# Patient Record
Sex: Male | Born: 1935 | Race: Black or African American | Hispanic: No | Marital: Married | Smoking: Former smoker
Health system: Southern US, Community
[De-identification: ages and names within clinical notes are randomized; demographics above are authoritative.]

## PROBLEM LIST (undated history)

## (undated) DIAGNOSIS — E119 Type 2 diabetes mellitus without complications: Secondary | ICD-10-CM

## (undated) DIAGNOSIS — F0390 Unspecified dementia without behavioral disturbance: Secondary | ICD-10-CM

## (undated) DIAGNOSIS — Y92009 Unspecified place in unspecified non-institutional (private) residence as the place of occurrence of the external cause: Secondary | ICD-10-CM

## (undated) DIAGNOSIS — I10 Essential (primary) hypertension: Secondary | ICD-10-CM

## (undated) DIAGNOSIS — R413 Other amnesia: Secondary | ICD-10-CM

## (undated) DIAGNOSIS — G959 Disease of spinal cord, unspecified: Secondary | ICD-10-CM

## (undated) DIAGNOSIS — E785 Hyperlipidemia, unspecified: Secondary | ICD-10-CM

## (undated) DIAGNOSIS — I2510 Atherosclerotic heart disease of native coronary artery without angina pectoris: Secondary | ICD-10-CM

## (undated) DIAGNOSIS — W19XXXA Unspecified fall, initial encounter: Secondary | ICD-10-CM

## (undated) HISTORY — DX: Unspecified dementia, unspecified severity, without behavioral disturbance, psychotic disturbance, mood disturbance, and anxiety: F03.90

## (undated) HISTORY — PX: CATARACT EXTRACTION, BILATERAL: SHX1313

## (undated) HISTORY — DX: Other amnesia: R41.3

## (undated) HISTORY — DX: Essential (primary) hypertension: I10

## (undated) HISTORY — DX: Disease of spinal cord, unspecified: G95.9

## (undated) HISTORY — DX: Type 2 diabetes mellitus without complications: E11.9

## (undated) HISTORY — DX: Atherosclerotic heart disease of native coronary artery without angina pectoris: I25.10

## (undated) HISTORY — DX: Hyperlipidemia, unspecified: E78.5

---

## 2001-06-24 ENCOUNTER — Emergency Department (HOSPITAL_COMMUNITY): Admission: EM | Admit: 2001-06-24 | Discharge: 2001-06-24 | Payer: Self-pay | Admitting: Emergency Medicine

## 2001-06-24 ENCOUNTER — Encounter: Payer: Self-pay | Admitting: Emergency Medicine

## 2003-03-13 ENCOUNTER — Inpatient Hospital Stay (HOSPITAL_COMMUNITY): Admission: EM | Admit: 2003-03-13 | Discharge: 2003-03-15 | Payer: Self-pay | Admitting: Emergency Medicine

## 2003-03-13 ENCOUNTER — Encounter: Payer: Self-pay | Admitting: Cardiovascular Disease

## 2004-04-30 ENCOUNTER — Ambulatory Visit: Payer: Self-pay | Admitting: Cardiology

## 2004-05-06 ENCOUNTER — Ambulatory Visit: Payer: Self-pay | Admitting: Cardiology

## 2004-10-16 ENCOUNTER — Ambulatory Visit: Payer: Self-pay | Admitting: Cardiology

## 2004-10-20 ENCOUNTER — Ambulatory Visit: Payer: Self-pay | Admitting: Cardiology

## 2004-11-27 ENCOUNTER — Ambulatory Visit: Payer: Self-pay | Admitting: Cardiology

## 2005-06-08 ENCOUNTER — Ambulatory Visit: Payer: Self-pay | Admitting: Cardiology

## 2005-12-25 ENCOUNTER — Ambulatory Visit: Payer: Self-pay | Admitting: Cardiology

## 2005-12-29 ENCOUNTER — Ambulatory Visit: Payer: Self-pay | Admitting: Cardiology

## 2006-06-16 ENCOUNTER — Ambulatory Visit: Payer: Self-pay | Admitting: Cardiology

## 2006-08-27 ENCOUNTER — Ambulatory Visit (HOSPITAL_COMMUNITY): Admission: RE | Admit: 2006-08-27 | Discharge: 2006-08-27 | Payer: Self-pay | Admitting: Ophthalmology

## 2006-12-01 ENCOUNTER — Encounter (INDEPENDENT_AMBULATORY_CARE_PROVIDER_SITE_OTHER): Payer: Self-pay | Admitting: Ophthalmology

## 2006-12-01 ENCOUNTER — Ambulatory Visit (HOSPITAL_BASED_OUTPATIENT_CLINIC_OR_DEPARTMENT_OTHER): Admission: RE | Admit: 2006-12-01 | Discharge: 2006-12-01 | Payer: Self-pay | Admitting: Ophthalmology

## 2006-12-15 ENCOUNTER — Ambulatory Visit: Payer: Self-pay | Admitting: Cardiology

## 2006-12-22 ENCOUNTER — Ambulatory Visit: Payer: Self-pay | Admitting: Cardiology

## 2006-12-22 LAB — CONVERTED CEMR LAB
ALT: 24 units/L (ref 0–53)
Bilirubin, Direct: 0.1 mg/dL (ref 0.0–0.3)
Cholesterol: 143 mg/dL (ref 0–200)
HDL: 35.2 mg/dL — ABNORMAL LOW (ref >39.0)
LDL Cholesterol: 93 mg/dL (ref 0–99)
Total CHOL/HDL Ratio: 4.1
Total Protein: 7.2 g/dL (ref 6.0–8.3)
Triglycerides: 75 mg/dL (ref 0–149)

## 2007-06-23 ENCOUNTER — Ambulatory Visit: Payer: Self-pay | Admitting: Cardiology

## 2007-12-15 ENCOUNTER — Ambulatory Visit: Payer: Self-pay | Admitting: Cardiology

## 2008-04-20 ENCOUNTER — Ambulatory Visit: Payer: Self-pay | Admitting: Cardiology

## 2008-05-15 ENCOUNTER — Emergency Department (HOSPITAL_COMMUNITY): Admission: EM | Admit: 2008-05-15 | Discharge: 2008-05-15 | Payer: Self-pay | Admitting: Family Medicine

## 2008-06-05 ENCOUNTER — Encounter: Admission: RE | Admit: 2008-06-05 | Discharge: 2008-06-05 | Payer: Self-pay | Admitting: Family Medicine

## 2008-06-07 ENCOUNTER — Encounter: Admission: RE | Admit: 2008-06-07 | Discharge: 2008-07-31 | Payer: Self-pay | Admitting: Family Medicine

## 2008-07-23 ENCOUNTER — Telehealth: Payer: Self-pay | Admitting: Cardiology

## 2008-08-09 ENCOUNTER — Telehealth (INDEPENDENT_AMBULATORY_CARE_PROVIDER_SITE_OTHER): Payer: Self-pay

## 2008-08-13 ENCOUNTER — Ambulatory Visit: Payer: Self-pay | Admitting: Cardiology

## 2008-08-13 DIAGNOSIS — I2510 Atherosclerotic heart disease of native coronary artery without angina pectoris: Secondary | ICD-10-CM | POA: Insufficient documentation

## 2008-08-17 DIAGNOSIS — E119 Type 2 diabetes mellitus without complications: Secondary | ICD-10-CM | POA: Insufficient documentation

## 2008-08-17 LAB — CONVERTED CEMR LAB
ALT: 20 units/L (ref 0–53)
AST: 18 units/L (ref 0–37)
Calcium: 9 mg/dL (ref 8.4–10.5)
Cholesterol: 134 mg/dL (ref 0–200)
GFR calc non Af Amer: 106.65 mL/min (ref >60)
Glucose, Bld: 168 mg/dL — ABNORMAL HIGH (ref 70–99)
HDL: 46.5 mg/dL (ref >39.00)
Sodium: 142 meq/L (ref 135–145)
Total Bilirubin: 0.7 mg/dL (ref 0.3–1.2)
Total Protein: 6.7 g/dL (ref 6.0–8.3)
Triglycerides: 53 mg/dL (ref 0.0–149.0)

## 2008-08-18 DIAGNOSIS — I10 Essential (primary) hypertension: Secondary | ICD-10-CM | POA: Insufficient documentation

## 2008-08-18 DIAGNOSIS — E785 Hyperlipidemia, unspecified: Secondary | ICD-10-CM | POA: Insufficient documentation

## 2008-08-18 DIAGNOSIS — Z9849 Cataract extraction status, unspecified eye: Secondary | ICD-10-CM

## 2008-08-18 DIAGNOSIS — Z961 Presence of intraocular lens: Secondary | ICD-10-CM

## 2008-08-20 ENCOUNTER — Ambulatory Visit: Payer: Self-pay | Admitting: Cardiology

## 2008-11-07 ENCOUNTER — Telehealth: Payer: Self-pay | Admitting: Cardiology

## 2008-12-05 ENCOUNTER — Telehealth (INDEPENDENT_AMBULATORY_CARE_PROVIDER_SITE_OTHER): Payer: Self-pay | Admitting: *Deleted

## 2009-02-12 ENCOUNTER — Ambulatory Visit: Payer: Self-pay | Admitting: Cardiology

## 2009-07-23 ENCOUNTER — Telehealth: Payer: Self-pay | Admitting: Cardiology

## 2009-08-13 ENCOUNTER — Ambulatory Visit: Payer: Self-pay | Admitting: Cardiology

## 2009-08-20 ENCOUNTER — Ambulatory Visit: Payer: Self-pay | Admitting: Cardiology

## 2009-08-27 LAB — CONVERTED CEMR LAB
AST: 17 units/L (ref 0–37)
Albumin: 3.8 g/dL (ref 3.5–5.2)
Alkaline Phosphatase: 106 units/L (ref 39–117)
Calcium: 9.4 mg/dL (ref 8.4–10.5)
Chloride: 105 meq/L (ref 96–112)
Cholesterol: 132 mg/dL (ref 0–200)
Creatinine, Ser: 0.9 mg/dL (ref 0.4–1.5)
LDL Cholesterol: 76 mg/dL (ref 0–99)
Sodium: 143 meq/L (ref 135–145)
Total Protein: 7 g/dL (ref 6.0–8.3)
Triglycerides: 80 mg/dL (ref 0.0–149.0)

## 2010-02-25 ENCOUNTER — Ambulatory Visit: Payer: Self-pay | Admitting: Cardiology

## 2010-05-13 NOTE — Progress Notes (Signed)
Summary: Cough--pt not aware that daughter called  Phone Note Call from Patient Call back at [phone]   Caller: Daughter/[name] Reason for Call: Talk to Doctor Summary of Call: request to speak to Dr Riley Kill on the pts next step of care Initial call taken by: Migdalia Dk,  July 23, 2009 3:56 PM  Follow-up for Phone Call        The pt's daughter called our office because she is concerned about her dad's cough.  The pt has a cough that has continued to worsen.  Sometimes the pt coughs so bad that he sounds like he is vomiting.  The pt is not aware that his daughter called our office about this information.  I advised her that I cannot discuss this pt's medical condition with her.  I recommended that she speak with her dad and see is she can get him to schedule an appt with his PCP (Dr Blunt).  She agreed with plan. Follow-up by: Julieta Gutting, RN, BSN,  July 23, 2009 5:20 PM

## 2010-05-13 NOTE — Assessment & Plan Note (Signed)
Summary: f63m   Visit Type:  6 months follow up Primary Provider:  Blunt  CC:  No complaints.  History of Present Illness: Accompanied by wife.  She is concerned.  He on the other hand says that overall everything is good.  Denies chest pain.  No arm pain.  No exertional pressure.    Problems Prior to Update: 1)  Diab w/o Comp Type I [juv] Not Stated Uncntrl  (ICD-250.01) 2)  Myocardial Infarction, Acute, Non-q Wave, Hx of  (ICD-410.70) 3)  Hypertension  (ICD-401.9) 4)  Intraocular Lens Implant, Hx of  (ICD-V43.1) 5)  Cataract Extraction, Right Eye, Hx of  (ICD-V45.61) 6)  Cyst Excision Left Eye, Hx of  () 7)  Diabetes Mellitus  (ICD-250.00) 8)  Hyperlipidemia  (ICD-272.4) 9)  Coronary Atherosclerosis Native Coronary Artery  (ICD-414.01) 10)  Encounter For Long-term Use of Other Medications  (ICD-V58.69)  Current Medications (verified): 1)  Diovan 80 Mg Tabs (Valsartan) .... Take 1 Tablet By Mouth Once A Day 2)  Lipitor 80 Mg Tabs (Atorvastatin Calcium) .... Take 1 Tablet By Mouth Once A Day 3)  Zetia 10 Mg Tabs (Ezetimibe) .... Take 1 Tablet By Mouth Once A Day 4)  Aspirin 81 Mg Tabs (Aspirin) .... Take 1 Tablet By Mouth Once A Day 5)  Humalog Mix 75/25 Pen 75-25 % Susp (Insulin Lispro Prot & Lispro) .... Take As Directed Two Times A Day 6)  Metoprolol Tartrate 50 Mg Tabs (Metoprolol Tartrate) .... Take One Tablet By Mouth Twice A Day 7)  Plavix 75 Mg Tabs (Clopidogrel Bisulfate) .... Take One Tablet By Mouth Daily  Allergies (verified): No Known Drug Allergies  Past History:  Past Medical History: Last updated: 09/01/2008 MYOCARDIAL INFARCTION, ACUTE, NON-Q WAVE, HX OF (ICD-410.70) HYPERTENSION (ICD-401.9) DIABETES MELLITUS (ICD-250.00) HYPERLIPIDEMIA (ICD-272.4) CORONARY ATHEROSCLEROSIS NATIVE CORONARY ARTERY (ICD-414.01) ENCOUNTER FOR LONG-TERM USE OF OTHER MEDICATIONS (ICD-V58.69)    Past Surgical History: Last updated: 09-01-2008 INTRAOCULAR LENS IMPLANT, HX OF  (ICD-V43.1) CATARACT EXTRACTION, RIGHT EYE, HX OF (ICD-V45.61) * CYST EXCISION LEFT EYE, HX OF  Family History: Last updated: 09-01-08 Father:Died at age 28 unknown cause Mother:Died at 79 patient unsure of cause Siblings: 1 sister deceased in 72's of lung cancer ! sister living has hypertension  Vital Signs:  Patient profile:   76 year old male Height:      68 inches Weight:      222 pounds BMI:     33.88 Pulse rate:   68 / minute Pulse rhythm:   regular Resp:     18 per minute BP sitting:   140 / 80  (right arm) Cuff size:   large  Vitals Entered By: Vikki Ports (February 25, 2010 4:36 PM)  Physical Exam  General:  Well developed, well nourished, in no acute distress. Head:  normocephalic and atraumatic Eyes:  PERRLA/EOM intact; conjunctiva and lids normal. Lungs:  Clear bilaterally to auscultation and percussion. Heart:  PMI non displaced.  Normal S1 and S2.   Msk:  Back normal, normal gait. Muscle strength and tone normal. Pulses:  pulses normal in all 4 extremities Extremities:  No clubbing or cyanosis. Neurologic:  Alert and oriented x 3.   EKG  Procedure date:  02/25/2010  Findings:      NSR.  Nonspecific T wave abnormality.   Impression & Recommendations:  Problem # 1:  CORONARY ATHEROSCLEROSIS NATIVE CORONARY ARTERY (ICD-414.01) Seems to be stable overall.  Doing well.  Will continue to monitor.  He has been instructed to  exercise more.   His updated medication list for this problem includes:    Aspirin 81 Mg Tabs (Aspirin) .Marland Kitchen... Take 1 tablet by mouth once a day    Metoprolol Tartrate 50 Mg Tabs (Metoprolol tartrate) .Marland Kitchen... Take one tablet by mouth twice a day    Plavix 75 Mg Tabs (Clopidogrel bisulfate) .Marland Kitchen... Take one tablet by mouth daily  Problem # 2:  HYPERLIPIDEMIA (ICD-272.4) Continue current medical regimen.  His updated medication list for this problem includes:    Lipitor 80 Mg Tabs (Atorvastatin calcium) .Marland Kitchen... Take 1 tablet by mouth  once a day    Zetia 10 Mg Tabs (Ezetimibe) .Marland Kitchen... Take 1 tablet by mouth once a day  Problem # 3:  HYPERTENSION (ICD-401.9) under control at present time. His updated medication list for this problem includes:    Diovan 80 Mg Tabs (Valsartan) .Marland Kitchen... Take 1 tablet by mouth once a day    Aspirin 81 Mg Tabs (Aspirin) .Marland Kitchen... Take 1 tablet by mouth once a day    Metoprolol Tartrate 50 Mg Tabs (Metoprolol tartrate) .Marland Kitchen... Take one tablet by mouth twice a day  Patient Instructions: 1)  Your physician recommends that you schedule a follow-up appointment in: 6 MONTHS

## 2010-05-13 NOTE — Assessment & Plan Note (Signed)
Summary: f59m   Visit Type:  6 months follow up Primary Provider:  Blunt  CC:  No cardiac complains.  History of Present Illness: Patient thinks he is doing fine.  Not playing any golf.  No chest pain.  Just finished tax season.    Current Medications (verified): 1)  Diovan 80 Mg Tabs (Valsartan) .... Take 1 Tablet By Mouth Once A Day 2)  Lipitor 80 Mg Tabs (Atorvastatin Calcium) .... Take 1 Tablet By Mouth Once A Day 3)  Zetia 10 Mg Tabs (Ezetimibe) .... Take 1 Tablet By Mouth Once A Day 4)  Aspirin 81 Mg Tabs (Aspirin) .... Take 2 Tablets Daily 5)  Humalog Mix 75/25 Pen 75-25 % Susp (Insulin Lispro Prot & Lispro) .... Take As Directed Two Times A Day 6)  Metoprolol Tartrate 50 Mg Tabs (Metoprolol Tartrate) .... Take One Tablet By Mouth Twice A Day 7)  Plavix 75 Mg Tabs (Clopidogrel Bisulfate) .... Take One Tablet By Mouth Daily  Allergies (verified): No Known Drug Allergies  Past History:  Past Medical History: Last updated: 08/17/2008 MYOCARDIAL INFARCTION, ACUTE, NON-Q WAVE, HX OF (ICD-410.70) HYPERTENSION (ICD-401.9) DIABETES MELLITUS (ICD-250.00) HYPERLIPIDEMIA (ICD-272.4) CORONARY ATHEROSCLEROSIS NATIVE CORONARY ARTERY (ICD-414.01) ENCOUNTER FOR LONG-TERM USE OF OTHER MEDICATIONS (ICD-V58.69)    Past Surgical History: Last updated: 08/17/2008 INTRAOCULAR LENS IMPLANT, HX OF (ICD-V43.1) CATARACT EXTRACTION, RIGHT EYE, HX OF (ICD-V45.61) * CYST EXCISION LEFT EYE, HX OF  Vital Signs:  Patient profile:   76 year old male Height:      68 inches Weight:      217 pounds BMI:     33.11 Pulse rate:   68 / minute Pulse rhythm:   regular Resp:     18 per minute BP sitting:   116 / 64  (left arm) Cuff size:   large  Vitals Entered By: Vikki Ports (Aug 13, 2009 3:40 PM)  Physical Exam  General:  Well developed, well nourished, in no acute distress. Head:  normocephalic and atraumatic Eyes:  PERRLA/EOM intact; conjunctiva and lids normal. Lungs:  Clear  bilaterally to auscultation and percussion. Heart:  PMI non displaced.  Normal S1 and S2.  S4 gallop.   Abdomen:  Bowel sounds positive; abdomen soft and non-tender without masses, organomegaly, or hernias noted. No hepatosplenomegaly. Extremities:  No clubbing or cyanosis.  No edema. Neurologic:  Alert and oriented x 3.   EKG  Procedure date:  08/13/2009  Findings:      NSR.  Nonspecific T abnormality.  Impression & Recommendations:  Problem # 1:  CORONARY ATHEROSCLEROSIS NATIVE CORONARY ARTERY (ICD-414.01) Has underlying diabetes and difffuse disease noted in 2004.  Distal circ occluded, and scattered LAD disease.  Control of risk factors, and treatment of DM discussed in relation to disease progression.  Reinforced.  His updated medication list for this problem includes:    Aspirin 81 Mg Tabs (Aspirin) .Marland Kitchen... Take 2 tablets daily    Metoprolol Tartrate 50 Mg Tabs (Metoprolol tartrate) .Marland Kitchen... Take one tablet by mouth twice a day    Plavix 75 Mg Tabs (Clopidogrel bisulfate) .Marland Kitchen... Take one tablet by mouth daily  Orders: EKG w/ Interpretation (93000)  Problem # 2:  HYPERTENSION (ICD-401.9) currently controlled.  Appropriate medications at present.  controlled at present time.   His updated medication list for this problem includes:    Diovan 80 Mg Tabs (Valsartan) .Marland Kitchen... Take 1 tablet by mouth once a day    Aspirin 81 Mg Tabs (Aspirin) .Marland Kitchen... Take 2 tablets daily  Metoprolol Tartrate 50 Mg Tabs (Metoprolol tartrate) .Marland Kitchen... Take one tablet by mouth twice a day  Orders: EKG w/ Interpretation (93000)  Problem # 3:  DIABETES MELLITUS (ICD-250.00)  has not had HgbA1c, and not sure of control.  Suggested he had an A1c checked to assess long term control.  Patient says his primary wanted him to have labs here.  His updated medication list for this problem includes:    Diovan 80 Mg Tabs (Valsartan) .Marland Kitchen... Take 1 tablet by mouth once a day    Aspirin 81 Mg Tabs (Aspirin) .Marland Kitchen... Take 2  tablets daily    Humalog Mix 75/25 Pen 75-25 % Susp (Insulin lispro prot & lispro) .Marland Kitchen... Take as directed two times a day  Orders: EKG w/ Interpretation (93000)  Problem # 4:  HYPERLIPIDEMIA (ICD-272.4) Needs lipid and liver profile for reevaluation. needs lipid and liver profile.  His updated medication list for this problem includes:    Lipitor 80 Mg Tabs (Atorvastatin calcium) .Marland Kitchen... Take 1 tablet by mouth once a day    Zetia 10 Mg Tabs (Ezetimibe) .Marland Kitchen... Take 1 tablet by mouth once a day  Orders: EKG w/ Interpretation (93000)  Patient Instructions: 1)  Your physician recommends that you return for a FASTING LIPID, LIVER, BMP, HgbA1C (nothing to eat or drink after midnight) 414.01, 401.9, 250.0--lab opens at 8:30  2)  Your physician recommends that you continue on your current medications as directed. Please refer to the Current Medication list given to you today. 3)  Your physician wants you to follow-up in:   6 MONTHS. You will receive a reminder letter in the mail two months in advance. If you don't receive a letter, please call our office to schedule the follow-up appointment. Prescriptions: DIOVAN 80 MG TABS (VALSARTAN) Take 1 tablet by mouth once a day  #30 x 12   Entered by:   Julieta Gutting, RN, BSN   Authorized by:   Ronaldo Miyamoto, MD, [facility]   Signed by:   Julieta Gutting, RN, BSN on 08/13/2009   Method used:   Electronically to        [facility] [phone]* (retail)       [address]       [place], [place]  [address]       Ph: [phone]       Fax: [phone]   RxID:   [id number] LIPITOR 80 MG TABS (ATORVASTATIN CALCIUM) Take 1 tablet by mouth once a day  #30 x 12   Entered by:   Julieta Gutting, RN, BSN   Authorized by:   Ronaldo Miyamoto, MD, [facility]   Signed by:   Julieta Gutting, RN, BSN on 08/13/2009   Method used:   Electronically to        [facility] [phone]* (retail)       [address]       [place], [place]  [address]       Ph:  [phone]       Fax: [phone]   RxID:   [id number] ZETIA 10 MG TABS (EZETIMIBE) Take 1 tablet by mouth once a day  #30 x 12   Entered by:   Julieta Gutting, RN, BSN   Authorized by:   Ronaldo Miyamoto, MD, [facility]   Signed by:   Julieta Gutting, RN, BSN on 08/13/2009   Method used:   Electronically to        [facility] [phone]* (retail)  [address]       [place], [place]  [address]       Ph: [phone]       Fax: [phone]   RxID:   [id number] METOPROLOL TARTRATE 50 MG TABS (METOPROLOL TARTRATE) Take one tablet by mouth twice a day  #60 x 12   Entered by:   Julieta Gutting, RN, BSN   Authorized by:   Ronaldo Miyamoto, MD, [facility]   Signed by:   Julieta Gutting, RN, BSN on 08/13/2009   Method used:   Electronically to        [facility] [phone]* (retail)       [address]       [place], [place]  [address]       Ph: [phone]       Fax: [phone]   RxID:   [id number]

## 2010-07-05 ENCOUNTER — Other Ambulatory Visit: Payer: Self-pay | Admitting: Cardiology

## 2010-07-10 ENCOUNTER — Other Ambulatory Visit: Payer: Self-pay | Admitting: *Deleted

## 2010-07-10 MED ORDER — CLOPIDOGREL BISULFATE 75 MG PO TABS: 75.0000 mg | ORAL_TABLET | Freq: Every day | ORAL | Status: DC

## 2010-07-10 MED ORDER — VALSARTAN 80 MG PO TABS: 80.0000 mg | ORAL_TABLET | Freq: Every day | ORAL | Status: DC

## 2010-07-10 NOTE — Telephone Encounter (Signed)
[address] °

## 2010-08-25 ENCOUNTER — Other Ambulatory Visit: Payer: Self-pay | Admitting: Cardiology

## 2010-08-26 NOTE — Op Note (Signed)
NAME:  William Aguirre, William Aguirre NO.:  [id number]   MEDICAL RECORD NO.:  [id number]          PATIENT TYPE:  AMB   LOCATION:  [facility]                          FACILITY:  [facility]   PHYSICIAN:  Salley Scarlet., M.D.DATE OF BIRTH:  07-02-1934   DATE OF PROCEDURE:  12/01/2006  DATE OF DISCHARGE:  12/01/2006                               OPERATIVE REPORT   PREOPERATIVE DIAGNOSIS:  Multiple cysts upper lid left eye.   POSTOPERATIVE DIAGNOSIS:  Multiple cysts upper lid left eye.   OPERATION:  Cyst excision upper lid left eye.   ANESTHESIA:  Local using Xylocaine 1%.   PROCEDURE:  The patient was brought to the minor surgery room and the  upper and the patient was prepped and draped in the usual fashion.  The  upper lid was inspected and there were two cysts located along the  lateral aspect of the upper lid of the left eye.  The smaller cyst  measuring approximately 5 x 5 mm and the larger cyst measuring  approximately 2 cm x 20 mm x 8 mm.  The upper lid was infiltrated  several milliliters of Xylocaine.  The larger cyst was then incised in  toto by using sharp and blunt dissection.  The specimen was submitted  for pathology and the smaller cyst was then excised in the same fashion.  The skin was reapproximated by using interrupted sutures of 6-0 silk.  Polysporin ophthalmic ointment.  A pressure patch was applied.  The  patient tolerated the procedure well and was discharged to the post  anesthesia recovery satisfactory condition.  He was instructed to take  Darvocet every 4 hours as needed for pain, to remove the patch tomorrow,  to begin Polysporin ophthalmic ointment to the suture site, to see me in  the office in week for suture removal.  Discharged diagnosis:  Multiple  cysts upper lid left eye.      Salley Scarlet., M.D.  Electronically Signed     TB/MEDQ  D:  12/02/2006  T:  12/03/2006  Job:  [id number]

## 2010-08-26 NOTE — Assessment & Plan Note (Signed)
[facility]                            CARDIOLOGY OFFICE NOTE   William Aguirre, William Aguirre                      MRN:          [id number]  DATE:06/23/2007                            DOB:          09-18-1934    HISTORY:  Mr. Speciale is in for followup.  In general, he is doing well.  He denies any chest pain.  He is not exercising at all however.  He has  been working mostly in his [employer] business.   MEDICATIONS:  1. Baby aspirin 81 mg 2 tablets daily.  2. Plavix 75 mg daily.  3. Humalog in the morning and evening.  4. Diovan 80 mg daily.  5. Lipitor 80 mg q.h.s.  6. Zetia 10 mg q.h.s.  7. Metoprolol tartrate 50 mg p.o. b.i.d.   LABORATORY DATA:  His most recent laboratory studies were in September  of this year at which time his liver functions were normal.  His  cholesterol was 143 with an LDL of 93 and HDL of 35.   PHYSICAL EXAMINATION:  GENERAL:  He is alert and oriented.  VITAL SIGNS:  Weight is 224 pounds.  Blood pressure 138/82 and pulse is  64.  LUNGS:  Fields are clear to auscultation and percussion.  HEART:  His PMI is nondisplaced and there is no significant murmur  noted.  EXTREMITIES:  Reveal no edema.   DIAGNOSTICS:  Electrocardiogram demonstrates normal sinus rhythm with  minor nonspecific T-wave flattening.   IMPRESSION:  1. Known mild reduction in left ventricular function with an      inferobasal wall motion abnormality and total occlusion of the      distal circumflex prior to the posterior descending branch with      early collateralization.  2. Typical diabetic appearance of the coronary arteries.  3. Insulin-dependent diabetes mellitus.  4. Hypertension  5. Hyperlipidemia.   PLAN:  1. Return to clinic in 6 months.  2. Continue current medical regimen.  3. We discussed with him today goals with regard to both weight      reduction and exercise.     Arturo Morton. Riley Kill, MD, [facility]  Electronically Signed    TDS/MedQ   DD: 06/23/2007  DT: 06/25/2007  Job #: [id number]   cc:   A.V. Bruna Potter, MD

## 2010-08-26 NOTE — Assessment & Plan Note (Signed)
[facility]                            CARDIOLOGY OFFICE NOTE   William Aguirre, VINEY                      MRN:          [id number]  DATE:12/15/2007                            DOB:          11-11-1934    [name] is in for followup.  In general, he is doing well.  He does  a high level of exercise.  He gets little bit of angina, but he has  generally avoided any type of exercise.  He has 2 treadmills at home  which he virtually does not use.  His son tried to encourage him to use  it.  If he could gets some groceries and carries 2 heavy sacks to his  house.  He will get little bit of tightness.  He was not been in  advancement and discomfort, however.  He has had a hemoglobin A1c he  believes, but he has no idea what it is.   CURRENT MEDICATIONS:  1. Aspirin 81 mg 2 tablets daily.  2. Plavix 75 mg daily.  3. Diovan 80 mg daily.  4. Lipitor 80 mg at bedtime.  5. Zetia 10 mg at bedtime.  6. Metoprolol tartrate 50 mg p.o. b.i.d.  7. Humalog.   PHYSICAL EXAMINATION:  GENERAL:  He is alert and oriented.  VITAL SIGNS:  Blood pressure is 120/80, pulse is 66.  LUNGS:  Lung fields are clear.  CARDIAC:  Rhythm is regular.  He does not have a significant murmur.   EKG reveals normal sinus rhythm and minor nonspecific T-wave flattening.   IMPRESSION:  1. Scattered coronary artery disease typical of diabetes mellitus.  2. Insulin-dependent diabetes.  3. Hypertension.  4. Hyperlipidemia near, but not at target.   RECOMMENDATIONS:  1. Continue current medical regimen.  2. Continued followup of his lipid profile in our office in 6 months.   As an addendum, I talked to him at some length today about weight loss.     Arturo Morton. Riley Kill, MD, [facility]  Electronically Signed    TDS/MedQ  DD: 12/15/2007  DT: 12/16/2007  Job #: [id number]

## 2010-08-26 NOTE — Assessment & Plan Note (Signed)
[facility]                            CARDIOLOGY OFFICE NOTE   William Aguirre, William Aguirre                      MRN:          [id number]  DATE:12/15/2006                            DOB:          Aug 28, 1934    [name] is in for followup.  He is stable.  He has not been having  any ongoing chest pain or shortness of breath.  He really feels quite  well.  Overall, he has no specific complaints.   On physical examination today, the blood pressure is 132/72, the pulse  is 72.  The lung fields are clear, cardiac rhythm is regular, the  extremities reveal no edema.   The EKG reveals normal sinus rhythm, nonspecific T-wave abnormality.   IMPRESSION:  1. Coronary artery disease.  2. Insulin-requiring diabetes mellitus.  3. Hypercholesterolemia.   PLAN:  1. Return to clinic in 6 months.  2. Continue to encourage walking.  3. Lipid and liver profile at this time.     Arturo Morton. Riley Kill, MD, [facility]  Electronically Signed    TDS/MedQ  DD: 12/15/2006  DT: 12/15/2006  Job #: [id number]

## 2010-08-26 NOTE — Assessment & Plan Note (Signed)
[facility]                            CARDIOLOGY OFFICE NOTE   CLAIR, ALFIERI                      MRN:          [id number]  DATE:04/20/2008                            DOB:          05-12-1934    Mr. Woodmansee is in for followup.  He really is doing quite well from a  clinical cardiac standpoint.  He denies any chest pain or shortness of  breath.  He and I had an extensive discussion today regarding niacin,  Zetia, and Lipitor in light of recent trial results.  We also had an  extensive discussion about his weight, and dietary habits.   CURRENT MEDICATIONS:  1. Aspirin 81 mg 2 tablets daily.  2. Plavix 75 mg 1 daily.  3. Diovan 80 mg daily.  4. Lipitor 80 mg nightly.  5. Zetia 10 mg daily.  6. Metoprolol tartrate 50 mg p.o. b.i.d.  7. Humalog as directed.   PHYSICAL EXAMINATION:  GENERAL:  He is alert and oriented.  VITAL SIGNS:  Blood pressure was 160/80 and repeat was 150/80.  The  pulse is 64.  LUNGS:  The lung fields are clear.  CARDIAC:  Rhythm is regular.  Soft S4 gallop.   Electrocardiogram demonstrates normal sinus rhythm, nonspecific T-wave  abnormality.   IMPRESSION:  1. Insulin-dependent diabetes mellitus.  2. Well-defined coronary artery disease.  3. Hypercholesterolemia, on lipid-lowering therapy with LDL of 93 on      combination LDL-lowering treatment.  4. Moderate obesity.   DISPOSITION:  1. Extensive discussion about multiple drugs today and potential      options and treatment modalities.  2. Return to clinic in 6 months.  3. Lipid and liver profile.     Arturo Morton. Riley Kill, MD, [facility]  Electronically Signed    TDS/MedQ  DD: 04/20/2008  DT: 04/21/2008  Job #: [id number]

## 2010-08-29 NOTE — Discharge Summary (Signed)
NAME:  William Aguirre, William Aguirre NO.:  [id number]   MEDICAL RECORD NO.:  [id number]                   PATIENT TYPE:  INP   LOCATION:  4731                                 FACILITY:  [facility]   PHYSICIAN:  Norwood Levo, MD               DATE OF BIRTH:  1934-10-30   DATE OF ADMISSION:  03/12/2003  DATE OF DISCHARGE:  03/15/2003                                 DISCHARGE SUMMARY   PRIMARY CARE PHYSICIAN:  Clyda Greener, M.D.   Corinda Gubler CARDIOLOGIST:  Dr. Shawnie Pons.   CHIEF COMPLAINT:  Substernal chest pain, nausea, vomiting, and diarrhea.   HISTORY OF PRESENT ILLNESS:  A 76 year old African-American male with past  medical history significant for type 2 diabetes and hyperlipidemia.  Presented to the [facility] emergency department on November 29 with chief  complaint of substernal chest pressure. The patient had gone out the evening  previously and had some clam chowder and tossed salad. The afternoon of  November 24, he began to have some chest pain located in his epigastric  area. He belched multiple times and felt that the discomfort relieved. He  went to work the following day and had no complaints. Later that night, he  had several bouts of diarrhea, suffered nausea and vomiting. Later that  evening prior to admission, the chest pain returned. He awakened after going  to the bathroom and had another bout of diarrhea and more nausea and  vomiting, this time associated with substernal chest pain. Felt also  dyspepsia with what he called burning pain which he described as 4/10. He  had this intermittently over the period of 12 hours. Had felt that this was  probably indigestion but without resolution. EMS was called, and the  patient's was taken to the emergency room for further evaluation. Upon  arrival, he was chest pain free. He was placed on nitroglycerin drip and was  given a GI cocktail with resolution of his symptomatology.   PAST MEDICAL HISTORY:  1.  Diabetes type 2, insulin dependent, diagnosed in 1986.  2. Hyperlipidemia.   MEDICATIONS ON ADMISSION:  1. Humalog insulin 75/25 20 to 30 units subcu q.a.m. only.  2. Lipitor 20 mg p.o. q.h.s.  3. Multivitamin milkshake twice a day.   ALLERGIES:  No known drug allergies.   FAMILY HISTORY:  Father deceased at age 61 of unknown reasons. No history of  heart disease, stroke, or heart failure. Mother deceased at age 55 with no  cardiac history either. He has two sisters who have history of hypertension  and osteoarthritis and a third sister who died in her 59s secondary to lung  cancer.   SOCIAL HISTORY:  The patient is married, lives in [place], has four  children. He is a full time Airline pilot. Remote history of tobacco use for  about 30 years. Stopped drinking alcohol about 15 years ago.   REVIEW OF SYSTEMS:  Negative for abdominal pain, fevers, chills,  hematemesis, melena, hematochezia, dysuria, rash, swelling of the legs,  headache, weight loss, fevers, chills, nausea, or vomiting; other than what  is described in HPI.   PHYSICAL EXAMINATION:  On admission, as per Dr. Theodis Aguas HPE and is notable  only for obese abdomen without hepatosplenomegaly or masses. Exam is  otherwise normal.   LABORATORY EXAM AND DIAGNOSTIC TESTING:  EKG:  Normal sinus rhythm.  Nonspecific T-wave abnormality. Chest x-ray reveals bilateral atelectasis.  Quick cardiac enzyme sets done in the emergency room showed negative  troponins, CK-MBs, and myoglobins. WBC 8.9, hemoglobin 14.2, platelets 208.   ASSESSMENT/PLAN:  1. Cardiovascular. The patient is placed on telemetry. Has serial cardiac     enzymes continued which were found to be positive within 12 hours of     first evaluation. These were found to peak at his troponin of 3.43, CK at     559, and MB fraction at 41.0. Dr. Riley Kill from [facility] cardiology was     asked to evaluate patient. The patient is placed on Plavix, aspirin,     nitro  paste, intravenous fluids, and beta blockade. He is evaluated with     echocardiogram and shows an EF of 55 to 65% with no wall motion     abnormalities and no valvular disease. Left ventricular systolic function     which is normal and no effusions. Cardiac catheterization which follows     shows multivessel disease with 100% obstruction of the PDA and a range of     30 to 50% obstruction in the other major vessels. The patient was not     stented and did not undergo PTCA. There is no indication for CABG at this     time. The patient undergoes Integrilin overnight post cardiac     catheterization and then remains on Plavix, on aspirin, and Lopressor.     Homocysteine levels are normal. Total cholesterol is 133, triglycerides     68, and LDL is 87. HDL is not recorded at this time. Hemoglobin A1c is     not available at this time. The patient's urinalysis is also negative.     Hospital course is otherwise uneventful. The patient's dyspeptic-like     symptomatology is probably anginal equivalent. He is a diabetic. This is     not uncommon. He underwent cardiac rehab evaluation, Phase II. He also     had nutritional consultation for weight loss, Cardiac II diet, as well as     ADA 1,500 calorie diet.  2. Endocrinology. Insulin-dependent diabetes mellitus. The patient's glucose     control was adequate during this hospitalization, and he was continued on     the Humalog insulin as previously.   DISCHARGE DIAGNOSES:  1. Acute coronary syndrome with non-Q myocardial infarction.  2. Possible food poisoning.  3. Insulin-dependent diabetes mellitus.  4. Morbid obesity.  5. Hypertension.  6. Hyperlipidemia.   DISCHARGE MEDICATIONS:  1. Aspirin 81 mg p.o. daily.  2. Lipitor 40 mg p.o. q.h.s.  3. Lisinopril 10 mg p.o. daily.  4. Toprol-XL 100 mg p.o. daily.  5. Plavix 75 mg p.o. daily.  6. Humalog insulin 30 units subcu q.a.m. 7. Multivitamin one tablet p.o. daily.   DIETARY RESTRICTIONS:   Cardiac II diet and ADA 1,500 calorie diet.   FOLLOWUP:  1. Follow up with Dr. Riley Kill in two weeks. Call for appointment.  2. Follow up with Dr. Bruna Potter in two weeks. Call for appointment.  3. Continue to follow up with cardiac rehab as arranged with that service.                                                Norwood Levo, MD    APM/MEDQ  D:  03/15/2003  T:  03/15/2003  Job:  [id number]   cc:   Bruna Potter, M.D.   Arturo Morton. Riley Kill, M.D.

## 2010-08-29 NOTE — Assessment & Plan Note (Signed)
[facility]                              CARDIOLOGY OFFICE NOTE   William Aguirre, William Aguirre                      MRN:          [id number]  DATE:12/25/2005                            DOB:          Feb 12, 1935    Mr. Causer is in for followup.  Clinically he is doing really quite well.  He has not been having any ongoing chest pain or shortness of breath.  Unfortunately he has not been exercising very much.   MEDICATIONS:  His medications include:  1. Baby aspirin 81 mg, 2 tablets daily.  2. Toprol XL 100 mg daily.  3. Plavix 75 mg daily.  4. Humalog a.m. and p.m.  5. Diovan 80 mg daily.  6. Lipitor 80 mg, 1 q.h.s.  7. Zetia 10 mg daily.  8. 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   PHYSICAL EXAMINATION TODAY:  VITAL SIGNS:  Blood pressure 144/84.  Pulse 74.  LUNG FIELDS:  Clear.  CARDIAC EXAMINATION:  Reveals a nondisplaced PMI with normal first and  second heart sounds.  Soft S4.  No murmur.   Electrocardiogram demonstrates normal sinus rhythm.  Nonspecific ST-T  abnormality.  Compared to the previous tracing, there is no significant  interval change from February 2007.   IMPRESSION:  1. Coronary artery disease as demonstrated by previous cardiac      catheterization on March 14, 2003, with mild reduction in overall      left ventricular function with an inferobasal wall motion abnormality.      Total occlusion of distal circumflex prior to the posterior descending      branch with early collateralization and moderate disease of the LAD and      diagonal system with typical diabetic appearance.  2. Insulin-dependent diabetes mellitus.  3. Hypercholesterolemia.   PLAN:  1. Encouraged increased exercise.  2. Continue diabetic control.  3. Return to clinic in six months.  4. Lipid and liver profile.                              Arturo Morton. Riley Kill, MD, [facility]    TDS/MedQ  DD:  12/25/2005  DT:  12/26/2005  Job #:   [id number]

## 2010-08-29 NOTE — H&P (Signed)
NAME:  MAVEN, VARELAS NO.:  [id number]   MEDICAL RECORD NO.:  [id number]                   PATIENT TYPE:  INP   LOCATION:  1824                                 FACILITY:  [facility]   PHYSICIAN:  Elliot Cousin, M.D.                 DATE OF BIRTH:  Jul 26, 1934   DATE OF ADMISSION:  03/13/2003  DATE OF DISCHARGE:                                HISTORY & PHYSICAL   PRIMARY CARE PHYSICIAN:  Dr. Clyda Greener.   CHIEF COMPLAINT:  Substernal chest pain and nausea, vomiting and diarrhea.   HISTORY OF PRESENT ILLNESS:  Mr. Prien is a 76 year old man with a past  medical history significant for type 2 insulin-requiring diabetes mellitus  and hyperlipidemia, who presented to the emergency department on the night  of March 12, 2003 with a chief complaint of substernal chest pressure.  On March 11, 2003, the patient ate some clam chowder and a tossed salad.  During the night of the 28th, the patient began having chest pain which he  says was located in the center of his chest.  He belched a couple of times  and the chest pain went away.  During the following day, the patient went to  work and had no complaints.  However, last night he developed several  episodes of diarrhea and several episodes of nausea and vomiting.  The  nausea and vomiting and diarrhea subsided.  Later on in the night around 10  p.m., the chest pain came back.  This happened after he woke up and went to  the bathroom for another bout of diarrhea and one more episode of nausea and  vomiting.  The chest pain is located in the substernal area/center of his  chest.  He described the pain as burning.  The pain at it worst was a 4/10  in intensity.  It has been intermittent over the past 12 hours.  He does  have some feelings of indigestion and says that if he would just have a big  burp, he feels that the pain would go away.  The EMS arrived to the  patient's home.  During the evaluation by the  EMT, the patient's blood  pressure was 128/54, pulse rate of 80 and respiratory rate of 18.  He was  given three sublingual nitroglycerin and aspirin at 324 mg x1.  The chest  pain went from a 4/10 to a 1/10.  When the patient arrived to the emergency  department, he was virtually chest-pain-free.  The emergency department  physician placed him on a nitroglycerin drip.  The patient is currently  chest-pain-free.  In addition, the emergency department physician also gave  the patient a GI cocktail.  The patient has no complaints of chest pain,  nausea, vomiting or diarrhea at this time.   REVIEW OF SYSTEMS:  The patient's review of systems has been negative for  abdominal  pain, fever, chills, hematemesis, melena, hematochezia, dysuria,  rash, swelling in the legs, joint pain or numbness or tingling in the feet  or hands.  His review of systems is negative for headache, weight loss,  upper respiratory infection symptoms, chest pain, shortness of breath,  diaphoresis or radiation of the chest pain.  The patient has had no pleurisy  with the chest pain.   PAST MEDICAL HISTORY:  1. Type 2 diabetes mellitus, insulin requiring, diagnosed in 1986.  2. Hyperlipidemia.   MEDICATIONS:  1. Humalog insulin 75/25 -- 25 to 30 units subcu q.a.m.  2. Lipitor 20 mg nightly.  3. Multivitamin milkshake b.i.d.   ALLERGIES:  No known drug allergies.   FAMILY HISTORY:  The patient's father died at 54 years of age of unknown  causes.  No known history of heart disease, stroke or heart failure.  His  mother died at 20 years of age; he cannot recall the reason why.  She had no  history of heart failure, heart disease or stroke.  He had two sisters; one  is currently still living, she is 74 years old and has a history of  hypertension and osteoarthritis; he had one sister who died in her 17s  secondary to lung cancer.   SOCIAL HISTORY:  The patient is married and lives in [place] with  his wife.   He has four children.  He is employed full-time as an Airline pilot.  He has a remote history of tobacco use about 30 years ago.  He stopped  drinking alcohol about 15 years ago.  He can read and write.   PHYSICAL EXAMINATION:  GENERAL:  The patient is a pleasant, alert, obese 76-  year-old [place] American man who is currently lying in bed in no acute  distress.  VITAL SIGNS:  Temperature 97, blood pressure 108/58, pulse 77, respiratory  rate 16, oxygen saturation on room air 95.  HEENT:  Head is normocephalic and nontraumatic.  Pupils are equal, round and  reactive to light.  Extraocular movements are intact.  Conjunctivae are  clear.  Sclerae are white.  No proptosis or ptosis noted.  Tympanic  membranes are clear bilaterally.  Nasal mucosa is moist, no drainage.  Oropharynx reveals moist mucous membranes, no posterior exudates or  erythema.  His teeth are in good repair.  NECK:  His neck is supple.  No adenopathy, no thyromegaly, no bruit.  LUNGS:  Lungs are clear to auscultation bilaterally.  HEART:  S1 and S2 with a soft systolic murmur.  CHEST WALL:  There was no tenderness of his pectoralis muscles, no  tenderness of the substernal area.  ABDOMEN:  Positive bowel sounds.  Obese, nontender, nondistended.  No  hepatosplenomegaly.  No masses palpated.  GU AND RECTAL:  Deferred.  EXTREMITIES:  The patient has good pedal pulses.  No pretibial edema.  No  pedal edema.  He has good range of motion of all of his joints.  NEUROLOGIC:  The patient is alert and oriented x3.  Cranial nerves II-XII  are intact.  Strength is intact, upper and lower extremities, symmetrically.  Sensation is intact of the lower extremities.   ADMISSION LABORATORIES:  EKG revealed normal sinus rhythm, nonspecific T  wave abnormality.  Portable chest x-ray reveals bibasilar atelectasis.   CK-MB 1.5, myoglobin 64.7, troponin I less than 0.05; repeated CK-MB 1.1, repeated myoglobin 201, repeated troponin I less  than 0.05.  WBC 8.9,  hemoglobin 14.2, hematocrit 42.5, platelets 208,000.   ASSESSMENT:  1. Substernal chest pressure.  The differential diagnoses include     angina/unstable angina, gastroesophageal reflux disease, intrathoracic     gas, and possible chest wall pain.  The patient's chest pain was somewhat     relieved with nitroglycerin, however, his symptoms seem to be more     consistent with gastroesophageal reflux disease and indigestion.  The     patient certainly has risk factors for heart disease including     hyperlipidemia and type 2 diabetes mellitus, as well as male gender and     obesity.  His EKG is more or less unremarkable.  He does not appear to     have any substantial cardiomegaly on the chest x-ray.  2. Nausea, vomiting and diarrhea.  The patient's symptoms started several     hours after eating clam chowder.  The patient has no abdominal pain and     no abdominal cramping.  His symptoms may be related to an infectious     gastroenteritis from the clam chowder.  3. Type 2 insulin-requiring diabetes mellitus.  The patient states that his     blood sugars generally range in the 140s to 150s.  4. Hyperlipidemia.  The patient is being treated with Lipitor 20 mg nightly     in the outpatient setting.   PLAN:  1. The patient will be admitted to a telemetry bed.  Cardiac enzymes will be     ordered every 8 hours x3 to rule out a myocardial infarction.  2. The nitroglycerin, which is going at 20 mcg, will be titrated off and the     patient will be treated with Nitrol paste 1 inch every 6 hours.  The     Nitrol paste will need to be titrated up or down according to the     patient's pain, however, he is currently chest-pain-free.  3. We will treat pain additionally with Tylenol and morphine as needed.  4. We will place the patient on prophylactic Lovenox at 30 mg subcu q.12 h.  5. We will treat the patient empirically with Protonix 40 mg IV daily x48     hours and then 40  mg p.o. daily.  We will also give the patient Mylanta     30 mL t.i.d. x1 day, then p.r.n. thereafter.  6. We will decrease the dose of the patient's Humalog to 75/25 -- 10 units     subcu b.i.d.  We will monitor his blood sugars q.a.c. and nightly x24     hours and then q.a.m. and nightly thereafter.  We will give the patient     clear liquids for breakfast and then advance the diet as needed.  The     insulin will need to be titrated up with better p.o. intake.  7. We will add a CMET to the labs as well as adding an amylase and a lipase.     We will check a fasting lipid panel as well.  8. We will check a PT and PTT as well as a homocysteine level.  We will     check a hemoglobin A1c, TSH and free T4.  9. We will treat the patient's nausea as needed with Phenergan.  10.      We will obtain stool cultures for C&S, O&P and C. difficile.  We     will also guaiac the patient's stools. 11.      We will continue aspirin at 81 mg daily.  We will  check a 2-D     echocardiogram to evaluate the patient's chest pain.  12.      We will consider a cardiology consult for further evaluation.                                                Elliot Cousin, M.D.    DF/MEDQ  D:  03/13/2003  T:  03/13/2003  Job:  [id number]   cc:   Franco Nones.D.

## 2010-08-29 NOTE — Assessment & Plan Note (Signed)
[facility]                            CARDIOLOGY OFFICE NOTE   CYNCERE, SONTAG                      MRN:          [id number]  DATE:06/16/2006                            DOB:          [birth year]    Mr. Mcclaren is in for followup. He is doing well. He check his blood  sugar every morning. He has not been exercising however. He has stayed  busy during tax season.   CURRENT MEDICATIONS:  1. Baby aspirin 81 mg daily.  2. Toprol XL 100 mg daily.  3. Plavix 75 mg daily.  4. Diovan 80 mg daily.  5. Lipitor 80 mg p.o. q nightly.  6. Zetia 10 mg q nightly.  7. Humalog insulin.   PHYSICAL EXAMINATION:  He is alert and oriented. Weight is 219 pounds.  Blood pressure is 142/90, pulse 63.  Jugular veins not distended. Carotid upstrokes brisk.  Lung fields are clear to auscultation and percussion.  PMI nondisplaced. Normal 1st and 2nd heart sounds. No extremity edema.   EKG: Reveals normal sinus rhythm, nonspecific T-wave abnormality.   IMPRESSION:  1. Coronary artery disease.  2. Insulin-dependent diabetes mellitus.  3. Hypercholesterolemia.   PLAN:  1. Continue current medical regimen.  2. Lipid and liver profile in six months.  3. Encouraged the patient to walk regularly.     Arturo Morton. Riley Kill, MD, [facility]  Electronically Signed    TDS/MedQ  DD: 06/16/2006  DT: 06/16/2006  Job #: [id number]

## 2010-08-29 NOTE — Cardiovascular Report (Signed)
NAME:  MANCIL, PFENNING NO.:  [id number]   MEDICAL RECORD NO.:  [id number]                   PATIENT TYPE:  INP   LOCATION:  4731                                 FACILITY:  [facility]   PHYSICIAN:  Arturo Morton. Riley Kill, M.D.             DATE OF BIRTH:  18-May-1934   DATE OF PROCEDURE:  03/14/2003  DATE OF DISCHARGE:                              CARDIAC CATHETERIZATION   INDICATIONS:  [name] is a very delightful 76 year old gentleman who  presented with chest pain.  Enzymes were initially negative but subsequently  turned positive.  He had no electrocardiographic abnormalities.  He was  subsequently brought to the cath lab after treatment with enoxaparin and  glycoprotein inhibitors.  The catheterization was done for non-ST-elevation  myocardial infarction in an insulin-dependent diabetic.   PROCEDURE:  1. Left heart catheterization.  2. Selective coronary arteriography.  3. Selective left ventriculography.   DESCRIPTION OF THE PROCEDURE:  The procedure was performed from the right  femoral artery using 6-French catheters.  He tolerated the procedure well,  although he had a fair amount of back discomfort on the table; fentanyl and  Versed were administered to try to improve on his symptoms.  He was  subsequently taken to the holding area in satisfactory clinical condition.  There were no complications noted during the course of the procedure.   HEMODYNAMIC DATA:  1. Central aortic pressure 112/74, mean 90.  2. Left ventricle 117/25.  3. No gradient on pullback across the aortic valve.   ANGIOGRAPHIC DATA:  1. Ventriculography was performed in the RAO projection.  Overall systolic     function was mildly reduced.  Ejection fraction was calculated at 47% but     appeared to be visually better.  There was hypokinesis of the inferobasal     segment.  No significant mitral regurgitation was noted.  2. The left main coronary artery was free of critical  disease.  3. The left anterior descending artery coursed to the apex.  The LAD and its     branch had a fairly typical diabetic appearance with multiple areas of     luminal irregularity.  Beyond the origin of the major diagonal branch was     a 40-50% area of focal stenosis that was slightly hypodense that appeared     to be less severe in some views than others.  There was also a 30-40%     ostial narrowing of the major diagonal branch.  There was moderate     plaquing noted at the apical portion of the LAD as well; critical     stenoses, however, were not noted.  4. The circumflex is a dominant vessel.  There is a first marginal branch     that has a typical diabetic appearance and a focal area of about 60%     narrowing.  There is also a second marginal branch that  bifurcates     distally and again has diffuse luminal irregularities compatible with     diabetes.  The A-V circumflex courses to the posterior wall, where it     supplies two posterolateral branches and then is totally occluded,     functioning as a posterior descending branch.  In the initial RAO films,     there was evidence of some collateralization of the distal PDA, and this     had an appearance of being somewhat diffusely irregular.  5. The right coronary artery was a nondominant vessel that was free of     critical disease.   CONCLUSIONS:  1. Mild reduction in left ventricular function with an inferobasal wall     motion abnormality but with hypokinesis and not akinesis.  2. Currently normal electrocardiogram.  3. Total occlusion of the distal circumflex prior to the posterior     descending branch with early collateralization of this vessel.  4. Moderate disease of the left anterior descending and diagonal system.  5. Typical diabetic appearance of the coronary arteries.   RECOMMENDATIONS:  Overall, the patient has no current symptoms.  His  electrocardiogram is normal.  The circumflex occlusion appears to be  distal,  and based upon the diabetic appearance of the remaining vessels, it seems it  would be optimal to recommend medical therapy at this point.  Percutaneous  intervention would be recommended for recurrent continued symptoms.  The  patient stopped his antiplatelet therapy earlier in the summer and is an  insulin-dependent diabetic.  Resumption of antiplatelet therapy including  aspirin and clopidogrel would be recommended.  In addition, initiation of  cardiac rehabilitation, weight loss, improved insulin sensitivity, and  aggressive statin therapy would be optimal for this patient.  He will need  continued followup in the cardiology clinic.                                               Arturo Morton. Riley Kill, M.D.    TDS/MEDQ  D:  03/14/2003  T:  03/14/2003  Job:  [id number]   cc:   [facility]   Dr. Almyra Deforest, M.D.

## 2010-08-29 NOTE — Consult Note (Signed)
NAME:  William Aguirre, William Aguirre NO.:  [id number]   MEDICAL RECORD NO.:  [id number]                   PATIENT TYPE:  INP   LOCATION:  4731                                 FACILITY:  [facility]   PHYSICIAN:   Bing, M.D.               DATE OF BIRTH:  September 18, 1934   DATE OF CONSULTATION:  03/13/2003  DATE OF DISCHARGE:                                   CONSULTATION   CARDIOLOGY CONSULTATION   REFERRING PHYSICIAN:  Elliot Cousin, M.D.   PRIMARY CARE PHYSICIAN:  Renaye Rakers, M.D.   HISTORY OF PRESENT ILLNESS:  The patient is a 76 year old gentleman admitted  to [facility] 12 hours ago with chest discomfort and now  found to have positive cardiac markers.   The patient has no prior history of coronary disease.  He does have multiple  cardiovascular risk factors including diabetes and hyperlipidemia, both of  them well managed medically.  Over the past few days the patient has noted a  number of episodes of relatively minor chest burning discomfort without  radiation, without dyspnea and without diaphoresis.  There was associated  nausea.  The first two episodes lasted for a number of minutes to an hour or  two and resolved spontaneously.  The third episode prompted EMS to be  summoned.  Sublingual nitroglycerin was given with improvement.  There was  some recurrent discomfort upon arrival at the hospital and subsequently.  The patient has felt well over the past few hours.   The patient developed diabetes approximately 18 years ago.  He has somewhat  variable control at home.  His treatment regimen includes insulin.  Hyperlipidemia is treated with atorvastatin 20 mg daily.  He was taking  daily aspirin in the past but has not done so for approximately 4 months.  He is taking no other medications.  He is unaware of any medical allergies.   PAST MEDICAL HISTORY:  Otherwise unremarkable.  The patient has never  undergone surgery.  He has not  had any significant previous cardiac testing.   SOCIAL HISTORY:  Lives in [place] with his wife. He works as an  Airline pilot.  Occasionally plays golf.  Discontinued cigarette smoking 30  years ago after a total consumption of less than 10 pack years.  Denies  excessive use of alcohol.  He walks regularly.   FAMILY HISTORY:  Illnesses of his parents are unknown.  He has a sister with  hypertension and a sister who died from lung cancer.   REVIEW OF SYMPTOMS:  Notable for the need for corrective lenses; good  dentition.  Occasional nausea, emesis and diarrhea.  All other systems  negative.   PHYSICAL EXAMINATION:  GENERAL APPEARANCE:  Pleasant, well-appearing  gentleman.  VITAL SIGNS:  Temperature 98.  Heart rate 90 and regular.  Respirations 20.  Blood pressure 105/65.  Weight 220.  Oxygen saturation 96% on 2  liters.  SKIN:  Hyperpigmentation over the left face.  HEENT:  Anicteric sclerae.  NECK:  No jugular venous distention; no carotid bruits.  ENDOCRINE:  No thyromegaly.  HEMATOPOIETIC:  No adenopathy.  NEUROMUSCULAR:  Symmetric strength and tone.  MUSCULOSKELETAL:  Supple neck.  Full range of motion in all joints.  LUNGS:  Clear.  CARDIAC:  Normal 1st and 2nd heart sounds; fourth heart sound present.  ABDOMEN:  Soft, nontender; no bruits; no organomegaly.  EXTREMITIES:  Normal distal pulses; no edema.   LABORATORY DATA:  Electrocardiogram normal sinus rhythm; nonspecific ST-T  wave abnormalities.   Initial total CPK 327 with MB fraction of 31.  Troponin 1.1.  TSH 0.09.  Hemoglobin A1C 8.1%.   IMPRESSION AND PLAN:  Mr. Mathew presents with acute coronary syndrome as  documented by minor electrocardiogram abnormalities and abnormal cardiac  markers.  Although the quality of his chest discomfort was somewhat  atypical, it is certainly compatible with myocardial ischemia.  Since he has  had continuing symptoms his medical regime will be intensified with the  addition  of full dose low molecular weight heparin, metoprolol, intravenous  nitroglycerin and clopidogrel.  Lipid profile will be assessed to determine  if he needs additional lipid-lowering therapy.  More precise control of  diabetes will be desirable.  The risks and benefits of coronary angiography  and possible percutaneous coronary intervention were discussed with the  patient who agrees to proceed.  Coronary angiography will be performed in  the A.M., sooner if chest discomfort recurs.  The patient has been referred  to our research nurses for possible inclusion in one of our protocols.                                               [place] Bing, M.D.    RR/MEDQ  D:  03/13/2003  T:  03/13/2003  Job:  [id number]

## 2010-08-29 NOTE — Op Note (Signed)
NAME:  JAMAS, JAQUAY NO.:  [id number]   MEDICAL RECORD NO.:  [id number]          PATIENT TYPE:  AMB   LOCATION:  SDS                          FACILITY:  [facility]   PHYSICIAN:  Salley Scarlet., M.D.DATE OF BIRTH:  25-Sep-1934   DATE OF PROCEDURE:  08/27/2006  DATE OF DISCHARGE:  08/27/2006                               OPERATIVE REPORT   PREOPERATIVE DIAGNOSIS:  Immature cataract, right eye.   POSTOPERATIVE DIAGNOSIS:  Immature cataract, right eye.   OPERATION:  Kelman phacoemulsification of cataract, right eye.   ANESTHESIA:  Local using Xylocaine 2%, Marcaine 0.75%, and Wydase.   JUSTIFICATION FOR PROCEDURE:  This is a 76 year old male diabetic who  complained of blurred vision with difficulty seeing from the right eye.  He was evaluated and found to have a visual acuity of light position on  the right and 20/25 on the left.  There was a dense posterior  subcapsular cataract of the right eye with a lesser cataract of the left  eye.  Cataract extraction of the right eye was recommended with  intraocular lens implantation.  He is admitted at this time for that  purpose.   PROCEDURE:  Under the influence of IV sedation, a Van Lint akinesia and  retrobulbar anesthesia was given.  The patient was prepped and draped in  the usual manner.  The lid speculum was inserted under the upper and  lower lid of the right eye and a 4-0 silk traction suture was passed  through the belly of the superior rectus muscle for retraction.  A  fornix-based conjunctival flap was turned and hemostasis was achieved  using cautery.  An incision was made in the sclera at the limbus.  This  incision was dissected down to clear cornea using a crescent blade.  A  sideport incision was made at the 1:30 clock hour position.  Ocucoat was  injected into the eye through the sideport incision.  The anterior  chamber was then entered through the corneoscleral tunnel incision at  11:30 clock  hour position.  An anterior capsulotomy was done using a  bent 25-gauge needle.  The nucleus was hydrodissected using Xylocaine.  The KPE handpiece was passed into the eye and the nucleus was emulsified  without difficulty.  The residual cortical material was aspirated.  The  posterior capsule was polished using an olive-tip polisher.  The wound  was widened slightly to accommodate a foldable silicone lens.  The lens  was seated into the eye behind the iris without difficulty.  The  anterior chamber was reformed and the pupil was constricted using  Miochol.  The residual Ocucoat was aspirated from the eye.  The lips of  the wound were hydrated and tested to make sure that there was no leak.  After ascertaining that there was no leak, the conjunctiva was closed  over the wound using thermal cautery.  Celestone 1 mL and 0.5 mL cc of  gentamicin were injected subconjunctivally.  Maxitrol ophthalmic  ointment and Pilopine ointment were applied along with a patch and Fox  shield.  The patient tolerated the  procedure well and was  discharged to the post anesthesia recovery room in satisfactory  condition.  He is instructed to rest today, to take Vicodin every 4  hours as needed, and to see me in the office tomorrow for further  evaluation.   DISCHARGE DIAGNOSIS:  Immature cataract, right eye thyroid.      Salley Scarlet., M.D.  Electronically Signed     TB/MEDQ  D:  08/28/2006  T:  08/28/2006  Job:  [id number]

## 2010-09-07 ENCOUNTER — Other Ambulatory Visit: Payer: Self-pay | Admitting: Cardiology

## 2010-09-15 ENCOUNTER — Encounter: Payer: Self-pay | Admitting: Cardiology

## 2010-09-27 ENCOUNTER — Other Ambulatory Visit: Payer: Self-pay | Admitting: Cardiology

## 2010-10-01 ENCOUNTER — Telehealth: Payer: Self-pay | Admitting: Cardiology

## 2010-10-01 MED ORDER — ATORVASTATIN CALCIUM 80 MG PO TABS: 80.0000 mg | ORAL_TABLET | Freq: Every day | ORAL | Status: DC

## 2010-10-01 NOTE — Telephone Encounter (Signed)
Pt calling re ok if he gets generic lipitor [phone] or [phone] ok to leave message

## 2010-10-01 NOTE — Telephone Encounter (Signed)
Will review with Dr. Riley Kill.

## 2010-10-01 NOTE — Telephone Encounter (Signed)
Informed patient that it should be ok to switch to the generic of Lipitor.

## 2011-03-03 ENCOUNTER — Telehealth: Payer: Self-pay | Admitting: Cardiology

## 2011-03-03 MED ORDER — METOPROLOL TARTRATE 50 MG PO TABS: 50.0000 mg | ORAL_TABLET | Freq: Two times a day (BID) | ORAL | Status: DC

## 2011-03-03 NOTE — Telephone Encounter (Signed)
New message Refill of metoprolol.   Please give them DEA #

## 2011-03-31 ENCOUNTER — Ambulatory Visit (INDEPENDENT_AMBULATORY_CARE_PROVIDER_SITE_OTHER): Payer: No Typology Code available for payment source | Admitting: Cardiology

## 2011-03-31 ENCOUNTER — Encounter: Payer: Self-pay | Admitting: Cardiology

## 2011-03-31 DIAGNOSIS — I2510 Atherosclerotic heart disease of native coronary artery without angina pectoris: Secondary | ICD-10-CM

## 2011-03-31 DIAGNOSIS — E785 Hyperlipidemia, unspecified: Secondary | ICD-10-CM

## 2011-03-31 DIAGNOSIS — I10 Essential (primary) hypertension: Secondary | ICD-10-CM

## 2011-03-31 MED ORDER — CLOPIDOGREL BISULFATE 75 MG PO TABS: 75.0000 mg | ORAL_TABLET | Freq: Every day | ORAL | Status: AC

## 2011-03-31 MED ORDER — EZETIMIBE 10 MG PO TABS: 10.0000 mg | ORAL_TABLET | Freq: Every day | ORAL | Status: DC

## 2011-03-31 MED ORDER — ATORVASTATIN CALCIUM 80 MG PO TABS: 80.0000 mg | ORAL_TABLET | Freq: Every day | ORAL | Status: DC

## 2011-03-31 MED ORDER — VALSARTAN 80 MG PO TABS: 80.0000 mg | ORAL_TABLET | Freq: Every day | ORAL | Status: DC

## 2011-03-31 MED ORDER — METOPROLOL TARTRATE 50 MG PO TABS: 50.0000 mg | ORAL_TABLET | Freq: Two times a day (BID) | ORAL | Status: DC

## 2011-03-31 NOTE — Patient Instructions (Signed)
Your physician wants you to follow-up in: 6 MONTHS.  You will receive a reminder letter in the mail two months in advance. If you don't receive a letter, please call our office to schedule the follow-up appointment.  Your physician recommends that you continue on your current medications as directed. Please refer to the Current Medication list given to you today.  

## 2011-03-31 NOTE — Progress Notes (Signed)
   HPI:  Mr. Hoopes is in for follow up.  He is stable at the present time.  He has not been having any chest pain or shortness of breath.  His insulin therapy has been intensified.  He has had multiple tubes of labs drawn recently, and told things are ok.  We will try to get those from Dr. Clide Deutscher.   Current Outpatient Prescriptions  Medication Sig Dispense Refill  . atorvastatin (LIPITOR) 80 MG tablet Take 1 tablet (80 mg total) by mouth daily.  30 tablet  6  . clopidogrel (PLAVIX) 75 MG tablet Take 1 tablet (75 mg total) by mouth daily.  30 tablet  11  . DIOVAN 80 MG tablet TAKE ONE TABLET BY MOUTH EVERY DAY  30 each  6  . FREESTYLE LITE test strip       . metoprolol (LOPRESSOR) 50 MG tablet Take 1 tablet (50 mg total) by mouth 2 (two) times daily.  60 tablet  5  . NOVOLIN 70/30 (70-30) 100 UNIT/ML injection Inject 20 Units into the skin 2 (two) times daily with a meal. 10 units in the morning and depends what the last glucose reading is between 10 and 20 units at nite      . NOVOLOG 100 UNIT/ML injection Inject 14 Units into the skin 3 (three) times daily before meals.       Marland Kitchen ZETIA 10 MG tablet TAKE ONE TABLET BY MOUTH EVERY DAY  30 each  6    No Known Allergies  No past medical history on file.  No past surgical history on file.  No family history on file.  History   Social History  . Marital Status: Married    Spouse Name: N/A    Number of Children: N/A  . Years of Education: N/A   Occupational History  . Not on file.   Social History Main Topics  . Smoking status: Never Smoker   . Smokeless tobacco: Not on file  . Alcohol Use: Not on file  . Drug Use: Not on file  . Sexually Active: Not on file   Other Topics Concern  . Not on file   Social History Narrative  . No narrative on file    ROS: Please see the HPI.  All other systems reviewed and negative.  PHYSICAL EXAM:  BP 142/80  Pulse 64  Ht 5' 8.5" (1.74 m)  Wt 100.154 kg (220 lb 12.8 oz)  BMI 33.08  kg/m2  General: Well developed, well nourished, in no acute distress. Head:  Normocephalic and atraumatic. Neck: no JVD Lungs: Clear to auscultation and percussion. Heart: Normal S1 and S2.  No murmur, rubs or gallops.  Abdomen:  Normal bowel sounds; soft; non tender; no organomegaly Pulses: Pulses normal in all 4 extremities. Extremities: No clubbing or cyanosis. No edema. Neurologic: Alert and oriented x 3.  EKG:  NSR.  WNL.  ASSESSMENT AND PLAN:

## 2011-03-31 NOTE — Assessment & Plan Note (Signed)
Systolic minimally elevated, but at the present office visit only.  Home ambulatory recordings not known.

## 2011-03-31 NOTE — Assessment & Plan Note (Signed)
He continues to remain stable.  I explained to him the results of FREEDOM.  He is not having any current symptoms, but I have carefully explained to him that should he have a change in functional status he should notify us promptly.

## 2011-03-31 NOTE — Assessment & Plan Note (Signed)
Get labs from Dr. Fleeta Emmer office and assess.

## 2011-08-03 ENCOUNTER — Telehealth: Payer: Self-pay | Admitting: Cardiology

## 2011-08-03 NOTE — Telephone Encounter (Signed)
I spoke with the pt and he threw the letter that he received from the insurance company into his trash can at work today.  The pt does remember seeing a list of covered medications on letter.  The pt will see if letter is still in his trash can tomorrow.  If it is not then he will contact his insurance company.  The pt will call me with the list of covered medications.

## 2011-08-03 NOTE — Telephone Encounter (Signed)
Please return call to patient regarding mediation, he can be reached at wk# [phone]   Patient received letter from insurance regarding request to switch Diovan and zetia RX to generics.

## 2011-08-04 NOTE — Telephone Encounter (Signed)
F/U-please return call to patient at [phone]  Patient called to report the generic for Diovan is valsartan, please return call to patient at [phone] ext [phone]

## 2011-08-28 NOTE — Telephone Encounter (Signed)
Per Dr Riley Kill if Diovan is not approved then the pt should start Losartan 25mg  once a day and have a BMP checked in 10 days.

## 2011-09-02 NOTE — Telephone Encounter (Signed)
I spoke with [facility] and they show that Diovan and Zetia are still approved by the insurance. The pharmacist ran both of these medications through the system while I was on the phone and they were approved. I spoke with the pt and made him aware that he should continue both of these medications at this time.  If the pt has problems in the future with Diovan then he can switch to Losartan per Dr Rosalyn Charters previous recommendation.

## 2011-09-30 ENCOUNTER — Ambulatory Visit (INDEPENDENT_AMBULATORY_CARE_PROVIDER_SITE_OTHER): Payer: Medicare HMO | Admitting: Cardiology

## 2011-09-30 ENCOUNTER — Encounter: Payer: Self-pay | Admitting: Cardiology

## 2011-09-30 VITALS — BP 138/80 | HR 73 | Ht 68.0 in | Wt 216.0 lb

## 2011-09-30 DIAGNOSIS — I10 Essential (primary) hypertension: Secondary | ICD-10-CM

## 2011-09-30 DIAGNOSIS — E119 Type 2 diabetes mellitus without complications: Secondary | ICD-10-CM

## 2011-09-30 DIAGNOSIS — I2510 Atherosclerotic heart disease of native coronary artery without angina pectoris: Secondary | ICD-10-CM

## 2011-09-30 DIAGNOSIS — E785 Hyperlipidemia, unspecified: Secondary | ICD-10-CM

## 2011-09-30 NOTE — Patient Instructions (Addendum)
Your physician wants you to follow-up in: 6 MONTHS.  You will receive a reminder letter in the mail two months in advance. If you don't receive a letter, please call our office to schedule the follow-up appointment.  Your physician recommends that you continue on your current medications as directed. Please refer to the Current Medication list given to you today.  

## 2011-10-07 NOTE — Progress Notes (Signed)
HPI:  The patient continues get along well. He denies any major chest pain. He does remain on aggressive lipid-lowering therapy.  He was an insulin-dependent diabetic, and has diffuse coronary artery disease. We have reviewed the results of the Freedom trial for the past and also the current treatment strategy.  His last cath was in 2004 and revealed the following findings:  CONCLUSIONS:  1. Mild reduction in left ventricular function with an inferobasal wall  motion abnormality but with hypokinesis and not akinesis.  2. Currently normal electrocardiogram.  3. Total occlusion of the distal circumflex prior to the posterior  descending branch with early collateralization of this vessel.  4. Moderate disease of the left anterior descending and diagonal system.  5. Typical diabetic appearance of the coronary arteries.  RECOMMENDATIONS: Overall, the patient has no current symptoms. His  electrocardiogram is normal. The circumflex occlusion appears to be distal,  and based upon the diabetic appearance of the remaining vessels, it seems it  would be optimal to recommend medical therapy at this point. Percutaneous  intervention would be recommended for recurrent continued symptoms. The  patient stopped his antiplatelet therapy earlier in the summer and is an  insulin-dependent diabetic. Resumption of antiplatelet therapy including  aspirin and clopidogrel would be recommended. In addition, initiation of  cardiac rehabilitation, weight loss, improved insulin sensitivity, and  aggressive statin therapy would be optimal for this patient. He will need  continued followup in the cardiology clinic.    Current Outpatient Prescriptions  Medication Sig Dispense Refill  . atorvastatin (LIPITOR) 80 MG tablet Take 1 tablet (80 mg total) by mouth daily.  30 tablet  11  . clopidogrel (PLAVIX) 75 MG tablet Take 1 tablet (75 mg total) by mouth daily.  30 tablet  11  . ezetimibe (ZETIA) 10 MG tablet Take 1  tablet (10 mg total) by mouth daily.  30 tablet  11  . FREESTYLE LITE test strip       . metoprolol (LOPRESSOR) 50 MG tablet Take 1 tablet (50 mg total) by mouth 2 (two) times daily.  60 tablet  11  . NOVOLIN 70/30 (70-30) 100 UNIT/ML injection Inject 20 Units into the skin 2 (two) times daily with a meal. 10 units in the morning and depends what the last glucose reading is between 10 and 20 units at nite      . NOVOLOG 100 UNIT/ML injection Inject 14 Units into the skin 3 (three) times daily before meals.       . valsartan (DIOVAN) 80 MG tablet Take 1 tablet (80 mg total) by mouth daily.  30 tablet  11  . ANDROGEL PUMP 20.25 MG/ACT (1.62%) GEL as directed.      . Lancets (FREESTYLE) lancets       . RELION SHORT PEN NEEDLES 31G X 8 MM MISC         No Known Allergies  No past medical history on file.  No past surgical history on file.  No family history on file.  History   Social History  . Marital Status: Married    Spouse Name: N/A    Number of Children: N/A  . Years of Education: N/A   Occupational History  . Not on file.   Social History Main Topics  . Smoking status: Never Smoker   . Smokeless tobacco: Not on file  . Alcohol Use: Not on file  . Drug Use: Not on file  . Sexually Active: Not on file   Other  Topics Concern  . Not on file   Social History Narrative  . No narrative on file    ROS: Please see the HPI.  All other systems reviewed and negative.  PHYSICAL EXAM:  BP 138/80  Pulse 73  Ht 5\' 8"  (1.727 m)  Wt 216 lb (97.977 kg)  BMI 32.84 kg/m2  General: Well developed, well nourished, in no acute distress. Head:  Normocephalic and atraumatic. Neck: no JVD Lungs: Clear to auscultation and percussion. Heart: Normal S1 and S2.  No murmur, rubs or gallops.  Abdomen:  Normal bowel sounds; soft; non tender; no organomegaly Pulses: Pulses normal in all 4 extremities. Extremities: No clubbing or cyanosis. No edema. Neurologic: Alert and oriented x  3.  EKG:  NSR.  Delay in R wave progression.  (cannot exclude old anterior, but likely due to lead position.    ASSESSMENT AND PLAN:

## 2011-10-25 NOTE — Assessment & Plan Note (Signed)
Had typical diabetic appearing CAD at the last check.  I would not make any major changes at the present time.  He is not having any ischemic symptoms, and aggressive prevention therapy is warranted.  He understands the nature of diabetic CAD, and wether he truly understands the variables, I have explained the various studies done in detail  (BARI 2D, Freedom).  Medical management is warranted.

## 2011-10-25 NOTE — Assessment & Plan Note (Signed)
At diabetic target.

## 2011-10-25 NOTE — Assessment & Plan Note (Signed)
Followed by his primary MD, but was near target.  Will continue to recommend an aggressive regimen.

## 2011-10-25 NOTE — Assessment & Plan Note (Signed)
Insulin requiring under the care of his primary MD.

## 2011-11-05 ENCOUNTER — Encounter: Payer: Self-pay | Admitting: Cardiology

## 2012-03-21 ENCOUNTER — Ambulatory Visit: Payer: Medicare HMO | Admitting: Cardiology

## 2012-04-20 ENCOUNTER — Encounter: Payer: Self-pay | Admitting: Cardiology

## 2012-04-20 ENCOUNTER — Ambulatory Visit (INDEPENDENT_AMBULATORY_CARE_PROVIDER_SITE_OTHER): Payer: Medicare HMO | Admitting: Cardiology

## 2012-04-20 VITALS — BP 140/80 | HR 70 | Ht 68.0 in | Wt 216.0 lb

## 2012-04-20 DIAGNOSIS — I2510 Atherosclerotic heart disease of native coronary artery without angina pectoris: Secondary | ICD-10-CM

## 2012-04-20 DIAGNOSIS — E785 Hyperlipidemia, unspecified: Secondary | ICD-10-CM

## 2012-04-20 DIAGNOSIS — I10 Essential (primary) hypertension: Secondary | ICD-10-CM

## 2012-04-20 NOTE — Patient Instructions (Signed)
Follow up with Dr.McAlhany in 6 months.

## 2012-04-23 NOTE — Progress Notes (Signed)
HPI:  Patient returns in a followup visit today. We had a thorough discussion of the situation, and I discussed his case with his wife and the patient in some detail. Patient has diabetes mellitus. He seems to be exercising less. We had a discussion about this as well. I encouraged him to do a little bit more. He rarely, or perhaps just occasionally develops some discomfort with activity. His last cardiac catheterization was done in 2004. The patient had segmental disease of the left anterior descending and diagonal systems, and occlusion of the distal circumflex prior to the PDA with early collateralization of this vessel. Head typical diabetic appearance of the coronaries. He's continued to do well from a cardiac standpoint since that time, and has had no progression in symptoms. Overall, he has remained stable.  Current Outpatient Prescriptions  Medication Sig Dispense Refill  . ANDROGEL PUMP 20.25 MG/ACT (1.62%) GEL as directed.      Marland Kitchen aspirin 81 MG tablet Take 81 mg by mouth daily.      Marland Kitchen atorvastatin (LIPITOR) 80 MG tablet Take 80 mg by mouth daily.      . clopidogrel (PLAVIX) 75 MG tablet Take 75 mg by mouth daily.      Marland Kitchen ezetimibe (ZETIA) 10 MG tablet Take 1 tablet (10 mg total) by mouth daily.  30 tablet  11  . FREESTYLE LITE test strip       . Insulin Aspart Prot & Aspart (NOVOLOG MIX 70/30 Magnet) Inject into the skin. 4 units the am - 4 units in the afternoon - 6 units in the evening      . Insulin Isophane & Regular (HUMULIN 70/30 ) Inject into the skin. 10 units in the am  And 10 units at night      . Lancets (FREESTYLE) lancets       . metoprolol (LOPRESSOR) 50 MG tablet Take 1 tablet (50 mg total) by mouth 2 (two) times daily.  60 tablet  11  . RELION SHORT PEN NEEDLES 31G X 8 MM MISC       . valsartan (DIOVAN) 80 MG tablet Take 1 tablet (80 mg total) by mouth daily.  30 tablet  11    No Known Allergies  No past medical history on file.  No past surgical history on file.  No  family history on file.  History   Social History  . Marital Status: Married    Spouse Name: N/A    Number of Children: N/A  . Years of Education: N/A   Occupational History  . Not on file.   Social History Main Topics  . Smoking status: Never Smoker   . Smokeless tobacco: Not on file  . Alcohol Use: Not on file  . Drug Use: Not on file  . Sexually Active: Not on file   Other Topics Concern  . Not on file   Social History Narrative  . No narrative on file    ROS: Please see the HPI.  All other systems reviewed and negative.  PHYSICAL EXAM:  BP 140/80  Pulse 70  Ht 5\' 8"  (1.727 m)  Wt 216 lb (97.977 kg)  BMI 32.84 kg/m2  General: Well developed, well nourished, in no acute distress. Head:  Normocephalic and atraumatic. Neck: no JVD Lungs: Clear to auscultation and percussion. Heart: Normal S1 and S2.  No murmur, rubs or gallops.  Abdomen:  Normal bowel sounds; soft; non tender; no organomegaly Pulses: Pulses normal in all 4 extremities. Extremities: No  clubbing or cyanosis. No edema. Neurologic: Alert and oriented x 3.  EKG:  NSR.  Delay in R wave probably due to lead placement  (Q2).  Non specific T flattening.    ASSESSMENT AND PLAN:

## 2012-04-25 NOTE — Assessment & Plan Note (Signed)
Controlled, but uncertain if he chronically meets the diabetic guidelines.

## 2012-04-25 NOTE — Assessment & Plan Note (Signed)
This has been followed by his primary care MD.

## 2012-04-25 NOTE — Assessment & Plan Note (Signed)
He is doing less and less seemingly.  Not particularly symptomatic at this point in time.  I would favor consideration of repeat exercise test given his underlying diabetic CAD.  Stressed to patient the importance of exercise.

## 2012-04-26 ENCOUNTER — Other Ambulatory Visit: Payer: Self-pay

## 2012-04-26 DIAGNOSIS — I10 Essential (primary) hypertension: Secondary | ICD-10-CM

## 2012-04-26 DIAGNOSIS — I2510 Atherosclerotic heart disease of native coronary artery without angina pectoris: Secondary | ICD-10-CM

## 2012-05-04 ENCOUNTER — Other Ambulatory Visit: Payer: Self-pay | Admitting: *Deleted

## 2012-05-04 MED ORDER — CLOPIDOGREL BISULFATE 75 MG PO TABS: 75.0000 mg | ORAL_TABLET | Freq: Every day | ORAL | Status: DC

## 2012-05-04 MED ORDER — ATORVASTATIN CALCIUM 80 MG PO TABS: 80.0000 mg | ORAL_TABLET | Freq: Every day | ORAL | Status: DC

## 2012-05-04 MED ORDER — EZETIMIBE 10 MG PO TABS: 10.0000 mg | ORAL_TABLET | Freq: Every day | ORAL | Status: DC

## 2012-05-04 MED ORDER — VALSARTAN 80 MG PO TABS: 80.0000 mg | ORAL_TABLET | Freq: Every day | ORAL | Status: DC

## 2012-05-04 MED ORDER — METOPROLOL TARTRATE 50 MG PO TABS: 50.0000 mg | ORAL_TABLET | Freq: Two times a day (BID) | ORAL | Status: DC

## 2012-05-24 ENCOUNTER — Encounter: Payer: Self-pay | Admitting: Cardiovascular Disease

## 2012-05-31 ENCOUNTER — Ambulatory Visit (INDEPENDENT_AMBULATORY_CARE_PROVIDER_SITE_OTHER): Payer: Medicare HMO | Admitting: Cardiology

## 2012-05-31 DIAGNOSIS — I2510 Atherosclerotic heart disease of native coronary artery without angina pectoris: Secondary | ICD-10-CM

## 2012-05-31 DIAGNOSIS — I10 Essential (primary) hypertension: Secondary | ICD-10-CM

## 2012-05-31 NOTE — Progress Notes (Signed)
Exercise Treadmill Test  Pre-Exercise Testing Evaluation Rhythm: normal sinus  Rate: 68                 Test  Exercise Tolerance Test Ordering MD: Shawnie Pons, MD  Interpreting MD: Shawnie Pons, MD  Unique Test No: 1  Treadmill:  1  Indication for ETT: CAD  Contraindication to ETT: No   Stress Modality: exercise - treadmill  Cardiac Imaging Performed: non   Protocol: standard Bruce - maximal  Max BP:  176/81  Max MPHR (bpm):  145 85% MPR (bpm):  123  MPHR obtained (bpm):  110 % MPHR obtained:  75%  Reached 85% MPHR (min:sec):  NA Total Exercise Time (min-sec):  3:19  Workload in METS:  4.6 Borg Scale: 18  Reason ETT Terminated:  fatigue    ST Segment Analysis At Rest: normal ST segments - no evidence of significant ST depression With Exercise: non-specific ST changes  Other Information Arrhythmia:  No Angina during ETT:  absent (0) Quality of ETT:  non-diagnostic  ETT Interpretation:  borderline (indeterminate) with non-specific ST changes  Comments: The patient did not exercise very far.  He simply felt tired.  No chest pain.  There were no diagnostic changes so the study was indeterminate.    Recommendations: He was not able to go very far today.  I suggested doing a nuclear study, but he would rather get more active with the warm weather and see how he does first. We discussed diabetic CAD in detail today, and I strongly encouraged him to get more active, and if he is having problems let us know.  We will have him see Dr. Clifton James in three months for a meet and greet.  Long discussion today.  He understands if he has any issues he should call us promptly.

## 2012-09-01 ENCOUNTER — Encounter: Payer: Self-pay | Admitting: Cardiovascular Disease

## 2012-09-01 ENCOUNTER — Ambulatory Visit (INDEPENDENT_AMBULATORY_CARE_PROVIDER_SITE_OTHER): Payer: Medicare HMO | Admitting: Cardiovascular Disease

## 2012-09-01 VITALS — BP 134/78 | HR 70 | Ht 68.0 in | Wt 211.0 lb

## 2012-09-01 DIAGNOSIS — E785 Hyperlipidemia, unspecified: Secondary | ICD-10-CM

## 2012-09-01 DIAGNOSIS — I2510 Atherosclerotic heart disease of native coronary artery without angina pectoris: Secondary | ICD-10-CM

## 2012-09-01 DIAGNOSIS — I10 Essential (primary) hypertension: Secondary | ICD-10-CM

## 2012-09-01 NOTE — Progress Notes (Signed)
History of Present Illness: 78 yo male with history of CAD, HTN, HLD, DM who is here today for cardiac follow up. He has been followed in the past by Dr. Riley Kill. His last cath was in 2004 and showed moderate LAD, Diagonal, Circumflex disease with diabetic appearance of vessels (see below). He has been managed medically since then. Exercise stress test 05/31/12 and he exercised for 3 minutes and stopped due to fatigue. He and Dr. Riley Kill discussed arranging a stress myoview but the patient declined.   He is here today for follow up. He denies any chest pain or SOB. He is feeling well. No lower extremity edema. He is going to start back golfing this summer.   Primary Care Physician: Birdie Sons  Last Lipid Profile: Followed in primary care  Past Medical History  Diagnosis Date  . Diabetes mellitus without complication   . Hyperlipidemia   . Coronary artery disease     Moderate LAD, Diagonal, Circumflex disease by cath 2004  . Hypertension     Past Surgical History  Procedure Laterality Date  . None      Current Outpatient Prescriptions  Medication Sig Dispense Refill  . aspirin 81 MG tablet Take 81 mg by mouth daily.      Marland Kitchen atorvastatin (LIPITOR) 80 MG tablet Take 1 tablet (80 mg total) by mouth daily.  30 tablet  6  . clopidogrel (PLAVIX) 75 MG tablet Take 1 tablet (75 mg total) by mouth daily.  30 tablet  6  . ezetimibe (ZETIA) 10 MG tablet Take 1 tablet (10 mg total) by mouth daily.  30 tablet  6  . FREESTYLE LITE test strip       . insulin aspart (NOVOLOG) 100 UNIT/ML injection NOVOLOG PEN  Inject three times a day /am 6 units/noon 6units /evening 8 units      . insulin glargine (LANTUS) 100 UNIT/ML injection lantus pen  One injection at night 10 units      . Lancets (FREESTYLE) lancets       . metoprolol (LOPRESSOR) 50 MG tablet Take 1 tablet (50 mg total) by mouth 2 (two) times daily.  60 tablet  6  . RELION SHORT PEN NEEDLES 31G X 8 MM MISC       . valsartan (DIOVAN) 80 MG  tablet Take 1 tablet (80 mg total) by mouth daily.  30 tablet  6   No current facility-administered medications for this visit.    No Known Allergies  History   Social History  . Marital Status: Married    Spouse Name: N/A    Number of Children: 4  . Years of Education: N/A   Occupational History  . Accountant    Social History Main Topics  . Smoking status: Never Smoker   . Smokeless tobacco: Not on file  . Alcohol Use: No  . Drug Use: No  . Sexually Active: Not on file   Other Topics Concern  . Not on file   Social History Narrative  . No narrative on file    Family History  Problem Relation Age of Onset  . CAD Neg Hx     Review of Systems:  As stated in the HPI and otherwise negative.   BP 134/78  Pulse 70  Ht 5\' 8"  (1.727 m)  Wt 211 lb (95.709 kg)  BMI 32.09 kg/m2  Physical Examination: General: Well developed, well nourished, NAD HEENT: OP clear, mucus membranes moist SKIN: warm, dry. No rashes. Neuro: No focal  deficits Musculoskeletal: Muscle strength 5/5 all ext Psychiatric: Mood and affect normal Neck: No JVD, no carotid bruits, no thyromegaly, no lymphadenopathy. Lungs:Clear bilaterally, no wheezes, rhonci, crackles Cardiovascular: Regular rate and rhythm. No murmurs, gallops or rubs. Abdomen:Soft. Bowel sounds present. Non-tender.  Extremities: No lower extremity edema. Pulses are 2 + in the bilateral DP/PT.  Cardiac cath December 2004: 1. Ventriculography was performed in the RAO projection. Overall systolic  function was mildly reduced. Ejection fraction was calculated at 47% but  appeared to be visually better. There was hypokinesis of the inferobasal  segment. No significant mitral regurgitation was noted.  2. The left main coronary artery was free of critical disease.  3. The left anterior descending artery coursed to the apex. The LAD and its  branch had a fairly typical diabetic appearance with multiple areas of  luminal irregularity.  Beyond the origin of the major diagonal branch was  a 40-50% area of focal stenosis that was slightly hypodense that appeared  to be less severe in some views than others. There was also a 30-40%  ostial narrowing of the major diagonal branch. There was moderate  plaquing noted at the apical portion of the LAD as well; critical  stenoses, however, were not noted.  4. The circumflex is a dominant vessel. There is a first marginal branch  that has a typical diabetic appearance and a focal area of about 60%  narrowing. There is also a second marginal branch that bifurcates  distally and again has diffuse luminal irregularities compatible with  diabetes. The A-V circumflex courses to the posterior wall, where it  supplies two posterolateral branches and then is totally occluded,  functioning as a posterior descending branch. In the initial RAO films,  there was evidence of some collateralization of the distal PDA, and this  had an appearance of being somewhat diffusely irregular.  5. The right coronary artery was a nondominant vessel that was free of  critical disease.  CONCLUSIONS:  1. Mild reduction in left ventricular function with an inferobasal wall  motion abnormality but with hypokinesis and not akinesis.  2. Currently normal electrocardiogram.  3. Total occlusion of the distal circumflex prior to the posterior  descending branch with early collateralization of this vessel.  4. Moderate disease of the left anterior descending and diagonal system.  5. Typical diabetic appearance of the coronary arteries.   Assessment and Plan:   1. CAD: Stable. No changes in therapy. Continue ASA/Plavix/statin/beta blocker.   2. Hyperlipidemia: Followed in primary care. Will get recent lab results from Dr. Blima Singer office.   3. HTN: BP well controlled. No changes today.

## 2012-09-01 NOTE — Patient Instructions (Addendum)
Your physician wants you to follow-up in:  6 months. You will receive a reminder letter in the mail two months in advance. If you don't receive a letter, please call our office to schedule the follow-up appointment.   

## 2012-09-07 ENCOUNTER — Telehealth: Payer: Self-pay | Admitting: Cardiovascular Disease

## 2012-09-07 NOTE — Telephone Encounter (Signed)
ROI faxed to Dr[facility]Jere [facility] Office at [phone]  09/07/12/KM

## 2012-09-07 NOTE — Telephone Encounter (Signed)
Records rec From [facility] Office gave to [facility] 09/07/12/KM

## 2012-09-08 ENCOUNTER — Encounter: Payer: Self-pay | Admitting: Family

## 2012-09-08 ENCOUNTER — Ambulatory Visit (INDEPENDENT_AMBULATORY_CARE_PROVIDER_SITE_OTHER): Payer: Medicare HMO | Admitting: Family

## 2012-09-08 VITALS — BP 140/80 | HR 71 | Wt 211.0 lb

## 2012-09-08 DIAGNOSIS — E785 Hyperlipidemia, unspecified: Secondary | ICD-10-CM

## 2012-09-08 DIAGNOSIS — I10 Essential (primary) hypertension: Secondary | ICD-10-CM

## 2012-09-08 DIAGNOSIS — E119 Type 2 diabetes mellitus without complications: Secondary | ICD-10-CM

## 2012-09-08 MED ORDER — GLUCOSE BLOOD VI STRP: ORAL_STRIP | Status: DC

## 2012-09-08 MED ORDER — INSULIN GLARGINE 100 UNIT/ML ~~LOC~~ SOLN: 10.0000 [IU] | Freq: Every day | SUBCUTANEOUS | Status: DC

## 2012-09-08 MED ORDER — INSULIN ASPART 100 UNIT/ML ~~LOC~~ SOLN: 8.0000 [IU] | Freq: Three times a day (TID) | SUBCUTANEOUS | Status: DC

## 2012-09-08 MED ORDER — INSULIN PEN NEEDLE 31G X 8 MM MISC: 1.0000 | Freq: Three times a day (TID) | Status: DC

## 2012-09-08 NOTE — Patient Instructions (Signed)

## 2012-09-08 NOTE — Progress Notes (Signed)
Subjective:    Patient ID: William Aguirre, male    DOB: October 19, 1934, 78 y.o.   MRN: [id number]  HPI 78 year old [place] American male, nonsmoker, new patient to the practice is in to be established. He has a history of type 2 diabetes, hypertension, hyperlipidemia, and coronary artery disease. He is currently stable on all of his medications. Denies any concerns. Reports fasting blood sugars between 134 and 162. He currently takes Lantus 10 units at bedtime and NovoLog 8 units in the evenings, 6 units in the morning and noon. Denies any hypoglycemia. Reports have been labs done approximately one month ago with Dr. blunt. However, does not recall his last A1c.  Reports never having a colonoscopy screening. Sees Dr. Mitzi Davenport , ophthalmology for diabetic eye exams annually.   Review of Systems  Constitutional: Negative.   HENT: Negative.   Respiratory: Negative.   Cardiovascular: Negative.   Gastrointestinal: Negative.   Endocrine: Negative.  Negative for polydipsia, polyphagia and polyuria.  Genitourinary: Negative.   Musculoskeletal: Negative.   Skin: Negative.   Neurological: Negative.   Psychiatric/Behavioral: Negative.    Past Medical History  Diagnosis Date  . Diabetes mellitus without complication   . Hyperlipidemia   . Coronary artery disease     Moderate LAD, Diagonal, Circumflex disease by cath 2004  . Hypertension     History   Social History  . Marital Status: Married    Spouse Name: N/A    Number of Children: 4  . Years of Education: N/A   Occupational History  . Accountant    Social History Main Topics  . Smoking status: Never Smoker   . Smokeless tobacco: Not on file  . Alcohol Use: No  . Drug Use: No  . Sexually Active: Not on file   Other Topics Concern  . Not on file   Social History Narrative  . No narrative on file    Past Surgical History  Procedure Laterality Date  . None      Family History  Problem Relation Age of Onset  . CAD Neg  Hx     No Known Allergies  Current Outpatient Prescriptions on File Prior to Visit  Medication Sig Dispense Refill  . aspirin 81 MG tablet Take 81 mg by mouth daily.      Marland Kitchen atorvastatin (LIPITOR) 80 MG tablet Take 1 tablet (80 mg total) by mouth daily.  30 tablet  6  . clopidogrel (PLAVIX) 75 MG tablet Take 1 tablet (75 mg total) by mouth daily.  30 tablet  6  . ezetimibe (ZETIA) 10 MG tablet Take 1 tablet (10 mg total) by mouth daily.  30 tablet  6  . Lancets (FREESTYLE) lancets       . metoprolol (LOPRESSOR) 50 MG tablet Take 1 tablet (50 mg total) by mouth 2 (two) times daily.  60 tablet  6  . valsartan (DIOVAN) 80 MG tablet Take 1 tablet (80 mg total) by mouth daily.  30 tablet  6   No current facility-administered medications on file prior to visit.    BP 140/80  Pulse 71  Wt 211 lb (95.709 kg)  BMI 32.09 kg/m2  SpO2 97%chart    Objective:   Physical Exam  Constitutional: He is oriented to person, place, and time. He appears well-developed and well-nourished.  HENT:  Right Ear: External ear normal.  Left Ear: External ear normal.  Nose: Nose normal.  Mouth/Throat: Oropharynx is clear and moist.  Neck: Normal range of motion. Neck  supple. No thyromegaly present.  Cardiovascular: Normal rate, regular rhythm and normal heart sounds.   Pulmonary/Chest: Effort normal and breath sounds normal.  Abdominal: Soft. Bowel sounds are normal.  Musculoskeletal: Normal range of motion.  Neurological: He is alert and oriented to person, place, and time. He has normal reflexes.  Skin: Skin is warm and dry.  Psychiatric: He has a normal mood and affect.          Assessment & Plan:  Assessment:  1. Hypertension 2. Type 2 diabetes 3. Hyperlipidemia 4. Coronary artery disease  Plan: We will request medical records from previous PCP along with labs. I've advised patient that he should consider colonoscopy screening as he's never had one done. We'll follow with patient and the  results of his labs, in 3 months, and sooner as needed.

## 2012-09-09 ENCOUNTER — Telehealth: Payer: Self-pay | Admitting: Internal Medicine

## 2012-09-09 ENCOUNTER — Other Ambulatory Visit: Payer: Self-pay

## 2012-09-09 MED ORDER — FREESTYLE LANCETS MISC: Status: DC

## 2012-09-09 NOTE — Telephone Encounter (Signed)
Rx sent to pharmacy   

## 2012-09-09 NOTE — Telephone Encounter (Signed)
Pt needs freestyle lancet call into [facility] [address]. Pt saw NP yesterday

## 2012-09-23 ENCOUNTER — Telehealth: Payer: Self-pay | Admitting: Family

## 2012-09-23 MED ORDER — INSULIN PEN NEEDLE 31G X 6 MM MISC: 1.0000 | Freq: Three times a day (TID) | Status: DC

## 2012-09-23 NOTE — Telephone Encounter (Signed)
Pt needs pen needles 6 mm x 31 gauge pt uses up to 5 times daily. Please call new rx  into [facility] [place] [address]

## 2012-09-23 NOTE — Telephone Encounter (Signed)
Done

## 2012-12-05 ENCOUNTER — Other Ambulatory Visit: Payer: Self-pay | Admitting: Cardiology

## 2012-12-09 ENCOUNTER — Telehealth: Payer: Self-pay | Admitting: Internal Medicine

## 2012-12-09 ENCOUNTER — Encounter: Payer: Self-pay | Admitting: Family

## 2012-12-09 ENCOUNTER — Ambulatory Visit (INDEPENDENT_AMBULATORY_CARE_PROVIDER_SITE_OTHER): Payer: Medicare HMO | Admitting: Family

## 2012-12-09 VITALS — BP 140/74 | HR 106 | Wt 206.0 lb

## 2012-12-09 DIAGNOSIS — I10 Essential (primary) hypertension: Secondary | ICD-10-CM

## 2012-12-09 DIAGNOSIS — E1165 Type 2 diabetes mellitus with hyperglycemia: Secondary | ICD-10-CM

## 2012-12-09 DIAGNOSIS — E7800 Pure hypercholesterolemia, unspecified: Secondary | ICD-10-CM

## 2012-12-09 NOTE — Progress Notes (Signed)
Subjective:    Patient ID: William Aguirre, male    DOB: 01-04-1935, 78 y.o.   MRN: [id number]  HPI  78 year old African American male, nonsmoker, patient of Dr. Cato Mulligan is in today for recheck of type 2 diabetes, hypertension, and hyperlipidemia. He is currently doing well. Denies any concerns today. Continues to see endocrinology and cardiology. Endocrinology manages his diabetes. He was recently switched from NovoLog 70/30 and regular insulin to Lantus 10 units at bedtime and NovoLog with meals. Blood glucose this morning 155.  Review of Systems  Constitutional: Negative.   HENT: Negative.   Respiratory: Negative.   Cardiovascular: Negative.   Gastrointestinal: Negative.   Endocrine: Negative.   Genitourinary: Negative.   Musculoskeletal: Negative.   Skin: Negative.   Neurological: Negative.   Hematological: Negative.   Psychiatric/Behavioral: Negative.    Past Medical History  Diagnosis Date  . Diabetes mellitus without complication   . Hyperlipidemia   . Coronary artery disease     Moderate LAD, Diagonal, Circumflex disease by cath 2004  . Hypertension     History   Social History  . Marital Status: Married    Spouse Name: N/A    Number of Children: 4  . Years of Education: N/A   Occupational History  . Accountant    Social History Main Topics  . Smoking status: Never Smoker   . Smokeless tobacco: Not on file  . Alcohol Use: No  . Drug Use: No  . Sexual Activity: Not on file   Other Topics Concern  . Not on file   Social History Narrative  . No narrative on file    Past Surgical History  Procedure Laterality Date  . None      Family History  Problem Relation Age of Onset  . CAD Neg Hx     No Known Allergies  Current Outpatient Prescriptions on File Prior to Visit  Medication Sig Dispense Refill  . aspirin 81 MG tablet Take 81 mg by mouth daily.      Marland Kitchen atorvastatin (LIPITOR) 80 MG tablet Take 1 tablet (80 mg total) by mouth daily.  30 tablet   6  . clopidogrel (PLAVIX) 75 MG tablet TAKE ONE TABLET BY MOUTH EVERY DAY  30 tablet  3  . DIOVAN 80 MG tablet TAKE ONE TABLET BY MOUTH EVERY DAY  30 tablet  3  . ezetimibe (ZETIA) 10 MG tablet Take 1 tablet (10 mg total) by mouth daily.  30 tablet  6  . glucose blood (FREESTYLE LITE) test strip Check blood glucose 3 times a day.  100 each  6  . insulin aspart (NOVOLOG) 100 UNIT/ML injection Inject 8 Units into the skin 3 (three) times daily before meals. NOVOLOG PEN  5 pen  4  . insulin glargine (LANTUS) 100 UNIT/ML injection Inject 0.1 mLs (10 Units total) into the skin at bedtime. lantus pen  One injection at night 10 units  10 mL  4  . Insulin Pen Needle 31G X 6 MM MISC Inject 1 each into the skin 3 (three) times daily.  100 each  1  . Lancets (FREESTYLE) lancets Use to check blood sugar three times daily  100 each  3  . metoprolol (LOPRESSOR) 50 MG tablet TAKE ONE TABLET BY MOUTH TWICE DAILY  60 tablet  3   No current facility-administered medications on file prior to visit.    BP 140/74  Pulse 106  Wt 206 lb (93.441 kg)  BMI 31.33 kg/m2chart  Objective:   Physical Exam  Constitutional: He is oriented to person, place, and time. He appears well-developed and well-nourished.  HENT:  Right Ear: External ear normal.  Left Ear: External ear normal.  Nose: Nose normal.  Mouth/Throat: Oropharynx is clear and moist.  Neck: Normal range of motion. Neck supple. No thyromegaly present.  Cardiovascular: Normal rate, regular rhythm and normal heart sounds.   Pulmonary/Chest: Effort normal and breath sounds normal.  Abdominal: Soft. Bowel sounds are normal.  Musculoskeletal: Normal range of motion.  Neurological: He is alert and oriented to person, place, and time.  Skin: Skin is warm and dry.  Psychiatric: He has a normal mood and affect.          Assessment & Plan:  Assessment:  1. hyperlipidemia 2. Type 2 diabetes 3. Hypertension  Plan: We'll obtain labs from  endocrinology. Followup in 6 months and sooner as needed. Encouraged healthy diet and exercise. Weight reduction.

## 2012-12-09 NOTE — Telephone Encounter (Signed)
Pt is requesting to switch PCP from Dr. Cato Mulligan to Adline Mango. May I schedule this? Thank you!

## 2012-12-09 NOTE — Telephone Encounter (Signed)
ok 

## 2012-12-13 NOTE — Telephone Encounter (Signed)
ok 

## 2013-01-02 ENCOUNTER — Other Ambulatory Visit: Payer: Self-pay | Admitting: Cardiology

## 2013-01-09 ENCOUNTER — Ambulatory Visit (INDEPENDENT_AMBULATORY_CARE_PROVIDER_SITE_OTHER): Payer: Medicare HMO | Admitting: Family

## 2013-01-09 ENCOUNTER — Encounter: Payer: Self-pay | Admitting: Family

## 2013-01-09 ENCOUNTER — Ambulatory Visit (INDEPENDENT_AMBULATORY_CARE_PROVIDER_SITE_OTHER)
Admission: RE | Admit: 2013-01-09 | Discharge: 2013-01-09 | Disposition: A | Discharge status: Home / Self Care | Payer: Medicare HMO | Source: Ambulatory Visit | Attending: Family | Admitting: Family

## 2013-01-09 VITALS — BP 138/78 | HR 64 | Wt 206.0 lb

## 2013-01-09 DIAGNOSIS — M25511 Pain in right shoulder: Secondary | ICD-10-CM

## 2013-01-09 DIAGNOSIS — R071 Chest pain on breathing: Secondary | ICD-10-CM

## 2013-01-09 DIAGNOSIS — M25569 Pain in unspecified knee: Secondary | ICD-10-CM

## 2013-01-09 DIAGNOSIS — M25561 Pain in right knee: Secondary | ICD-10-CM

## 2013-01-09 DIAGNOSIS — W1809XA Striking against other object with subsequent fall, initial encounter: Secondary | ICD-10-CM

## 2013-01-09 DIAGNOSIS — R0789 Other chest pain: Secondary | ICD-10-CM

## 2013-01-09 DIAGNOSIS — M25519 Pain in unspecified shoulder: Secondary | ICD-10-CM

## 2013-01-09 MED ORDER — NITROFURANTOIN MONOHYD MACRO 100 MG PO CAPS: 100.0000 mg | ORAL_CAPSULE | Freq: Two times a day (BID) | ORAL | Status: DC

## 2013-01-09 MED ORDER — CYCLOBENZAPRINE HCL 5 MG PO TABS: 5.0000 mg | ORAL_TABLET | Freq: Three times a day (TID) | ORAL | Status: DC | PRN

## 2013-01-09 NOTE — Progress Notes (Signed)
Subjective:    Patient ID: William Aguirre, male    DOB: 1934-10-03, 78 y.o.   MRN: [id number]  HPI  78 year old African American male, nonsmoker, is in today after reportedly having a fall on Friday, 01/06/2013 at [facility]. Reports that he tripped over a crate that had pumpkins on it landing on his right side. He subsequently has right shoulder pain, right chest wall pain, and right knee pain. His wife reports him have an equilibrium problem recently, being off balance. He rates the pain in his right knee 6/10, pain in the right shoulder 6/10. The pain is worse with movement. Has been taking over-the-counter, to help him sleep. He describes it as a dull aching constant.    Review of Systems  Constitutional: Negative.   Respiratory: Negative.   Cardiovascular: Negative.   Gastrointestinal: Negative.   Genitourinary: Negative.   Musculoskeletal: Positive for arthralgias.       Right chest wall pain, right knee pain,  Skin: Negative.   Neurological: Negative.  Negative for weakness.  Psychiatric/Behavioral: Negative.    Past Medical History  Diagnosis Date  . Diabetes mellitus without complication   . Hyperlipidemia   . Coronary artery disease     Moderate LAD, Diagonal, Circumflex disease by cath 2004  . Hypertension     History   Social History  . Marital Status: Married    Spouse Name: N/A    Number of Children: 4  . Years of Education: N/A   Occupational History  . Accountant    Social History Main Topics  . Smoking status: Never Smoker   . Smokeless tobacco: Not on file  . Alcohol Use: No  . Drug Use: No  . Sexual Activity: Not on file   Other Topics Concern  . Not on file   Social History Narrative  . No narrative on file    Past Surgical History  Procedure Laterality Date  . None      Family History  Problem Relation Age of Onset  . CAD Neg Hx     No Known Allergies  Current Outpatient Prescriptions on File Prior to Visit  Medication Sig  Dispense Refill  . aspirin 81 MG tablet Take 81 mg by mouth daily.      Marland Kitchen atorvastatin (LIPITOR) 80 MG tablet TAKE ONE TABLET BY MOUTH EVERY DAY  30 tablet  0  . clopidogrel (PLAVIX) 75 MG tablet TAKE ONE TABLET BY MOUTH EVERY DAY  30 tablet  3  . DIOVAN 80 MG tablet TAKE ONE TABLET BY MOUTH EVERY DAY  30 tablet  3  . glucose blood (FREESTYLE LITE) test strip Check blood glucose 3 times a day.  100 each  6  . insulin aspart (NOVOLOG) 100 UNIT/ML injection Inject 8 Units into the skin 3 (three) times daily before meals. NOVOLOG PEN  5 pen  4  . insulin glargine (LANTUS) 100 UNIT/ML injection Inject 0.1 mLs (10 Units total) into the skin at bedtime. lantus pen  One injection at night 10 units  10 mL  4  . Insulin Pen Needle 31G X 6 MM MISC Inject 1 each into the skin 3 (three) times daily.  100 each  1  . Lancets (FREESTYLE) lancets Use to check blood sugar three times daily  100 each  3  . metoprolol (LOPRESSOR) 50 MG tablet TAKE ONE TABLET BY MOUTH TWICE DAILY  60 tablet  3  . ZETIA 10 MG tablet TAKE ONE TABLET BY MOUTH EVERY DAY  30 tablet  0   No current facility-administered medications on file prior to visit.    BP 138/78  Pulse 64  Wt 206 lb (93.441 kg)  BMI 31.33 kg/m2chart    Objective:   Physical Exam  Constitutional: He is oriented to person, place, and time. He appears well-developed and well-nourished.  Neck: Normal range of motion. Neck supple.  Cardiovascular: Normal rate, regular rhythm and normal heart sounds.   Pulmonary/Chest: Effort normal and breath sounds normal.  Musculoskeletal: He exhibits tenderness.  Tenderness to palpation of the right lateral chest wall at approximately the sixth rib. Full range of motion of the right knee with no pain. No effusion. Right shoulder: Full range of motion of the right shoulder with no pain. Tenderness elicited to palpation of the right shoulder anteriorly. No swelling.  Neurological: He is alert and oriented to person, place, and  time. He has normal reflexes. He displays normal reflexes. No cranial nerve deficit. Coordination normal.  Skin: Skin is warm and dry.  Psychiatric: He has a normal mood and affect.          Assessment & Plan:  Assessment: 1. Fall, initial encounter 2. Right shoulder pain 3. Right knee pain 4. Right chest wall pain   Plan: X-ray of the right chest wall to rule out rib fracture an x-ray of the right knee. Will notify patient of the results. Ice to the affected area. Continue Tylenol as needed.

## 2013-01-09 NOTE — Patient Instructions (Addendum)

## 2013-01-17 ENCOUNTER — Telehealth: Payer: Self-pay | Admitting: Family

## 2013-01-17 DIAGNOSIS — M25511 Pain in right shoulder: Secondary | ICD-10-CM

## 2013-01-17 NOTE — Telephone Encounter (Signed)
Refer to ortho.

## 2013-01-17 NOTE — Telephone Encounter (Signed)
Please advise 

## 2013-01-17 NOTE — Telephone Encounter (Signed)
Pt is still having a sharp pain in his right side from his fall. Wife states the xray machine was old they took his xray on and image was poor. pls call pt at [phone] ext [phone]  to advise on what he should do now.

## 2013-01-18 NOTE — Telephone Encounter (Signed)
Pt aware.

## 2013-02-08 ENCOUNTER — Ambulatory Visit: Payer: Medicare HMO | Admitting: Family

## 2013-03-01 ENCOUNTER — Encounter: Payer: Self-pay | Admitting: Cardiovascular Disease

## 2013-03-01 ENCOUNTER — Ambulatory Visit (INDEPENDENT_AMBULATORY_CARE_PROVIDER_SITE_OTHER): Payer: Medicare HMO | Admitting: Cardiovascular Disease

## 2013-03-01 VITALS — BP 138/75 | HR 72 | Ht 68.0 in | Wt 206.0 lb

## 2013-03-01 DIAGNOSIS — E119 Type 2 diabetes mellitus without complications: Secondary | ICD-10-CM

## 2013-03-01 DIAGNOSIS — E785 Hyperlipidemia, unspecified: Secondary | ICD-10-CM

## 2013-03-01 DIAGNOSIS — I10 Essential (primary) hypertension: Secondary | ICD-10-CM

## 2013-03-01 DIAGNOSIS — R011 Cardiac murmur, unspecified: Secondary | ICD-10-CM

## 2013-03-01 DIAGNOSIS — I2510 Atherosclerotic heart disease of native coronary artery without angina pectoris: Secondary | ICD-10-CM

## 2013-03-01 MED ORDER — METOPROLOL TARTRATE 50 MG PO TABS: 50.0000 mg | ORAL_TABLET | Freq: Two times a day (BID) | ORAL | Status: DC

## 2013-03-01 NOTE — Patient Instructions (Addendum)
Your physician wants you to follow-up in:  6 months.  You will receive a reminder letter in the mail two months in advance. If you don't receive a letter, please call our office to schedule the follow-up appointment.  Your physician has requested that you have an echocardiogram. Echocardiography is a painless test that uses sound waves to create images of your heart. It provides your doctor with information about the size and shape of your heart and how well your heart's chambers and valves are working. This procedure takes approximately one hour. There are no restrictions for this procedure.   Make sure you take metoprolol tartrate 50 mg by mouth twice daily

## 2013-03-01 NOTE — Progress Notes (Signed)
History of Present Illness: 78 yo male with history of CAD, HTN, HLD, DM who is here today for cardiac follow up. He has been followed in the past by Dr. Riley Kill. His last cath was in 2004 and showed moderate LAD, Diagonal, Circumflex disease with diabetic appearance of vessels (see below). He has been managed medically since then. Exercise stress test 05/31/12 and he exercised for 3 minutes and stopped due to fatigue. He and Dr. Riley Kill discussed arranging a stress myoview but the patient declined. He had a fall after tripping over a crate in [employer] in September 2014 and hurt his right shoulder.  He is here today for follow up. He notes mild pressure in his upper chest when bringing in groceries. Resolves quickly with rest. He denies any SOB. He is feeling well. No lower extremity edema.   Primary Care Physician: Birdie Sons  Last Lipid Profile: Followed in primary care  Past Medical History  Diagnosis Date  . Diabetes mellitus without complication   . Hyperlipidemia   . Coronary artery disease     Moderate LAD, Diagonal, Circumflex disease by cath 2004  . Hypertension     Past Surgical History  Procedure Laterality Date  . None      Current Outpatient Prescriptions  Medication Sig Dispense Refill  . aspirin 81 MG tablet Take 81 mg by mouth daily.      Marland Kitchen atorvastatin (LIPITOR) 80 MG tablet TAKE ONE TABLET BY MOUTH EVERY DAY  30 tablet  0  . clopidogrel (PLAVIX) 75 MG tablet TAKE ONE TABLET BY MOUTH EVERY DAY  30 tablet  3  . cyclobenzaprine (FLEXERIL) 5 MG tablet Take 1 tablet (5 mg total) by mouth 3 (three) times daily as needed for muscle spasms.  30 tablet  1  . DIOVAN 80 MG tablet TAKE ONE TABLET BY MOUTH EVERY DAY  30 tablet  3  . glucose blood (FREESTYLE LITE) test strip Check blood glucose 3 times a day.  100 each  6  . insulin aspart (NOVOLOG) 100 UNIT/ML injection Inject 8 Units into the skin 3 (three) times daily before meals. NOVOLOG PEN  5 pen  4  . insulin  glargine (LANTUS) 100 UNIT/ML injection Inject 0.1 mLs (10 Units total) into the skin at bedtime. lantus pen  One injection at night 10 units  10 mL  4  . Insulin Pen Needle 31G X 6 MM MISC Inject 1 each into the skin 3 (three) times daily.  100 each  1  . Lancets (FREESTYLE) lancets Use to check blood sugar three times daily  100 each  3  . metoprolol (LOPRESSOR) 50 MG tablet take two tabs (together) in the am      . ZETIA 10 MG tablet TAKE ONE TABLET BY MOUTH EVERY DAY  30 tablet  0   No current facility-administered medications for this visit.    No Known Allergies  History   Social History  . Marital Status: Married    Spouse Name: N/A    Number of Children: 4  . Years of Education: N/A   Occupational History  . Accountant    Social History Main Topics  . Smoking status: Never Smoker   . Smokeless tobacco: Not on file  . Alcohol Use: No  . Drug Use: No  . Sexual Activity: Not on file   Other Topics Concern  . Not on file   Social History Narrative  . No narrative on file    Family  History  Problem Relation Age of Onset  . CAD Neg Hx     Review of Systems:  As stated in the HPI and otherwise negative.   BP 138/75  Pulse 72  Ht 5\' 8"  (1.727 m)  Wt 206 lb (93.441 kg)  BMI 31.33 kg/m2  Physical Examination: General: Well developed, well nourished, NAD HEENT: OP clear, mucus membranes moist SKIN: warm, dry. No rashes. Neuro: No focal deficits Musculoskeletal: Muscle strength 5/5 all ext Psychiatric: Mood and affect normal Neck: No JVD, no carotid bruits, no thyromegaly, no lymphadenopathy. Lungs:Clear bilaterally, no wheezes, rhonci, crackles Cardiovascular: Regular rate and rhythm. No murmurs, gallops or rubs. Abdomen:Soft. Bowel sounds present. Non-tender.  Extremities: No lower extremity edema. Pulses are 2 + in the bilateral DP/PT.  Cardiac cath December 2004: 1. Ventriculography was performed in the RAO projection. Overall systolic  function was  mildly reduced. Ejection fraction was calculated at 47% but  appeared to be visually better. There was hypokinesis of the inferobasal  segment. No significant mitral regurgitation was noted.  2. The left main coronary artery was free of critical disease.  3. The left anterior descending artery coursed to the apex. The LAD and its  branch had a fairly typical diabetic appearance with multiple areas of  luminal irregularity. Beyond the origin of the major diagonal branch was  a 40-50% area of focal stenosis that was slightly hypodense that appeared  to be less severe in some views than others. There was also a 30-40%  ostial narrowing of the major diagonal branch. There was moderate  plaquing noted at the apical portion of the LAD as well; critical  stenoses, however, were not noted.  4. The circumflex is a dominant vessel. There is a first marginal branch  that has a typical diabetic appearance and a focal area of about 60%  narrowing. There is also a second marginal branch that bifurcates  distally and again has diffuse luminal irregularities compatible with  diabetes. The A-V circumflex courses to the posterior wall, where it  supplies two posterolateral branches and then is totally occluded,  functioning as a posterior descending branch. In the initial RAO films,  there was evidence of some collateralization of the distal PDA, and this  had an appearance of being somewhat diffusely irregular.  5. The right coronary artery was a nondominant vessel that was free of  critical disease.  CONCLUSIONS:  1. Mild reduction in left ventricular function with an inferobasal wall  motion abnormality but with hypokinesis and not akinesis.  2. Currently normal electrocardiogram.  3. Total occlusion of the distal circumflex prior to the posterior  descending branch with early collateralization of this vessel.  4. Moderate disease of the left anterior descending and diagonal system.  5. Typical  diabetic appearance of the coronary arteries.   Assessment and Plan:   1. CAD: Overall stable. He only has angina with heavy exertion. I have discussed arranging a stress myoview to exclude ischemia but he does not wish to do so at this time. No changes in therapy. Continue ASA/Plavix/statin/beta blocker. He will call with a change in his symptoms. Echo to assess LVEF.   2. Hyperlipidemia: He is on a statin and Zetia. Followed in primary care.   3. HTN: BP well controlled. No changes today.   4. Cardiac murmur: Systolic murmur on exam. Will arrange echo to assess.   5. Diabetes mellitus: Managed in primary care. Reports good control of blood sugars.

## 2013-03-02 ENCOUNTER — Other Ambulatory Visit: Payer: Self-pay | Admitting: Cardiovascular Disease

## 2013-03-04 ENCOUNTER — Other Ambulatory Visit: Payer: Self-pay | Admitting: Cardiovascular Disease

## 2013-03-15 ENCOUNTER — Ambulatory Visit (HOSPITAL_COMMUNITY): Payer: Medicare HMO | Attending: Cardiovascular Disease | Admitting: Radiology

## 2013-03-15 ENCOUNTER — Encounter: Payer: Self-pay | Admitting: Cardiology

## 2013-03-15 DIAGNOSIS — I2510 Atherosclerotic heart disease of native coronary artery without angina pectoris: Secondary | ICD-10-CM | POA: Insufficient documentation

## 2013-03-15 DIAGNOSIS — I10 Essential (primary) hypertension: Secondary | ICD-10-CM | POA: Insufficient documentation

## 2013-03-15 DIAGNOSIS — I379 Nonrheumatic pulmonary valve disorder, unspecified: Secondary | ICD-10-CM | POA: Insufficient documentation

## 2013-03-15 DIAGNOSIS — E119 Type 2 diabetes mellitus without complications: Secondary | ICD-10-CM | POA: Insufficient documentation

## 2013-03-15 DIAGNOSIS — R011 Cardiac murmur, unspecified: Secondary | ICD-10-CM | POA: Insufficient documentation

## 2013-03-15 DIAGNOSIS — R0602 Shortness of breath: Secondary | ICD-10-CM | POA: Insufficient documentation

## 2013-03-15 DIAGNOSIS — E785 Hyperlipidemia, unspecified: Secondary | ICD-10-CM | POA: Insufficient documentation

## 2013-03-15 NOTE — Progress Notes (Signed)
Echocardiogram performed.  

## 2013-03-16 ENCOUNTER — Other Ambulatory Visit (HOSPITAL_COMMUNITY): Payer: Medicare HMO

## 2013-04-03 ENCOUNTER — Other Ambulatory Visit: Payer: Self-pay | Admitting: Cardiovascular Disease

## 2013-04-04 ENCOUNTER — Other Ambulatory Visit: Payer: Self-pay | Admitting: *Deleted

## 2013-04-04 ENCOUNTER — Other Ambulatory Visit: Payer: Self-pay

## 2013-04-04 MED ORDER — DIOVAN 80 MG PO TABS: ORAL_TABLET | ORAL | Status: DC

## 2013-04-04 MED ORDER — VALSARTAN 80 MG PO TABS: 80.0000 mg | ORAL_TABLET | Freq: Every day | ORAL | Status: DC

## 2013-04-24 ENCOUNTER — Emergency Department (HOSPITAL_COMMUNITY): Payer: No Typology Code available for payment source

## 2013-04-24 ENCOUNTER — Emergency Department (HOSPITAL_COMMUNITY)
Admission: EM | Admit: 2013-04-24 | Discharge: 2013-04-24 | Disposition: A | Discharge status: Home / Self Care | Payer: No Typology Code available for payment source | Attending: Emergency Medicine | Admitting: Emergency Medicine

## 2013-04-24 DIAGNOSIS — W19XXXA Unspecified fall, initial encounter: Secondary | ICD-10-CM

## 2013-04-24 DIAGNOSIS — E119 Type 2 diabetes mellitus without complications: Secondary | ICD-10-CM | POA: Insufficient documentation

## 2013-04-24 DIAGNOSIS — Y9389 Activity, other specified: Secondary | ICD-10-CM | POA: Insufficient documentation

## 2013-04-24 DIAGNOSIS — Z7902 Long term (current) use of antithrombotics/antiplatelets: Secondary | ICD-10-CM | POA: Insufficient documentation

## 2013-04-24 DIAGNOSIS — I2510 Atherosclerotic heart disease of native coronary artery without angina pectoris: Secondary | ICD-10-CM | POA: Insufficient documentation

## 2013-04-24 DIAGNOSIS — I10 Essential (primary) hypertension: Secondary | ICD-10-CM | POA: Insufficient documentation

## 2013-04-24 DIAGNOSIS — Y929 Unspecified place or not applicable: Secondary | ICD-10-CM | POA: Insufficient documentation

## 2013-04-24 DIAGNOSIS — IMO0002 Reserved for concepts with insufficient information to code with codable children: Secondary | ICD-10-CM | POA: Insufficient documentation

## 2013-04-24 DIAGNOSIS — E785 Hyperlipidemia, unspecified: Secondary | ICD-10-CM | POA: Insufficient documentation

## 2013-04-24 DIAGNOSIS — Z794 Long term (current) use of insulin: Secondary | ICD-10-CM | POA: Insufficient documentation

## 2013-04-24 DIAGNOSIS — Z79899 Other long term (current) drug therapy: Secondary | ICD-10-CM | POA: Insufficient documentation

## 2013-04-24 DIAGNOSIS — Z7982 Long term (current) use of aspirin: Secondary | ICD-10-CM | POA: Insufficient documentation

## 2013-04-24 DIAGNOSIS — W010XXA Fall on same level from slipping, tripping and stumbling without subsequent striking against object, initial encounter: Secondary | ICD-10-CM | POA: Insufficient documentation

## 2013-04-24 MED ORDER — HYDROCODONE-ACETAMINOPHEN 5-325 MG PO TABS: 2.0000 | ORAL_TABLET | ORAL | Status: DC | PRN

## 2013-04-24 NOTE — ED Notes (Signed)
Pt taken ginger ale, crackers and peanut butter per pt request. Side rails down for pt to get dressed for discharge.

## 2013-04-24 NOTE — ED Provider Notes (Signed)
CSN: [id number]     Arrival date & time 04/24/13  1524 History   First MD Initiated Contact with Patient 04/24/13 1537     Chief Complaint  Patient presents with  . Fall   HPI Pt slipped and fell on wet concrete approx. 30 mins ago. Initially felt pain in lower back but now feels no pain. Ambulatory. Alert and oriented. Is on blood thinners. No LOC. No head trauma.   Past Medical History  Diagnosis Date  . Diabetes mellitus without complication   . Hyperlipidemia   . Coronary artery disease     Moderate LAD, Diagonal, Circumflex disease by cath 2004  . Hypertension    Past Surgical History  Procedure Laterality Date  . None     Family History  Problem Relation Age of Onset  . CAD Neg Hx    History  Substance Use Topics  . Smoking status: Never Smoker   . Smokeless tobacco: Not on file  . Alcohol Use: No    Review of Systems All other systems reviewed and are negative Allergies  Review of patient's allergies indicates no known allergies.  Home Medications   Current Outpatient Rx  Name  Route  Sig  Dispense  Refill  . aspirin 81 MG tablet   Oral   Take 81 mg by mouth daily.         Marland Kitchen. atorvastatin (LIPITOR) 80 MG tablet   Oral   Take 80 mg by mouth daily.         . clopidogrel (PLAVIX) 75 MG tablet   Oral   Take 75 mg by mouth daily with breakfast.         . ezetimibe (ZETIA) 10 MG tablet   Oral   Take 10 mg by mouth daily.         . insulin aspart (NOVOLOG) 100 UNIT/ML injection   Subcutaneous   Inject 6 Units into the skin 3 (three) times daily before meals. NOVOLOG PEN         . insulin glargine (LANTUS) 100 UNIT/ML injection   Subcutaneous   Inject 12 Units into the skin at bedtime. lantus pen  One injection at night 10 units         . metoprolol (LOPRESSOR) 50 MG tablet   Oral   Take 1 tablet (50 mg total) by mouth 2 (two) times daily.   60 tablet   11   . valsartan (DIOVAN) 80 MG tablet   Oral   Take 80 mg by mouth daily.        Marland Kitchen. HYDROcodone-acetaminophen (NORCO/VICODIN) 5-325 MG per tablet   Oral   Take 2 tablets by mouth every 4 (four) hours as needed.   10 tablet   0    BP 170/92  Pulse 64  Temp(Src) 98.3 F (36.8 C) (Oral)  Resp 18  SpO2 100% Physical Exam  Nursing note and vitals reviewed. Constitutional: He is oriented to person, place, and time. He appears well-developed and well-nourished. No distress.  HENT:  Head: Normocephalic and atraumatic.  Eyes: Pupils are equal, round, and reactive to light.  Neck: Normal range of motion.  Cardiovascular: Normal rate and intact distal pulses.   Pulmonary/Chest: No respiratory distress.  Abdominal: Normal appearance. He exhibits no distension.  Musculoskeletal: Normal range of motion.       Lumbar back: He exhibits tenderness. He exhibits no spasm.       Back:  Neurological: He is alert and oriented to person, place, and  time. No cranial nerve deficit.  Skin: Skin is warm and dry. No rash noted.  Psychiatric: He has a normal mood and affect. His behavior is normal.    ED Course  Procedures (including critical care time) Labs Review Labs Reviewed - No data to display Imaging Review Dg Lumbar Spine Complete  IMPRESSION:  1. No acute osseous injury of the lumbar spine.  2. Lumbar spine spondylosis at L4-5 and L5-S1.     Electronically Signed   By: Elige Ko    On: 04/24/2013 16:52      MDM   1. Fall, initial encounter        Nelia Shi, MD 04/24/13 [phone]

## 2013-04-24 NOTE — Discharge Instructions (Signed)
Fall Prevention and Home Safety °Falls cause injuries and can affect all age groups. It is possible to prevent falls.  °HOW TO PREVENT FALLS °· Wear shoes with rubber soles that do not have an opening for your toes. °· Keep the inside and outside of your house well lit. °· Use night lights throughout your home. °· Remove clutter from floors. °· Clean up floor spills. °· Remove throw rugs or fasten them to the floor with carpet tape. °· Do not place electrical cords across pathways. °· Put grab bars by your tub, shower, and toilet. Do not use towel bars as grab bars. °· Put handrails on both sides of the stairway. Fix loose handrails. °· Do not climb on stools or stepladders, if possible. °· Do not wax your floors. °· Repair uneven or unsafe sidewalks, walkways, or stairs. °· Keep items you use a lot within reach. °· Be aware of pets. °· Keep emergency numbers next to the telephone. °· Put smoke detectors in your home and near bedrooms. °Ask your doctor what other things you can do to prevent falls. °Document Released: 01/24/2009 Document Revised: 09/29/2011 Document Reviewed: 06/30/2011 °ExitCare® Patient Information ©2014 [employer]. ° °

## 2013-04-24 NOTE — ED Notes (Signed)
Pt slipped and fell on wet concrete approx. 30 mins ago. Initially felt pain in lower back but now feels no pain. Ambulatory. Alert and oriented. Is on blood thinners. No LOC. No head trauma.

## 2013-04-24 NOTE — ED Notes (Signed)
Bed: WA01 Expected date:  Expected time:  Means of arrival:  Comments: EMS- Fall 

## 2013-04-26 ENCOUNTER — Telehealth: Payer: Self-pay | Admitting: Family

## 2013-04-26 NOTE — Telephone Encounter (Signed)
Pt's wife calling on behalf of pt requesting results of x-ray done on Monday after pt fell.  She also states pt is still experiencing some pain.

## 2013-04-26 NOTE — Telephone Encounter (Signed)
Left message for pt's wife to advise her of Padonda's note

## 2013-04-26 NOTE — Telephone Encounter (Signed)
Please advise 

## 2013-04-26 NOTE — Telephone Encounter (Signed)
Rib xray: Remote right sixth rib fracture. Otherwise normal  Right knee: Small effusion right knee. Arthritis.

## 2013-04-26 NOTE — Telephone Encounter (Signed)
1. No acute osseous injury of the lumbar spine.  2. Lumbar spine spondylosis at L4-5 and L5-S1.   No new injury

## 2013-04-26 NOTE — Telephone Encounter (Signed)
Wife states the xray results that were given to her were  old ones. [facility] [place] [facility]

## 2013-04-27 NOTE — Telephone Encounter (Signed)
Left message to advise wife of results

## 2013-05-01 ENCOUNTER — Telehealth: Payer: Self-pay | Admitting: Family

## 2013-05-01 DIAGNOSIS — R296 Repeated falls: Secondary | ICD-10-CM

## 2013-05-01 NOTE — Telephone Encounter (Signed)
Patient Information:  Caller Name: William CalicoFrances  Phone: [phone]  Patient: William Aguirre, William Aguirre  Gender: Male  DOB: Nov 19, 1934  Age: 79 Years  PCP: William Aguirre, William Aguirre(Family Practice)  Office Follow Up:  Does the office need to follow up with this patient?: Yes  Instructions For The Office: Spoke with William Aguirre in office - sending note for office response concerning Appt. Please review, can contact wife/William Aguirre on cell   [phone]   Symptoms  Reason For Call & Symptoms: Fall on Wed 1/14 in garage slipped on water on floor fell on to concrete, seen in ER with CT Scan.  Larey SeatFell again on Sat 1/17 while walking in bedroom in dark, fell landing on Right side.  Since then dragging Right foot while using quad-cane - still difficulty walking. Has had episode in past when he was dragging  right foot but not for some time.  Had been hallucinating on Sat 1/19 - asking wife if she could see the man in the room (she could not) and telling her about the children that were playiing, tied him up and would not give him any food.  Reviewed Health History In EMR: Yes  Reviewed Medications In EMR: Yes  Reviewed Allergies In EMR: Yes  Reviewed Surgeries / Procedures: Yes  Date of Onset of Symptoms: 04/29/2013  Guideline(Aguirre) Used:  Neurologic Deficit  Disposition Per Guideline:   Go to Office Now  Reason For Disposition Reached:   Neurologic deficit that was transient (now gone), ANY of the following:   Weakness of the face, arm, or leg on one side of the body  Numbness of the face, arm, or leg on one side of the body  Loss of speech or garbled speech  Advice Given:  N/A  Patient Will Follow Care Advice:  YES

## 2013-05-01 NOTE — Telephone Encounter (Signed)
Pt's wife refuses to take pt to ER because she states that he is not of emergency status and has to work because it is tax season. I advised pt's wife that based upon the note, Oran Reinadonda suggests that he be seen in the ED. Sent to scheduling for appointment.  Spoke with Padonda. Refer pt to neurology for frequent falls.  Pt aware of referral

## 2013-05-08 ENCOUNTER — Encounter: Payer: Self-pay | Admitting: Family

## 2013-05-08 ENCOUNTER — Other Ambulatory Visit: Payer: Self-pay | Admitting: Family

## 2013-05-08 ENCOUNTER — Ambulatory Visit (INDEPENDENT_AMBULATORY_CARE_PROVIDER_SITE_OTHER): Payer: Medicare HMO | Admitting: Family

## 2013-05-08 VITALS — BP 172/96 | HR 73 | Ht 68.0 in | Wt 201.0 lb

## 2013-05-08 DIAGNOSIS — M545 Low back pain, unspecified: Secondary | ICD-10-CM

## 2013-05-08 DIAGNOSIS — E119 Type 2 diabetes mellitus without complications: Secondary | ICD-10-CM

## 2013-05-08 DIAGNOSIS — R269 Unspecified abnormalities of gait and mobility: Secondary | ICD-10-CM

## 2013-05-08 DIAGNOSIS — R2681 Unsteadiness on feet: Secondary | ICD-10-CM | POA: Insufficient documentation

## 2013-05-08 DIAGNOSIS — R5383 Other fatigue: Secondary | ICD-10-CM

## 2013-05-08 DIAGNOSIS — R531 Weakness: Secondary | ICD-10-CM | POA: Insufficient documentation

## 2013-05-08 DIAGNOSIS — K5900 Constipation, unspecified: Secondary | ICD-10-CM

## 2013-05-08 DIAGNOSIS — R5381 Other malaise: Secondary | ICD-10-CM

## 2013-05-08 DIAGNOSIS — E118 Type 2 diabetes mellitus with unspecified complications: Secondary | ICD-10-CM

## 2013-05-08 LAB — BASIC METABOLIC PANEL
BUN: 12 mg/dL (ref 6–23)
CO2: 28 mEq/L (ref 19–32)
Calcium: 9.4 mg/dL (ref 8.4–10.5)
Chloride: 102 mEq/L (ref 96–112)
Creatinine, Ser: 1.1 mg/dL (ref 0.4–1.5)
GFR: 82.65 mL/min (ref >60.00)
GLUCOSE: 224 mg/dL — AB (ref 70–99)
POTASSIUM: 4.9 meq/L (ref 3.5–5.1)
SODIUM: 136 meq/L (ref 135–145)

## 2013-05-08 LAB — HEPATIC FUNCTION PANEL
ALK PHOS: 110 U/L (ref 39–117)
ALT: 22 U/L (ref 0–53)
AST: 16 U/L (ref 0–37)
Albumin: 3.7 g/dL (ref 3.5–5.2)
BILIRUBIN DIRECT: 0.1 mg/dL (ref 0.0–0.3)
Total Bilirubin: 0.6 mg/dL (ref 0.3–1.2)
Total Protein: 7.7 g/dL (ref 6.0–8.3)

## 2013-05-08 LAB — POCT URINALYSIS DIPSTICK
Bilirubin, UA: NEGATIVE
Blood, UA: NEGATIVE
Ketones, UA: NEGATIVE
Leukocytes, UA: NEGATIVE
Nitrite, UA: NEGATIVE
Protein, UA: NEGATIVE
SPEC GRAV UA: 1.02
Urobilinogen, UA: 0.2
pH, UA: 6

## 2013-05-08 LAB — VITAMIN B12: VITAMIN B 12: 435 pg/mL (ref 211–911)

## 2013-05-08 LAB — TSH: TSH: 1.62 u[IU]/mL (ref 0.35–5.50)

## 2013-05-08 LAB — HEMOGLOBIN A1C: HEMOGLOBIN A1C: 13.8 % — AB (ref 4.6–6.5)

## 2013-05-08 MED ORDER — TRAMADOL HCL 50 MG PO TABS: 50.0000 mg | ORAL_TABLET | Freq: Three times a day (TID) | ORAL | Status: DC | PRN

## 2013-05-08 NOTE — Patient Instructions (Signed)
Weakness  Weakness is a lack of strength. It may be felt all over the body (generalized) or in one specific part of the body (focal). Some causes of weakness can be serious. You may need further medical evaluation, especially if you are elderly or you have a history of immunosuppression (such as chemotherapy or HIV), kidney disease, heart disease, or diabetes.  CAUSES   Weakness can be caused by many different things, including:  · Infection.  · Physical exhaustion.  · Internal bleeding or other blood loss that results in a lack of red blood cells (anemia).  · Dehydration. This cause is more common in elderly people.  · Side effects or electrolyte abnormalities from medicines, such as pain medicines or sedatives.  · Emotional distress, anxiety, or depression.  · Circulation problems, especially severe peripheral arterial disease.  · Heart disease, such as rapid atrial fibrillation, bradycardia, or heart failure.  · Nervous system disorders, such as Guillain-Barré syndrome, multiple sclerosis, or stroke.  DIAGNOSIS   To find the cause of your weakness, your caregiver will take your history and perform a physical exam. Lab tests or X-rays may also be ordered, if needed.  TREATMENT   Treatment of weakness depends on the cause of your symptoms and can vary greatly.  HOME CARE INSTRUCTIONS   · Rest as needed.  · Eat a well-balanced diet.  · Try to get some exercise every day.  · Only take over-the-counter or prescription medicines as directed by your caregiver.  SEEK MEDICAL CARE IF:   · Your weakness seems to be getting worse or spreads to other parts of your body.  · You develop new aches or pains.  SEEK IMMEDIATE MEDICAL CARE IF:   · You cannot perform your normal daily activities, such as getting dressed and feeding yourself.  · You cannot walk up and down stairs, or you feel exhausted when you do so.  · You have shortness of breath or chest pain.  · You have difficulty moving parts of your body.  · You have weakness  in only one area of the body or on only one side of the body.  · You have a fever.  · You have trouble speaking or swallowing.  · You cannot control your bladder or bowel movements.  · You have black or bloody vomit or stools.  MAKE SURE YOU:  · Understand these instructions.  · Will watch your condition.  · Will get help right away if you are not doing well or get worse.  Document Released: 03/30/2005 Document Revised: 09/29/2011 Document Reviewed: 05/29/2011  ExitCare® Patient Information ©2014 [employer].

## 2013-05-08 NOTE — Progress Notes (Signed)
Pre visit review using our clinic review tool, if applicable. No additional management support is needed unless otherwise documented below in the visit note. 

## 2013-05-08 NOTE — Progress Notes (Signed)
Subjective:    Patient ID: William Aguirre, male    DOB: 12-27-1934, 79 y.o.   MRN: [id number]  HPI  William Aguirre is a 79 y.o. Black male that presents today accompanies by his wife with chief complaint of "falls". The patient had one fall in September when he fell at [facility] while tripping over a crate at a pumpkin display. He had a most recent fall in January, when he "slipped on the wet cement", the patient went to the Emergency department for this fall. His wife reports that pt has had "mood swings", increased weakness and loss of independence since his fall. Pt reports increased pain to lower back since fall, rating it an 11 on a scale of 1-10, the pain is worse with standing, is relieve by nothing, he has been taking IBuprofen at home for pain, pt believes the lower back pain is responsible for his increase weakness.     Review of Systems  Constitutional: Positive for activity change and fatigue.  HENT: Negative.   Eyes: Negative.   Respiratory: Negative.   Cardiovascular: Negative.   Gastrointestinal: Positive for constipation.  Endocrine: Negative.   Genitourinary: Negative.   Musculoskeletal: Positive for back pain and gait problem.  Skin: Negative.   Allergic/Immunologic: Negative.   Neurological: Positive for weakness.  Hematological: Negative.   Psychiatric/Behavioral: Positive for agitation.       Reported by wife, worse at night.    Past Medical History  Diagnosis Date  . Diabetes mellitus without complication   . Hyperlipidemia   . Coronary artery disease     Moderate LAD, Diagonal, Circumflex disease by cath 2004  . Hypertension     History   Social History  . Marital Status: Married    Spouse Name: N/A    Number of Children: 4  . Years of Education: N/A   Occupational History  . Accountant    Social History Main Topics  . Smoking status: Never Smoker   . Smokeless tobacco: Not on file  . Alcohol Use: No  . Drug Use: No  . Sexual Activity: Not on  file   Other Topics Concern  . Not on file   Social History Narrative  . No narrative on file    Past Surgical History  Procedure Laterality Date  . None      Family History  Problem Relation Age of Onset  . CAD Neg Hx     No Known Allergies  Current Outpatient Prescriptions on File Prior to Visit  Medication Sig Dispense Refill  . aspirin 81 MG tablet Take 81 mg by mouth daily.      Marland Kitchen atorvastatin (LIPITOR) 80 MG tablet Take 80 mg by mouth daily.      . clopidogrel (PLAVIX) 75 MG tablet Take 75 mg by mouth daily with breakfast.      . ezetimibe (ZETIA) 10 MG tablet Take 10 mg by mouth daily.      Marland Kitchen HYDROcodone-acetaminophen (NORCO/VICODIN) 5-325 MG per tablet Take 2 tablets by mouth every 4 (four) hours as needed.  10 tablet  0  . insulin aspart (NOVOLOG) 100 UNIT/ML injection Inject 6 Units into the skin 3 (three) times daily before meals. NOVOLOG PEN      . insulin glargine (LANTUS) 100 UNIT/ML injection Inject 12 Units into the skin at bedtime. lantus pen  One injection at night 10 units      . metoprolol (LOPRESSOR) 50 MG tablet Take 1 tablet (50 mg total) by mouth  2 (two) times daily.  60 tablet  11  . valsartan (DIOVAN) 80 MG tablet Take 80 mg by mouth daily.       No current facility-administered medications on file prior to visit.    BP 172/96  Pulse 73  Ht 5\' 8"  (1.727 m)  Wt 201 lb (91.173 kg)  BMI 30.57 kg/m2chart    Objective:   Physical Exam  Constitutional: He is oriented to person, place, and time. He appears well-developed and well-nourished. He is active.  HENT:  Head: Normocephalic.  Right Ear: External ear normal.  Left Ear: External ear normal.  Nose: Nose normal.  Eyes: Conjunctivae, EOM and lids are normal. Pupils are equal, round, and reactive to light.  Cardiovascular: Normal rate, regular rhythm, normal heart sounds and normal pulses.   Pulmonary/Chest: Effort normal and breath sounds normal.  Abdominal: Soft. Normal appearance and bowel  sounds are normal.  Musculoskeletal:       Lumbar back: He exhibits tenderness and pain.  Neurological: He is alert and oriented to person, place, and time. Gait abnormal.  Skin: Skin is warm, dry and intact.  Psychiatric: He has a normal mood and affect. His speech is normal and behavior is normal. Thought content normal. He exhibits abnormal recent memory.          Assessment & Plan:  79 y.o. Black male present with chief complain of "falls".  - frequent falls and weakness: Schedule patient for MRI to examine spine  - Labs: BMP, LFT, CBC and TSH  - Urinalysis  - Back pain: start Tramadol Q8hrs prn for back pain  - Constipation: Recommend patient have colonoscopy due to family hx of colon cancer  - Follow up pending results of labs and MRI

## 2013-05-09 ENCOUNTER — Telehealth: Payer: Self-pay | Admitting: Family

## 2013-05-09 NOTE — Telephone Encounter (Signed)
error 

## 2013-05-10 ENCOUNTER — Telehealth: Payer: Self-pay

## 2013-05-10 NOTE — Telephone Encounter (Signed)
Relevant patient education mailed to patient.  

## 2013-05-15 ENCOUNTER — Ambulatory Visit (INDEPENDENT_AMBULATORY_CARE_PROVIDER_SITE_OTHER): Payer: Medicare HMO | Admitting: Endocrinology

## 2013-05-15 ENCOUNTER — Encounter: Payer: Self-pay | Admitting: Endocrinology

## 2013-05-15 ENCOUNTER — Encounter: Payer: Medicare HMO | Attending: Endocrinology | Admitting: Nutrition

## 2013-05-15 VITALS — BP 118/64 | HR 69 | Temp 98.4°F | Ht 68.0 in | Wt 196.0 lb

## 2013-05-15 DIAGNOSIS — Z713 Dietary counseling and surveillance: Secondary | ICD-10-CM | POA: Insufficient documentation

## 2013-05-15 DIAGNOSIS — E109 Type 1 diabetes mellitus without complications: Secondary | ICD-10-CM

## 2013-05-15 DIAGNOSIS — E119 Type 2 diabetes mellitus without complications: Secondary | ICD-10-CM | POA: Insufficient documentation

## 2013-05-15 NOTE — Patient Instructions (Signed)
good diet and exercise habits significanly improve the control of your diabetes.  please let me know if you wish to be referred to a dietician.  high blood sugar is very risky to your health.  you should see an eye doctor every year.  You are at higher than average risk for pneumonia and hepatitis-B.  You should be vaccinated against both.   controlling your blood pressure and cholesterol drastically reduces the damage diabetes does to your body.  this also applies to quitting smoking.  please discuss these with your doctor.  check your blood sugar 4 times a HQI:ONGEXBday:before the 3 meals, and at bedtime.  also check if you have symptoms of your blood sugar being too high or too low.  please keep a record of the readings and bring it to your next appointment here.  You can write it on any piece of paper.  please call us sooner if your blood sugar goes below 70, or if you have a lot of readings over 200. For now, please continue the same insulins.   Please come back for a follow-up appointment in 1 month.

## 2013-05-15 NOTE — Progress Notes (Signed)
Subjective:    Patient ID: William Aguirre, male    DOB: Sep 04, 1934, 79 y.o.   MRN: [id number]  HPI pt states DM was dx'ed in 1986; he has mild if any neuropathy of the lower extremities; he is unaware of any associated chronic complications.  he has been on insulin since soon after dx.  pt says his diet and exercise are good.  he denies hypoglycemia.  he brings a record of his cbg's which i have reviewed today.  It varies from 90-400.  There is no trend throughout the day.  Past Medical History  Diagnosis Date  . Diabetes mellitus without complication   . Hyperlipidemia   . Coronary artery disease     Moderate LAD, Diagonal, Circumflex disease by cath 2004  . Hypertension     Past Surgical History  Procedure Laterality Date  . None      History   Social History  . Marital Status: Married    Spouse Name: N/A    Number of Children: 4  . Years of Education: N/A   Occupational History  . Accountant    Social History Main Topics  . Smoking status: Never Smoker   . Smokeless tobacco: Not on file  . Alcohol Use: No  . Drug Use: No  . Sexual Activity: Not on file   Other Topics Concern  . Not on file   Social History Narrative  . No narrative on file    Current Outpatient Prescriptions on File Prior to Visit  Medication Sig Dispense Refill  . aspirin 81 MG tablet Take 81 mg by mouth daily.      Marland Kitchen atorvastatin (LIPITOR) 80 MG tablet Take 80 mg by mouth daily.      . clopidogrel (PLAVIX) 75 MG tablet Take 75 mg by mouth daily with breakfast.      . ezetimibe (ZETIA) 10 MG tablet Take 10 mg by mouth daily.      Marland Kitchen HYDROcodone-acetaminophen (NORCO/VICODIN) 5-325 MG per tablet Take 2 tablets by mouth every 4 (four) hours as needed.  10 tablet  0  . insulin aspart (NOVOLOG) 100 UNIT/ML injection Inject 6 Units into the skin 3 (three) times daily before meals. NOVOLOG PEN      . insulin glargine (LANTUS) 100 UNIT/ML injection Inject 12 Units into the skin at bedtime. lantus  pen  One injection at night 10 units      . metoprolol (LOPRESSOR) 50 MG tablet Take 1 tablet (50 mg total) by mouth 2 (two) times daily.  60 tablet  11  . RELION MINI PEN NEEDLES 31G X 6 MM MISC       . traMADol (ULTRAM) 50 MG tablet Take 1 tablet (50 mg total) by mouth every 8 (eight) hours as needed.  30 tablet  0  . valsartan (DIOVAN) 80 MG tablet Take 80 mg by mouth daily.       No current facility-administered medications on file prior to visit.    No Known Allergies  Family History  Problem Relation Age of Onset  . CAD Neg Hx   DM: none  BP 118/64  Pulse 69  Temp(Src) 98.4 F (36.9 C) (Oral)  Ht 5\' 8"  (1.727 m)  Wt 196 lb (88.905 kg)  BMI 29.81 kg/m2  SpO2 96%  Review of Systems denies blurry vision, headache, chest pain, sob, n/v, urinary frequency, cramps, excessive diaphoresis, memory loss (but wife says pt has memory loss), depression, cold intolerance, rhinorrhea, and easy bruising.  He  has lost weight.    Objective:   Physical Exam VS: see vs page GEN: no distress.  Obese. HEAD: head: no deformity eyes: no periorbital swelling, no proptosis external nose and ears are normal mouth: no lesion seen NECK: supple, thyroid is not enlarged CHEST WALL: no deformity LUNGS: clear to auscultation BREASTS:  No gynecomastia CV: reg rate and rhythm, no murmur ABD: abdomen is soft, nontender.  no hepatosplenomegaly.  not distended.  no hernia MUSCULOSKELETAL: muscle bulk and strength are grossly normal.  no obvious joint swelling.  gait is normal and steady PULSES: no carotid bruit NEURO:  cn 2-12 grossly intact.   readily moves all 4's.  SKIN:  Normal texture and temperature.  No rash or suspicious lesion is visible.   NODES:  None palpable at the neck PSYCH: alert, well-oriented.  Does not appear anxious nor depressed.  Lab Results  Component Value Date   HGBA1C 13.8* 05/08/2013      Assessment & Plan:  DM: type 1 is possible, due to extreme cbg variability and  neg Fhx.  poor control.  He needs a simpler regimen, but he declines. CAD: in this context, he should avoid hypoglycemia. Weight loss: prob due to chronic severe hyperglycemia.

## 2013-05-16 NOTE — Progress Notes (Signed)
Patient is here with his wife.  Diet is mostly balanced, having 45-75 grams of carbs at each meal, but high in fat.   He is taking Novolog, 6u acbfast, and acLunch, but is taking his evening injection at bedtime, when he takes his Lantus insulin. (9PM--3hr. PcS).  Discussed the importance of taking his Novolog before meals, and he assured me that he would do this.  His wife works afternoons, and so, is not home before supper to observe this.  He reports understanding why he needs to take his Novolog before supper, and says he will do this.  Written instructions were given for this as well.    Suggestions were given for lower his fat content in some meals.  They had no final questions.

## 2013-05-17 ENCOUNTER — Other Ambulatory Visit: Payer: Self-pay | Admitting: Family

## 2013-05-17 DIAGNOSIS — H547 Unspecified visual loss: Secondary | ICD-10-CM

## 2013-05-18 ENCOUNTER — Telehealth: Payer: Self-pay | Admitting: Family

## 2013-05-18 DIAGNOSIS — Z1211 Encounter for screening for malignant neoplasm of colon: Secondary | ICD-10-CM

## 2013-05-18 NOTE — Telephone Encounter (Signed)
Pt wife would like NP to call her back concerning mri sch for 05-22-13. Pt wife has questions.

## 2013-05-18 NOTE — Telephone Encounter (Signed)
Left message on machine To return call 

## 2013-05-18 NOTE — Telephone Encounter (Signed)
Not sure what her questions are. Please call Mr. Dorise BullionGladden or his wife if she is on his HIPPA form. MRI scheduled due to leg weakness and h/o falls.

## 2013-05-19 NOTE — Telephone Encounter (Signed)
Wife states that pt needs a colonoscopy

## 2013-05-19 NOTE — Telephone Encounter (Signed)
Requesting GI referral. Please advise  MRI scheduled for Monday 05/22/13

## 2013-05-19 NOTE — Telephone Encounter (Signed)
What are the concerns? Need a diagnosis.

## 2013-05-22 ENCOUNTER — Other Ambulatory Visit: Payer: Medicare HMO

## 2013-05-22 ENCOUNTER — Ambulatory Visit
Admission: RE | Admit: 2013-05-22 | Discharge: 2013-05-22 | Disposition: A | Discharge status: Home / Self Care | Payer: Commercial Managed Care - HMO | Source: Ambulatory Visit | Attending: Family | Admitting: Family

## 2013-05-22 ENCOUNTER — Other Ambulatory Visit: Payer: Self-pay | Admitting: Family

## 2013-05-22 DIAGNOSIS — M545 Low back pain, unspecified: Secondary | ICD-10-CM

## 2013-05-22 DIAGNOSIS — M5416 Radiculopathy, lumbar region: Secondary | ICD-10-CM

## 2013-05-22 DIAGNOSIS — IMO0002 Reserved for concepts with insufficient information to code with codable children: Secondary | ICD-10-CM

## 2013-05-22 NOTE — Patient Instructions (Signed)
Take Novolog before supper meal, not at bedtime. Take lantus insulin at bedtime. Choose lower fat options when eating out, and at home like lite mayo, lite butter, baked meats in stead of frying, and steamed vegetables in stead of those with oil and butter on them.

## 2013-05-25 ENCOUNTER — Encounter: Payer: Self-pay | Admitting: Physician Assistant

## 2013-05-28 ENCOUNTER — Emergency Department (HOSPITAL_COMMUNITY): Payer: Medicare HMO

## 2013-05-28 ENCOUNTER — Encounter (HOSPITAL_COMMUNITY): Payer: Self-pay | Admitting: Emergency Medicine

## 2013-05-28 ENCOUNTER — Emergency Department (HOSPITAL_COMMUNITY)
Admission: EM | Admit: 2013-05-28 | Discharge: 2013-05-29 | Disposition: A | Discharge status: Home / Self Care | Payer: Medicare HMO | Attending: Emergency Medicine | Admitting: Emergency Medicine

## 2013-05-28 DIAGNOSIS — Z79899 Other long term (current) drug therapy: Secondary | ICD-10-CM | POA: Insufficient documentation

## 2013-05-28 DIAGNOSIS — E785 Hyperlipidemia, unspecified: Secondary | ICD-10-CM | POA: Insufficient documentation

## 2013-05-28 DIAGNOSIS — F0390 Unspecified dementia without behavioral disturbance: Secondary | ICD-10-CM | POA: Insufficient documentation

## 2013-05-28 DIAGNOSIS — Z794 Long term (current) use of insulin: Secondary | ICD-10-CM | POA: Insufficient documentation

## 2013-05-28 DIAGNOSIS — Z7982 Long term (current) use of aspirin: Secondary | ICD-10-CM | POA: Insufficient documentation

## 2013-05-28 DIAGNOSIS — R238 Other skin changes: Secondary | ICD-10-CM | POA: Insufficient documentation

## 2013-05-28 DIAGNOSIS — I10 Essential (primary) hypertension: Secondary | ICD-10-CM | POA: Insufficient documentation

## 2013-05-28 DIAGNOSIS — I2510 Atherosclerotic heart disease of native coronary artery without angina pectoris: Secondary | ICD-10-CM | POA: Insufficient documentation

## 2013-05-28 DIAGNOSIS — Z7902 Long term (current) use of antithrombotics/antiplatelets: Secondary | ICD-10-CM | POA: Insufficient documentation

## 2013-05-28 DIAGNOSIS — E119 Type 2 diabetes mellitus without complications: Secondary | ICD-10-CM | POA: Insufficient documentation

## 2013-05-28 LAB — COMPREHENSIVE METABOLIC PANEL
ALK PHOS: 144 U/L — AB (ref 39–117)
ALT: 17 U/L (ref 0–53)
AST: 19 U/L (ref 0–37)
Albumin: 3.8 g/dL (ref 3.5–5.2)
BUN: 12 mg/dL (ref 6–23)
CO2: 22 meq/L (ref 19–32)
Calcium: 10 mg/dL (ref 8.4–10.5)
Chloride: 98 mEq/L (ref 96–112)
Creatinine, Ser: 1.1 mg/dL (ref 0.50–1.35)
GFR calc Af Amer: 73 mL/min — ABNORMAL LOW (ref >90)
GFR calc non Af Amer: 63 mL/min — ABNORMAL LOW (ref >90)
Glucose, Bld: 164 mg/dL — ABNORMAL HIGH (ref 70–99)
POTASSIUM: 4.2 meq/L (ref 3.7–5.3)
SODIUM: 136 meq/L — AB (ref 137–147)
TOTAL PROTEIN: 8 g/dL (ref 6.0–8.3)
Total Bilirubin: 0.5 mg/dL (ref 0.3–1.2)

## 2013-05-28 LAB — CBC
HCT: 42.8 % (ref 39.0–52.0)
Hemoglobin: 14.1 g/dL (ref 13.0–17.0)
MCH: 26.6 pg (ref 26.0–34.0)
MCHC: 32.9 g/dL (ref 30.0–36.0)
MCV: 80.6 fL (ref 78.0–100.0)
Platelets: 148 10*3/uL — ABNORMAL LOW (ref 150–400)
RBC: 5.31 MIL/uL (ref 4.22–5.81)
RDW: 13.8 % (ref 11.5–15.5)
WBC: 9.6 10*3/uL (ref 4.0–10.5)

## 2013-05-28 LAB — GLUCOSE, CAPILLARY
Glucose-Capillary: 147 mg/dL — ABNORMAL HIGH (ref 70–99)
Glucose-Capillary: 145 mg/dL — ABNORMAL HIGH (ref 70–99)

## 2013-05-28 LAB — ETHANOL: Alcohol, Ethyl (B): <11 mg/dL (ref 0–11)

## 2013-05-28 LAB — SALICYLATE LEVEL: Salicylate Lvl: <2 mg/dL — ABNORMAL LOW (ref 2.8–20.0)

## 2013-05-28 LAB — ACETAMINOPHEN LEVEL

## 2013-05-28 MED ORDER — ONDANSETRON HCL 4 MG PO TABS: 4.0000 mg | ORAL_TABLET | Freq: Three times a day (TID) | ORAL | Status: DC | PRN

## 2013-05-28 MED ORDER — ATORVASTATIN CALCIUM 80 MG PO TABS
80.0000 mg | ORAL_TABLET | Freq: Every day | ORAL | Status: DC
  Administered 2013-05-28: 80 mg via ORAL
  Filled 2013-05-28 (×2): qty 1

## 2013-05-28 MED ORDER — EZETIMIBE 10 MG PO TABS
10.0000 mg | ORAL_TABLET | Freq: Every day | ORAL | Status: DC
  Administered 2013-05-28: 10 mg via ORAL
  Filled 2013-05-28 (×2): qty 1

## 2013-05-28 MED ORDER — METOPROLOL TARTRATE 25 MG PO TABS
50.0000 mg | ORAL_TABLET | Freq: Two times a day (BID) | ORAL | Status: DC
Start: 2013-05-28 — End: 2013-05-29
  Administered 2013-05-28: 50 mg via ORAL
  Filled 2013-05-28: qty 2

## 2013-05-28 MED ORDER — IRBESARTAN 150 MG PO TABS
150.0000 mg | ORAL_TABLET | Freq: Every day | ORAL | Status: DC
Start: 2013-05-29 — End: 2013-05-29
  Filled 2013-05-28: qty 1

## 2013-05-28 MED ORDER — ZOLPIDEM TARTRATE 5 MG PO TABS: 5.0000 mg | ORAL_TABLET | Freq: Every evening | ORAL | Status: DC | PRN

## 2013-05-28 MED ORDER — ALUM & MAG HYDROXIDE-SIMETH 200-200-20 MG/5ML PO SUSP: 30.0000 mL | ORAL | Status: DC | PRN

## 2013-05-28 MED ORDER — ASPIRIN EC 81 MG PO TBEC
81.0000 mg | DELAYED_RELEASE_TABLET | Freq: Every day | ORAL | Status: DC
  Filled 2013-05-28: qty 1

## 2013-05-28 MED ORDER — IBUPROFEN 200 MG PO TABS: 600.0000 mg | ORAL_TABLET | Freq: Three times a day (TID) | ORAL | Status: DC | PRN

## 2013-05-28 MED ORDER — INSULIN GLARGINE 100 UNIT/ML ~~LOC~~ SOLN
12.0000 [IU] | Freq: Every day | SUBCUTANEOUS | Status: DC
  Administered 2013-05-28: 12 [IU] via SUBCUTANEOUS
  Filled 2013-05-28: qty 0.12

## 2013-05-28 MED ORDER — CLOPIDOGREL BISULFATE 75 MG PO TABS
75.0000 mg | ORAL_TABLET | Freq: Every day | ORAL | Status: DC
  Filled 2013-05-28 (×2): qty 1

## 2013-05-28 MED ORDER — LORAZEPAM 1 MG PO TABS: 1.0000 mg | ORAL_TABLET | Freq: Three times a day (TID) | ORAL | Status: DC | PRN

## 2013-05-28 MED ORDER — NICOTINE 21 MG/24HR TD PT24: 21.0000 mg | MEDICATED_PATCH | Freq: Every day | TRANSDERMAL | Status: DC

## 2013-05-28 MED ORDER — INSULIN ASPART 100 UNIT/ML ~~LOC~~ SOLN: 6.0000 [IU] | Freq: Three times a day (TID) | SUBCUTANEOUS | Status: DC
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
## 2013-05-28 NOTE — ED Notes (Signed)
Patient alert, watching TV, NAD at this time. Will con't to monitor.
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
## 2013-05-28 NOTE — ED Notes (Signed)
Patient Daughter would like to be called once patient seen by  Psych MD. William Aguirre [phone]

## 2013-05-28 NOTE — ED Notes (Signed)
Drenda FreezeFran, NP [facility] at bedside.

## 2013-05-28 NOTE — ED Notes (Signed)
Patient unable to void 

## 2013-05-28 NOTE — ED Notes (Signed)
TTS at bedside. Daughter also in room.

## 2013-05-28 NOTE — ED Notes (Signed)
Pt unable to provide urine sample at this time 

## 2013-05-29 LAB — RAPID URINE DRUG SCREEN, HOSP PERFORMED
Amphetamines: NOT DETECTED
BARBITURATES: NOT DETECTED
Benzodiazepines: NOT DETECTED
COCAINE: NOT DETECTED
Opiates: NOT DETECTED
TETRAHYDROCANNABINOL: NOT DETECTED

## 2013-05-29 LAB — URINALYSIS, ROUTINE W REFLEX MICROSCOPIC
BILIRUBIN URINE: NEGATIVE
GLUCOSE, UA: NEGATIVE mg/dL
Hgb urine dipstick: NEGATIVE
Ketones, ur: 15 mg/dL — AB
Leukocytes, UA: NEGATIVE
Nitrite: NEGATIVE
PH: 5.5 (ref 5.0–8.0)
Protein, ur: 30 mg/dL — AB
Specific Gravity, Urine: 1.019 (ref 1.005–1.030)
Urobilinogen, UA: 0.2 mg/dL (ref 0.0–1.0)

## 2013-05-29 LAB — URINE MICROSCOPIC-ADD ON

## 2013-05-29 NOTE — ED Notes (Signed)
Patient remains unable to urinate. Patient given coffee and additional water. Patient was also shown and explained a catheter procedure. Patient reminded it is his decision to continue waiting or to have In/Out cath done, and that once urine is obtained he will be able to be discharged in @ 1 hour.

## 2013-05-29 NOTE — Discharge Instructions (Signed)
1. Medications: usual home medications 2. Treatment: rest, drink plenty of fluids,  3. Follow Up: Please followup with your primary doctor for discussion of your diagnoses and further evaluation after today's visit;        Dementia Dementia is a general term for problems with brain function. A person with dementia has memory loss and a hard time with at least one other brain function such as thinking, speaking, or problem solving. Dementia can affect social functioning, how you do your job, your mood, or your personality. The changes may be hidden for a long time. The earliest forms of this disease are usually not detected by family or friends. Dementia can be:  Irreversible.  Potentially reversible.  Partially reversible.  Progressive. This means it can get worse over time. CAUSES  Irreversible dementia causes may include:  Degeneration of brain cells (Alzheimer's disease or lewy body dementia).  Multiple small strokes (vascular dementia).  Infection (chronic meningitis or Creutzfelt-Jakob disease).  Frontotemporal dementia. This affects younger people, age 77 to 5370, compared to those who have Alzheimer's disease.  Dementia associated with other disorders like Parkinson's disease, Huntington's disease, or HIV-associated dementia. Potentially or partially reversible dementia causes may include:  Medicines.  Metabolic causes such as excessive alcohol intake, vitamin B12 deficiency, or thyroid disease.  Masses or pressure in the brain such as a tumor, blood clot, or hydrocephalus. SYMPTOMS  Symptoms are often hard to detect. Family members or coworkers may not notice them early in the disease process. Different people with dementia may have different symptoms. Symptoms can include:  A hard time with memory, especially recent memory. Long-term memory may not be impaired.  Asking the same question multiple times or forgetting something someone just said.  A hard time  speaking your thoughts or finding certain words.  A hard time solving problems or performing familiar tasks (such as how to use a telephone).  Sudden changes in mood.  Changes in personality, especially increasing moodiness or mistrust.  Depression.  A hard time understanding complex ideas that were never a problem in the past. DIAGNOSIS  There are no specific tests for dementia.   Your caregiver may recommend a thorough evaluation. This is because some forms of dementia can be reversible. The evaluation will likely include a physical exam and getting a detailed history from you and a family member. The history often gives the best clues and suggestions for a diagnosis.  Memory testing may be done. A detailed brain function evaluation called neuropsychologic testing may be helpful.  Lab tests and brain imaging (such as a CT scan or MRI scan) are sometimes important.  Sometimes observation and re-evaluation over time is very helpful. TREATMENT  Treatment depends on the cause.   If the problem is a vitamin deficiency, it may be helped or cured with supplements.  For dementias such as Alzheimer's disease, medicines are available to stabilize or slow the course of the disease. There are no cures for this type of dementia.  Your caregiver can help direct you to groups, organizations, and other caregivers to help with decisions in the care of you or your loved one. HOME CARE INSTRUCTIONS The care of individuals with dementia is varied and dependent upon the progression of the dementia. The following suggestions are intended for the person living with, or caring for, the person with dementia.  Create a safe environment.  Remove the locks on bathroom doors to prevent the person from accidentally locking himself or herself in.  Use childproof  latches on kitchen cabinets and any place where cleaning supplies, chemicals, or alcohol are kept.  Use childproof covers in unused electrical  outlets.  Install childproof devices to keep doors and windows secured.  Remove stove knobs or install safety knobs and an automatic shut-off on the stove.  Lower the temperature on water heaters.  Label medicines and keep them locked up.  Secure knives, lighters, matches, power tools, and guns, and keep these items out of reach.  Keep the house free from clutter. Remove rugs or anything that might contribute to a fall.  Remove objects that might break and hurt the person.  Make sure lighting is good, both inside and outside.  Install grab rails as needed.  Use a monitoring device to alert you to falls or other needs for help.  Reduce confusion.  Keep familiar objects and people around.  Use night lights or dim lights at night.  Label items or areas.  Use reminders, notes, or directions for daily activities or tasks.  Keep a simple, consistent routine for waking, meals, bathing, dressing, and bedtime.  Create a calm, quiet environment.  Place large clocks and calendars prominently.  Display emergency numbers and home address near all telephones.  Use cues to establish different times of the day. An example is to open curtains to let the natural light in during the day.   Use effective communication.  Choose simple words and short sentences.  Use a gentle, calm tone of voice.  Be careful not to interrupt.  If the person is struggling to find a word or communicate a thought, try to provide the word or thought.  Ask one question at a time. Allow the person ample time to answer questions. Repeat the question again if the person does not respond.  Reduce nighttime restlessness.  Provide a comfortable bed.  Have a consistent nighttime routine.  Ensure a regular walking or physical activity schedule. Involve the person in daily activities as much as possible.  Limit napping during the day.  Limit caffeine.  Attend social events that stimulate rather than  overwhelm the senses.  Encourage good nutrition and hydration.  Reduce distractions during meal times and snacks.  Avoid foods that are too hot or too cold.  Monitor chewing and swallowing ability.  Continue with routine vision, hearing, dental, and medical screenings.  Only give over-the-counter or prescription medicines as directed by the caregiver.  Monitor driving abilities. Do not allow the person to drive when safe driving is no longer possible.  Register with an identification program which could provide location assistance in the event of a missing person situation. SEEK MEDICAL CARE IF:   New behavioral problems start such as moodiness, aggressiveness, or seeing things that are not there (hallucinations).  Any new problem with brain function happens. This includes problems with balance, speech, or falling a lot.  Problems with swallowing develop.  Any symptoms of other illness happen. Small changes or worsening in any aspect of brain function can be a sign that the illness is getting worse. It can also be a sign of another medical illness such as infection. Seeing a caregiver right away is important. SEEK IMMEDIATE MEDICAL CARE IF:   A fever develops.  New or worsened confusion develops.  New or worsened sleepiness develops.  Staying awake becomes hard to do. Document Released: 09/23/2000 Document Revised: 06/22/2011 Document Reviewed: 08/25/2010 [facility] Patient Information 2014 [employer] [place].

## 2013-05-29 NOTE — ED Notes (Signed)
Dahlia ClientHannah, GeorgiaPA notified patient family is here and awaiting d/c paperwork. States she will be in to see patient and family in a few minutes.

## 2013-05-29 NOTE — ED Notes (Signed)
PA at bedside.

## 2013-05-29 NOTE — ED Notes (Signed)
Bladder scan completed. Patient found to have 354 mL urine.

## 2013-05-29 NOTE — ED Notes (Signed)
Patient anda family aware patient needs to provide a urine sample. Patient given ice water and urinal. Requested to notify nursing staff as soon as urine sample is made ready.

## 2013-05-31 ENCOUNTER — Encounter: Payer: Self-pay | Admitting: *Deleted

## 2013-05-31 ENCOUNTER — Telehealth: Payer: Self-pay | Admitting: Family

## 2013-05-31 NOTE — ED Provider Notes (Signed)
Medical screening examination/treatment/procedure(s) were conducted as a shared visit with non-physician practitioner(s) and myself.  I personally evaluated the patient during the encounter.  EKG Interpretation   None         Candyce ChurnJohn David Arianis Bowditch III, MD 05/31/13 [phone]
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
## 2013-06-01 ENCOUNTER — Encounter: Payer: Self-pay | Admitting: Gastroenterology

## 2013-06-01 ENCOUNTER — Encounter: Payer: Self-pay | Admitting: Internal Medicine

## 2013-06-01 ENCOUNTER — Telehealth: Payer: Self-pay | Admitting: *Deleted

## 2013-06-01 ENCOUNTER — Ambulatory Visit (INDEPENDENT_AMBULATORY_CARE_PROVIDER_SITE_OTHER): Payer: Medicare HMO | Admitting: Internal Medicine

## 2013-06-01 ENCOUNTER — Ambulatory Visit (INDEPENDENT_AMBULATORY_CARE_PROVIDER_SITE_OTHER): Payer: Medicare HMO | Admitting: Gastroenterology

## 2013-06-01 VITALS — BP 142/80 | HR 65 | Temp 97.6°F | Resp 20 | Ht 68.0 in | Wt 192.0 lb

## 2013-06-01 VITALS — BP 128/64 | HR 82 | Ht 68.0 in | Wt 195.0 lb

## 2013-06-01 DIAGNOSIS — R413 Other amnesia: Secondary | ICD-10-CM

## 2013-06-01 DIAGNOSIS — G3184 Mild cognitive impairment, so stated: Secondary | ICD-10-CM

## 2013-06-01 DIAGNOSIS — Z1211 Encounter for screening for malignant neoplasm of colon: Secondary | ICD-10-CM

## 2013-06-01 DIAGNOSIS — E119 Type 2 diabetes mellitus without complications: Secondary | ICD-10-CM

## 2013-06-01 DIAGNOSIS — E785 Hyperlipidemia, unspecified: Secondary | ICD-10-CM

## 2013-06-01 DIAGNOSIS — IMO0001 Reserved for inherently not codable concepts without codable children: Secondary | ICD-10-CM

## 2013-06-01 DIAGNOSIS — E1165 Type 2 diabetes mellitus with hyperglycemia: Secondary | ICD-10-CM

## 2013-06-01 DIAGNOSIS — I10 Essential (primary) hypertension: Secondary | ICD-10-CM

## 2013-06-01 MED ORDER — PEG-KCL-NACL-NASULF-NA ASC-C 100 G PO SOLR: 1.0000 | Freq: Once | ORAL | Status: DC
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
## 2013-06-01 NOTE — Progress Notes (Addendum)
06/01/2013 William EvertsHenry S Aguirre [id number] December 14, 1934   HISTORY OF PRESENT ILLNESS:  This is a 79 year old male who is new to our practice and is here today to discuss colonoscopy.  He has never undergone one in the past.  He has PMH of CAD (managed medically), HTN, HLD, IDDM, and some mild dementia.  He is on Plavix.  He is here today with his grand-daughter.  He denies any GI complaints.   Past Medical History  Diagnosis Date  . Diabetes mellitus without complication   . Hyperlipidemia   . Coronary artery disease     Moderate LAD, Diagonal, Circumflex disease by cath 2004  . Hypertension   . Dementia    Past Surgical History  Procedure Laterality Date  . None      reports that he has never smoked. He has never used smokeless tobacco. He reports that he does not drink alcohol or use illicit drugs. family history is negative for CAD. No Known Allergies    Outpatient Encounter Prescriptions as of 06/01/2013  Medication Sig  . aspirin EC 81 MG tablet Take 81 mg by mouth daily.  Marland Kitchen. atorvastatin (LIPITOR) 80 MG tablet Take 80 mg by mouth daily.  . clopidogrel (PLAVIX) 75 MG tablet Take 75 mg by mouth daily with breakfast.  . ezetimibe (ZETIA) 10 MG tablet Take 10 mg by mouth daily.  . insulin aspart (NOVOLOG) 100 UNIT/ML injection Inject 6 Units into the skin 3 (three) times daily before meals. NOVOLOG PEN  . insulin glargine (LANTUS) 100 UNIT/ML injection Inject 12 Units into the skin at bedtime. lantus pen  One injection at night 10 units  . metoprolol (LOPRESSOR) 50 MG tablet Take 1 tablet (50 mg total) by mouth 2 (two) times daily.  Marland Kitchen. RELION MINI PEN NEEDLES 31G X 6 MM MISC   . traMADol (ULTRAM) 50 MG tablet Take 50 mg by mouth every 8 (eight) hours as needed for severe pain.  . valsartan (DIOVAN) 80 MG tablet Take 80 mg by mouth daily.     REVIEW OF SYSTEMS  : All other systems reviewed and negative except where noted in the History of Present Illness.   PHYSICAL EXAM: BP 128/64   Pulse 82  Ht 5\' 8"  (1.727 m)  Wt 195 lb (88.451 kg)  BMI 29.66 kg/m2 General: Well developed black male in no acute distress Head: Normocephalic and atraumatic Eyes:  Sclerae anicteric, conjunctiva pink. Ears: Normal auditory acuity.  Lungs: Clear throughout to auscultation Heart: Regular rate and rhythm. Abdomen: Soft, non-distended.  Normal bowel sounds.  Non-tender. Rectal: Deferred.  Will be done at the time of colonoscopy. Musculoskeletal: Symmetrical with no gross deformities  Skin: No lesions on visible extremities Extremities: No edema  Neurological: Alert oriented x 4, grossly non-focal. Psychological:  Alert and cooperative. Normal mood and affect  ASSESSMENT AND PLAN: -Screening colonoscopy:  Will schedule.  The risks, benefits, and alternatives were discussed with the patient and he consents to proceed.  The risks benefits and alternatives to a temporary hold of anti-coagulants/anti-platelets for the procedure were discussed with the patient he consents to proceed. Obtain clearance from Dr. Clifton JamesMcAlhany to hold Plavix.  Addendum: Reviewed and agree with screening colonoscopy assuming permission granted to hold clopidogrel by Dr. Beaulah DinningMcAlhany Jay M Pyrtle, MD

## 2013-06-01 NOTE — Telephone Encounter (Signed)
Tried to call pt No Answer 

## 2013-06-01 NOTE — Patient Instructions (Signed)
You have been scheduled for a colonoscopy with propofol. Please follow written instructions given to you at your visit today.  Please pick up your prep kit at the pharmacy within the next 1-3 days. If you use inhalers (even only as needed), please bring them with you on the day of your procedure. Your physician has requested that you go to www.startemmi.com and enter the access code given to you at your visit today. This web site gives a general overview about your procedure. However, you should still follow specific instructions given to you by our office regarding your preparation for the procedure.  You will be contacted by our office prior to your procedure for directions on holding your Plavix.  If you do not hear from our office 1 week prior to your scheduled procedure, please call [phone] to discuss.   

## 2013-06-01 NOTE — Telephone Encounter (Signed)
(  Pt William Aguirre) He can hold Plavix 7 days prior to the procedure.   [name] [name], MD

## 2013-06-01 NOTE — Progress Notes (Signed)
Pre-visit discussion using our clinic review tool. No additional management support is needed unless otherwise documented below in the visit note.  

## 2013-06-01 NOTE — Telephone Encounter (Signed)
06/01/2013   RE: William EvertsHenry S Blackie DOB: 10-25-1934 MRN: [id number]   Dear  Dr Clifton JamesMcAlhany  We have scheduled the above patient for an endoscopic procedure. Our records show that he is on anticoagulation therapy.   Please advise as to how long the patient may come off his therapy of Plavix prior to the procedure, which is scheduled for 07/04/2013.  Please fax back/ or route the completed form to Tricia [place] at [phone]  Sincerely,    Merri Rayobin Darla Mcdonald

## 2013-06-02 ENCOUNTER — Telehealth: Payer: Self-pay | Admitting: Family
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
## 2013-06-05 NOTE — Telephone Encounter (Signed)
Left message for pts wife to call back. 

## 2013-06-07 NOTE — Telephone Encounter (Signed)
Tried to contact pt no answer

## 2013-06-09 ENCOUNTER — Ambulatory Visit (INDEPENDENT_AMBULATORY_CARE_PROVIDER_SITE_OTHER): Payer: Medicare HMO | Admitting: Family

## 2013-06-09 ENCOUNTER — Telehealth: Payer: Self-pay | Admitting: Family

## 2013-06-09 ENCOUNTER — Ambulatory Visit: Payer: Medicare HMO | Admitting: Family

## 2013-06-09 ENCOUNTER — Encounter: Payer: Self-pay | Admitting: Family

## 2013-06-09 VITALS — BP 132/82 | HR 75 | Ht 68.0 in | Wt 193.0 lb

## 2013-06-09 DIAGNOSIS — E1165 Type 2 diabetes mellitus with hyperglycemia: Principal | ICD-10-CM

## 2013-06-09 DIAGNOSIS — IMO0001 Reserved for inherently not codable concepts without codable children: Secondary | ICD-10-CM

## 2013-06-09 DIAGNOSIS — IMO0002 Reserved for concepts with insufficient information to code with codable children: Secondary | ICD-10-CM

## 2013-06-09 DIAGNOSIS — E7800 Pure hypercholesterolemia, unspecified: Secondary | ICD-10-CM

## 2013-06-09 DIAGNOSIS — I10 Essential (primary) hypertension: Secondary | ICD-10-CM

## 2013-06-09 MED ORDER — VALSARTAN 80 MG PO TABS: 80.0000 mg | ORAL_TABLET | Freq: Every day | ORAL | Status: DC

## 2013-06-09 NOTE — Telephone Encounter (Signed)
Pt's daughter would like you to call her and let her know why William Aguirre would have called you about pt. Ms William Aguirre states she is not supposed to have any contact about pt.

## 2013-06-09 NOTE — Telephone Encounter (Signed)
Spoke with William Aguirre's daughter to advise that a DPR needs to be completed and on file with the names of those authorized to have access to William Aguirre's medical records. William Aguirre Carinadvised William Aguirre that neither her or William Aguirre's wife are currently authorized. William HerterShannon states that wife violated [address]B that is in place by calling the office earlier this week. She was told by William Aguirre who was in office today and advised of the call at that time. Daughter states that she will be sure form is completed soon. I also advised her that he will need a DPR for each individual office.

## 2013-06-09 NOTE — Patient Instructions (Signed)

## 2013-06-09 NOTE — Progress Notes (Signed)
Pre visit review using our clinic review tool, if applicable. No additional management support is needed unless otherwise documented below in the visit note. 

## 2013-06-12 ENCOUNTER — Encounter: Payer: Self-pay | Admitting: Endocrinology

## 2013-06-12 ENCOUNTER — Telehealth: Payer: Self-pay | Admitting: Family

## 2013-06-12 ENCOUNTER — Ambulatory Visit (INDEPENDENT_AMBULATORY_CARE_PROVIDER_SITE_OTHER): Payer: Medicare HMO | Admitting: Endocrinology

## 2013-06-12 VITALS — BP 116/70 | HR 83 | Temp 97.9°F | Wt 190.0 lb

## 2013-06-12 DIAGNOSIS — E119 Type 2 diabetes mellitus without complications: Secondary | ICD-10-CM
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
## 2013-06-12 NOTE — Telephone Encounter (Signed)
Relevant patient education assigned to patient using Emmi. ° °

## 2013-06-12 NOTE — Progress Notes (Signed)
Subjective:    Patient ID: William Aguirre, male    DOB: 1934/09/12, 79 y.o.   MRN: [id number]  HPI pt returns for f/u of insulin-requiring DM (dx'ed 1986; he has mild if any neuropathy of the lower extremities, but he has associated CAD; he has been on insulin since soon after dx).  no cbg record, but states cbg's are well-controlled.  He says he seldom misses insulin doses.  He says his next a1c will be much better.   Past Medical History  Diagnosis Date  . Diabetes mellitus without complication   . Hyperlipidemia   . Coronary artery disease     Moderate LAD, Diagonal, Circumflex disease by cath 2004  . Hypertension   . Dementia     Past Surgical History  Procedure Laterality Date  . None      History   Social History  . Marital Status: Married    Spouse Name: N/A    Number of Children: 4  . Years of Education: N/A   Occupational History  . Accountant    Social History Main Topics  . Smoking status: Never Smoker   . Smokeless tobacco: Never Used  . Alcohol Use: No  . Drug Use: No  . Sexual Activity: Not on file   Other Topics Concern  . Not on file   Social History Narrative  . No narrative on file    Current Outpatient Prescriptions on File Prior to Visit  Medication Sig Dispense Refill  . aspirin EC 81 MG tablet Take 81 mg by mouth daily.      Marland Kitchen atorvastatin (LIPITOR) 80 MG tablet Take 80 mg by mouth daily.      . clopidogrel (PLAVIX) 75 MG tablet Take 75 mg by mouth daily with breakfast.      . ezetimibe (ZETIA) 10 MG tablet Take 10 mg by mouth daily.      . insulin aspart (NOVOLOG) 100 UNIT/ML injection Inject 6 Units into the skin 3 (three) times daily before meals. NOVOLOG PEN      . insulin glargine (LANTUS) 100 UNIT/ML injection Inject 12 Units into the skin at bedtime. lantus pen  One injection at night 10 units      . metoprolol (LOPRESSOR) 50 MG tablet Take 1 tablet (50 mg total) by mouth 2 (two) times daily.  60 tablet  11  . peg 3350 powder  (MOVIPREP) 100 G SOLR Take 1 kit (200 g total) by mouth once.  1 kit  0  . RELION MINI PEN NEEDLES 31G X 6 MM MISC       . traMADol (ULTRAM) 50 MG tablet Take 50 mg by mouth every 8 (eight) hours as needed for severe pain.      . valsartan (DIOVAN) 80 MG tablet Take 1 tablet (80 mg total) by mouth daily.  30 tablet  3   No current facility-administered medications on file prior to visit.    No Known Allergies  Family History  Problem Relation Age of Onset  . CAD Neg Hx     BP 116/70  Pulse 83  Temp(Src) 97.9 F (36.6 C) (Oral)  Wt 190 lb (86.183 kg)  SpO2 97%  Review of Systems He denies hypoglycemia.  He has lost a few lbs.      Objective:   Physical Exam VITAL SIGNS:  See vs page GENERAL: no distress SKIN:  Insulin injection sites at the anterior abdomen are normal     Assessment & Plan:  DM:  type 1 is possible, due to extreme cbg variability and neg Fhx.  control is apparently improved.   Noncompliance with cbg recording: this complicates the rx of DM.    Dementia: this complicates the rx of DM

## 2013-06-12 NOTE — Patient Instructions (Addendum)
check your blood sugar 4 times a UJW:JXBJYNday:before the 3 meals, and at bedtime.  also check if you have symptoms of your blood sugar being too high or too low.  please keep a record of the readings and bring it to your next appointment here.  You can write it on any piece of paper.  please call us sooner if your blood sugar goes below 70, or if you have a lot of readings over 200.   For now, please continue the same insulins.   Please come back for a follow-up appointment in 2 months.

## 2013-06-13 ENCOUNTER — Telehealth: Payer: Self-pay | Admitting: *Deleted

## 2013-06-13 ENCOUNTER — Encounter: Payer: Self-pay | Admitting: Neurology

## 2013-06-13 ENCOUNTER — Encounter: Payer: Self-pay | Admitting: *Deleted

## 2013-06-13 ENCOUNTER — Ambulatory Visit (INDEPENDENT_AMBULATORY_CARE_PROVIDER_SITE_OTHER): Payer: Medicare HMO | Admitting: Neurology

## 2013-06-13 VITALS — BP 130/80 | HR 72 | Temp 97.5°F | Ht 66.93 in | Wt 188.4 lb

## 2013-06-13 DIAGNOSIS — R269 Unspecified abnormalities of gait and mobility: Secondary | ICD-10-CM

## 2013-06-13 DIAGNOSIS — E1149 Type 2 diabetes mellitus with other diabetic neurological complication: Secondary | ICD-10-CM

## 2013-06-13 DIAGNOSIS — E1340 Other specified diabetes mellitus with diabetic neuropathy, unspecified: Secondary | ICD-10-CM

## 2013-06-13 DIAGNOSIS — M4716 Other spondylosis with myelopathy, lumbar region: Secondary | ICD-10-CM

## 2013-06-13 DIAGNOSIS — R413 Other amnesia: Secondary | ICD-10-CM

## 2013-06-13 DIAGNOSIS — E1349 Other specified diabetes mellitus with other diabetic neurological complication: Secondary | ICD-10-CM

## 2013-06-13 NOTE — Patient Instructions (Signed)
1.  Physical therapy for gait training 2.  Return to clinic in 155-month   [facility]  Preventing Falls in the Home   Falls are common, often dreaded events in the lives of older people. Aside from the obvious injuries and even death that may result, falls can cause wide-ranging consequences including loss of independence, mental decline, decreased activity, and mobility. Younger people are also at risk of falling, especially those with chronic illnesses and fatigue.  Ways to reduce the risk for falling:  * Examine diet and medications. Warm foods and alcohol dilate blood vessels, which can lead to dizziness when standing. Sleep aids, antidepressants, and pain medications can also increase the likelihood of a fall.  * Get a vison exam. Poor vision, cataracts, and glaucoma increase the chances of falling.  * Check foot gear. Shoes should fit snugly and have a sturdy, nonskid sole and broad, low heel.  * Participate in a physician-approved exercise program to build and maintain muscle strength and improve balance and coordination.  * Increase vitamin D intake. Vitamin D improves muscle strength and increases the amount of calcium the body is able to absorb and deposit in bones.  How to prevent falls from common hazards:  * Floors - Remove all loose wires, cords, and throw rugs. Minimize clutter. Make sure rugs are anchored and smooth. Keep furniture in its usual place.  * Chairs - Use chairs with straight backs, armrests, and firm seats. Add firm cushions to existing pieces to add height.  * Bathroom - Install grab bars and non-skid tape in the tub or shower. Use a bathtub transfer bench or a shower chair with a back support. Use an elevated toilet seat and/or safety rails to assist standing from a low surface. Do not use towel racks or bathroom tissue holders to help you stand.  * Lighting - Make sure halls, stairways, and entrances are well-lit. Install a night light in your bathroom or  hallway. Make sure there is a light switch at the top and bottom of the staircase. Turn lights on if you get up in the middle of the night. Make sure lamps or light switches are within reach of the bed if you have to get up during the night.  * Kitchen - Install non-skid rubber mats near the sink and stove. Clean spills immediately. Store frequently used utensils, pots, and pans between waist and eye level. This helps prevent reaching and bending. Sit when getting things out of the lower cupboards.  * Living room / Bedrooms - Place furniture with wide spaces in between, giving enough room to move around. Establish a route through the living room that gives you something to hold onto as you walk.  * Stairs - Make sure treads, rails, and rugs are secure. Install a rail on both sides of the stairs. If stairs are a threat, it might be helpful to arrange most of your activities on the lower level to reduce the number of times you must climb the stairs.  * Entrances and doorways - Install metal handles on the walls adjacent to the doorknobs of all doors to make it more secure as you travel through the doorway.  Tips for maintaining balance:  * Keep at least one hand free at all times Try using a backpack or fanny pack to hold things rather than carrying them in your hands. Never carry objects in both hands when walking as this interferes with keeping your balance.  * Attempt to swing  both arms from front to back while walking. This might require a conscious effort if Parkinson's disease has diminished your movement. It will, however, help you to maintain balance and posture, and reduce fatigue.  * Consciously lift your feet off the ground when walking. Shuffling and dragging of the feet is a common culprit in losing your balance.  * When trying to navigate turns, use a "U" technique of facing forward and making a wide turn, rather than pivoting sharply.  * Try to stand with your feet shoulder-length apart. When  your feet are close together for any length of time, you increase your risk of losing your balance and falling.  * Do one thing at a time. Do not try to walk and accomplish another task, such as reading or looking around. The decrease in your automatic reflexes complicates motor function, so the less distraction, the better.  * Do not wear rubber or gripping soled shoes, they might "catch" on the floor and cause tripping.  * Move slowly when changing positions. Use deliberate, concentrated movements and, if needed, use a grab bar or walking aid. Count fifteen (15) seconds after standing to begin walking.  * If balance is a continuous problem, you might want to consider a walking aid such as a cane, walking stick, or walker. Once you have mastered walking with help, you may be ready to try it again on your own.  This information is provided by [facility] and is not intended to replace the medical advice of your physician or other health care providers. Please consult your physician or other health care providers for advice regarding your specific medical condition.    Life Alert Resources  Medical Alert Systems that can locate patients outside the home:  http://mobilealertsystems.com/ 508 139 2526 http://www.lifelinesys.com/content/lifeline-products/get-life-gosafe  (843)645-7922 www.verizonwireless.com/sure/ (506) 217-3731  Medial Alert systems that link to smart phones:  http://www.lifelinesys.com/content/lifeline-products/response-app https://www.montgomery-brown.info/ https://www.bond-cox.org/.aspx  Alzheimer's Association GPS Tracker:  http://www.gould-leon.com/ (262) 720-2105

## 2013-06-13 NOTE — Telephone Encounter (Signed)
Attempted to contact pt with no answer.  Will try again to inform pt that he has MRI brain set up for March 16 at 4:[facility].
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
## 2013-06-14 ENCOUNTER — Telehealth: Payer: Self-pay

## 2013-06-14 ENCOUNTER — Encounter: Payer: Self-pay | Admitting: *Deleted

## 2013-06-14 NOTE — Telephone Encounter (Signed)
Spoke with pt to let him know time and date of MRI but he said that he was lying down.  Requested for him to call me back when he gets up.

## 2013-06-14 NOTE — Telephone Encounter (Signed)
Relevant patient education assigned to patient using Emmi. ° °

## 2013-06-14 NOTE — Telephone Encounter (Signed)
Also sent letter to pt about MRI.

## 2013-06-16 ENCOUNTER — Encounter: Payer: Self-pay | Admitting: *Deleted

## 2013-06-16 NOTE — Telephone Encounter (Signed)
Called pt again and gave him MRI appt time and date.  March 16 at 4:00.  Also let him know that I mailed him a letter.

## 2013-06-19 ENCOUNTER — Encounter: Payer: Self-pay | Admitting: Internal Medicine

## 2013-06-20 ENCOUNTER — Other Ambulatory Visit: Payer: Self-pay | Admitting: Family

## 2013-06-20 ENCOUNTER — Other Ambulatory Visit: Payer: Self-pay | Admitting: *Deleted

## 2013-06-20 ENCOUNTER — Telehealth: Payer: Self-pay | Admitting: *Deleted

## 2013-06-20 ENCOUNTER — Encounter: Payer: Self-pay | Admitting: Family

## 2013-06-20 MED ORDER — ASPIRIN EC 81 MG PO TBEC: 81.0000 mg | DELAYED_RELEASE_TABLET | Freq: Every day | ORAL | Status: DC

## 2013-06-20 NOTE — Telephone Encounter (Signed)
Patient ok to come off plavix pt aware

## 2013-06-20 NOTE — Telephone Encounter (Signed)
OK to refill

## 2013-06-20 NOTE — Telephone Encounter (Signed)
Rx sent in

## 2013-06-20 NOTE — Telephone Encounter (Signed)
Ok to send in asa 81 refill for patient? Please advise. Thanks, MI

## 2013-06-21 ENCOUNTER — Telehealth: Payer: Self-pay | Admitting: Family

## 2013-06-21 MED ORDER — TRAMADOL HCL 50 MG PO TABS: 50.0000 mg | ORAL_TABLET | Freq: Three times a day (TID) | ORAL | Status: DC | PRN

## 2013-06-21 NOTE — Telephone Encounter (Signed)
William Aguirre speech therapist from [facility] did evaluation today and pt does not need any speech therapy. William JoinerRebecca would like a verbal order for social worker to visit pt.for consultation to access community resources and meal on wheels. It is ok to left message on vm

## 2013-06-22 NOTE — Telephone Encounter (Signed)
Left message to advise Lurena JoinerRebecca of note

## 2013-06-22 NOTE — Telephone Encounter (Signed)
Ok to have Presenter, broadcastingsocial worker evaluation. Please advise [facility]

## 2013-06-26 ENCOUNTER — Ambulatory Visit (HOSPITAL_COMMUNITY): Admission: RE | Admit: 2013-06-26 | Payer: Medicare HMO | Source: Ambulatory Visit

## 2013-06-26 ENCOUNTER — Telehealth: Payer: Self-pay | Admitting: Family

## 2013-06-26 NOTE — Telephone Encounter (Signed)
[facility] called to get a rx for pt's RELION MINI PEN NEEDLES 31G X 6 MM MISC They states they received a fax from his daughter in law to order his pen needles.  Fax [phone]

## 2013-06-27 ENCOUNTER — Ambulatory Visit: Payer: Medicare HMO | Attending: Neurology | Admitting: Physical Therapy

## 2013-06-27 MED ORDER — INSULIN PEN NEEDLE 31G X 6 MM MISC: Status: DC

## 2013-06-27 NOTE — Telephone Encounter (Signed)
Rx sent 

## 2013-06-28 ENCOUNTER — Telehealth: Payer: Self-pay | Admitting: Family

## 2013-06-28 NOTE — Telephone Encounter (Signed)
Daughter called and would like you to call her. Pt keeps falling. 2 x in 3 days. She would like home health to assist w/ bathing, adl's . FYI: daughter states mri has been canceled and she isn't sure why.

## 2013-06-29 NOTE — Telephone Encounter (Signed)
Pt needs Home Healthcare to help with bathing and daily living. Daughter states that pt hasn't had a bath in about 3 weeks and she needs help with this. Pt's daughter is POA and I have advised that we need the DPR on file also. DPR along with Request for Restriction on Use/Disclosure of Protected Health Information (to be completed restricting pt's estranged wife, Eligha BridegroomFrances Zeiders) is being mailed to [employer]annon Lovett at [employer] She states to me that she will personally return forms to the office.  Joyice FasterGabby will [facility] to get a complete understanding of role of [facility] nurse that visits pt. Message forwarded to [facility] as reminder to call Tiffany

## 2013-06-30 ENCOUNTER — Ambulatory Visit (HOSPITAL_COMMUNITY)
Admission: RE | Admit: 2013-06-30 | Discharge: 2013-06-30 | Disposition: A | Discharge status: Home / Self Care | Payer: Medicare HMO | Source: Ambulatory Visit | Attending: Neurology | Admitting: Neurology

## 2013-06-30 DIAGNOSIS — E1340 Other specified diabetes mellitus with diabetic neuropathy, unspecified: Secondary | ICD-10-CM

## 2013-06-30 DIAGNOSIS — M4716 Other spondylosis with myelopathy, lumbar region: Secondary | ICD-10-CM

## 2013-06-30 DIAGNOSIS — R413 Other amnesia: Secondary | ICD-10-CM | POA: Insufficient documentation

## 2013-06-30 DIAGNOSIS — E1149 Type 2 diabetes mellitus with other diabetic neurological complication: Secondary | ICD-10-CM

## 2013-06-30 DIAGNOSIS — R269 Unspecified abnormalities of gait and mobility: Secondary | ICD-10-CM

## 2013-06-30 NOTE — Telephone Encounter (Signed)
Tiffany stated that the patient is not home bound. The nurse that seen him, stated that he was not. The physical therapist also stated that he could not benefit from their help, as he was too dependant on his own already, and the speech therapist stated the same. Tiffany said that he may need to look into someone coming into the home to help bathe him, if the family feels he needs that. But he is not a candidate for home health.       Pt's daughter aware

## 2013-07-03 ENCOUNTER — Telehealth: Payer: Self-pay | Admitting: Internal Medicine

## 2013-07-03 NOTE — Telephone Encounter (Signed)
Spoke with patient's daughter and she said that the patient has not stopped his plavix.   She also said that he really had to be coaxed into doing this procedure, and that we should probably wait so the doctor could take off polyps.  She stated that the patient will not come back again.   She wants to know if he could have "something for his nerves."  Note of March 10th states that the patient should stop the plavix for 5 days prior to his exam, and that it was relayed to the patient.   The patient did not stop it.   Dr. Rhea BeltonPyrtle would like to either reschedule the procedure or have the patient come to see him in the office.  Procedure for tomorrow cancelled per Dr. Rhea BeltonPyrtle.   Patient to see him in the office on Aug 21, 2013 at 2:45pm.   Message left on daughters VM regarding the changes.

## 2013-07-04 ENCOUNTER — Telehealth: Payer: Self-pay | Admitting: Neurology

## 2013-07-04 ENCOUNTER — Telehealth: Payer: Self-pay | Admitting: Family

## 2013-07-04 ENCOUNTER — Encounter: Payer: Medicare HMO | Admitting: Internal Medicine

## 2013-07-04 NOTE — Telephone Encounter (Signed)
Attempted to call pt's daughter back.  LM for her to call me back.

## 2013-07-04 NOTE — Telephone Encounter (Signed)
Spoke with pt's daughter and went over MRI.  Also instructed her to call PCP for another home health eval since he will not go to PT.

## 2013-07-04 NOTE — Telephone Encounter (Signed)
Pt's daughter called/returning your call at 2:55PM.

## 2013-07-04 NOTE — Telephone Encounter (Signed)
Pt's daughter called wanting to speak to a nurse regarding her dad's imaging results. Please call pt's daughter

## 2013-07-04 NOTE — Telephone Encounter (Signed)
Pt had mri last thurs. They were told pt definitely needs home health care. daughter would like a CB

## 2013-07-05 NOTE — Telephone Encounter (Signed)
[facility] (f[facility] [phone]. She said you can put it to her attention when you send the RX for Lancets.

## 2013-07-05 NOTE — Telephone Encounter (Signed)
Left message for Kimberly/[facility] to callback.   Spoke with Carollee HerterShannon. She states that pt was denied HomeHealth because he told home health staff that he is able to drive. Daughter states that pt cannot drive. I discussed the notes from neurology that state pt refuses to go to PT, which is what Oran Reinadonda suggests that he needs. Advised pt's daughter that she may want to accompany pt to an OV, otherwise, all we have to go on is what pt says during visits by himself. Appointment scheduled for Tuesday 07/10/13

## 2013-07-06 ENCOUNTER — Telehealth: Payer: Self-pay | Admitting: Family

## 2013-07-06 ENCOUNTER — Telehealth: Payer: Self-pay

## 2013-07-06 MED ORDER — FREESTYLE LANCETS MISC: Status: DC

## 2013-07-06 NOTE — Telephone Encounter (Signed)
Rx given verbally to pharmacist

## 2013-07-06 NOTE — Telephone Encounter (Signed)
Daughter is calling in regards to pt needing a rx for his lancets, send to [facility] on [address].

## 2013-07-06 NOTE — Telephone Encounter (Signed)
sent 

## 2013-07-06 NOTE — Telephone Encounter (Signed)
Cala BradfordKimberly from  [facility] said this is a [facility] req . Req rx for Lancet to be called in  [facility] [phone]

## 2013-07-11 ENCOUNTER — Encounter: Payer: Self-pay | Admitting: Internal Medicine

## 2013-07-11 ENCOUNTER — Ambulatory Visit: Payer: Medicare HMO | Admitting: Family

## 2013-07-17 ENCOUNTER — Encounter: Payer: Self-pay | Admitting: Family

## 2013-07-17 ENCOUNTER — Ambulatory Visit (INDEPENDENT_AMBULATORY_CARE_PROVIDER_SITE_OTHER): Payer: Medicare HMO | Admitting: Family

## 2013-07-17 VITALS — BP 114/60 | HR 87 | Temp 97.5°F | Wt 177.0 lb

## 2013-07-17 DIAGNOSIS — IMO0001 Reserved for inherently not codable concepts without codable children: Secondary | ICD-10-CM

## 2013-07-17 DIAGNOSIS — E1165 Type 2 diabetes mellitus with hyperglycemia: Principal | ICD-10-CM

## 2013-07-17 DIAGNOSIS — F0390 Unspecified dementia without behavioral disturbance: Secondary | ICD-10-CM

## 2013-07-17 DIAGNOSIS — R634 Abnormal weight loss: Secondary | ICD-10-CM

## 2013-07-17 DIAGNOSIS — I10 Essential (primary) hypertension: Secondary | ICD-10-CM

## 2013-07-17 LAB — GLUCOSE, POCT (MANUAL RESULT ENTRY): POC Glucose: 156 mg/dL — AB (ref 70–99)

## 2013-07-17 NOTE — Progress Notes (Signed)
Pre visit review using our clinic review tool, if applicable. No additional management support is needed unless otherwise documented below in the visit note. 

## 2013-07-17 NOTE — Patient Instructions (Signed)

## 2013-07-18 ENCOUNTER — Encounter: Payer: Self-pay | Admitting: Family

## 2013-07-18 ENCOUNTER — Other Ambulatory Visit: Payer: Self-pay | Admitting: Family

## 2013-07-18 NOTE — Progress Notes (Signed)
   Subjective:    Patient ID: William EvertsHenry S Aguirre, male    DOB: Jul 29, 1934, 79 y.o.   MRN: [id number]  HPI 79 year old African American male, nonsmoker, with a history of type 2 diabetes type, hypertension, hyperlipidemia, memory impairment is in today accompanied by his daughter requesting home health care. His wife recently left him and he is home alone. Daughter is concerned that he is losing weight. Felt like his loss of weight is due to him being unable to prepare meals, dressing groom himself appropriately. He had a home health care referral previously and declined to services telling an agency that he was able to perform these things himself. He is now well aware that he needs assistance. He is under the care of endocrinology for management of type 2 diabetes out of control with the last hemoglobin being 13.8. Admits to eating candy bars.    Review of Systems  Constitutional: Negative.        Decreased appetite  HENT: Negative.   Respiratory: Negative.   Cardiovascular: Negative.   Gastrointestinal: Negative.   Endocrine: Negative.   Genitourinary: Negative.   Musculoskeletal: Negative.   Skin: Negative.   Allergic/Immunologic: Negative.   Neurological: Negative.   Psychiatric/Behavioral: Positive for agitation.       Objective:   Physical Exam  Constitutional: He is oriented to person, place, and time. He appears well-developed and well-nourished.  HENT:  Right Ear: External ear normal.  Left Ear: External ear normal.  Nose: Nose normal.  Mouth/Throat: Oropharynx is clear and moist.  Neck: Normal range of motion. Neck supple.  Cardiovascular: Normal rate, regular rhythm and normal heart sounds.   Pulmonary/Chest: Effort normal and breath sounds normal.  Abdominal: Soft. Bowel sounds are normal.  Musculoskeletal: Normal range of motion.  Neurological: He is alert and oriented to person, place, and time.  Skin: Skin is warm and dry.  Psychiatric: He has a normal mood and  affect.          Assessment & Plan:

## 2013-07-19 ENCOUNTER — Telehealth: Payer: Self-pay | Admitting: Family

## 2013-07-19 NOTE — Telephone Encounter (Signed)
[facility] wanting to make Padonda aware that they have a delay in ability to admit the pt in home health aleast a week out. Wanted to make her aware in case she needed to find another agency.

## 2013-07-19 NOTE — Telephone Encounter (Signed)
[facility] calling in regards to order for home health nursing, need verification of insurance, what causes pt to need home health at this time. (last visit note) Also needs to know if there is a care giver and contact information if available. [facility] address and faxed # has changed. [address], [place], [place] [id number], fax# [phone]

## 2013-07-20 ENCOUNTER — Telehealth: Payer: Self-pay

## 2013-07-20 NOTE — Telephone Encounter (Signed)
Note faxed to number provided.

## 2013-07-20 NOTE — Telephone Encounter (Signed)
Received a call from Aram BeechamCynthia and pt was accessed in the home.  Pt has problem with forgetting things (pt was pleasant and receptive), pt has unsteady gait but has a walker and cane in the home. Pt lives beside his family but they come over frequently throughout the day to check on patient. Aram BeechamCynthia will be doing medication management, disease process and fall risk measures with patient in the home.  Aram BeechamCynthia would like to order PT, OT, and a social worker to come out to see patient.  Pt has an upcoming GI appointment.  Pt told Aram BeechamCynthia he has bowel movements but is not eating well.  Aram BeechamCynthia thinks this could be from dementia as well.  She will see patient once a week for 3 to 4 weeks.

## 2013-07-21 NOTE — Telephone Encounter (Signed)
Please give a verbal order for OT, PT and a Child psychotherapistsocial worker consult.

## 2013-07-21 NOTE — Telephone Encounter (Signed)
Order given to [place] and Edwina with [facility]

## 2013-07-25 ENCOUNTER — Telehealth: Payer: Self-pay | Admitting: Family

## 2013-07-25 NOTE — Telephone Encounter (Signed)
Don occupational therapist from [facility] is call to notify pt will be having OT for twice a wk for 2 wks and once a wk for 1 wk. For iadls ,transfer training and adaptable equipment training

## 2013-07-26 NOTE — Telephone Encounter (Signed)
Noted  

## 2013-07-26 NOTE — Telephone Encounter (Signed)
FYI

## 2013-08-02 ENCOUNTER — Other Ambulatory Visit: Payer: Self-pay

## 2013-08-02 MED ORDER — ATORVASTATIN CALCIUM 80 MG PO TABS: 80.0000 mg | ORAL_TABLET | Freq: Every day | ORAL | Status: DC

## 2013-08-02 MED ORDER — INSULIN PEN NEEDLE 31G X 6 MM MISC: Status: DC

## 2013-08-10 ENCOUNTER — Telehealth: Payer: Self-pay | Admitting: Internal Medicine

## 2013-08-10 NOTE — Telephone Encounter (Signed)
I have explained that patient does need to see Dr. Rhea BeltonPyrtle prior to a colonoscopy.  He is placed on the cancellation list and have apologized for the inconvenience

## 2013-08-11 ENCOUNTER — Ambulatory Visit (INDEPENDENT_AMBULATORY_CARE_PROVIDER_SITE_OTHER): Payer: Medicare HMO | Admitting: Endocrinology

## 2013-08-11 ENCOUNTER — Encounter: Payer: Self-pay | Admitting: Endocrinology

## 2013-08-11 VITALS — BP 124/82 | HR 82 | Temp 97.6°F | Ht 66.0 in | Wt 177.0 lb

## 2013-08-11 DIAGNOSIS — E109 Type 1 diabetes mellitus without complications: Secondary | ICD-10-CM

## 2013-08-11 LAB — HEMOGLOBIN A1C: Hgb A1c MFr Bld: 7 % — ABNORMAL HIGH (ref 4.6–6.5)

## 2013-08-11 LAB — MICROALBUMIN / CREATININE URINE RATIO
Creatinine,U: 128.5 mg/dL
Microalb Creat Ratio: 0.5 mg/g (ref 0.0–30.0)
Microalb, Ur: 0.6 mg/dL (ref 0.0–1.9)

## 2013-08-11 NOTE — Patient Instructions (Addendum)
check your blood sugar 4 times a QIO:NGEXBMday:before the 3 meals, and at bedtime.  also check if you have symptoms of your blood sugar being too high or too low.  please keep a record of the readings and bring it to your next appointment here.  You can write it on any piece of paper.  please call us sooner if your blood sugar goes below 70, or if you have a lot of readings over 200.   For now, please continue the same insulins.   Please come back for a follow-up appointment in 3 months.  blood and urine tests are being requested for you today.  We'll contact you with results.

## 2013-08-11 NOTE — Progress Notes (Signed)
Subjective:    Patient ID: William Aguirre, male    DOB: December 23, 1934, 79 y.o.   MRN: [id number]  HPI pt returns for f/u of insulin-requiring DM (dx'ed 1986; he has mild if any neuropathy of the lower extremities, but he has associated CAD; he has been on insulin since soon after dx).  no cbg record, but states cbg's are in the 100's.  There is no trend throughout the day. Past Medical History  Diagnosis Date  . Diabetes mellitus without complication   . Hyperlipidemia   . Coronary artery disease     Moderate LAD, Diagonal, Circumflex disease by cath 2004  . Hypertension   . Dementia     Past Surgical History  Procedure Laterality Date  . None      History   Social History  . Marital Status: Married    Spouse Name: N/A    Number of Children: 4  . Years of Education: N/A   Occupational History  . Accountant    Social History Main Topics  . Smoking status: Never Smoker   . Smokeless tobacco: Never Used  . Alcohol Use: No  . Drug Use: No  . Sexual Activity: Not on file   Other Topics Concern  . Not on file   Social History Narrative   Lives alone in a [place].   Retired Engineer, maintenance (IT).    Current Outpatient Prescriptions on File Prior to Visit  Medication Sig Dispense Refill  . aspirin EC 81 MG tablet Take 1 tablet (81 mg total) by mouth daily.  30 tablet  3  . atorvastatin (LIPITOR) 80 MG tablet Take 1 tablet (80 mg total) by mouth daily.  30 tablet  1  . clopidogrel (PLAVIX) 75 MG tablet Take 75 mg by mouth daily with breakfast.      . ezetimibe (ZETIA) 10 MG tablet Take 10 mg by mouth daily.      Marland Kitchen FREESTYLE LITE test strip       . insulin aspart (NOVOLOG) 100 UNIT/ML injection Inject 6 Units into the skin 3 (three) times daily before meals. NOVOLOG PEN      . insulin glargine (LANTUS) 100 UNIT/ML injection Inject 12 Units into the skin at bedtime. lantus pen  One injection at night 10 units      . Insulin Pen Needle (RELION MINI PEN NEEDLES) 31G X 6 MM MISC Use  with insuin injections 4 times daily  200 each  3  . Lancets (FREESTYLE) lancets Use as instructed  100 each  0  . metoprolol (LOPRESSOR) 50 MG tablet Take 1 tablet (50 mg total) by mouth 2 (two) times daily.  60 tablet  11  . peg 3350 powder (MOVIPREP) 100 G SOLR Take 1 kit (200 g total) by mouth once.  1 kit  0  . traMADol (ULTRAM) 50 MG tablet Take 1 tablet (50 mg total) by mouth every 8 (eight) hours as needed for severe pain.  30 tablet  2  . valsartan (DIOVAN) 80 MG tablet Take 1 tablet (80 mg total) by mouth daily.  30 tablet  3   No current facility-administered medications on file prior to visit.    No Known Allergies  Family History  Problem Relation Age of Onset  . Other Mother     Deceased, 39  . Other Father     Deceased  . Other Sister     Deceased  . Cancer Sister     Deceased  . Healthy Son  Living  . Heart disease Son     Deceased, 33 from heart valve dysfunction  . Healthy Daughter     BP 124/82  Pulse 82  Temp(Src) 97.6 F (36.4 C) (Oral)  Ht _0  (1.676 m)  Wt 177 lb (80.287 kg)  BMI 28.58 kg/m2  SpO2 98%  Review of Systems He denies hypoglycemia.  He has lost more weight since last ov.       Objective:   Physical Exam VITAL SIGNS:  See vs page GENERAL: no distress  Lab Results  Component Value Date   HGBA1C 7.0* 08/11/2013      Assessment & Plan:  DM: glycemic control is much better. CAD: in this context, he should avoid hypoglycemia.

## 2013-08-14 ENCOUNTER — Telehealth: Payer: Self-pay | Admitting: Cardiovascular Disease

## 2013-08-14 NOTE — Telephone Encounter (Signed)
Spoke with pt who reports he thinks it is time to review his medications as he notices his weight is less at recent office visits than in the past. Records reviewed and he has been on Diovan since 2008.  He has appt with Dr. Clifton JamesMcAlhany on Aug 30, 2013 and I told pt medications would be reviewed at that time and changes made as needed. Phone note also indicates pt is asking about results of HgbA1C. I asked pt to contact Dr. George HughEllison's office to review these results.

## 2013-08-14 NOTE — Telephone Encounter (Signed)
Patient has been taken Divone. And has questions regarding this mediation. Please call and advise.

## 2013-08-15 NOTE — Telephone Encounter (Signed)
Patient informed that A1C was a 7. Pt advised to continue same medication and follow up in 3 months.

## 2013-08-21 ENCOUNTER — Ambulatory Visit: Payer: Medicare HMO | Admitting: Internal Medicine

## 2013-08-30 ENCOUNTER — Encounter: Payer: Self-pay | Admitting: Cardiovascular Disease

## 2013-08-30 ENCOUNTER — Ambulatory Visit (INDEPENDENT_AMBULATORY_CARE_PROVIDER_SITE_OTHER): Payer: Medicare HMO | Admitting: Cardiovascular Disease

## 2013-08-30 VITALS — BP 120/64 | HR 74 | Ht 66.0 in | Wt 171.0 lb

## 2013-08-30 DIAGNOSIS — E785 Hyperlipidemia, unspecified: Secondary | ICD-10-CM

## 2013-08-30 DIAGNOSIS — I10 Essential (primary) hypertension: Secondary | ICD-10-CM

## 2013-08-30 DIAGNOSIS — I2510 Atherosclerotic heart disease of native coronary artery without angina pectoris: Secondary | ICD-10-CM

## 2013-08-30 NOTE — Patient Instructions (Signed)
Your physician wants you to follow-up in:  6 months. You will receive a reminder letter in the mail two months in advance. If you don't receive a letter, please call our office to schedule the follow-up appointment.  Your physician recommends that you return for fasting lab work in:  About 12 weeks. --This will be the week of November 20, 2013.  The lab opens at 7:30 AM every weekday.   Your physician has recommended you make the following change in your medication:  Stop Zetia.

## 2013-08-30 NOTE — Progress Notes (Signed)
History of Present Illness: 80 yo male with history of CAD, HTN, HLD, DM who is here today for cardiac follow up. He has been followed in the past by Dr. Lia Foyer. His last cath was in 2004 and showed moderate LAD, Diagonal, Circumflex disease with diabetic appearance of vessels (see below). He has been managed medically since then. Exercise stress test 05/31/12 and he exercised for 3 minutes and stopped due to fatigue. He and Dr. Lia Foyer discussed arranging a stress myoview but the patient declined.  He is here today for follow up. No chest pain or SOB.   Primary Care Physician: Phoebe Sharps  Last Lipid Profile: Followed in primary care  Past Medical History  Diagnosis Date  . Diabetes mellitus without complication   . Hyperlipidemia   . Coronary artery disease     Moderate LAD, Diagonal, Circumflex disease by cath 2004  . Hypertension   . Dementia     Past Surgical History  Procedure Laterality Date  . None      Current Outpatient Prescriptions  Medication Sig Dispense Refill  . aspirin EC 81 MG tablet Take 1 tablet (81 mg total) by mouth daily.  30 tablet  3  . atorvastatin (LIPITOR) 80 MG tablet Take 1 tablet (80 mg total) by mouth daily.  30 tablet  1  . clopidogrel (PLAVIX) 75 MG tablet Take 75 mg by mouth daily with breakfast.      . ezetimibe (ZETIA) 10 MG tablet Take 10 mg by mouth daily.      Marland Kitchen FREESTYLE LITE test strip       . insulin aspart (NOVOLOG) 100 UNIT/ML injection Inject 6 Units into the skin 3 (three) times daily before meals. NOVOLOG PEN      . insulin glargine (LANTUS) 100 UNIT/ML injection Inject 12 Units into the skin at bedtime. lantus pen  One injection at night 10 units      . Insulin Pen Needle (RELION MINI PEN NEEDLES) 31G X 6 MM MISC Use with insuin injections 4 times daily  200 each  3  . Lancets (FREESTYLE) lancets Use as instructed  100 each  0  . metoprolol (LOPRESSOR) 50 MG tablet Take 1 tablet (50 mg total) by mouth 2 (two) times daily.  60  tablet  11  . peg 3350 powder (MOVIPREP) 100 G SOLR Take 1 kit (200 g total) by mouth once.  1 kit  0  . traMADol (ULTRAM) 50 MG tablet Take 1 tablet (50 mg total) by mouth every 8 (eight) hours as needed for severe pain.  30 tablet  2  . valsartan (DIOVAN) 80 MG tablet Take 1 tablet (80 mg total) by mouth daily.  30 tablet  3   No current facility-administered medications for this visit.    No Known Allergies  History   Social History  . Marital Status: Married    Spouse Name: N/A    Number of Children: 4  . Years of Education: N/A   Occupational History  . Accountant    Social History Main Topics  . Smoking status: Never Smoker   . Smokeless tobacco: Never Used  . Alcohol Use: No  . Drug Use: No  . Sexual Activity: Not on file   Other Topics Concern  . Not on file   Social History Narrative   Lives alone in a [place] home.   Retired Engineer, maintenance (IT).    Family History  Problem Relation Age of Onset  . Other Mother  Deceased, 34  . Other Father     Deceased  . Other Sister     Deceased  . Cancer Sister     Deceased  . Healthy Son     Living  . Heart disease Son     Deceased, 96 from heart valve dysfunction  . Healthy Daughter     Review of Systems:  As stated in the HPI and otherwise negative.   BP 120/64  Pulse 74  Ht 5' 6"  (1.676 m)  Wt 171 lb (77.565 kg)  BMI 27.61 kg/m2  Physical Examination: General: Well developed, well nourished, NAD HEENT: OP clear, mucus membranes moist SKIN: warm, dry. No rashes. Neuro: No focal deficits Musculoskeletal: Muscle strength 5/5 all ext Psychiatric: Mood and affect normal Neck: No JVD, no carotid bruits, no thyromegaly, no lymphadenopathy. Lungs:Clear bilaterally, no wheezes, rhonci, crackles Cardiovascular: Regular rate and rhythm. No murmurs, gallops or rubs. Abdomen:Soft. Bowel sounds present. Non-tender.  Extremities: No lower extremity edema. Pulses are 2 + in the bilateral DP/PT.  Cardiac cath December  2004: 1. Ventriculography was performed in the RAO projection. Overall systolic  function was mildly reduced. Ejection fraction was calculated at 47% but  appeared to be visually better. There was hypokinesis of the inferobasal  segment. No significant mitral regurgitation was noted.  2. The left main coronary artery was free of critical disease.  3. The left anterior descending artery coursed to the apex. The LAD and its  branch had a fairly typical diabetic appearance with multiple areas of  luminal irregularity. Beyond the origin of the major diagonal branch was  a 40-50% area of focal stenosis that was slightly hypodense that appeared  to be less severe in some views than others. There was also a 30-40%  ostial narrowing of the major diagonal branch. There was moderate  plaquing noted at the apical portion of the LAD as well; critical  stenoses, however, were not noted.  4. The circumflex is a dominant vessel. There is a first marginal branch  that has a typical diabetic appearance and a focal area of about 60%  narrowing. There is also a second marginal branch that bifurcates  distally and again has diffuse luminal irregularities compatible with  diabetes. The A-V circumflex courses to the posterior wall, where it  supplies two posterolateral branches and then is totally occluded,  functioning as a posterior descending branch. In the initial RAO films,  there was evidence of some collateralization of the distal PDA, and this  had an appearance of being somewhat diffusely irregular.  5. The right coronary artery was a nondominant vessel that was free of  critical disease.  CONCLUSIONS:  1. Mild reduction in left ventricular function with an inferobasal wall  motion abnormality but with hypokinesis and not akinesis.  2. Currently normal electrocardiogram.  3. Total occlusion of the distal circumflex prior to the posterior  descending branch with early collateralization of this  vessel.  4. Moderate disease of the left anterior descending and diagonal system.  5. Typical diabetic appearance of the coronary arteries.  Echo 03/15/13: Left ventricle: The cavity size was normal. Wall thickness was normal. Systolic function was vigorous. The estimated ejection fraction was in the range of 65% to 70%. Wall motion was normal; there were no regional wall motion abnormalities. Doppler parameters are consistent with abnormal left ventricular relaxation (grade 1 diastolic dysfunction).   EKG: NSR, rate 74 bpm.   Assessment and Plan:   1. CAD: Overall stable. He only  has angina with heavy exertion. I have discussed arranging a stress myoview to exclude ischemia but he does not wish to do so at this time. No changes in therapy. Continue ASA/Plavix/statin/beta blocker. He will call with a change in his symptoms. LVEF normal on echo 03/15/13.  2. Hyperlipidemia: He is on a statin and Zetia. Will stop Zetia due to cost and check lipids and LFTs in 12 weeks.   3. HTN: BP well controlled. No changes today.   4. Diabetes mellitus: Managed in primary care. Reports good control of blood sugars.

## 2013-08-31 ENCOUNTER — Telehealth: Payer: Self-pay | Admitting: Family

## 2013-08-31 DIAGNOSIS — E119 Type 2 diabetes mellitus without complications: Secondary | ICD-10-CM

## 2013-08-31 MED ORDER — FREESTYLE LANCETS MISC: Status: DC

## 2013-08-31 NOTE — Telephone Encounter (Signed)
Pls advise; per last note pt needs to test 4 times daily.

## 2013-08-31 NOTE — Telephone Encounter (Signed)
[facility] [place], [place] - [phone] [address] is requesting re-fill on Lancets (FREESTYLE) lancets

## 2013-09-01 ENCOUNTER — Telehealth: Payer: Self-pay | Admitting: Family

## 2013-09-01 NOTE — Telephone Encounter (Signed)
[facility] [place], [place] - [id number] [address] is requesting re-fill on   FREESTYLE LITE test strip

## 2013-09-01 NOTE — Telephone Encounter (Signed)
Filled through endo. Please send. Thanks!  

## 2013-09-01 NOTE — Telephone Encounter (Signed)
Filled through endo. Please send. Thanks!

## 2013-09-06 ENCOUNTER — Ambulatory Visit: Payer: Medicare HMO | Admitting: Internal Medicine

## 2013-09-12 ENCOUNTER — Other Ambulatory Visit: Payer: Self-pay

## 2013-09-12 MED ORDER — CLOPIDOGREL BISULFATE 75 MG PO TABS: 75.0000 mg | ORAL_TABLET | Freq: Every day | ORAL | Status: DC

## 2013-09-13 ENCOUNTER — Ambulatory Visit (INDEPENDENT_AMBULATORY_CARE_PROVIDER_SITE_OTHER): Payer: Medicare HMO | Admitting: Licensed Clinical Social Worker

## 2013-09-13 DIAGNOSIS — F4320 Adjustment disorder, unspecified: Secondary | ICD-10-CM

## 2013-09-15 ENCOUNTER — Telehealth: Payer: Self-pay | Admitting: Family

## 2013-09-15 MED ORDER — GLUCOSE BLOOD VI STRP: ORAL_STRIP | Status: DC

## 2013-09-15 NOTE — Telephone Encounter (Signed)
Pt is needing new rx FREESTYLE LITE test strip, please fax or e-scribe, [facility].

## 2013-09-15 NOTE — Telephone Encounter (Signed)
Rx sent 

## 2013-09-19 ENCOUNTER — Ambulatory Visit (INDEPENDENT_AMBULATORY_CARE_PROVIDER_SITE_OTHER): Payer: Medicare HMO | Admitting: Licensed Clinical Social Worker

## 2013-09-19 DIAGNOSIS — F4320 Adjustment disorder, unspecified: Secondary | ICD-10-CM

## 2013-09-21 ENCOUNTER — Encounter: Payer: Self-pay | Admitting: Internal Medicine

## 2013-09-25 ENCOUNTER — Encounter: Payer: Self-pay | Admitting: Internal Medicine

## 2013-09-25 ENCOUNTER — Ambulatory Visit (INDEPENDENT_AMBULATORY_CARE_PROVIDER_SITE_OTHER): Payer: Medicare HMO | Admitting: Internal Medicine

## 2013-09-25 VITALS — BP 128/66 | HR 68 | Ht 66.0 in | Wt 189.8 lb

## 2013-09-25 DIAGNOSIS — K5900 Constipation, unspecified: Secondary | ICD-10-CM

## 2013-09-25 DIAGNOSIS — Z1211 Encounter for screening for malignant neoplasm of colon: Secondary | ICD-10-CM

## 2013-09-25 DIAGNOSIS — K5909 Other constipation: Secondary | ICD-10-CM

## 2013-09-25 DIAGNOSIS — Z7902 Long term (current) use of antithrombotics/antiplatelets: Secondary | ICD-10-CM

## 2013-09-25 MED ORDER — LINACLOTIDE 145 MCG PO CAPS: 145.0000 ug | ORAL_CAPSULE | Freq: Every day | ORAL | Status: DC

## 2013-09-25 NOTE — Patient Instructions (Signed)
You have signed a Cologuard form, [facility] will contact you to set up delivery of your kit.  We have sent the following medications to your pharmacy for you to pick up at your convenience: Linzess

## 2013-09-25 NOTE — Progress Notes (Signed)
Patient ID: William Aguirre, male   DOB: 1934/06/21, 79 y.o.   MRN: [id number] HPI: William Aguirre is a 79 yo male with a past medical history of constipation, CAD on Plavix, hypertension, hyperlipidemia and diabetes who is seen today in consultation at the request of Roxy Cedar, NP to discuss colon cancer screening and chronic constipation. He is here today with his wife. He reports chronic long-standing constipation which has not changed. He tried MiraLax without benefit. His wife feels maybe he did try long enough. He denies blood in his stool or melena. He denies abdominal pain. He denies weight loss. No family history of colon cancer to his knowledge. He denies issues with heartburn, dysphagia or odynophagia. No hepatobiliary complaints. He did have a colonoscopy scheduled but canceled due to issues regarding antiplatelet therapy and also hesitancy to proceed. He says he would rather have nothing done but his wife and PCP feel otherwise  Past Medical History  Diagnosis Date  . Diabetes mellitus without complication   . Hyperlipidemia   . Coronary artery disease     Moderate LAD, Diagonal, Circumflex disease by cath 2004  . Hypertension   . Dementia     Past Surgical History  Procedure Laterality Date  . None      Current Outpatient Prescriptions  Medication Sig Dispense Refill  . aspirin EC 81 MG tablet Take 1 tablet (81 mg total) by mouth daily.  30 tablet  3  . atorvastatin (LIPITOR) 80 MG tablet Take 1 tablet (80 mg total) by mouth daily.  30 tablet  1  . clopidogrel (PLAVIX) 75 MG tablet Take 1 tablet (75 mg total) by mouth daily with breakfast.  90 tablet  1  . glucose blood (FREESTYLE LITE) test strip Use to check blood sugar daily  100 each  11  . insulin aspart (NOVOLOG) 100 UNIT/ML injection Inject 6 Units into the skin 3 (three) times daily before meals. NOVOLOG PEN      . insulin glargine (LANTUS) 100 UNIT/ML injection Inject 12 Units into the skin at bedtime. lantus pen   One injection at night 10 units      . Insulin Pen Needle (RELION MINI PEN NEEDLES) 31G X 6 MM MISC Use with insuin injections 4 times daily  200 each  3  . Lancets (FREESTYLE) lancets Use as instructed  100 each  5  . metoprolol (LOPRESSOR) 50 MG tablet Take 1 tablet (50 mg total) by mouth 2 (two) times daily.  60 tablet  11  . peg 3350 powder (MOVIPREP) 100 G SOLR Take 1 kit (200 g total) by mouth once.  1 kit  0  . traMADol (ULTRAM) 50 MG tablet Take 1 tablet (50 mg total) by mouth every 8 (eight) hours as needed for severe pain.  30 tablet  2  . valsartan (DIOVAN) 80 MG tablet Take 1 tablet (80 mg total) by mouth daily.  30 tablet  3  . Linaclotide (LINZESS) 145 MCG CAPS capsule Take 1 capsule (145 mcg total) by mouth daily.  30 capsule  6   No current facility-administered medications for this visit.    No Known Allergies  Family History  Problem Relation Age of Onset  . Other Mother     Deceased, 26  . Other Father     Deceased  . Other Sister     Deceased  . Cancer Sister     Deceased  . Healthy Son     Living  . Heart disease  Son     Deceased, 23 from heart valve dysfunction  . Healthy Daughter     History  Substance Use Topics  . Smoking status: Never Smoker   . Smokeless tobacco: Never Used  . Alcohol Use: No    ROS: As per history of present illness, otherwise negative  BP 128/66  Pulse 68  Ht _0  (1.676 m)  Wt 189 lb 12.8 oz (86.093 kg)  BMI 30.65 kg/m2 Constitutional: Well-developed and well-nourished. No distress. HEENT: Normocephalic and atraumatic. No scleral icterus. Neck: Neck supple. Trachea midline. Cardiovascular: Normal rate, regular rhythm and intact distal pulses. No M/R/G Pulmonary/chest: Effort normal and breath sounds normal. No wheezing, rales or rhonchi. Abdominal: Soft, nontender, nondistended. Bowel sounds active throughout.  Extremities: no clubbing, cyanosis, or edema Neurological: Alert and oriented to person place and  time. Psychiatric: Normal mood and affect. Behavior is normal.  RELEVANT LABS AND IMAGING: CBC    Component Value Date/Time   WBC 9.6 05/28/2013 1808   RBC 5.31 05/28/2013 1808   HGB 14.1 05/28/2013 1808   HCT 42.8 05/28/2013 1808   PLT 148* 05/28/2013 1808   MCV 80.6 05/28/2013 1808   MCH 26.6 05/28/2013 1808   MCHC 32.9 05/28/2013 1808   RDW 13.8 05/28/2013 1808    CMP     Component Value Date/Time   NA 136* 05/28/2013 1808   K 4.2 05/28/2013 1808   CL 98 05/28/2013 1808   CO2 22 05/28/2013 1808   GLUCOSE 164* 05/28/2013 1808   BUN 12 05/28/2013 1808   CREATININE 1.10 05/28/2013 1808   CALCIUM 10.0 05/28/2013 1808   PROT 8.0 05/28/2013 1808   ALBUMIN 3.8 05/28/2013 1808   AST 19 05/28/2013 1808   ALT 17 05/28/2013 1808   ALKPHOS 144* 05/28/2013 1808   BILITOT 0.5 05/28/2013 1808   GFRNONAA 63* 05/28/2013 1808   GFRAA 73* 05/28/2013 1808    ASSESSMENT/PLAN: 79 yo male with a past medical history of constipation, CAD on Plavix, hypertension, hyperlipidemia and diabetes who is seen today to discuss colon cancer screening and chronic constipation.  1. CRC screening -- he is average risk but never had a colonoscopy. I recommended colon cancer screening with either colonoscopy or Cologuard.  We discussed the differences today at length, for greater than 20 minutes.  The Cologuard does not require that he hold Plavix and can be performed at home. He understands that this test is positive he will need a colonoscopy, and prior to the colonoscopy his Plavix would need to be held with permission from his cardiologist. After a thorough discussion, he prefers to proceed with cologuard.  This test will be ordered for him.  2.  Chronic constipation -- no response to MiraLax. Trial of Linzess 145 mcg daily. We discussed diarrhea is the main side effect and that he should notify me with any diarrhea or other concerning side effect. He voices understanding

## 2013-10-11 ENCOUNTER — Telehealth: Payer: Self-pay | Admitting: Family

## 2013-10-11 MED ORDER — VALSARTAN 80 MG PO TABS: 80.0000 mg | ORAL_TABLET | Freq: Every day | ORAL | Status: DC

## 2013-10-11 NOTE — Telephone Encounter (Signed)
[facility] [place], [place] - [phone] [address] is requesting re-fill on valsartan (DIOVAN) 80 MG tablet

## 2013-10-11 NOTE — Telephone Encounter (Signed)
Done

## 2013-10-12 ENCOUNTER — Other Ambulatory Visit: Payer: Self-pay

## 2013-10-12 MED ORDER — ASPIRIN EC 81 MG PO TBEC: 81.0000 mg | DELAYED_RELEASE_TABLET | Freq: Every day | ORAL | Status: DC

## 2013-10-19 ENCOUNTER — Other Ambulatory Visit: Payer: Self-pay | Admitting: *Deleted

## 2013-10-19 LAB — COLOGUARD: COLOGUARD: NEGATIVE

## 2013-10-19 MED ORDER — ATORVASTATIN CALCIUM 80 MG PO TABS: 80.0000 mg | ORAL_TABLET | Freq: Every day | ORAL | Status: DC

## 2013-11-02 ENCOUNTER — Encounter: Payer: Self-pay | Admitting: Internal Medicine

## 2013-11-07 ENCOUNTER — Telehealth: Payer: Self-pay | Admitting: Internal Medicine

## 2013-11-07 NOTE — Telephone Encounter (Signed)
Left message for patient to call back  

## 2013-11-08 NOTE — Telephone Encounter (Signed)
Patient notified of negative cologuard results.  He will call back for any additional questions or concerns

## 2013-11-20 ENCOUNTER — Ambulatory Visit (INDEPENDENT_AMBULATORY_CARE_PROVIDER_SITE_OTHER): Payer: Medicare HMO | Admitting: Endocrinology

## 2013-11-20 ENCOUNTER — Encounter: Payer: Self-pay | Admitting: Endocrinology

## 2013-11-20 ENCOUNTER — Other Ambulatory Visit (INDEPENDENT_AMBULATORY_CARE_PROVIDER_SITE_OTHER): Payer: Medicare HMO

## 2013-11-20 VITALS — BP 128/78 | HR 68 | Temp 98.5°F | Ht 66.0 in | Wt 189.0 lb

## 2013-11-20 DIAGNOSIS — E785 Hyperlipidemia, unspecified: Secondary | ICD-10-CM

## 2013-11-20 DIAGNOSIS — E109 Type 1 diabetes mellitus without complications: Secondary | ICD-10-CM

## 2013-11-20 LAB — HEPATIC FUNCTION PANEL
ALBUMIN: 3.7 g/dL (ref 3.5–5.2)
ALT: 14 U/L (ref 0–53)
AST: 18 U/L (ref 0–37)
Alkaline Phosphatase: 102 U/L (ref 39–117)
Bilirubin, Direct: 0 mg/dL (ref 0.0–0.3)
TOTAL PROTEIN: 7.3 g/dL (ref 6.0–8.3)
Total Bilirubin: 0.6 mg/dL (ref 0.2–1.2)

## 2013-11-20 LAB — LIPID PANEL
CHOLESTEROL: 153 mg/dL (ref 0–200)
HDL: 49.4 mg/dL (ref >39.00)
LDL Cholesterol: 87 mg/dL (ref 0–99)
NonHDL: 103.6
Total CHOL/HDL Ratio: 3
Triglycerides: 84 mg/dL (ref 0.0–149.0)
VLDL: 16.8 mg/dL (ref 0.0–40.0)

## 2013-11-20 LAB — HEMOGLOBIN A1C: Hgb A1c MFr Bld: 6.9 % — ABNORMAL HIGH (ref 4.6–6.5)

## 2013-11-20 NOTE — Progress Notes (Signed)
Subjective:    Patient ID: William Aguirre, male    DOB: 1935-03-18, 79 y.o.   MRN: [id number]  HPI pt returns for f/u of insulin-requiring DM (dx'ed 1986; he has mild if any neuropathy of the lower extremities, but he has associated CAD; he has been on insulin since soon after dx; he has never had pancreatitis, severe hypoglycemia, or DKA; he takes multiple daily injections).  no cbg record, but states cbg's are well-controlled.  There is no trend throughout the day.  He thinks he might have recently had an episode of mild hypoglycemia before lunch, but he is not sure.  He says he seldom misses the insulin.  Past Medical History  Diagnosis Date  . Diabetes mellitus without complication   . Hyperlipidemia   . Coronary artery disease     Moderate LAD, Diagonal, Circumflex disease by cath 2004  . Hypertension   . Dementia     Past Surgical History  Procedure Laterality Date  . None      History   Social History  . Marital Status: Married    Spouse Name: N/A    Number of Children: 4  . Years of Education: N/A   Occupational History  . Accountant    Social History Main Topics  . Smoking status: Never Smoker   . Smokeless tobacco: Never Used  . Alcohol Use: No  . Drug Use: No  . Sexual Activity: Not on file   Other Topics Concern  . Not on file   Social History Narrative   Lives alone in a [place] home.   Retired Engineer, maintenance (IT).    Current Outpatient Prescriptions on File Prior to Visit  Medication Sig Dispense Refill  . aspirin EC 81 MG tablet Take 1 tablet (81 mg total) by mouth daily.  90 tablet  1  . atorvastatin (LIPITOR) 80 MG tablet Take 1 tablet (80 mg total) by mouth daily.  30 tablet  3  . clopidogrel (PLAVIX) 75 MG tablet Take 1 tablet (75 mg total) by mouth daily with breakfast.  90 tablet  1  . glucose blood (FREESTYLE LITE) test strip Use to check blood sugar daily  100 each  11  . insulin aspart (NOVOLOG) 100 UNIT/ML injection Inject 6 Units into the skin 3  (three) times daily before meals. NOVOLOG PEN      . insulin glargine (LANTUS) 100 UNIT/ML injection Inject 12 Units into the skin at bedtime. lantus pen  One injection at night 10 units      . Insulin Pen Needle (RELION MINI PEN NEEDLES) 31G X 6 MM MISC Use with insuin injections 4 times daily  200 each  3  . Lancets (FREESTYLE) lancets Use as instructed  100 each  5  . Linaclotide (LINZESS) 145 MCG CAPS capsule Take 1 capsule (145 mcg total) by mouth daily.  30 capsule  6  . metoprolol (LOPRESSOR) 50 MG tablet Take 1 tablet (50 mg total) by mouth 2 (two) times daily.  60 tablet  11  . traMADol (ULTRAM) 50 MG tablet Take 1 tablet (50 mg total) by mouth every 8 (eight) hours as needed for severe pain.  30 tablet  2  . valsartan (DIOVAN) 80 MG tablet Take 1 tablet (80 mg total) by mouth daily.  30 tablet  3  . peg 3350 powder (MOVIPREP) 100 G SOLR Take 1 kit (200 g total) by mouth once.  1 kit  0   No current facility-administered medications on file prior  to visit.    No Known Allergies  Family History  Problem Relation Age of Onset  . Other Mother     Deceased, 93  . Other Father     Deceased  . Other Sister     Deceased  . Cancer Sister     Deceased  . Healthy Son     Living  . Heart disease Son     Deceased, 41 from heart valve dysfunction  . Healthy Daughter     BP 128/78  Pulse 68  Temp(Src) 98.5 F (36.9 C) (Oral)  Ht 5' 6"  (1.676 m)  Wt 189 lb (85.73 kg)  BMI 30.52 kg/m2  SpO2 98%    Review of Systems He denies LOC and weight change.    Objective:   Physical Exam Pulses: dorsalis pedis intact bilat.   Feet: no deformity. normal color and temp.  no edema Skin:  no ulcer on the feet.   Neuro: sensation is intact to touch on the feet.    Lab Results  Component Value Date   HGBA1C 6.9* 11/20/2013      Assessment & Plan:  DM: well-controlled.    Patient is advised the following: Patient Instructions  check your blood sugar 4 times a NMM:HWKGSU the 3  meals, and at bedtime.  also check if you have symptoms of your blood sugar being too high or too low.  please keep a record of the readings and bring it to your next appointment here.  You can write it on any piece of paper.  please call us sooner if your blood sugar goes below 70, or if you have a lot of readings over 200.   For now, please continue the same insulins.   Please come back for a follow-up appointment in 3 months.  blood and urine tests are being requested for you today.  We'll contact you with results.

## 2013-11-20 NOTE — Patient Instructions (Addendum)
check your blood sugar 4 times a day:before the 3 meals, and at bedtime.  also check if you have symptoms of your blood sugar being too high or too low.  please keep a record of the readings and bring it to your next appointment here.  You can write it on any piece of paper.  please call us sooner if your blood sugar goes below 70, or if you have a lot of readings over 200.   For now, please continue the same insulins.   Please come back for a follow-up appointment in 3 months.  blood and urine tests are being requested for you today.  We'll contact you with results. 

## 2013-11-28 ENCOUNTER — Telehealth: Payer: Self-pay | Admitting: Endocrinology

## 2013-11-28 NOTE — Telephone Encounter (Signed)
Lvom informing that pt's A1C was normal. Instructed to continue same insulin and follow up as scheduled.

## 2013-11-28 NOTE — Telephone Encounter (Signed)
Patient called and would like to have his lab results   Thank you

## 2013-11-30 ENCOUNTER — Telehealth: Payer: Self-pay | Admitting: Family

## 2013-11-30 NOTE — Telephone Encounter (Signed)
Willa called from [facility] . They are trying to verify how often  he test his blood sugar  Lancets (FREESTYLE) lancets and the glucose blood (FREESTYLE LITE) test strip   She said the instructions they have are not matching .    Ancil BoozerWilla [phone]

## 2013-12-01 NOTE — Telephone Encounter (Addendum)
Spoke with [facility]. Gave instructions for testing frequency.

## 2013-12-01 NOTE — Telephone Encounter (Signed)
S/w with Willa she will contact Dr Everardo AllEllison

## 2013-12-01 NOTE — Telephone Encounter (Signed)
That would be a question for the Endocrinologist. Pt sees Dr. Everardo AllEllison

## 2013-12-08 ENCOUNTER — Encounter: Payer: Self-pay | Admitting: Family

## 2013-12-08 ENCOUNTER — Ambulatory Visit (INDEPENDENT_AMBULATORY_CARE_PROVIDER_SITE_OTHER): Payer: Commercial Managed Care - HMO | Admitting: Family

## 2013-12-08 VITALS — BP 132/76 | HR 62 | Ht 66.0 in | Wt 191.4 lb

## 2013-12-08 DIAGNOSIS — M5137 Other intervertebral disc degeneration, lumbosacral region: Secondary | ICD-10-CM

## 2013-12-08 DIAGNOSIS — E119 Type 2 diabetes mellitus without complications: Secondary | ICD-10-CM

## 2013-12-08 DIAGNOSIS — M5136 Other intervertebral disc degeneration, lumbar region: Secondary | ICD-10-CM

## 2013-12-08 DIAGNOSIS — I10 Essential (primary) hypertension: Secondary | ICD-10-CM

## 2013-12-08 MED ORDER — TRAMADOL HCL 50 MG PO TABS: 50.0000 mg | ORAL_TABLET | Freq: Two times a day (BID) | ORAL | Status: DC | PRN

## 2013-12-08 NOTE — Progress Notes (Signed)
Pre visit review using our clinic review tool, if applicable. No additional management support is needed unless otherwise documented below in the visit note. 

## 2013-12-08 NOTE — Patient Instructions (Signed)

## 2013-12-08 NOTE — Progress Notes (Signed)
Subjective:    Patient ID: William Aguirre, male    DOB: 1934-12-24, 79 y.o.   MRN: [id number]  HPI 79 year old AAM, nonsmoker, is in today for a recheck. Has a history of Type 2 DM that has been well controlled by endocrinology, Hypertension, DDD with current low back pain that responds to Tramadol. Pain 6/10, worse at night. Pain worse with sitting for an extended period of time.    Review of Systems  Constitutional: Negative.   HENT: Negative.   Respiratory: Negative.   Cardiovascular: Negative.   Gastrointestinal: Negative.   Endocrine: Negative.   Genitourinary: Negative.   Musculoskeletal: Negative.   Skin: Negative.   Neurological: Negative.   Hematological: Negative.   Psychiatric/Behavioral: Negative.    Past Medical History  Diagnosis Date  . Diabetes mellitus without complication   . Hyperlipidemia   . Coronary artery disease     Moderate LAD, Diagonal, Circumflex disease by cath 2004  . Hypertension   . Dementia     History   Social History  . Marital Status: Married    Spouse Name: N/A    Number of Children: 4  . Years of Education: N/A   Occupational History  . Accountant    Social History Main Topics  . Smoking status: Never Smoker   . Smokeless tobacco: Never Used  . Alcohol Use: No  . Drug Use: No  . Sexual Activity: Not on file   Other Topics Concern  . Not on file   Social History Narrative   Lives alone in a [place] home.   Retired IT trainer.    Past Surgical History  Procedure Laterality Date  . None      Family History  Problem Relation Age of Onset  . Other Mother     Deceased, 51  . Other Father     Deceased  . Other Sister     Deceased  . Cancer Sister     Deceased  . Healthy Son     Living  . Heart disease Son     Deceased, 30 from heart valve dysfunction  . Healthy Daughter     No Known Allergies  Current Outpatient Prescriptions on File Prior to Visit  Medication Sig Dispense Refill  . aspirin EC 81 MG  tablet Take 1 tablet (81 mg total) by mouth daily.  90 tablet  1  . atorvastatin (LIPITOR) 80 MG tablet Take 1 tablet (80 mg total) by mouth daily.  30 tablet  3  . clopidogrel (PLAVIX) 75 MG tablet Take 1 tablet (75 mg total) by mouth daily with breakfast.  90 tablet  1  . glucose blood (FREESTYLE LITE) test strip Use to check blood sugar daily  100 each  11  . insulin aspart (NOVOLOG) 100 UNIT/ML injection Inject 6 Units into the skin 3 (three) times daily before meals. NOVOLOG PEN      . insulin glargine (LANTUS) 100 UNIT/ML injection Inject 12 Units into the skin at bedtime. lantus pen  One injection at night 10 units      . Insulin Pen Needle (RELION MINI PEN NEEDLES) 31G X 6 MM MISC Use with insuin injections 4 times daily  200 each  3  . Lancets (FREESTYLE) lancets Use as instructed  100 each  5  . Linaclotide (LINZESS) 145 MCG CAPS capsule Take 1 capsule (145 mcg total) by mouth daily.  30 capsule  6  . metoprolol (LOPRESSOR) 50 MG tablet Take 1 tablet (50  mg total) by mouth 2 (two) times daily.  60 tablet  11  . valsartan (DIOVAN) 80 MG tablet Take 1 tablet (80 mg total) by mouth daily.  30 tablet  3   No current facility-administered medications on file prior to visit.    BP 132/76  Pulse 62  Ht  (1.676 m)  Wt 191 lb 6.4 oz (86.818 kg)  BMI 30.91 kg/m2chart    Objective:   Physical Exam  Constitutional: He is oriented to person, place, and time. He appears well-developed and well-nourished.  HENT:  Right Ear: External ear normal.  Left Ear: External ear normal.  Nose: Nose normal.  Mouth/Throat: Oropharynx is clear and moist.  Neck: Normal range of motion. Neck supple. No thyromegaly present.  Cardiovascular: Normal rate, regular rhythm and normal heart sounds.   Pulmonary/Chest: Effort normal and breath sounds normal.  Abdominal: Soft. Bowel sounds are normal.  Musculoskeletal: Normal range of motion.  Neurological: He is alert and oriented to person, place, and  time.  Skin: Skin is warm and dry.  Psychiatric: He has a normal mood and affect.          Assessment & Plan:  Jaevin was seen today for follow-up.  Diagnoses and associated orders for this visit:  Type II or unspecified type diabetes mellitus without mention of complication, not stated as uncontrolled  Unspecified essential hypertension  DDD (degenerative disc disease), lumbar  Other Orders - traMADol (ULTRAM) 50 MG tablet; Take 1 tablet (50 mg total) by mouth 2 (two) times daily as needed for severe pain.    Call the office with any questions or concerns. Recheck in 4 montsh and sooner as needed.

## 2013-12-19 ENCOUNTER — Telehealth: Payer: Self-pay | Admitting: *Deleted

## 2013-12-19 NOTE — Telephone Encounter (Signed)
Patient's wife called stating that he is having some slurred speech.  Annabelle Harman and I informed her that she has no openings until end of September.  Instructed her to take him to the ER if he is doing that bad.  She said he is not.  She asked if I would call her tomorrow if anything comes open.

## 2013-12-19 NOTE — Telephone Encounter (Signed)
Let's see if we can add him to my schedule on Thursday at 9:30am.  Donika K. Allena Katz, DO

## 2013-12-20 NOTE — Telephone Encounter (Signed)
1 

## 2013-12-20 NOTE — Telephone Encounter (Signed)
Done

## 2013-12-21 ENCOUNTER — Telehealth: Payer: Self-pay | Admitting: Neurology

## 2013-12-21 ENCOUNTER — Telehealth: Payer: Self-pay | Admitting: Family

## 2013-12-21 ENCOUNTER — Ambulatory Visit: Payer: Medicare HMO | Admitting: Neurology

## 2013-12-21 NOTE — Telephone Encounter (Signed)
They would need to go to a academic center to see a neurologist that only sees dementia patients since community neurologists see a mixture of patients.  The closest academic center would be [facility]. Other options would include memory clinic at [facility] or a [facility].  We can place a referral to the center they choose.  Donika K. Allena Katz, DO

## 2013-12-21 NOTE — Telephone Encounter (Signed)
Pt's wife woud like you to call her in the am about 9:30 - 10 am about pt's condition. pt refused to speak w/ a nurse. Only to ms campbell.  She is aware pasonda out until Friday morning.

## 2013-12-21 NOTE — Telephone Encounter (Signed)
Spoke with Mrs. Bevens and she would prefer [facility].  Office note, referral request and demographics faxed to [place] at [phone] for memory assessment.  Mrs. Monforte informed that this may be a geriatric doctor and not a neurologist that her husband will see.

## 2013-12-21 NOTE — Telephone Encounter (Signed)
Pt's spouse called and would like to be referred to a neurologist that handles only dementia patients. Please call pt at the following numbers:  1st attempt [phone] 2nd attempt [phone]

## 2013-12-21 NOTE — Telephone Encounter (Signed)
Any recommendations?

## 2013-12-22 NOTE — Telephone Encounter (Signed)
Left message to advise pt's wife of returned call. Advised also that depending on the day, returned calls aren't made until the end of the day unless they are urgent.

## 2013-12-22 NOTE — Telephone Encounter (Signed)
Pt wife is calling back to waiting on return call

## 2013-12-25 ENCOUNTER — Telehealth: Payer: Self-pay | Admitting: Neurology

## 2013-12-25 NOTE — Telephone Encounter (Signed)
Pt no showed 12/21/13 appt w/ Dr. Allena Katz.   Marlane Hatcher - please send no show letter / Oneita Kras.

## 2013-12-27 ENCOUNTER — Encounter: Payer: Self-pay | Admitting: *Deleted

## 2013-12-27 NOTE — Progress Notes (Signed)
No show letter sent 12/21/2013

## 2014-02-16 ENCOUNTER — Telehealth: Payer: Self-pay | Admitting: Family

## 2014-02-16 NOTE — Telephone Encounter (Signed)
Denied. Requesting refill too soon. Last filled 12/08/13 for 3 month supply. Next fill date 03/10/14

## 2014-02-16 NOTE — Telephone Encounter (Signed)
[facility] [place], [place] - [phone] is requesting re-fill on traMADol (ULTRAM) 50 MG tablet

## 2014-02-20 ENCOUNTER — Ambulatory Visit (INDEPENDENT_AMBULATORY_CARE_PROVIDER_SITE_OTHER): Payer: Medicare HMO | Admitting: Endocrinology

## 2014-02-20 ENCOUNTER — Encounter: Payer: Self-pay | Admitting: Endocrinology

## 2014-02-20 VITALS — BP 130/90 | HR 67 | Temp 98.2°F | Ht 66.0 in | Wt 185.0 lb

## 2014-02-20 DIAGNOSIS — E1059 Type 1 diabetes mellitus with other circulatory complications: Secondary | ICD-10-CM

## 2014-02-20 LAB — HEMOGLOBIN A1C: HEMOGLOBIN A1C: 6.8 % — AB (ref 4.6–6.5)

## 2014-02-20 NOTE — Progress Notes (Signed)
Subjective:    Patient ID: William Aguirre, male    DOB: 09/27/1934, 79 y.o.   MRN: [id number]  HPI  Pt returns for f/u of diabetes mellitus: DM type: 1 Dx'ed: 4132 Complications: CAD Therapy: insulin since soon after dx GDM: never DKA: never Severe hypoglycemia: never Pancreatitis: never Other: he takes multiple daily injections Interval history: no cbg record, but states cbg's are well-controlled.  pt states he feels well in general.  Past Medical History  Diagnosis Date  . Diabetes mellitus without complication   . Hyperlipidemia   . Coronary artery disease     Moderate LAD, Diagonal, Circumflex disease by cath 2004  . Hypertension   . Dementia     Past Surgical History  Procedure Laterality Date  . None      History   Social History  . Marital Status: Married    Spouse Name: N/A    Number of Children: 4  . Years of Education: N/A   Occupational History  . Accountant    Social History Main Topics  . Smoking status: Never Smoker   . Smokeless tobacco: Never Used  . Alcohol Use: No  . Drug Use: No  . Sexual Activity: Not on file   Other Topics Concern  . Not on file   Social History Narrative   Lives alone in a [place] home.   Retired Engineer, maintenance (IT).    Current Outpatient Prescriptions on File Prior to Visit  Medication Sig Dispense Refill  . aspirin EC 81 MG tablet Take 1 tablet (81 mg total) by mouth daily. 90 tablet 1  . atorvastatin (LIPITOR) 80 MG tablet Take 1 tablet (80 mg total) by mouth daily. 30 tablet 3  . clopidogrel (PLAVIX) 75 MG tablet Take 1 tablet (75 mg total) by mouth daily with breakfast. 90 tablet 1  . glucose blood (FREESTYLE LITE) test strip Use to check blood sugar daily 100 each 11  . insulin aspart (NOVOLOG) 100 UNIT/ML injection Inject 6 Units into the skin 3 (three) times daily before meals. NOVOLOG PEN    . insulin glargine (LANTUS) 100 UNIT/ML injection Inject 12 Units into the skin at bedtime. lantus pen  One injection at night  10 units    . Insulin Pen Needle (RELION MINI PEN NEEDLES) 31G X 6 MM MISC Use with insuin injections 4 times daily 200 each 3  . Lancets (FREESTYLE) lancets Use as instructed 100 each 5  . Linaclotide (LINZESS) 145 MCG CAPS capsule Take 1 capsule (145 mcg total) by mouth daily. 30 capsule 6  . metoprolol (LOPRESSOR) 50 MG tablet Take 1 tablet (50 mg total) by mouth 2 (two) times daily. 60 tablet 11  . traMADol (ULTRAM) 50 MG tablet Take 1 tablet (50 mg total) by mouth 2 (two) times daily as needed for severe pain. 180 tablet 0  . valsartan (DIOVAN) 80 MG tablet Take 1 tablet (80 mg total) by mouth daily. 30 tablet 3   No current facility-administered medications on file prior to visit.    No Known Allergies  Family History  Problem Relation Age of Onset  . Other Mother     Deceased, 61  . Other Father     Deceased  . Other Sister     Deceased  . Cancer Sister     Deceased  . Healthy Son     Living  . Heart disease Son     Deceased, 86 from heart valve dysfunction  . Healthy Daughter  BP 130/90 mmHg  Pulse 67  Temp(Src) 98.2 F (36.8 C) (Oral)  Ht 5' 6"  (1.676 m)  Wt 185 lb (83.915 kg)  BMI 29.87 kg/m2  SpO2 98%  Review of Systems He denies hypoglycemia.  He has lost a few lbs.      Objective:   Physical Exam VITAL SIGNS:  See vs page GENERAL: no distress Pulses: dorsalis pedis intact bilat.   Feet: no deformity.  no edema Skin:  no ulcer on the feet.  normal color and temp. Neuro: sensation is intact to touch on the feet   Lab Results  Component Value Date   HGBA1C 6.8* 02/20/2014      Assessment & Plan:  DM: well-controlled.  He does not want to ret in 3 mos, due to copay.   HTN: with situational component.  He can continue the same medications for this for now.   Patient is advised the following: Patient Instructions  check your blood sugar 4 times a NVB:TYOMAY the 3 meals, and at bedtime.  also check if you have symptoms of your blood sugar being  too high or too low.  please keep a record of the readings and bring it to your next appointment here.  You can write it on any piece of paper.  please call us sooner if your blood sugar goes below 70, or if you have a lot of readings over 200.   For now, please continue the same insulins.   Please come back for a follow-up appointment in 6 months.  A diabetes blood test is requested for you today.  We'll contact you with results.   Please let me know if you want to change lantus to "NPH," or change novolog to "regular."  These are much cheaper, if you draw from the bottle.

## 2014-02-20 NOTE — Patient Instructions (Addendum)
check your blood sugar 4 times a day:bZOX:WRUEAVefore the 3 meals, and at bedtime.  also check if you have symptoms of your blood sugar being too high or too low.  please keep a record of the readings and bring it to your next appointment here.  You can write it on any piece of paper.  please call us sooner if your blood sugar goes below 70, or if you have a lot of readings over 200.   For now, please continue the same insulins.   Please come back for a follow-up appointment in 6 months.  A diabetes blood test is requested for you today.  We'll contact you with results.   Please let me know if you want to change lantus to "NPH," or change novolog to "regular."  These are much cheaper, if you draw from the bottle.

## 2014-03-01 ENCOUNTER — Encounter: Payer: Self-pay | Admitting: Cardiovascular Disease

## 2014-03-01 ENCOUNTER — Ambulatory Visit (INDEPENDENT_AMBULATORY_CARE_PROVIDER_SITE_OTHER): Payer: Medicare HMO | Admitting: Cardiovascular Disease

## 2014-03-01 VITALS — BP 142/82 | HR 69 | Ht 66.0 in | Wt 191.4 lb

## 2014-03-01 DIAGNOSIS — E785 Hyperlipidemia, unspecified: Secondary | ICD-10-CM

## 2014-03-01 DIAGNOSIS — I2510 Atherosclerotic heart disease of native coronary artery without angina pectoris: Secondary | ICD-10-CM

## 2014-03-01 DIAGNOSIS — I10 Essential (primary) hypertension: Secondary | ICD-10-CM

## 2014-03-01 NOTE — Patient Instructions (Signed)
Your physician wants you to follow-up in:  6 months. You will receive a reminder letter in the mail two months in advance. If you don't receive a letter, please call our office to schedule the follow-up appointment.   

## 2014-03-01 NOTE — Progress Notes (Signed)
History of Present Illness: 79 yo male with history of CAD, HTN, HLD, DM who is here today for cardiac follow up. He has been followed in the past by Dr. Riley KillStuckey. His last cath was in 2004 and showed moderate LAD, Diagonal, Circumflex disease with diabetic appearance of vessels. He has been managed medically since then. Exercise stress test 05/31/12 and he exercised for 3 minutes and stopped due to fatigue. He and Dr. Riley KillStuckey discussed arranging a stress myoview but the patient declined.  He is here today for follow up. No chest pain or SOB. He is not exercising. Taking all meds.   Primary Care Physician: Birdie SonsBruce Swords  Last Lipid Profile:  Lipid Panel     Component Value Date/Time   CHOL 153 11/20/2013 0925   TRIG 84.0 11/20/2013 0925   HDL 49.40 11/20/2013 0925   CHOLHDL 3 11/20/2013 0925   VLDL 16.8 11/20/2013 0925   LDLCALC 87 11/20/2013 0925     Past Medical History  Diagnosis Date  . Diabetes mellitus without complication   . Hyperlipidemia   . Coronary artery disease     Moderate LAD, Diagonal, Circumflex disease by cath 2004  . Hypertension   . Dementia     Past Surgical History  Procedure Laterality Date  . None      Current Outpatient Prescriptions  Medication Sig Dispense Refill  . aspirin EC 81 MG tablet Take 1 tablet (81 mg total) by mouth daily. 90 tablet 1  . atorvastatin (LIPITOR) 80 MG tablet Take 1 tablet (80 mg total) by mouth daily. 30 tablet 3  . clopidogrel (PLAVIX) 75 MG tablet Take 1 tablet (75 mg total) by mouth daily with breakfast. 90 tablet 1  . glucose blood (FREESTYLE LITE) test strip Use to check blood sugar daily 100 each 11  . insulin aspart (NOVOLOG) 100 UNIT/ML injection Inject 6 Units into the skin 3 (three) times daily before meals. NOVOLOG PEN    . insulin glargine (LANTUS) 100 UNIT/ML injection Inject 12 Units into the skin at bedtime. lantus pen  One injection at night 10 units    . Insulin Pen Needle (RELION MINI PEN NEEDLES) 31G  X 6 MM MISC Use with insuin injections 4 times daily 200 each 3  . Lancets (FREESTYLE) lancets Use as instructed 100 each 5  . metoprolol (LOPRESSOR) 50 MG tablet Take 1 tablet (50 mg total) by mouth 2 (two) times daily. 60 tablet 11  . traMADol (ULTRAM) 50 MG tablet Take 1 tablet (50 mg total) by mouth 2 (two) times daily as needed for severe pain. 180 tablet 0  . valsartan (DIOVAN) 80 MG tablet Take 1 tablet (80 mg total) by mouth daily. 30 tablet 3   No current facility-administered medications for this visit.    No Known Allergies  History   Social History  . Marital Status: Married    Spouse Name: N/A    Number of Children: 4  . Years of Education: N/A   Occupational History  . Accountant    Social History Main Topics  . Smoking status: Never Smoker   . Smokeless tobacco: Never Used  . Alcohol Use: No  . Drug Use: No  . Sexual Activity: Not on file   Other Topics Concern  . Not on file   Social History Narrative   [place]   Retired IT trainerCPA.    Family History  Problem Relation Age of Onset  . Other Mother  Deceased, 34  . Other Father     Deceased  . Other Sister     Deceased  . Cancer Sister     Deceased  . Healthy Son     Living  . Heart disease Son     Deceased, 30 from heart valve dysfunction  . Healthy Daughter     Review of Systems:  As stated in the HPI and otherwise negative.   BP 142/82 mmHg  Pulse 69  Ht 5\' 6"  (1.676 m)  Wt 191 lb 6.4 oz (86.818 kg)  BMI 30.91 kg/m2  SpO2 97%  Physical Examination: General: Well developed, well nourished, NAD HEENT: OP clear, mucus membranes moist SKIN: warm, dry. No rashes. Neuro: No focal deficits Musculoskeletal: Muscle strength 5/5 all ext Psychiatric: Mood and affect normal Neck: No JVD, no carotid bruits, no thyromegaly, no lymphadenopathy. Lungs:Clear bilaterally, no wheezes, rhonci, crackles Cardiovascular: Regular rate and rhythm. No murmurs, gallops or  rubs. Abdomen:Soft. Bowel sounds present. Non-tender.  Extremities: No lower extremity edema. Pulses are 2 + in the bilateral DP/PT.  Cardiac cath December 2004: 1. Ventriculography was performed in the RAO projection. Overall systolic  function was mildly reduced. Ejection fraction was calculated at 47% but  appeared to be visually better. There was hypokinesis of the inferobasal  segment. No significant mitral regurgitation was noted.  2. The left main coronary artery was free of critical disease.  3. The left anterior descending artery coursed to the apex. The LAD and its  branch had a fairly typical diabetic appearance with multiple areas of  luminal irregularity. Beyond the origin of the major diagonal branch was  a 40-50% area of focal stenosis that was slightly hypodense that appeared  to be less severe in some views than others. There was also a 30-40%  ostial narrowing of the major diagonal branch. There was moderate  plaquing noted at the apical portion of the LAD as well; critical  stenoses, however, were not noted.  4. The circumflex is a dominant vessel. There is a first marginal branch  that has a typical diabetic appearance and a focal area of about 60%  narrowing. There is also a second marginal branch that bifurcates  distally and again has diffuse luminal irregularities compatible with  diabetes. The A-V circumflex courses to the posterior wall, where it  supplies two posterolateral branches and then is totally occluded,  functioning as a posterior descending branch. In the initial RAO films,  there was evidence of some collateralization of the distal PDA, and this  had an appearance of being somewhat diffusely irregular.  5. The right coronary artery was a nondominant vessel that was free of  critical disease.  CONCLUSIONS:  1. Mild reduction in left ventricular function with an inferobasal wall  motion abnormality but with hypokinesis and not akinesis.  2.  Currently normal electrocardiogram.  3. Total occlusion of the distal circumflex prior to the posterior  descending branch with early collateralization of this vessel.  4. Moderate disease of the left anterior descending and diagonal system.  5. Typical diabetic appearance of the coronary arteries.  Echo 03/15/13: Left ventricle: The cavity size was normal. Wall thickness was normal. Systolic function was vigorous. The estimated ejection fraction was in the range of 65% to 70%. Wall motion was normal; there were no regional wall motion abnormalities. Doppler parameters are consistent with abnormal left ventricular relaxation (grade 1 diastolic dysfunction).   Assessment and Plan:   1. CAD: No recent angina. No changes  in therapy. Continue ASA/Plavix/statin/beta blocker. He will call with a change in his symptoms. LVEF normal on echo 03/15/13.  2. Hyperlipidemia: He is on a statin. LDL 87. Did not wish to stay on zetia due to cost.   3. HTN: BP borderline elevated today. Will check at home this week. No changes today.   4. Diabetes mellitus: Managed in primary care. Reports good control of blood sugars.

## 2014-03-02 ENCOUNTER — Telehealth: Payer: Self-pay | Admitting: Endocrinology

## 2014-03-02 NOTE — Telephone Encounter (Signed)
Patient is calling with the results of his lab work

## 2014-03-02 NOTE — Telephone Encounter (Signed)
Pt advised that A1C was 6.8 and to continue the same medications. Pt voiced understanding.

## 2014-03-05 ENCOUNTER — Telehealth: Payer: Self-pay | Admitting: Family

## 2014-03-05 ENCOUNTER — Ambulatory Visit (INDEPENDENT_AMBULATORY_CARE_PROVIDER_SITE_OTHER): Payer: Commercial Managed Care - HMO | Admitting: Neurology

## 2014-03-05 ENCOUNTER — Encounter: Payer: Self-pay | Admitting: Neurology

## 2014-03-05 VITALS — BP 120/70 | HR 71 | Ht 68.0 in | Wt 192.2 lb

## 2014-03-05 DIAGNOSIS — G3184 Mild cognitive impairment, so stated: Secondary | ICD-10-CM

## 2014-03-05 DIAGNOSIS — M4716 Other spondylosis with myelopathy, lumbar region: Secondary | ICD-10-CM

## 2014-03-05 DIAGNOSIS — E1340 Other specified diabetes mellitus with diabetic neuropathy, unspecified: Secondary | ICD-10-CM

## 2014-03-05 DIAGNOSIS — R269 Unspecified abnormalities of gait and mobility: Secondary | ICD-10-CM

## 2014-03-05 NOTE — Telephone Encounter (Signed)
[facility] needs [facility] referral . This is for a follow up appt, but the last time no referral was needed. Debby Budndre will cb later this afternoon  Fax: [phone]

## 2014-03-05 NOTE — Progress Notes (Signed)
Follow-up Visit   Date: 03/05/2014    William Aguirre MRN: [id number] DOB: May 28, 1934   Interim History: William Aguirre is a 79 y.o. right-handed African American male with history of diabetes mellitus on insulin (HbA1c 6.8), hyperlipidemia, CAD, and hypertension returning to the clinic for follow-up of gait imbalance and memory impairment.  The patient was accompanied to the clinic by wife who also provides collateral information.    History of present illness: Patient reports having three falls in the past, (1) in 2013 slipped on ice and (2) [place], [place] cash registers to get snicker bars and stumbled over a pumpkin display and (3) in February, he had parked his car at a [employer] and his feet buckled. He says the floor was wet. He was unable to get up from his fall so EMS was called. He went to the hospital where XR lumbar spine showed lumbar spondylosis. He denies weakness, numbness/tingling, bowel/bladder incontinence. He has been using a cane since 2013. Due to persistent back pain, he had MRI lumbar spine in February which showed compression fracture at L1 (new) and T12 (old) in additional to moderate impingement of L4-5 bilaterally and mild impingement of T11-12 and T12-L1.  Upon review of Epic chart, he has had problems with confusion also, although he denies this any there is no one to collaborate history. It appears that he has had several month history of cognitive decline.   UPDATE 03/05/2014:  His diabetes has been under much better control by endocrinology since his last visit.  He saw his orthopeadic doctor for his left foot drop who performed EMG (no results available) and recommended using left AFO.  His wife reports that he is doing well from a memory standpoint and does not feel it is a huge problem.  He continues to drive and manage his own finances without difficulty.  She does not have any safety concerns with  him driving.  He has no new neurological complaints.   Medications:  Current Outpatient Prescriptions on File Prior to Visit  Medication Sig Dispense Refill  . aspirin EC 81 MG tablet Take 1 tablet (81 mg total) by mouth daily. 90 tablet 1  . atorvastatin (LIPITOR) 80 MG tablet Take 1 tablet (80 mg total) by mouth daily. 30 tablet 3  . clopidogrel (PLAVIX) 75 MG tablet Take 1 tablet (75 mg total) by mouth daily with breakfast. 90 tablet 1  . glucose blood (FREESTYLE LITE) test strip Use to check blood sugar daily 100 each 11  . insulin aspart (NOVOLOG) 100 UNIT/ML injection Inject 6 Units into the skin 3 (three) times daily before meals. NOVOLOG PEN    . insulin glargine (LANTUS) 100 UNIT/ML injection Inject 12 Units into the skin at bedtime. lantus pen  One injection at night 10 units    . Insulin Pen Needle (RELION MINI PEN NEEDLES) 31G X 6 MM MISC Use with insuin injections 4 times daily 200 each 3  . Lancets (FREESTYLE) lancets Use as instructed 100 each 5  . metoprolol (LOPRESSOR) 50 MG tablet Take 1 tablet (50 mg total) by mouth 2 (two) times daily. 60 tablet 11  . traMADol (ULTRAM) 50 MG tablet Take 1 tablet (50 mg total) by mouth 2 (two) times daily as needed for severe pain. 180 tablet 0  . valsartan (DIOVAN) 80 MG tablet Take 1 tablet (80 mg total) by mouth daily. 30 tablet 3   No  current facility-administered medications on file prior to visit.    Allergies: No Known Allergies   Review of Systems:  CONSTITUTIONAL: No fevers, chills, night sweats, or weight loss.   EYES: No visual changes or eye pain ENT: No hearing changes.  No history of nose bleeds.   RESPIRATORY: No cough, wheezing and shortness of breath.   CARDIOVASCULAR: Negative for chest pain, and palpitations.   GI: Negative for abdominal discomfort, blood in stools or black stools.  No recent change in bowel habits.   GU:  No history of incontinence.   MUSCLOSKELETAL: No history of joint pain or swelling.  No  myalgias.   SKIN: Negative for lesions, rash, and itching.   ENDOCRINE: Negative for cold or heat intolerance, polydipsia or goiter.   PSYCH:  No depression or anxiety symptoms.   NEURO: As Above.   Vital Signs:  BP 120/70 mmHg  Pulse 71  Ht 5\' 8"  (1.727 m)  Wt 192 lb 4 oz (87.204 kg)  BMI 29.24 kg/m2  SpO2 98%  Neurological Exam: MENTAL STATUS:  Montreal Cognitive Assessment  03/05/2014  Visuospatial/ Executive (0/5) 4  Naming (0/3) 3  Attention: Read list of digits (0/2) 2  Attention: Read list of letters (0/1) 1  Attention: Serial 7 subtraction starting at 100 (0/3) 3  Language: Repeat phrase (0/2) 2  Language : Fluency (0/1) 0  Abstraction (0/2) 1  Delayed Recall (0/5) 2  Orientation (0/6) 6  Total 24  Adjusted Score (based on education) 24   CRANIAL NERVES:  Pupils equal round and reactive to light.  Normal conjugate, extra-ocular eye movements in all directions of gaze.  No ptosis.  Face is symmetric.   MOTOR:  Motor strength is 5/5 in all extremities, except toe extensors and toe flexors are 5-/5 bilaterally. Left foot inversion and eversion is 5/5.  No pronator drift.  Tone is normal.    MSRs:  Right                                                                 Left brachioradialis 2+  brachioradialis 2+  biceps 2+  biceps 2+  triceps 2+  triceps 2+  patellar 3+  patellar 3+  ankle jerk 0  ankle jerk 1+  Hoffman no  Hoffman no  plantar response down  plantar response down   SENSORY: Absent vibration distal to ankles and impaired temperature in feet bilaterally. Romberg's sign present  COORDINATION/GAIT: Stooped posture, with mildly wide-based gait and unsteady.  He is able to stand on toes, but unable to stand on heels.     Data: MRI brain wo contrast 06/30/2013: Brain and cord atrophy. Chronic small-vessel changes of the cerebral hemispheric white matter. No acute or reversible process.  MRI lumbar spine 05/22/2013: 1. Acute 25% superior endplate  compression fracture at L1 without significant bony retropulsion.  2. Lumbar spondylosis and degenerative disc disease, causing moderate impingement at L4-5 and mild impingement at T11-12 and T12-L1 as detailed above.  3. Borderline low conus position of the mid L2 level. There is a fatty filum terminal measuring up to 2.5 mm in diameter.  4. Remote 25% superior endplate compression fracture at T12.  5. T2 hyperintense lesion of the right kidney lower pole is probably a cyst but  technically nonspecific on today's noncontrast lumbar spine exam. If the patient has hematuria this may warrant workup by dedicated renal protocol MRI with and without contrast.    Lab Results  Component Value Date   HGBA1C 6.8* 02/20/2014   Lab Results  Component Value Date   VITAMINB12 435 05/08/2013   Lab Results  Component Value Date   TSH 1.62 05/08/2013    IMPRESSION/PLAN: 1.  Mild cognitive impairment (MoCA 24/30)  - Problems with short-term memory, but denies having difficulty with IADLs or ADLs  - MRI brain with generalized atrophy  - Aricept offered, but patient declined  - Encouraged to stay active and engage in mentally stimulating activities (puzzles, Sodoku, etc)  - Ok to drive at this time, but if there is worsening visuospatial deficits or new complaints of getting lost, will need to readdress  - Patient scheduled for D[facility] Clinic at [facility] in 2016 and prefers to have neuropsych testing/labs per their recomendations  2.  Neuropathy due to diabetes  - much better controlled  - Manifesting with distal leg weakness and gait imbalance  - Continue to use cane   3.  Left foot drop, likely due to lumbosacral L5 radiculopathy and neuropathy  - Request EMG from [facility]   Return to clinic as needed   The duration of this appointment visit was 35 minutes of face-to-face time with the patient.  Greater than 50% of this time was spent in counseling, explanation of diagnosis, planning  of further management, and coordination of care.   Thank you for allowing me to participate in patient's care.  If I can answer any additional questions, I would be pleased to do so.    Sincerely,    Donika K. Allena Katz, DO

## 2014-03-05 NOTE — Patient Instructions (Signed)
Continue to do exercises for your feet Recommend mind stimulating activities such as puzzles, crosswords, Sodoku, etc. Keep up the good work managing your diabetes! Return to clinic as needed

## 2014-03-05 NOTE — Telephone Encounter (Signed)
[facility] [place], [place] - [phone] is requesting re-fills on valsartan (DIOVAN) 80 MG tablet and traMADol (ULTRAM) 50 MG tablet

## 2014-03-05 NOTE — Telephone Encounter (Signed)
authorization  [id number]  03/05/2014 - 09/01/2014  [facility]  for pt to see Dr patel

## 2014-03-06 MED ORDER — VALSARTAN 80 MG PO TABS: 80.0000 mg | ORAL_TABLET | Freq: Every day | ORAL | Status: DC

## 2014-03-06 MED ORDER — TRAMADOL HCL 50 MG PO TABS: 50.0000 mg | ORAL_TABLET | Freq: Two times a day (BID) | ORAL | Status: DC | PRN

## 2014-03-06 NOTE — Telephone Encounter (Signed)
Done

## 2014-03-07 ENCOUNTER — Other Ambulatory Visit: Payer: Self-pay | Admitting: *Deleted

## 2014-03-07 MED ORDER — METOPROLOL TARTRATE 50 MG PO TABS: 50.0000 mg | ORAL_TABLET | Freq: Two times a day (BID) | ORAL | Status: DC

## 2014-03-07 MED ORDER — ATORVASTATIN CALCIUM 80 MG PO TABS: 80.0000 mg | ORAL_TABLET | Freq: Every day | ORAL | Status: DC

## 2014-03-15 ENCOUNTER — Other Ambulatory Visit: Payer: Self-pay

## 2014-03-15 MED ORDER — ATORVASTATIN CALCIUM 80 MG PO TABS: 80.0000 mg | ORAL_TABLET | Freq: Every day | ORAL | Status: DC

## 2014-03-16 ENCOUNTER — Other Ambulatory Visit: Payer: Self-pay

## 2014-03-16 MED ORDER — METOPROLOL TARTRATE 50 MG PO TABS: 50.0000 mg | ORAL_TABLET | Freq: Two times a day (BID) | ORAL | Status: DC

## 2014-03-16 MED ORDER — ATORVASTATIN CALCIUM 80 MG PO TABS: 80.0000 mg | ORAL_TABLET | Freq: Every day | ORAL | Status: DC

## 2014-03-21 ENCOUNTER — Telehealth: Payer: Self-pay | Admitting: Family

## 2014-03-21 MED ORDER — INSULIN PEN NEEDLE 31G X 6 MM MISC: Status: DC

## 2014-03-21 NOTE — Telephone Encounter (Signed)
Done

## 2014-03-21 NOTE — Telephone Encounter (Signed)
[facility] [place], [place] - [phone] [phone] is requesting re-fill on Insulin Pen Needle (RELION MINI PEN NEEDLES) 31G X 6 MM MISC

## 2014-03-22 ENCOUNTER — Telehealth: Payer: Self-pay | Admitting: Family

## 2014-03-22 MED ORDER — INSULIN PEN NEEDLE 31G X 6 MM MISC: Status: DC

## 2014-03-22 NOTE — Telephone Encounter (Signed)
[facility] request pt's instructions for Insulin Pen Needle (RELION MINI PEN NEEDLES) 31G X 6 MM MISC to be changed to 3 X /day.  If you can update in system, she states you will not have to call. Pt is only using 3 x/day per novolog instructions.

## 2014-03-22 NOTE — Telephone Encounter (Signed)
done

## 2014-04-09 ENCOUNTER — Ambulatory Visit: Payer: Medicare HMO | Admitting: Family

## 2014-04-10 ENCOUNTER — Ambulatory Visit (INDEPENDENT_AMBULATORY_CARE_PROVIDER_SITE_OTHER): Payer: Commercial Managed Care - HMO | Admitting: Family

## 2014-04-10 ENCOUNTER — Encounter: Payer: Self-pay | Admitting: Family

## 2014-04-10 VITALS — BP 122/78 | HR 72 | Temp 98.1°F | Ht 68.0 in | Wt 189.8 lb

## 2014-04-10 DIAGNOSIS — E785 Hyperlipidemia, unspecified: Secondary | ICD-10-CM

## 2014-04-10 DIAGNOSIS — I10 Essential (primary) hypertension: Secondary | ICD-10-CM

## 2014-04-10 DIAGNOSIS — E119 Type 2 diabetes mellitus without complications: Secondary | ICD-10-CM

## 2014-04-10 LAB — HEPATIC FUNCTION PANEL
ALBUMIN: 3.8 g/dL (ref 3.5–5.2)
ALK PHOS: 94 U/L (ref 39–117)
ALT: 16 U/L (ref 0–53)
AST: 20 U/L (ref 0–37)
Bilirubin, Direct: 0 mg/dL (ref 0.0–0.3)
TOTAL PROTEIN: 7.3 g/dL (ref 6.0–8.3)
Total Bilirubin: 0.3 mg/dL (ref 0.2–1.2)

## 2014-04-10 LAB — BASIC METABOLIC PANEL
BUN: 12 mg/dL (ref 6–23)
CHLORIDE: 105 meq/L (ref 96–112)
CO2: 29 meq/L (ref 19–32)
CREATININE: 1.2 mg/dL (ref 0.4–1.5)
Calcium: 9.5 mg/dL (ref 8.4–10.5)
GFR: 76.83 mL/min (ref >60.00)
Glucose, Bld: 87 mg/dL (ref 70–99)
Potassium: 4.3 mEq/L (ref 3.5–5.1)
Sodium: 140 mEq/L (ref 135–145)

## 2014-04-10 NOTE — Progress Notes (Signed)
Subjective:    Patient ID: William Aguirre, male    DOB: [birth year], 79 y.o.   MRN: [id number]  HPI 79 year old African-American male, nonsmoker is in today for four-month recheck of type 2 diabetes, hyperlipidemia, hypertension. He's currently under the care of endocrinology. Last hemoglobin A1c was 6.8. Tolerates medications well. Doesn't routinely exercise. Diabetic eye exam is up-to-date. Does nightly feet checks.   Review of Systems  Constitutional: Negative.   HENT: Negative.   Respiratory: Negative.   Cardiovascular: Negative.   Gastrointestinal: Negative.   Endocrine: Negative.   Genitourinary: Negative.   Musculoskeletal: Negative.   Skin: Negative.   Allergic/Immunologic: Negative.   Neurological: Negative.   Hematological: Negative.   Psychiatric/Behavioral: Negative.    Past Medical History  Diagnosis Date  . Diabetes mellitus without complication   . Hyperlipidemia   . Coronary artery disease     Moderate LAD, Diagonal, Circumflex disease by cath 2004  . Hypertension   . Dementia     History   Social History  . Marital Status: Married    Spouse Name: N/A    Number of Children: 4  . Years of Education: N/A   Occupational History  . Accountant    Social History Main Topics  . Smoking status: Never Smoker   . Smokeless tobacco: Never Used  . Alcohol Use: No  . Drug Use: No  . Sexual Activity: Not on file   Other Topics Concern  . Not on file   Social History Narrative   Lives with wife in a one story home.   Retired IT trainerCPA.          Past Surgical History  Procedure Laterality Date  . None      Family History  Problem Relation Age of Onset  . Other Mother     Deceased, 3579  . Other Father     Deceased  . Other Sister     Deceased  . Cancer Sister     Deceased  . Healthy Son     Living  . Heart disease Son     Deceased, 30 from heart valve dysfunction  . Healthy Daughter     No Known Allergies  Current Outpatient Prescriptions  on File Prior to Visit  Medication Sig Dispense Refill  . aspirin EC 81 MG tablet Take 1 tablet (81 mg total) by mouth daily. 90 tablet 1  . atorvastatin (LIPITOR) 80 MG tablet Take 1 tablet (80 mg total) by mouth daily. 30 tablet 3  . clopidogrel (PLAVIX) 75 MG tablet Take 1 tablet (75 mg total) by mouth daily with breakfast. 90 tablet 1  . glucose blood (FREESTYLE LITE) test strip Use to check blood sugar daily 100 each 11  . insulin aspart (NOVOLOG) 100 UNIT/ML injection Inject 6 Units into the skin 3 (three) times daily before meals. NOVOLOG PEN    . insulin glargine (LANTUS) 100 UNIT/ML injection Inject 12 Units into the skin at bedtime. lantus pen  One injection at night 10 units    . Insulin Pen Needle (RELION MINI PEN NEEDLES) 31G X 6 MM MISC Use with insuin injections 3 times daily 200 each 3  . Lancets (FREESTYLE) lancets Use as instructed 100 each 5  . metoprolol (LOPRESSOR) 50 MG tablet Take 1 tablet (50 mg total) by mouth 2 (two) times daily. 60 tablet 3  . traMADol (ULTRAM) 50 MG tablet Take 1 tablet (50 mg total) by mouth 2 (two) times daily as needed for  severe pain. 180 tablet 0  . valsartan (DIOVAN) 80 MG tablet Take 1 tablet (80 mg total) by mouth daily. 30 tablet 3   No current facility-administered medications on file prior to visit.    BP 122/78 mmHg  Pulse 72  Temp(Src) 98.1 F (36.7 C) (Oral)  Ht 5\' 8"  (1.727 m)  Wt 189 lb 12.8 oz (86.093 kg)  BMI 28.87 kg/m2chart    Objective:   Physical Exam  Constitutional: He is oriented to person, place, and time. He appears well-developed and well-nourished.  HENT:  Right Ear: External ear normal.  Left Ear: External ear normal.  Nose: Nose normal.  Mouth/Throat: Oropharynx is clear and moist.  Neck: Normal range of motion. Neck supple. No thyromegaly present.  Cardiovascular: Normal rate, regular rhythm and normal heart sounds.   Pulmonary/Chest: Effort normal and breath sounds normal.  Abdominal: Soft. Bowel sounds  are normal.  Musculoskeletal: Normal range of motion.  Neurological: He is alert and oriented to person, place, and time.  Skin: Skin is warm and dry.  Psychiatric: He has a normal mood and affect.          Assessment & Plan:  Sherilyn CooterHenry was seen today for follow-up.  Diagnoses and associated orders for this visit:  Type 2 diabetes mellitus without complication - Basic Metabolic Panel - Hepatic Function Panel  Essential hypertension - Basic Metabolic Panel - Hepatic Function Panel  Hyperlipidemia - Basic Metabolic Panel - Hepatic Function Panel    Continue current medications. Obtain labs today. Follow-up here in 6 months. Continue seeing endocrinology and other specialists as scheduled.

## 2014-04-10 NOTE — Progress Notes (Signed)
Pre visit review using our clinic review tool, if applicable. No additional management support is needed unless otherwise documented below in the visit note. 

## 2014-04-10 NOTE — Patient Instructions (Signed)
Diabetes and Standards of Medical Care Diabetes is complicated. You may find that your diabetes team includes a dietitian, nurse, diabetes educator, eye doctor, and more. To help everyone know what is going on and to help you get the care you deserve, the following schedule of care was developed to help keep you on track. Below are the tests, exams, vaccines, medicines, education, and plans you will need. HbA1c test This test shows how well you have controlled your glucose over the past 2-3 months. It is used to see if your diabetes management plan needs to be adjusted.   It is performed at least 2 times a year if you are meeting treatment goals.  It is performed 4 times a year if therapy has changed or if you are not meeting treatment goals. Blood pressure test  This test is performed at every routine medical visit. The goal is less than 140/90 mm Hg for most people, but 130/80 mm Hg in some cases. Ask your health care provider about your goal. Dental exam  Follow up with the dentist regularly. Eye exam  If you are diagnosed with type 1 diabetes as a child, get an exam upon reaching the age of 37 years or older and have had diabetes for 3-5 years. Yearly eye exams are recommended after that initial eye exam.  If you are diagnosed with type 1 diabetes as an adult, get an exam within 5 years of diagnosis and then yearly.  If you are diagnosed with type 2 diabetes, get an exam as soon as possible after the diagnosis and then yearly. Foot care exam  Visual foot exams are performed at every routine medical visit. The exams check for cuts, injuries, or other problems with the feet.  A comprehensive foot exam should be done yearly. This includes visual inspection as well as assessing foot pulses and testing for loss of sensation.  Check your feet nightly for cuts, injuries, or other problems with your feet. Tell your health care provider if anything is not healing. Kidney function test (urine  microalbumin)  This test is performed once a year.  Type 1 diabetes: The first test is performed 5 years after diagnosis.  Type 2 diabetes: The first test is performed at the time of diagnosis.  A serum creatinine and estimated glomerular filtration rate (eGFR) test is done once a year to assess the level of chronic kidney disease (CKD), if present. Lipid profile (cholesterol, HDL, LDL, triglycerides)  Performed every 5 years for most people.  The goal for LDL is less than 100 mg/dL. If you are at high risk, the goal is less than 70 mg/dL.  The goal for HDL is 40 mg/dL-50 mg/dL for men and 50 mg/dL-60 mg/dL for women. An HDL cholesterol of 60 mg/dL or higher gives some protection against heart disease.  The goal for triglycerides is less than 150 mg/dL. Influenza vaccine, pneumococcal vaccine, and hepatitis B vaccine  The influenza vaccine is recommended yearly.  It is recommended that people with diabetes who are over 24 years old get the pneumonia vaccine. In some cases, two separate shots may be given. Ask your health care provider if your pneumonia vaccination is up to date.  The hepatitis B vaccine is also recommended for adults with diabetes. Diabetes self-management education  Education is recommended at diagnosis and ongoing as needed. Treatment plan  Your treatment plan is reviewed at every medical visit. Document Released: 01/25/2009 Document Revised: 08/14/2013 Document Reviewed: 08/30/2012 [facility] Patient Information 2015 [place],  LLC. This information is not intended to replace advice given to you by your health care provider. Make sure you discuss any questions you have with your health care provider.  

## 2014-04-17 ENCOUNTER — Other Ambulatory Visit: Payer: Self-pay

## 2014-04-17 ENCOUNTER — Other Ambulatory Visit: Payer: Self-pay | Admitting: *Deleted

## 2014-04-17 NOTE — Telephone Encounter (Signed)
Refill request too soon. Last filled 03/06/14. Refill due 06/06/14

## 2014-04-18 ENCOUNTER — Other Ambulatory Visit: Payer: Self-pay | Admitting: *Deleted

## 2014-04-18 MED ORDER — CLOPIDOGREL BISULFATE 75 MG PO TABS
75.0000 mg | ORAL_TABLET | Freq: Every day | ORAL | Status: DC
Start: 2014-04-18 — End: 2014-10-22

## 2014-04-19 ENCOUNTER — Other Ambulatory Visit: Payer: Self-pay | Admitting: Family

## 2014-04-19 NOTE — Telephone Encounter (Signed)
Diabetes managed by Dr. Everardo AllEllison

## 2014-04-19 NOTE — Telephone Encounter (Signed)
pharm request refill insulin glargine (LANTUS) 100 UNIT/ML injection Pt has only one day left

## 2014-04-20 ENCOUNTER — Other Ambulatory Visit: Payer: Self-pay | Admitting: *Deleted

## 2014-04-20 MED ORDER — ASPIRIN EC 81 MG PO TBEC: 81.0000 mg | DELAYED_RELEASE_TABLET | Freq: Every day | ORAL | Status: DC

## 2014-04-20 MED ORDER — INSULIN GLARGINE 100 UNIT/ML ~~LOC~~ SOLN: 12.0000 [IU] | Freq: Every day | SUBCUTANEOUS | Status: DC

## 2014-04-25 ENCOUNTER — Telehealth: Payer: Self-pay | Admitting: Endocrinology

## 2014-04-25 ENCOUNTER — Other Ambulatory Visit: Payer: Self-pay

## 2014-04-25 MED ORDER — INSULIN GLARGINE 100 UNIT/ML ~~LOC~~ SOLN: 12.0000 [IU] | Freq: Every day | SUBCUTANEOUS | Status: DC

## 2014-04-25 NOTE — Telephone Encounter (Signed)
Rx sent to pharmacy   

## 2014-04-25 NOTE — Telephone Encounter (Signed)
Patient need refill of the Lantus solostar.

## 2014-04-27 ENCOUNTER — Other Ambulatory Visit: Payer: Self-pay

## 2014-04-27 ENCOUNTER — Telehealth: Payer: Self-pay | Admitting: Endocrinology

## 2014-04-27 MED ORDER — INSULIN GLARGINE 100 UNIT/ML ~~LOC~~ SOLN: 12.0000 [IU] | Freq: Every day | SUBCUTANEOUS | Status: DC

## 2014-04-27 MED ORDER — INSULIN PEN NEEDLE 31G X 6 MM MISC: Status: DC

## 2014-04-27 NOTE — Telephone Encounter (Signed)
Rx sent 

## 2014-04-27 NOTE — Telephone Encounter (Signed)
Patient stated that his lantus was sent to the wrong pharmacy, sent it to,   [facility] - [place], [place] - [address] [phone] (Phone) [phone] (Fax)   And patient stated that he also need 1 box of pen needles 6 ml 31 gauge.

## 2014-05-14 ENCOUNTER — Telehealth: Payer: Self-pay | Admitting: Family

## 2014-05-14 NOTE — Telephone Encounter (Signed)
Spoke with pt wife to explain to her that the patient has to sign a medical release to have those records from our [facility] .  Spoke with andrea in Dr patel office as well

## 2014-05-14 NOTE — Telephone Encounter (Signed)
Wife called to say dr patel's office needs pt's last OV notes from [facility].  Pt has an appt tomorrow, 2/2 in [place]. Fax  [phone]

## 2014-05-15 DIAGNOSIS — E1140 Type 2 diabetes mellitus with diabetic neuropathy, unspecified: Secondary | ICD-10-CM | POA: Diagnosis not present

## 2014-05-15 DIAGNOSIS — F068 Other specified mental disorders due to known physiological condition: Secondary | ICD-10-CM | POA: Diagnosis not present

## 2014-05-18 ENCOUNTER — Telehealth: Payer: Self-pay

## 2014-05-18 NOTE — Telephone Encounter (Signed)
Refill request too soon. Not due until 06/06/14

## 2014-05-18 NOTE — Telephone Encounter (Signed)
Rx request for Tramadol HCL 50 mg tablet-Take 1 tablet by mouth twice daily as needed for severe pain   Pharm:  [facility] [place]  Pls advise.

## 2014-05-30 ENCOUNTER — Other Ambulatory Visit: Payer: Self-pay

## 2014-05-30 MED ORDER — TRAMADOL HCL 50 MG PO TABS: 50.0000 mg | ORAL_TABLET | Freq: Two times a day (BID) | ORAL | Status: DC | PRN

## 2014-07-10 ENCOUNTER — Telehealth: Payer: Self-pay | Admitting: Family Medicine

## 2014-07-10 NOTE — Telephone Encounter (Signed)
Refill denied. Request too soon. Refill was done 05/30/14 for #180 which should a 3 month supply if taken bid qd

## 2014-07-10 NOTE — Telephone Encounter (Signed)
Refill request for Tramadol 50 mg take 1 po bid prn [facility] [place]

## 2014-07-12 ENCOUNTER — Other Ambulatory Visit: Payer: Self-pay | Admitting: Family

## 2014-07-13 ENCOUNTER — Other Ambulatory Visit: Payer: Self-pay

## 2014-07-13 MED ORDER — METOPROLOL TARTRATE 50 MG PO TABS: 50.0000 mg | ORAL_TABLET | Freq: Two times a day (BID) | ORAL | Status: DC

## 2014-08-02 ENCOUNTER — Ambulatory Visit (INDEPENDENT_AMBULATORY_CARE_PROVIDER_SITE_OTHER): Payer: Commercial Managed Care - HMO | Admitting: Adult Health

## 2014-08-02 ENCOUNTER — Encounter: Payer: Self-pay | Admitting: Adult Health

## 2014-08-02 VITALS — BP 162/84 | Temp 97.3°F | Ht 66.0 in | Wt 196.3 lb

## 2014-08-02 DIAGNOSIS — Z7189 Other specified counseling: Secondary | ICD-10-CM | POA: Diagnosis not present

## 2014-08-02 DIAGNOSIS — E119 Type 2 diabetes mellitus without complications: Secondary | ICD-10-CM | POA: Diagnosis not present

## 2014-08-02 DIAGNOSIS — Z7689 Persons encountering health services in other specified circumstances: Secondary | ICD-10-CM

## 2014-08-02 DIAGNOSIS — I10 Essential (primary) hypertension: Secondary | ICD-10-CM | POA: Diagnosis not present

## 2014-08-02 NOTE — Progress Notes (Signed)
HPI:  William Aguirre is here to establish care.  Last PCP and physical: December 2015 with [facility] Exam: November 2015.  Dentist: 2015 Foot exam: every night at home  Diet: Diabetic diet- usually Exercise:None     Has the following chronic problems that require follow up and concerns today:  Diabetes - Takes Novalog 6 units each times TID. Lantus, 2 units at night.  - Followed by endocrine. Has an appointment with them in 2 days at which times they will draw the newest A1c. - Currently well controlled. Last A1c 6.8  HTN - Blood pressure well controlled on current regimen.     Wt Readings from Last 3 Encounters:  08/02/14 196 lb 4.8 oz (89.041 kg)  04/10/14 189 lb 12.8 oz (86.093 kg)  03/05/14 192 lb 4 oz (87.204 kg)      ROS negative for unless reported above: fevers, unintentional weight loss, hearing or vision loss, chest pain, palpitations, struggling to breath, hemoptysis, melena, hematochezia, hematuria, falls, loc, si, thoughts of self harm  Past Medical History  Diagnosis Date  . Diabetes mellitus without complication   . Hyperlipidemia   . Coronary artery disease     Moderate LAD, Diagonal, Circumflex disease by cath 2004  . Hypertension   . Dementia     Past Surgical History  Procedure Laterality Date  . None      Family History  Problem Relation Age of Onset  . Other Mother     Deceased, 76  . Other Father     Deceased  . Other Sister     Deceased  . Cancer Sister     Deceased  . Healthy Son     Living  . Heart disease Son     Deceased, 30 from heart valve dysfunction  . Healthy Daughter     History   Social History  . Marital Status: Married    Spouse Name: N/A  . Number of Children: 4  . Years of Education: N/A   Occupational History  . Accountant    Social History Main Topics  . Smoking status: Never Smoker   . Smokeless tobacco: Never Used  . Alcohol Use: No  . Drug Use: No  . Sexual Activity: Not on file    Other Topics Concern  . None   Social History Narrative   Lives with wife in a one story home.   Retired IT trainer.           Current outpatient prescriptions:  .  aspirin EC 81 MG tablet, Take 1 tablet (81 mg total) by mouth daily., Disp: 90 tablet, Rfl: 1 .  atorvastatin (LIPITOR) 80 MG tablet, Take 1 tablet (80 mg total) by mouth daily., Disp: 30 tablet, Rfl: 3 .  clopidogrel (PLAVIX) 75 MG tablet, Take 1 tablet (75 mg total) by mouth daily with breakfast., Disp: 90 tablet, Rfl: 1 .  glucose blood (FREESTYLE LITE) test strip, Use to check blood sugar daily, Disp: 100 each, Rfl: 11 .  insulin aspart (NOVOLOG) 100 UNIT/ML injection, Inject 6 Units into the skin 3 (three) times daily before meals. NOVOLOG PEN, Disp: , Rfl:  .  Insulin Glargine (LANTUS) 100 UNIT/ML Solostar Pen, Inject 12 Units into the skin daily at 10 pm., Disp: , Rfl:  .  Insulin Pen Needle (RELION MINI PEN NEEDLES) 31G X 6 MM MISC, Use with insuin injections 3 times daily (Patient taking differently: Use with insuin injections 4 times daily), Disp: 200 each, Rfl: 3 .  Lancets (FREESTYLE) lancets, Use as instructed, Disp: 100 each, Rfl: 5 .  metoprolol (LOPRESSOR) 50 MG tablet, Take 1 tablet (50 mg total) by mouth 2 (two) times daily., Disp: 60 tablet, Rfl: 3 .  traMADol (ULTRAM) 50 MG tablet, Take 1 tablet (50 mg total) by mouth 2 (two) times daily as needed for severe pain., Disp: 180 tablet, Rfl: 0 .  valsartan (DIOVAN) 80 MG tablet, TAKE 1 TABLET BY MOUTH ONCE DAILY, Disp: 90 tablet, Rfl: 1  EXAM:  Filed Vitals:   08/02/14 1408  BP: 162/84  Temp: 97.3 F (36.3 C)    Body mass index is 31.7 kg/(m^2).  GENERAL: vitals reviewed and listed above, alert, oriented, appears well hydrated and in no acute distress  HEENT: atraumatic, conjunttiva clear, no obvious abnormalities on inspection of external nose and ears  NECK: no obvious masses on inspection  LUNGS: clear to auscultation bilaterally, no wheezes, rales  or rhonchi, good air movement  CV: HRRR, no peripheral edema. No carotid  MS: moves all extremities without noticeable abnormality  PSYCH: pleasant and cooperative, no obvious depression or anxiety  ASSESSMENT AND PLAN:  Discussed the following assessment and plan:  1. Encounter to establish care - Spoke to patient about PNA, Tetanus and Shingles vaccinations. He is not interested in any at this time. Risks were explained to the patient on not getting the vaccinations and how they are recommended. He will consider it and get back with me.  - Follow up in December for complete physical .  - Follow up sooner if needed.   2. Type 2 diabetes mellitus without complication Seems well controlled at this time. He has Endocrine appointment next week. I will follow up on A1C result then. He was given a sample of Novalog in the office.   3. Essential hypertension Appears well controlled on current medication regimen. He is to follow up if he notices his blood pressure getting too high or two low.     -We reviewed the PMH, PSH, FH, SH, Meds and Allergies. -We provided refills for any medications we will prescribe as needed. -We addressed current concerns per orders and patient instructions. -We have advised patient to follow up per instructions below.   -Patient advised to return or notify a provider immediately if symptoms worsen or persist or new concerns arise.    Shirline Freesory Oddie Kuhlmann, AGNP

## 2014-08-02 NOTE — Patient Instructions (Addendum)
It was a pleasure meeting you today. Follow up with me in December for your yearly physical. Please do not hesitate to come see me if you need anything in the mean time. Continue to work on your diet and cut out soda. Also, think about joining [employer] to get some exercise into your daily routine.   Health Maintenance A healthy lifestyle and preventative care can promote health and wellness.  Maintain regular health, dental, and eye exams.  Eat a healthy diet. Foods like vegetables, fruits, whole grains, low-fat dairy products, and lean protein foods contain the nutrients you need and are low in calories. Decrease your intake of foods high in solid fats, added sugars, and salt. Get information about a proper diet from your health care provider, if necessary.  Regular physical exercise is one of the most important things you can do for your health. Most adults should get at least 150 minutes of moderate-intensity exercise (any activity that increases your heart rate and causes you to sweat) each week. In addition, most adults need muscle-strengthening exercises on 2 or more days a week.   Maintain a healthy weight. The body mass index (BMI) is a screening tool to identify possible weight problems. It provides an estimate of body fat based on height and weight. Your health care provider can find your BMI and can help you achieve or maintain a healthy weight. For males 20 years and older:  A BMI below 18.5 is considered underweight.  A BMI of 18.5 to 24.9 is normal.  A BMI of 25 to 29.9 is considered overweight.  A BMI of 30 and above is considered obese.  Maintain normal blood lipids and cholesterol by exercising and minimizing your intake of saturated fat. Eat a balanced diet with plenty of fruits and vegetables. Blood tests for lipids and cholesterol should begin at age 5 and be repeated every 5 years. If your lipid or cholesterol levels are high, you are over age 66, or you are at high  risk for heart disease, you may need your cholesterol levels checked more frequently.Ongoing high lipid and cholesterol levels should be treated with medicines if diet and exercise are not working.  If you smoke, find out from your health care provider how to quit. If you do not use tobacco, do not start.  Lung cancer screening is recommended for adults aged 55-80 years who are at high risk for developing lung cancer because of a history of smoking. A yearly low-dose CT scan of the lungs is recommended for people who have at least a 30-pack-year history of smoking and are current smokers or have quit within the past 15 years. A pack year of smoking is smoking an average of 1 pack of cigarettes a day for 1 year (for example, a 30-pack-year history of smoking could mean smoking 1 pack a day for 30 years or 2 packs a day for 15 years). Yearly screening should continue until the smoker has stopped smoking for at least 15 years. Yearly screening should be stopped for people who develop a health problem that would prevent them from having lung cancer treatment.  If you choose to drink alcohol, do not have more than 2 drinks per day. One drink is considered to be 12 oz (360 mL) of beer, 5 oz (150 mL) of wine, or 1.5 oz (45 mL) of liquor.  Avoid the use of street drugs. Do not share needles with anyone. Ask for help if you need support or instructions  about stopping the use of drugs.  High blood pressure causes heart disease and increases the risk of stroke. Blood pressure should be checked at least every 1-2 years. Ongoing high blood pressure should be treated with medicines if weight loss and exercise are not effective.  If you are 2045-78 years old, ask your health care provider if you should take aspirin to prevent heart disease.  Diabetes screening involves taking a blood sample to check your fasting blood sugar level. This should be done once every 3 years after age 78 if you are at a normal weight and  without risk factors for diabetes. Testing should be considered at a younger age or be carried out more frequently if you are overweight and have at least 1 risk factor for diabetes.  Colorectal cancer can be detected and often prevented. Most routine colorectal cancer screening begins at the age of 78 and continues through age 78. However, your health care provider may recommend screening at an earlier age if you have risk factors for colon cancer. On a yearly basis, your health care provider may provide home test kits to check for hidden blood in the stool. A small camera at the end of a tube may be used to directly examine the colon (sigmoidoscopy or colonoscopy) to detect the earliest forms of colorectal cancer. Talk to your health care provider about this at age 78 when routine screening begins. A direct exam of the colon should be repeated every 5-10 years through age 78, unless early forms of precancerous polyps or small growths are found.  People who are at an increased risk for hepatitis B should be screened for this virus. You are considered at high risk for hepatitis B if:  You were born in a country where hepatitis B occurs often. Talk with your health care provider about which countries are considered high risk.  Your parents were born in a high-risk country and you have not received a shot to protect against hepatitis B (hepatitis B vaccine).  You have HIV or AIDS.  You use needles to inject street drugs.  You live with, or have sex with, someone who has hepatitis B.  You are a man who has sex with other men (MSM).  You get hemodialysis treatment.  You take certain medicines for conditions like cancer, organ transplantation, and autoimmune conditions.  Hepatitis C blood testing is recommended for all people born from [id number] through 1963 and any individual with known risk factors for hepatitis C.  Healthy men should no longer receive prostate-specific antigen (PSA) blood tests as  part of routine cancer screening. Talk to your health care provider about prostate cancer screening.  Testicular cancer screening is not recommended for adolescents or adult males who have no symptoms. Screening includes self-exam, a health care provider exam, and other screening tests. Consult with your health care provider about any symptoms you have or any concerns you have about testicular cancer.  Practice safe sex. Use condoms and avoid high-risk sexual practices to reduce the spread of sexually transmitted infections (STIs).  You should be screened for STIs, including gonorrhea and chlamydia if:  You are sexually active and are younger than 24 years.  You are older than 24 years, and your health care provider tells you that you are at risk for this type of infection.  Your sexual activity has changed since you were last screened, and you are at an increased risk for chlamydia or gonorrhea. Ask your health care provider if you  are at risk.  If you are at risk of being infected with HIV, it is recommended that you take a prescription medicine daily to prevent HIV infection. This is called pre-exposure prophylaxis (PrEP). You are considered at risk if:  You are a man who has sex with other men (MSM).  You are a heterosexual man who is sexually active with multiple partners.  You take drugs by injection.  You are sexually active with a partner who has HIV.  Talk with your health care provider about whether you are at high risk of being infected with HIV. If you choose to begin PrEP, you should first be tested for HIV. You should then be tested every 3 months for as long as you are taking PrEP.  Use sunscreen. Apply sunscreen liberally and repeatedly throughout the day. You should seek shade when your shadow is shorter than you. Protect yourself by wearing long sleeves, pants, a wide-brimmed hat, and sunglasses year round whenever you are outdoors.  Tell your health care provider of new  moles or changes in moles, especially if there is a change in shape or color. Also, tell your health care provider if a mole is larger than the size of a pencil eraser.  A one-time screening for abdominal aortic aneurysm (AAA) and surgical repair of large AAAs by ultrasound is recommended for men aged 65-75 years who are current or former smokers.  Stay current with your vaccines (immunizations). Document Released: 09/26/2007 Document Revised: 04/04/2013 Document Reviewed: 08/25/2010 [facility] Patient Information 2015 [place]. This information is not intended to replace advice given to you by your health care provider. Make sure you discuss any questions you have with your health care provider.

## 2014-08-02 NOTE — Assessment & Plan Note (Signed)
Seems well controlled at this time. He has Endocrine appointment next week. I will follow up on A1C result then. He was given a sample of Novalog in the office.

## 2014-08-02 NOTE — Assessment & Plan Note (Signed)
Appears well controlled on current medication regimen. He is to follow up if he notices his blood pressure getting too high or two low.

## 2014-08-02 NOTE — Progress Notes (Signed)
Pre visit review using our clinic review tool, if applicable. No additional management support is needed unless otherwise documented below in the visit note. 

## 2014-08-03 ENCOUNTER — Other Ambulatory Visit: Payer: Self-pay

## 2014-08-03 MED ORDER — ATORVASTATIN CALCIUM 80 MG PO TABS: 80.0000 mg | ORAL_TABLET | Freq: Every day | ORAL | Status: DC

## 2014-08-13 ENCOUNTER — Other Ambulatory Visit: Payer: Self-pay

## 2014-08-13 MED ORDER — INSULIN ASPART 100 UNIT/ML FLEXPEN: PEN_INJECTOR | SUBCUTANEOUS | Status: DC

## 2014-08-14 ENCOUNTER — Ambulatory Visit: Payer: Commercial Managed Care - HMO | Admitting: Family

## 2014-08-15 ENCOUNTER — Other Ambulatory Visit: Payer: Self-pay

## 2014-08-15 MED ORDER — ASPIRIN EC 81 MG PO TBEC: 81.0000 mg | DELAYED_RELEASE_TABLET | Freq: Every day | ORAL | Status: DC

## 2014-08-15 MED ORDER — ACCU-CHEK AVIVA PLUS W/DEVICE KIT: PACK | Status: DC

## 2014-08-15 MED ORDER — GLUCOSE BLOOD VI STRP: ORAL_STRIP | Status: DC

## 2014-08-16 ENCOUNTER — Telehealth: Payer: Self-pay | Admitting: Endocrinology

## 2014-08-16 NOTE — Telephone Encounter (Signed)
Patient has question about medications, please advise

## 2014-08-20 ENCOUNTER — Telehealth: Payer: Self-pay | Admitting: Endocrinology

## 2014-08-20 MED ORDER — GLUCOSE BLOOD VI STRP: ORAL_STRIP | Status: DC

## 2014-08-20 MED ORDER — SURECHEK BLOOD GLUCOSE MONITOR DEVI: Status: DC

## 2014-08-20 NOTE — Telephone Encounter (Signed)
[facility] needs new rx for surecheck meter F# [phone]

## 2014-08-20 NOTE — Telephone Encounter (Signed)
Rx for surechek test meter sent to pt's pharmacy.

## 2014-08-21 ENCOUNTER — Ambulatory Visit (INDEPENDENT_AMBULATORY_CARE_PROVIDER_SITE_OTHER): Payer: Commercial Managed Care - HMO | Admitting: Endocrinology

## 2014-08-21 ENCOUNTER — Encounter: Payer: Self-pay | Admitting: Endocrinology

## 2014-08-21 VITALS — BP 126/86 | HR 69 | Temp 97.9°F | Ht 66.0 in | Wt 186.0 lb

## 2014-08-21 DIAGNOSIS — E119 Type 2 diabetes mellitus without complications: Secondary | ICD-10-CM

## 2014-08-21 LAB — HEMOGLOBIN A1C: Hgb A1c MFr Bld: 6.8 % — ABNORMAL HIGH (ref 4.6–6.5)

## 2014-08-21 LAB — MICROALBUMIN / CREATININE URINE RATIO
Creatinine,U: 115.3 mg/dL
MICROALB UR: 0.8 mg/dL (ref 0.0–1.9)
Microalb Creat Ratio: 0.7 mg/g (ref 0.0–30.0)

## 2014-08-21 MED ORDER — INSULIN ASPART 100 UNIT/ML FLEXPEN: 6.0000 [IU] | PEN_INJECTOR | Freq: Three times a day (TID) | SUBCUTANEOUS | Status: DC

## 2014-08-21 NOTE — Progress Notes (Signed)
Subjective:    Patient ID: William Aguirre, male    DOB: 1934-06-06, 80 y.o.   MRN: [id number]  HPI Pt returns for f/u of diabetes mellitus: DM type: 1 Dx'ed: 8676 Complications: CAD Therapy: insulin since soon after dx DKA: never Severe hypoglycemia: never Pancreatitis: never Other: he takes multiple daily injections Interval history: no cbg record, but states cbg's are well-controlled.  There is no trend throughout the day.  pt states he feels well in general.  Pt says he never misses the insulin.   Past Medical History  Diagnosis Date  . Diabetes mellitus without complication   . Hyperlipidemia   . Coronary artery disease     Moderate LAD, Diagonal, Circumflex disease by cath 2004  . Hypertension   . Dementia     Past Surgical History  Procedure Laterality Date  . None      History   Social History  . Marital Status: Married    Spouse Name: N/A  . Number of Children: 4  . Years of Education: N/A   Occupational History  . Accountant    Social History Main Topics  . Smoking status: Never Smoker   . Smokeless tobacco: Never Used  . Alcohol Use: No  . Drug Use: No  . Sexual Activity: Not on file   Other Topics Concern  . Not on file   Social History Narrative   Lives with wife in a one story home.   Retired Engineer, maintenance (IT).          Current Outpatient Prescriptions on File Prior to Visit  Medication Sig Dispense Refill  . aspirin EC 81 MG tablet Take 1 tablet (81 mg total) by mouth daily. 90 tablet 1  . atorvastatin (LIPITOR) 80 MG tablet Take 1 tablet (80 mg total) by mouth daily. 30 tablet 3  . Blood Glucose Monitoring Suppl (ACCU-CHEK AVIVA PLUS) W/DEVICE KIT Use to check blood sugar 1 time per day 1 kit 3  . Blood Glucose Monitoring Suppl (SURECHEK BLOOD GLUCOSE MONITOR) DEVI Use to check blood sugar 1 time per day 1 each 0  . clopidogrel (PLAVIX) 75 MG tablet Take 1 tablet (75 mg total) by mouth daily with breakfast. 90 tablet 1  . glucose blood (ACCU-CHEK  AVIVA) test strip Use to check blood sugar 1 time per day. And lancets 1/day 100 each 3  . glucose blood (SURECHEK BLOOD GLUCOSE TEST) test strip Use to check blood sugar 1 time per day. 100 each 2  . Insulin Glargine (LANTUS) 100 UNIT/ML Solostar Pen Inject 12 Units into the skin daily at 10 pm.    . Insulin Pen Needle (RELION MINI PEN NEEDLES) 31G X 6 MM MISC Use with insuin injections 3 times daily (Patient taking differently: Use with insuin injections 4 times daily) 200 each 3  . Lancets (FREESTYLE) lancets Use as instructed 100 each 5  . metoprolol (LOPRESSOR) 50 MG tablet Take 1 tablet (50 mg total) by mouth 2 (two) times daily. 60 tablet 3  . traMADol (ULTRAM) 50 MG tablet Take 1 tablet (50 mg total) by mouth 2 (two) times daily as needed for severe pain. 180 tablet 0  . valsartan (DIOVAN) 80 MG tablet TAKE 1 TABLET BY MOUTH ONCE DAILY 90 tablet 1   No current facility-administered medications on file prior to visit.    Allergies  Allergen Reactions  . Hydrocodone-Acetaminophen     Other reaction(s): Delusions (intolerance)    Family History  Problem Relation Age of Onset  .  Other Mother     Deceased, 63  . Other Father     Deceased  . Other Sister     Deceased  . Cancer Sister     Deceased  . Healthy Son     Living  . Heart disease Son     Deceased, 15 from heart valve dysfunction  . Healthy Daughter     BP 126/86 mmHg  Pulse 69  Temp(Src) 97.9 F (36.6 C) (Oral)  Ht 5' 6"  (1.676 m)  Wt 186 lb (84.369 kg)  BMI 30.04 kg/m2  SpO2 97%    Review of Systems He denies hypoglycemia and weight change    Objective:   Physical Exam VITAL SIGNS:  See vs page GENERAL: no distress Pulses: dorsalis pedis intact bilat.   MSK: no deformity of the feet CV: no leg edema Skin:  no ulcer on the feet.  normal color and temp on the feet. Neuro: sensation is intact to touch on the feet   Lab Results  Component Value Date   HGBA1C 6.8* 08/21/2014      Assessment &  Plan:  DM: well-controlled.  Please continue the same insulins  Patient is advised the following: Patient Instructions  check your blood sugar 4 times a YOF:VWAQLR the 3 meals, and at bedtime.  also check if you have symptoms of your blood sugar being too high or too low.  please keep a record of the readings and bring it to your next appointment here.  You can write it on any piece of paper.  please call us sooner if your blood sugar goes below 70, or if you have a lot of readings over 200.   For now, please continue the same insulins.   Please come back for a follow-up appointment in 6 months.  A diabetes blood test is requested for you today.  We'll contact you with results.

## 2014-08-21 NOTE — Patient Instructions (Addendum)
check your blood sugar 4 times a ZOX:WRUEAVday:before the 3 meals, and at bedtime.  also check if you have symptoms of your blood sugar being too high or too low.  please keep a record of the readings and bring it to your next appointment here.  You can write it on any piece of paper.  please call us sooner if your blood sugar goes below 70, or if you have a lot of readings over 200.   For now, please continue the same insulins.   Please come back for a follow-up appointment in 6 months.  A diabetes blood test is requested for you today.  We'll contact you with results.

## 2014-08-22 ENCOUNTER — Telehealth: Payer: Self-pay | Admitting: Endocrinology

## 2014-08-22 NOTE — Telephone Encounter (Signed)
I contacted the patient and advised I contacted his mail order pharmacy and I was notified by them the Accu-chek is the meter his insurance company will cover.

## 2014-08-22 NOTE — Telephone Encounter (Signed)
Insurance will only cover sure check meter and test strips. Please advise

## 2014-08-22 NOTE — Telephone Encounter (Signed)
Left voicemail advising previous A1C result. Pt notified no insulin adjustments have been changed. Requested call back if patient would like to discuss.

## 2014-08-22 NOTE — Telephone Encounter (Signed)
Patient is calling for the results of his lab work. °

## 2014-09-05 ENCOUNTER — Ambulatory Visit: Payer: Commercial Managed Care - HMO | Admitting: Nutrition

## 2014-09-14 ENCOUNTER — Other Ambulatory Visit: Payer: Self-pay | Admitting: *Deleted

## 2014-09-14 NOTE — Telephone Encounter (Signed)
Patient requests a refill of   traMADol (ULTRAM) 50 MG tablet 180 tablet 0 05/30/2014     Sig - Route: Take 1 tablet (50 mg total) by mouth 2 (two) times daily as needed for severe pain. - Oral    [facility] [place] [phone]) [phone] or fax [phone]  Okay to fill?

## 2014-09-17 MED ORDER — TRAMADOL HCL 50 MG PO TABS: 50.0000 mg | ORAL_TABLET | Freq: Two times a day (BID) | ORAL | Status: DC | PRN

## 2014-09-20 ENCOUNTER — Encounter: Payer: Self-pay | Admitting: Cardiovascular Disease

## 2014-09-20 ENCOUNTER — Ambulatory Visit (INDEPENDENT_AMBULATORY_CARE_PROVIDER_SITE_OTHER): Payer: Commercial Managed Care - HMO | Admitting: Cardiovascular Disease

## 2014-09-20 VITALS — BP 132/72 | HR 75 | Ht 68.0 in | Wt 198.0 lb

## 2014-09-20 DIAGNOSIS — I10 Essential (primary) hypertension: Secondary | ICD-10-CM

## 2014-09-20 DIAGNOSIS — I2510 Atherosclerotic heart disease of native coronary artery without angina pectoris: Secondary | ICD-10-CM

## 2014-09-20 DIAGNOSIS — E785 Hyperlipidemia, unspecified: Secondary | ICD-10-CM | POA: Diagnosis not present

## 2014-09-20 NOTE — Patient Instructions (Signed)
Medication Instructions:  Your physician recommends that you continue on your current medications as directed. Please refer to the Current Medication list given to you today.   Labwork: none  Testing/Procedures: none  Follow-Up: Your physician wants you to follow-up in: 6 months.  You will receive a reminder letter in the mail two months in advance. If you don't receive a letter, please call our office to schedule the follow-up appointment.       

## 2014-09-20 NOTE — Progress Notes (Signed)
Chief Complaint  Patient presents with  . Follow-up    History of Present Illness: 80 yo male with history of CAD, HTN, HLD, DM who is here today for cardiac follow up. He has been followed in the past by Dr. Lia Foyer. His last cath was in 2004 and showed moderate LAD, Diagonal, Circumflex disease with diabetic appearance of vessels. He has been managed medically since then. Exercise stress test 05/31/12 and he exercised for 3 minutes and stopped due to fatigue. He and Dr. Lia Foyer discussed arranging a stress myoview but the patient declined.  He is here today for follow up. He has been having exertional chest pressure. Occurs when carrying in the groceries. Pressure in the upper chest. No SOB. He is not exercising. Taking all meds.   Primary Care Physician: Phoebe Sharps  Last Lipid Profile:  Lipid Panel     Component Value Date/Time   CHOL 153 11/20/2013 0925   TRIG 84.0 11/20/2013 0925   HDL 49.40 11/20/2013 0925   CHOLHDL 3 11/20/2013 0925   VLDL 16.8 11/20/2013 0925   LDLCALC 87 11/20/2013 0925     Past Medical History  Diagnosis Date  . Diabetes mellitus without complication   . Hyperlipidemia   . Coronary artery disease     Moderate LAD, Diagonal, Circumflex disease by cath 2004  . Hypertension   . Dementia     Past Surgical History  Procedure Laterality Date  . None      Current Outpatient Prescriptions  Medication Sig Dispense Refill  . aspirin EC 81 MG tablet Take 1 tablet (81 mg total) by mouth daily. 90 tablet 1  . atorvastatin (LIPITOR) 80 MG tablet Take 1 tablet (80 mg total) by mouth daily. 30 tablet 3  . Blood Glucose Monitoring Suppl (ACCU-CHEK AVIVA PLUS) W/DEVICE KIT Use to check blood sugar 1 time per day 1 kit 3  . Blood Glucose Monitoring Suppl (SURECHEK BLOOD GLUCOSE MONITOR) DEVI Use to check blood sugar 1 time per day 1 each 0  . clopidogrel (PLAVIX) 75 MG tablet Take 1 tablet (75 mg total) by mouth daily with breakfast. 90 tablet 1  . glucose  blood (ACCU-CHEK AVIVA) test strip Use to check blood sugar 1 time per day. And lancets 1/day 100 each 3  . glucose blood (SURECHEK BLOOD GLUCOSE TEST) test strip Use to check blood sugar 1 time per day. 100 each 2  . insulin aspart (NOVOLOG) 100 UNIT/ML FlexPen Inject 6 Units into the skin 3 (three) times daily with meals. 15 mL 11  . Insulin Glargine (LANTUS) 100 UNIT/ML Solostar Pen Inject 12 Units into the skin daily at 10 pm.    . Insulin Pen Needle (RELION MINI PEN NEEDLES) 31G X 6 MM MISC Use with insuin injections 3 times daily (Patient taking differently: Use with insuin injections 4 times daily) 200 each 3  . Lancets (FREESTYLE) lancets Use as instructed 100 each 5  . metoprolol (LOPRESSOR) 50 MG tablet Take 1 tablet (50 mg total) by mouth 2 (two) times daily. 60 tablet 3  . traMADol (ULTRAM) 50 MG tablet Take 1 tablet (50 mg total) by mouth 2 (two) times daily as needed for severe pain. 180 tablet 0  . valsartan (DIOVAN) 80 MG tablet TAKE 1 TABLET BY MOUTH ONCE DAILY 90 tablet 1   No current facility-administered medications for this visit.    Allergies  Allergen Reactions  . Hydrocodone-Acetaminophen     Other reaction(s): Delusions (intolerance)    History  Social History  . Marital Status: Married    Spouse Name: N/A  . Number of Children: 4  . Years of Education: N/A   Occupational History  . Accountant    Social History Main Topics  . Smoking status: Never Smoker   . Smokeless tobacco: Never Used  . Alcohol Use: No  . Drug Use: No  . Sexual Activity: Not on file   Other Topics Concern  . Not on file   Social History Narrative   Lives with wife in a one story home.   Retired Engineer, maintenance (IT).          Family History  Problem Relation Age of Onset  . Other Mother     Deceased, 78  . Other Father     Deceased  . Other Sister     Deceased  . Cancer Sister     Deceased  . Healthy Son     Living  . Heart disease Son     Deceased, 56 from heart valve  dysfunction  . Healthy Daughter     Review of Systems:  As stated in the HPI and otherwise negative.   BP 132/72 mmHg  Pulse 75  Ht $R'5\' 8"'IG$  (1.727 m)  Wt 198 lb (89.812 kg)  BMI 30.11 kg/m2  Physical Examination: General: Well developed, well nourished, NAD HEENT: OP clear, mucus membranes moist SKIN: warm, dry. No rashes. Neuro: No focal deficits Musculoskeletal: Muscle strength 5/5 all ext Psychiatric: Mood and affect normal Neck: No JVD, no carotid bruits, no thyromegaly, no lymphadenopathy. Lungs:Clear bilaterally, no wheezes, rhonci, crackles Cardiovascular: Regular rate and rhythm. No murmurs, gallops or rubs. Abdomen:Soft. Bowel sounds present. Non-tender.  Extremities: No lower extremity edema. Pulses are 2 + in the bilateral DP/PT.  Cardiac cath December 2004: 1. Ventriculography was performed in the RAO projection. Overall systolic  function was mildly reduced. Ejection fraction was calculated at 47% but  appeared to be visually better. There was hypokinesis of the inferobasal  segment. No significant mitral regurgitation was noted.  2. The left main coronary artery was free of critical disease.  3. The left anterior descending artery coursed to the apex. The LAD and its  branch had a fairly typical diabetic appearance with multiple areas of  luminal irregularity. Beyond the origin of the major diagonal branch was  a 40-50% area of focal stenosis that was slightly hypodense that appeared  to be less severe in some views than others. There was also a 30-40%  ostial narrowing of the major diagonal branch. There was moderate  plaquing noted at the apical portion of the LAD as well; critical  stenoses, however, were not noted.  4. The circumflex is a dominant vessel. There is a first marginal branch  that has a typical diabetic appearance and a focal area of about 60%  narrowing. There is also a second marginal branch that bifurcates  distally and again has diffuse  luminal irregularities compatible with  diabetes. The A-V circumflex courses to the posterior wall, where it  supplies two posterolateral branches and then is totally occluded,  functioning as a posterior descending branch. In the initial RAO films,  there was evidence of some collateralization of the distal PDA, and this  had an appearance of being somewhat diffusely irregular.  5. The right coronary artery was a nondominant vessel that was free of  critical disease.  CONCLUSIONS:  1. Mild reduction in left ventricular function with an inferobasal wall  motion abnormality but with hypokinesis  and not akinesis.  2. Currently normal electrocardiogram.  3. Total occlusion of the distal circumflex prior to the posterior  descending branch with early collateralization of this vessel.  4. Moderate disease of the left anterior descending and diagonal system.  5. Typical diabetic appearance of the coronary arteries.  Echo 03/15/13: Left ventricle: The cavity size was normal. Wall thickness was normal. Systolic function was vigorous. The estimated ejection fraction was in the range of 65% to 70%. Wall motion was normal; there were no regional wall motion abnormalities. Doppler parameters are consistent with abnormal left ventricular relaxation (grade 1 diastolic dysfunction).   EKG:  EKG is ordered today. The ekg ordered today demonstrates NSR, rate 75 bpm. Poor R wave progression  Recent Labs: 04/10/2014: ALT 16; BUN 12; Creatinine, Ser 1.2; Potassium 4.3; Sodium 140   Lipid Panel    Component Value Date/Time   CHOL 153 11/20/2013 0925   TRIG 84.0 11/20/2013 0925   HDL 49.40 11/20/2013 0925   CHOLHDL 3 11/20/2013 0925   VLDL 16.8 11/20/2013 0925   LDLCALC 87 11/20/2013 0925     Wt Readings from Last 3 Encounters:  09/20/14 198 lb (89.812 kg)  08/21/14 186 lb (84.369 kg)  08/02/14 196 lb 4.8 oz (89.041 kg)     Other studies Reviewed: Additional studies/ records that were  reviewed today include:  Review of the above records demonstrates:    Assessment and Plan:   1. CAD: Chest pressure concerning for angina. He is refusing stress testing. No changes in therapy. Continue ASA/Plavix/statin/beta blocker. He will call with worsening chest pain. LVEF normal on echo 03/15/13.  2. Hyperlipidemia: He is on a statin. LDL 87. Did not wish to stay on zetia due to cost.   3. HTN: BP controlled today.    4. Diabetes mellitus: Managed in primary care. Reports good control of blood sugars.   Current medicines are reviewed at length with the patient today.  The patient does not have concerns regarding medicines.  The following changes have been made:  no change  Labs/ tests ordered today include:   Orders Placed This Encounter  Procedures  . EKG 12-Lead    Disposition:   FU with me in 6 months  Signed, Lauree Chandler, MD 09/20/2014 6:25 PM    [address], [place], [place]  [address] Phone: [phone]; Fax: [phone]

## 2014-10-03 ENCOUNTER — Ambulatory Visit (INDEPENDENT_AMBULATORY_CARE_PROVIDER_SITE_OTHER): Payer: Commercial Managed Care - HMO | Admitting: Family

## 2014-10-03 ENCOUNTER — Encounter: Payer: Self-pay | Admitting: Family

## 2014-10-03 VITALS — BP 130/90 | HR 76 | Temp 97.7°F | Wt 200.0 lb

## 2014-10-03 DIAGNOSIS — R059 Cough, unspecified: Secondary | ICD-10-CM

## 2014-10-03 DIAGNOSIS — R05 Cough: Secondary | ICD-10-CM

## 2014-10-03 DIAGNOSIS — I10 Essential (primary) hypertension: Secondary | ICD-10-CM

## 2014-10-03 DIAGNOSIS — E119 Type 2 diabetes mellitus without complications: Secondary | ICD-10-CM

## 2014-10-03 NOTE — Progress Notes (Signed)
Pre visit review using our clinic review tool, if applicable. No additional management support is needed unless otherwise documented below in the visit note. 

## 2014-10-03 NOTE — Progress Notes (Signed)
Subjective:    Patient ID: William Aguirre, male    DOB: 1935/03/08, 80 y.o.   MRN: [id number]  HPI 80 year old African-American male, nonsmoker is in today with complaints of a cough 2 weeks that has improved over the last couple days after he began taking Coricidin HBP. Reports feeling great. No concerns. Wife 1 into, be checked today. No wheezing, congestion. Cough is productive with phlegm that is clear. Has a history of type 2 diabetes and hypertension.   Review of Systems  Constitutional: Negative.   HENT: Negative.   Respiratory: Positive for cough. Negative for shortness of breath and wheezing.   Gastrointestinal: Negative.   Endocrine: Negative.   Genitourinary: Negative.   Musculoskeletal: Negative.   Skin: Negative.   Allergic/Immunologic: Negative.   Hematological: Negative.   Psychiatric/Behavioral: Negative.    Past Medical History  Diagnosis Date  . Diabetes mellitus without complication   . Hyperlipidemia   . Coronary artery disease     Moderate LAD, Diagonal, Circumflex disease by cath 2004  . Hypertension   . Dementia     History   Social History  . Marital Status: Married    Spouse Name: N/A  . Number of Children: 4  . Years of Education: N/A   Occupational History  . Accountant    Social History Main Topics  . Smoking status: Never Smoker   . Smokeless tobacco: Never Used  . Alcohol Use: No  . Drug Use: No  . Sexual Activity: Not on file   Other Topics Concern  . Not on file   Social History Narrative   Lives with wife in a one story home.   Retired Engineer, maintenance (IT).          Past Surgical History  Procedure Laterality Date  . None      Family History  Problem Relation Age of Onset  . Other Mother     Deceased, 68  . Other Father     Deceased  . Other Sister     Deceased  . Cancer Sister     Deceased  . Healthy Son     Living  . Heart disease Son     Deceased, 46 from heart valve dysfunction  . Healthy Daughter     Allergies    Allergen Reactions  . Hydrocodone-Acetaminophen     Other reaction(s): Delusions (intolerance)    Current Outpatient Prescriptions on File Prior to Visit  Medication Sig Dispense Refill  . aspirin EC 81 MG tablet Take 1 tablet (81 mg total) by mouth daily. 90 tablet 1  . atorvastatin (LIPITOR) 80 MG tablet Take 1 tablet (80 mg total) by mouth daily. 30 tablet 3  . Blood Glucose Monitoring Suppl (ACCU-CHEK AVIVA PLUS) W/DEVICE KIT Use to check blood sugar 1 time per day 1 kit 3  . Blood Glucose Monitoring Suppl (SURECHEK BLOOD GLUCOSE MONITOR) DEVI Use to check blood sugar 1 time per day 1 each 0  . clopidogrel (PLAVIX) 75 MG tablet Take 1 tablet (75 mg total) by mouth daily with breakfast. 90 tablet 1  . glucose blood (ACCU-CHEK AVIVA) test strip Use to check blood sugar 1 time per day. And lancets 1/day 100 each 3  . glucose blood (SURECHEK BLOOD GLUCOSE TEST) test strip Use to check blood sugar 1 time per day. 100 each 2  . insulin aspart (NOVOLOG) 100 UNIT/ML FlexPen Inject 6 Units into the skin 3 (three) times daily with meals. 15 mL 11  . Insulin Glargine (  LANTUS) 100 UNIT/ML Solostar Pen Inject 12 Units into the skin daily at 10 pm.    . Insulin Pen Needle (RELION MINI PEN NEEDLES) 31G X 6 MM MISC Use with insuin injections 3 times daily (Patient taking differently: Use with insuin injections 4 times daily) 200 each 3  . Lancets (FREESTYLE) lancets Use as instructed 100 each 5  . metoprolol (LOPRESSOR) 50 MG tablet Take 1 tablet (50 mg total) by mouth 2 (two) times daily. 60 tablet 3  . traMADol (ULTRAM) 50 MG tablet Take 1 tablet (50 mg total) by mouth 2 (two) times daily as needed for severe pain. 180 tablet 0  . valsartan (DIOVAN) 80 MG tablet TAKE 1 TABLET BY MOUTH ONCE DAILY 90 tablet 1   No current facility-administered medications on file prior to visit.    BP 130/90 mmHg  Pulse 76  Temp(Src) 97.7 F (36.5 C) (Oral)  Wt 200 lb (90.719 kg)  SpO2 97%chart     Objective:    Physical Exam  Constitutional: He is oriented to person, place, and time. He appears well-developed and well-nourished.  HENT:  Right Ear: External ear normal.  Left Ear: External ear normal.  Nose: Nose normal.  Mouth/Throat: Oropharynx is clear and moist.  Neck: Normal range of motion. Neck supple.  Cardiovascular: Normal rate, regular rhythm and normal heart sounds.   Pulmonary/Chest: Effort normal and breath sounds normal.  Musculoskeletal: Normal range of motion.  Neurological: He is alert and oriented to person, place, and time.  Skin: Skin is warm and dry.  Psychiatric: He has a normal mood and affect.          Assessment & Plan:  William Aguirre was seen today for cough.  Diagnoses and all orders for this visit:  Cough  Essential hypertension  Type 2 diabetes mellitus without complication   Call the office with any questions or concerns. Discussed signs and symptoms of infection. Verbalized understanding and will call if symptoms worsen.

## 2014-10-22 ENCOUNTER — Other Ambulatory Visit: Payer: Self-pay | Admitting: Cardiovascular Disease

## 2014-11-14 ENCOUNTER — Telehealth: Payer: Self-pay | Admitting: Endocrinology

## 2014-11-14 MED ORDER — INSULIN PEN NEEDLE 31G X 6 MM MISC: Status: DC

## 2014-11-14 NOTE — Telephone Encounter (Signed)
I contacted the pt and advised we had not received a rx for his pen needles. Rx has been submitted to [facility] per pt's request.

## 2014-11-14 NOTE — Telephone Encounter (Signed)
Pt wanting to know why his pen needle rx is declined

## 2014-11-21 ENCOUNTER — Other Ambulatory Visit: Payer: Self-pay | Admitting: Family

## 2014-11-21 ENCOUNTER — Other Ambulatory Visit: Payer: Self-pay | Admitting: Cardiovascular Disease

## 2014-12-31 ENCOUNTER — Ambulatory Visit (INDEPENDENT_AMBULATORY_CARE_PROVIDER_SITE_OTHER): Payer: Commercial Managed Care - HMO | Admitting: Adult Health

## 2014-12-31 ENCOUNTER — Encounter: Payer: Self-pay | Admitting: Adult Health

## 2014-12-31 VITALS — BP 140/80 | HR 72 | Temp 98.2°F | Wt 199.8 lb

## 2014-12-31 DIAGNOSIS — J209 Acute bronchitis, unspecified: Secondary | ICD-10-CM

## 2014-12-31 LAB — GLUCOSE, POCT (MANUAL RESULT ENTRY): POC GLUCOSE: 87 mg/dL (ref 70–99)

## 2014-12-31 MED ORDER — DOXYCYCLINE HYCLATE 100 MG PO CAPS: 100.0000 mg | ORAL_CAPSULE | Freq: Two times a day (BID) | ORAL | Status: DC

## 2014-12-31 MED ORDER — PREDNISONE 20 MG PO TABS: 20.0000 mg | ORAL_TABLET | Freq: Every day | ORAL | Status: DC

## 2014-12-31 NOTE — Progress Notes (Signed)
Subjective:    Patient ID: William Aguirre, male    DOB: 1934/05/24, 80 y.o.   MRN: [id number]  HPI  80 year old male who presents to the office today for 2-3 weeks of productive cough. His cough has been happening for " 2-3 months, but it got better and now it is getting worse again." The cough is worse at night. He has had episodes where it is hard to catch his breath due to the coughing spells. He denies any fever, no nausea, vomiting or diarrhea. No sick contacts.   Review of Systems  Constitutional: Negative for fever, chills and fatigue.  HENT: Negative.   Respiratory: Positive for cough. Negative for choking, chest tightness and shortness of breath.   Cardiovascular: Negative.   Musculoskeletal: Negative.   Neurological: Negative.   All other systems reviewed and are negative.  Past Medical History  Diagnosis Date  . Diabetes mellitus without complication   . Hyperlipidemia   . Coronary artery disease     Moderate LAD, Diagonal, Circumflex disease by cath 2004  . Hypertension   . Dementia     Social History   Social History  . Marital Status: Married    Spouse Name: N/A  . Number of Children: 4  . Years of Education: N/A   Occupational History  . Accountant    Social History Main Topics  . Smoking status: Never Smoker   . Smokeless tobacco: Never Used  . Alcohol Use: No  . Drug Use: No  . Sexual Activity: Not on file   Other Topics Concern  . Not on file   Social History Narrative   Lives with wife in a one story home.   Retired Engineer, maintenance (IT).          Past Surgical History  Procedure Laterality Date  . None      Family History  Problem Relation Age of Onset  . Other Mother     Deceased, 84  . Other Father     Deceased  . Other Sister     Deceased  . Cancer Sister     Deceased  . Healthy Son     Living  . Heart disease Son     Deceased, 80 from heart valve dysfunction  . Healthy Daughter     Allergies  Allergen Reactions  .  Hydrocodone-Acetaminophen     Other reaction(s): Delusions (intolerance)    Current Outpatient Prescriptions on File Prior to Visit  Medication Sig Dispense Refill  . aspirin EC 81 MG tablet Take 1 tablet (81 mg total) by mouth daily. 90 tablet 1  . atorvastatin (LIPITOR) 80 MG tablet TAKE 1 TABLET BY MOUTH ONCE DAILY 30 tablet 6  . Blood Glucose Monitoring Suppl (ACCU-CHEK AVIVA PLUS) W/DEVICE KIT Use to check blood sugar 1 time per day 1 kit 3  . Blood Glucose Monitoring Suppl (SURECHEK BLOOD GLUCOSE MONITOR) DEVI Use to check blood sugar 1 time per day 1 each 0  . clopidogrel (PLAVIX) 75 MG tablet TAKE 1 TABLET BY MOUTH ONCE DAILY WITH BREAKFAST 90 tablet 3  . glucose blood (ACCU-CHEK AVIVA) test strip Use to check blood sugar 1 time per day. And lancets 1/day 100 each 3  . glucose blood (SURECHEK BLOOD GLUCOSE TEST) test strip Use to check blood sugar 1 time per day. 100 each 2  . insulin aspart (NOVOLOG) 100 UNIT/ML FlexPen Inject 6 Units into the skin 3 (three) times daily with meals. 15 mL 11  .  Insulin Glargine (LANTUS) 100 UNIT/ML Solostar Pen Inject 12 Units into the skin daily at 10 pm.    . Insulin Pen Needle (RELION MINI PEN NEEDLES) 31G X 6 MM MISC Use with insuin injections 4 times daily 200 each 3  . Lancets (FREESTYLE) lancets Use as instructed 100 each 5  . metoprolol (LOPRESSOR) 50 MG tablet TAKE 1 TABLET BY MOUTH TWICE DAILY 60 tablet 6  . traMADol (ULTRAM) 50 MG tablet Take 1 tablet (50 mg total) by mouth 2 (two) times daily as needed for severe pain. 180 tablet 0  . valsartan (DIOVAN) 80 MG tablet TAKE 1 TABLET BY MOUTH ONCE DAILY 90 tablet 0   No current facility-administered medications on file prior to visit.    BP 140/80 mmHg  Pulse 72  Temp(Src) 98.2 F (36.8 C)  Wt 199 lb 12.8 oz (90.629 kg)       Objective:   Physical Exam  Constitutional: He is oriented to person, place, and time. He appears well-developed and well-nourished. No distress.    Cardiovascular: Normal rate, regular rhythm, normal heart sounds and intact distal pulses.  Exam reveals no gallop and no friction rub.   No murmur heard. Pulmonary/Chest: Effort normal and breath sounds normal. No respiratory distress. He has no wheezes. He has no rales. He exhibits no tenderness.  Has a non productive cough in the office   Musculoskeletal: Normal range of motion. He exhibits no edema or tenderness.  Neurological: He is alert and oriented to person, place, and time.  Skin: Skin is warm and dry. No rash noted. He is not diaphoretic. No erythema. No pallor.  Psychiatric: He has a normal mood and affect. His behavior is normal. Judgment and thought content normal.  Nursing note and vitals reviewed.     Assessment & Plan:  1. Acute bronchitis, unspecified organism - predniSONE (DELTASONE) 20 MG tablet; Take 1 tablet (20 mg total) by mouth daily with breakfast.  Dispense: 9 tablet; Refill: 0 31m x 3 days, 263mx 3 days.  - doxycycline (VIBRAMYCIN) 100 MG capsule; Take 1 capsule (100 mg total) by mouth 2 (two) times daily.  Dispense: 20 capsule; Refill: 0 - DG Chest 2 View; Future - POC Glucose (CBG) - Follow up if no improvement  - Consider referral to Pulmonology for possible Bronchiectasis,Bronchiolitis,Hyperreactive airway disease - Consider referral to Pulmonology.

## 2014-12-31 NOTE — Progress Notes (Signed)
Pre visit review using our clinic review tool, if applicable. No additional management support is needed unless otherwise documented below in the visit note. 

## 2014-12-31 NOTE — Patient Instructions (Addendum)
It was great seeing you again today!  Please take the prednisone as directed Day 1 40 mg Day 2 40 mg Day 3 40 mg Day 4 20 mg Day 5 20 mg Day 6 20 mg  - Monitor your blood sugar closely while take the prednisone    Also take the Doxycycline twice a day for 10 days.   Let me know if you need anything    Acute Bronchitis Bronchitis is inflammation of the airways that extend from the windpipe into the lungs (bronchi). The inflammation often causes mucus to develop. This leads to a cough, which is the most common symptom of bronchitis.  In acute bronchitis, the condition usually develops suddenly and goes away over time, usually in a couple weeks. Smoking, allergies, and asthma can make bronchitis worse. Repeated episodes of bronchitis may cause further lung problems.  CAUSES Acute bronchitis is most often caused by the same virus that causes a cold. The virus can spread from person to person (contagious) through coughing, sneezing, and touching contaminated objects. SIGNS AND SYMPTOMS   Cough.   Fever.   Coughing up mucus.   Body aches.   Chest congestion.   Chills.   Shortness of breath.   Sore throat.  DIAGNOSIS  Acute bronchitis is usually diagnosed through a physical exam. Your health care provider will also ask you questions about your medical history. Tests, such as chest X-rays, are sometimes done to rule out other conditions.  TREATMENT  Acute bronchitis usually goes away in a couple weeks. Oftentimes, no medical treatment is necessary. Medicines are sometimes given for relief of fever or cough. Antibiotic medicines are usually not needed but may be prescribed in certain situations. In some cases, an inhaler may be recommended to help reduce shortness of breath and control the cough. A cool mist vaporizer may also be used to help thin bronchial secretions and make it easier to clear the chest.  HOME CARE INSTRUCTIONS  Get plenty of rest.   Drink enough  fluids to keep your urine clear or pale yellow (unless you have a medical condition that requires fluid restriction). Increasing fluids may help thin your respiratory secretions (sputum) and reduce chest congestion, and it will prevent dehydration.   Take medicines only as directed by your health care provider.  If you were prescribed an antibiotic medicine, finish it all even if you start to feel better.  Avoid smoking and secondhand smoke. Exposure to cigarette smoke or irritating chemicals will make bronchitis worse. If you are a smoker, consider using nicotine gum or skin patches to help control withdrawal symptoms. Quitting smoking will help your lungs heal faster.   Reduce the chances of another bout of acute bronchitis by washing your hands frequently, avoiding people with cold symptoms, and trying not to touch your hands to your mouth, nose, or eyes.   Keep all follow-up visits as directed by your health care provider.  SEEK MEDICAL CARE IF: Your symptoms do not improve after 1 week of treatment.  SEEK IMMEDIATE MEDICAL CARE IF:  You develop an increased fever or chills.   You have chest pain.   You have severe shortness of breath.  You have bloody sputum.   You develop dehydration.  You faint or repeatedly feel like you are going to pass out.  You develop repeated vomiting.  You develop a severe headache. MAKE SURE YOU:   Understand these instructions.  Will watch your condition.  Will get help right away if you are  not doing well or get worse. Document Released: 05/07/2004 Document Revised: 08/14/2013 Document Reviewed: 09/20/2012 [facility] Patient Information 2015 [place], [place]. This information is not intended to replace advice given to you by your health care provider. Make sure you discuss any questions you have with your health care provider.

## 2015-01-01 ENCOUNTER — Telehealth: Payer: Self-pay | Admitting: Adult Health

## 2015-01-01 NOTE — Telephone Encounter (Signed)
Pt states the rx they picked up yesterday was not the same instructions as cory had given them. Please take the prednisone as directed Day 1 40 mg Day 2 40 mg Day 3 40 mg Day 4 20 mg Day 5 20 mg Day 6 20 mg  Pt received 9 tabs of 20 mg.  Pharm instructions say 1 tab daily w/ breakfast pls advise [facility]

## 2015-01-02 ENCOUNTER — Other Ambulatory Visit: Payer: Self-pay

## 2015-01-02 DIAGNOSIS — J209 Acute bronchitis, unspecified: Secondary | ICD-10-CM

## 2015-01-03 NOTE — Telephone Encounter (Signed)
I have already spoke with the phamacy

## 2015-01-04 ENCOUNTER — Encounter: Payer: Self-pay | Admitting: Adult Health

## 2015-01-04 ENCOUNTER — Ambulatory Visit (INDEPENDENT_AMBULATORY_CARE_PROVIDER_SITE_OTHER)
Admission: RE | Admit: 2015-01-04 | Discharge: 2015-01-04 | Disposition: A | Discharge status: Home / Self Care | Payer: Commercial Managed Care - HMO | Source: Ambulatory Visit | Attending: Adult Health | Admitting: Adult Health

## 2015-01-04 DIAGNOSIS — J209 Acute bronchitis, unspecified: Secondary | ICD-10-CM

## 2015-01-04 DIAGNOSIS — R05 Cough: Secondary | ICD-10-CM | POA: Diagnosis not present

## 2015-01-07 ENCOUNTER — Other Ambulatory Visit: Payer: Self-pay | Admitting: Adult Health

## 2015-01-07 ENCOUNTER — Telehealth: Payer: Self-pay | Admitting: Adult Health

## 2015-01-07 DIAGNOSIS — R053 Chronic cough: Secondary | ICD-10-CM

## 2015-01-07 DIAGNOSIS — R05 Cough: Secondary | ICD-10-CM

## 2015-01-07 NOTE — Telephone Encounter (Signed)
Spoke to Mrs. Thibault on the phone. She endorses that her husband continued to have a cough. He has finished his prednisone and has a couple of days of abx left. I informed her that his chest xray is normal. She would like to see pulmonology. Advised that I would put in the order. Also advised that they try OTC omeprazole to see if this cough could be related to acid reflux.

## 2015-01-07 NOTE — Telephone Encounter (Signed)
Pt never heard from cma.  They got the 20 mg and that is what pt has been on. Pt has t Pt will take his last  20 mg prednisone today and needs to know what to do. They did not know new directions were called in. Pt only has 20 mg the 3ed day and after that pls advise    Pt would also would like results of xray.

## 2015-01-09 ENCOUNTER — Telehealth: Payer: Self-pay | Admitting: Adult Health

## 2015-01-09 NOTE — Telephone Encounter (Signed)
Pt was seen on 12-31-14 and now coughing  up green sputum. Pt wife would like to know if cory would like her husband to come in for sputum collection. Pt wife would like a callback today

## 2015-01-10 ENCOUNTER — Other Ambulatory Visit: Payer: Self-pay | Admitting: Adult Health

## 2015-01-10 NOTE — Telephone Encounter (Addendum)
Unable to leave message on  answering machine at home and pt other number. Phone rung several times

## 2015-01-10 NOTE — Telephone Encounter (Signed)
Please schedule appointment; 15 minute appointment is fine.

## 2015-01-10 NOTE — Telephone Encounter (Signed)
I would be happy to see him.

## 2015-01-11 ENCOUNTER — Ambulatory Visit: Payer: Self-pay | Admitting: Adult Health

## 2015-01-11 NOTE — Telephone Encounter (Signed)
Left message on machine for pt to make  an appt. Pt has an appt today

## 2015-01-30 ENCOUNTER — Encounter: Payer: Self-pay | Admitting: Internal Medicine

## 2015-01-30 ENCOUNTER — Ambulatory Visit (INDEPENDENT_AMBULATORY_CARE_PROVIDER_SITE_OTHER): Payer: Commercial Managed Care - HMO | Admitting: Internal Medicine

## 2015-01-30 VITALS — BP 122/72 | HR 69 | Ht 68.0 in | Wt 205.0 lb

## 2015-01-30 DIAGNOSIS — R05 Cough: Secondary | ICD-10-CM

## 2015-01-30 DIAGNOSIS — R058 Other specified cough: Secondary | ICD-10-CM

## 2015-01-30 DIAGNOSIS — R0600 Dyspnea, unspecified: Secondary | ICD-10-CM | POA: Diagnosis not present

## 2015-01-30 MED ORDER — BISOPROLOL FUMARATE 5 MG PO TABS: 5.0000 mg | ORAL_TABLET | Freq: Every day | ORAL | Status: DC

## 2015-01-30 NOTE — Progress Notes (Signed)
Subjective:    Patient ID: William Aguirre, male    DOB: 09-10-1934,    MRN: [id number]  HPI  [age] yobm wife is nurse quit smoking 1967 new onset cough early June 2016 2016 referred by William Aguirre to pulmonar clinic 01/30/2015    01/30/2015 1st [name] office visit/ William Aguirre   Chief Complaint  Patient presents with  . Pulmonary Consult    Cough. Referred by William Freesory Nafziger, NP. Pt c/o of constant wet cough with clear/white mucus. Pt denies wheeze/CP/tightness. Pt does not use inhalers.   onset was abrupt in early June 2016, occurs daily,  Never occurs  Sleeping, ? Sometimes p certain foods but no h/o coughing up food or frank asp and never really coughs up much at all.  Breathing not as good with exertion since onset of cough.  Already tried doxy and pred some better.  No obvious other patterns in day to day or daytime variability or assoc cp or chest tightness, subjective wheeze or overt sinus or hb symptoms. No unusual exp hx or h/o childhood pna/ asthma or knowledge of premature birth.  Sleeping ok without nocturnal  or early am exacerbation  of respiratory  c/o's or need for noct saba. Also denies any obvious fluctuation of symptoms with weather or environmental changes or other aggravating or alleviating factors except as outlined above   Current Medications, Allergies, Complete Past Medical History, Past Surgical History, Family History, and Social History were reviewed in [facility] electronic medical record.            Review of Systems  Constitutional: Negative.  Negative for fever and unexpected weight change.  HENT: Negative.  Negative for congestion, dental problem, ear pain, nosebleeds, postnasal drip, rhinorrhea, sinus pressure, sneezing, sore throat and trouble swallowing.   Eyes: Negative.  Negative for redness and itching.  Respiratory: Positive for cough. Negative for chest tightness, shortness of breath and wheezing.   Cardiovascular: Negative.  Negative  for palpitations and leg swelling.  Gastrointestinal: Negative.  Negative for nausea and vomiting.  Endocrine: Negative.   Genitourinary: Negative.  Negative for dysuria.  Musculoskeletal: Negative.  Negative for joint swelling.  Skin: Negative.  Negative for rash.  Allergic/Immunologic: Negative.   Neurological: Negative.  Negative for headaches.  Hematological: Negative.  Does not bruise/bleed easily.  Psychiatric/Behavioral: Negative.  Negative for dysphoric mood. The patient is not nervous/anxious.        Objective:   Physical Exam  Stoic amb bm nad who lets his wife, a nurse, interpret his symptoms "I think he's got heart failure"   Wt Readings from Last 3 Encounters:  01/30/15 205 lb (92.987 kg)  12/31/14 199 lb 12.8 oz (90.629 kg)  10/03/14 200 lb (90.719 kg)    Vital signs reviewed    HEENT: nl dentition, turbinates, and orophanx. Nl external ear canals without cough reflex   NECK :  without JVD/Nodes/TM/ nl carotid upstrokes bilaterally   LUNGS: no acc muscle use, clear to A and P bilaterally without cough on insp or exp maneuvers   CV:  RRR  no s3 or murmur or increase in P2, trace bilateral pitting lower ext edema   ABD:  soft and nontender with nl excursion in the supine position. No bruits or organomegaly, bowel sounds nl  MS:  warm without deformities, calf tenderness, cyanosis or clubbing  SKIN: warm and dry without lesions    NEURO:  alert, approp, no deficits      I personally reviewed images  and agree with radiology impression as follows:  CXR:  01/04/15 No active cardiopulmonary disease.       Assessment & Plan:

## 2015-01-30 NOTE — Patient Instructions (Addendum)
Try stop lopressor and start bisoprolol 5 mg one time  daily  If cough returns first try these over the counter meds:  Try prilosec otc 20mg   Take 30-60 min before first meal of the day and Pepcid ac (famotidine) 20 mg one @  bedtime until cough is completely gone for at least a week without the need for cough suppression  For drainage / throat tickle try take CHLORPHENIRAMINE  4 mg - take one every 4 hours as needed - available over the counter- may cause drowsiness so start with just a bedtime dose or two and see how you tolerate it before trying in daytime    For cough > diabetic tussin or mucinex dm

## 2015-01-31 ENCOUNTER — Telehealth: Payer: Self-pay | Admitting: Internal Medicine

## 2015-01-31 ENCOUNTER — Encounter: Payer: Self-pay | Admitting: Internal Medicine

## 2015-01-31 DIAGNOSIS — R0600 Dyspnea, unspecified: Secondary | ICD-10-CM | POA: Insufficient documentation

## 2015-01-31 DIAGNOSIS — R05 Cough: Secondary | ICD-10-CM | POA: Insufficient documentation

## 2015-01-31 DIAGNOSIS — R058 Other specified cough: Secondary | ICD-10-CM | POA: Insufficient documentation

## 2015-01-31 NOTE — Telephone Encounter (Signed)
Attempted to call pt Was placed on hold for 2 minutes then line was disconnected  Will call pt back later

## 2015-01-31 NOTE — Assessment & Plan Note (Addendum)
No evidence of limiting sob or chf /active angina >>>  has cards f/u planned

## 2015-01-31 NOTE — Assessment & Plan Note (Signed)
The most common causes of chronic cough in immunocompetent adults include the following: upper airway cough syndrome (UACS), previously referred to as postnasal drip syndrome (PNDS), which is caused by variety of rhinosinus conditions; (2) asthma; (3) GERD; (4) chronic bronchitis from cigarette smoking or other inhaled environmental irritants; (5) nonasthmatic eosinophilic bronchitis; and (6) bronchiectasis.   These conditions, singly or in combination, have accounted for up to 94% of the causes of chronic cough in prospective studies.   Other conditions have constituted no >6% of the causes in prospective studies These have included bronchogenic carcinoma, chronic interstitial pneumonia, sarcoidosis, left ventricular failure, ACEI-induced cough, and aspiration from a condition associated with pharyngeal dysfunction.    Chronic cough is often simultaneously caused by more than one condition. A single cause has been found from 38 to 82% of the time, multiple causes from 18 to 62%. Multiply caused cough has been the result of three diseases up to 42% of the time.       I strongly suspect   Upper airway cough syndrome, so named because it's frequently impossible to sort out how much is  CR/sinusitis with freq throat clearing (which can be related to primary GERD)   vs  causing  secondary (" extra esophageal")  GERD from wide swings in gastric pressure that occur with throat clearing, often  promoting self use of mint and menthol lozenges that reduce the lower esophageal sphincter tone and exacerbate the problem further in a cyclical fashion.   These are the same pts (now being labeled as having "irritable larynx syndrome" by some cough centers) who not infrequently have a history of having failed to tolerate ace inhibitors,  dry powder inhalers or biphosphonates or report having atypical reflux symptoms that don't respond to standard doses of PPI , and are easily confused as having aecopd or asthma flares  by even experienced allergists/ pulmonologists.   Explained the natural history of uri and why it's necessary in patients at risk to treat GERD aggressively - at least  short term -   to reduce risk of evolving cyclical cough initially  triggered by epithelial injury and a heightened sensitivty to the effects of any upper airway irritants,  most importantly acid - related - then perpetuated by epithelial injury related to the cough itself as the upper airway collapses on itself.  That is, the more sensitive the epithelium becomes once it is damaged by the virus, the more the ensuing irritability> the more the cough, the more the secondary reflux (especially in those prone to reflux) the more the irritation of the sensitive mucosa and so on in a  Classic cyclical pattern.    I had an extended discussion with the patient reviewing all relevant studies completed to date and  lasting 35 minutes of a visit    Each maintenance medication was reviewed in detail including most importantly the difference between maintenance and prns and under what circumstances the prns are to be triggered using an action plan format that is not reflected in the computer generated alphabetically organized AVS.    Please see instructions for details which were reviewed in writing and the patient given a copy highlighting the part that I personally wrote and discussed at today's ov.

## 2015-02-01 MED ORDER — FAMOTIDINE 20 MG PO TABS: ORAL_TABLET | ORAL | Status: DC

## 2015-02-01 MED ORDER — CHLORPHENIRAMINE MALEATE 4 MG PO TABS: 4.0000 mg | ORAL_TABLET | ORAL | Status: DC | PRN

## 2015-02-01 MED ORDER — OMEPRAZOLE 20 MG PO CPDR: DELAYED_RELEASE_CAPSULE | ORAL | Status: DC

## 2015-02-01 NOTE — Telephone Encounter (Signed)
Pts wife calling to get prescriptions of Prilosec, Pepcid and Chlorpheniramine sent to pharmacy as prescription.  She says that pt has memory problems and [facility] puts all of his medications in packets so he doesn't forget a dose of medication.   Per MW last OV notes, [facility]. Pt's wife notified. Nothing further needed.

## 2015-02-14 ENCOUNTER — Other Ambulatory Visit: Payer: Self-pay | Admitting: Cardiovascular Disease

## 2015-02-18 ENCOUNTER — Other Ambulatory Visit: Payer: Self-pay | Admitting: Adult Health

## 2015-02-18 MED ORDER — VALSARTAN 80 MG PO TABS: 80.0000 mg | ORAL_TABLET | Freq: Every day | ORAL | Status: DC

## 2015-02-22 ENCOUNTER — Ambulatory Visit (INDEPENDENT_AMBULATORY_CARE_PROVIDER_SITE_OTHER): Payer: Commercial Managed Care - HMO | Admitting: Endocrinology

## 2015-02-22 ENCOUNTER — Encounter: Payer: Self-pay | Admitting: Endocrinology

## 2015-02-22 VITALS — BP 137/88 | HR 85 | Temp 97.9°F | Ht 68.0 in | Wt 200.0 lb

## 2015-02-22 DIAGNOSIS — E119 Type 2 diabetes mellitus without complications: Secondary | ICD-10-CM

## 2015-02-22 LAB — POCT GLYCOSYLATED HEMOGLOBIN (HGB A1C): HEMOGLOBIN A1C: 7.1

## 2015-02-22 NOTE — Progress Notes (Signed)
Subjective:    Patient ID: William Aguirre, male    DOB: May 10, 1934, 80 y.o.   MRN: [id number]  HPI Pt returns for f/u of diabetes mellitus: DM type: 1 Dx'ed: 4496 Complications: CAD Therapy: insulin since soon after dx DKA: never Severe hypoglycemia: never Pancreatitis: never Other: he takes multiple daily injections.  Interval history: no cbg record, but states cbg's are well-controlled.  There is no trend throughout the day.  pt states he feels well in general.  Pt says he seldom misses the insulin.   Past Medical History  Diagnosis Date  . Diabetes mellitus without complication ([place])   . Hyperlipidemia   . Coronary artery disease     Moderate LAD, Diagonal, Circumflex disease by cath 2004  . Hypertension   . Dementia     Past Surgical History  Procedure Laterality Date  . None      Social History   Social History  . Marital Status: Married    Spouse Name: N/A  . Number of Children: 4  . Years of Education: N/A   Occupational History  . Accountant    Social History Main Topics  . Smoking status: Former Smoker -- 3 years    Quit date: 04/13/1965  . Smokeless tobacco: Never Used  . Alcohol Use: No  . Drug Use: No  . Sexual Activity: Not on file   Other Topics Concern  . Not on file   Social History Narrative   Lives with wife in a one story home.   Retired Engineer, maintenance (IT).          Current Outpatient Prescriptions on File Prior to Visit  Medication Sig Dispense Refill  . ASPIRIN LOW DOSE 81 MG EC tablet TAKE 1 TABLET (81MG) BY MOUTH ONCE DAILY 90 tablet 0  . atorvastatin (LIPITOR) 80 MG tablet TAKE 1 TABLET BY MOUTH ONCE DAILY 30 tablet 6  . bisoprolol (ZEBETA) 5 MG tablet Take 1 tablet (5 mg total) by mouth daily. 30 tablet 11  . Blood Glucose Monitoring Suppl (ACCU-CHEK AVIVA PLUS) W/DEVICE KIT Use to check blood sugar 1 time per day 1 kit 3  . Blood Glucose Monitoring Suppl (SURECHEK BLOOD GLUCOSE MONITOR) DEVI Use to check blood sugar 1 time per day 1 each  0  . chlorpheniramine (SB CHLORPHENIRAMINE) 4 MG tablet Take 1 tablet (4 mg total) by mouth every 4 (four) hours as needed for allergies. 30 tablet 0  . clopidogrel (PLAVIX) 75 MG tablet TAKE 1 TABLET BY MOUTH ONCE DAILY WITH BREAKFAST 90 tablet 3  . famotidine (PEPCID) 20 MG tablet Take 1 tablet at bedtime until cough gone 30 tablet 0  . glucose blood (ACCU-CHEK AVIVA) test strip Use to check blood sugar 1 time per day. And lancets 1/day 100 each 3  . glucose blood (SURECHEK BLOOD GLUCOSE TEST) test strip Use to check blood sugar 1 time per day. 100 each 2  . insulin aspart (NOVOLOG) 100 UNIT/ML FlexPen Inject 6 Units into the skin 3 (three) times daily with meals. 15 mL 11  . Insulin Glargine (LANTUS) 100 UNIT/ML Solostar Pen Inject 12 Units into the skin daily at 10 pm.    . Insulin Pen Needle (RELION MINI PEN NEEDLES) 31G X 6 MM MISC Use with insuin injections 4 times daily 200 each 3  . Lancets (FREESTYLE) lancets Use as instructed 100 each 5  . omeprazole (PRILOSEC) 20 MG capsule Take 30-60 min prior to 1st meal of the day 30 capsule 6  .  traMADol (ULTRAM) 50 MG tablet Take 1 tablet (50 mg total) by mouth 2 (two) times daily as needed for severe pain. 180 tablet 0  . valsartan (DIOVAN) 80 MG tablet Take 1 tablet (80 mg total) by mouth daily. 90 tablet 0   No current facility-administered medications on file prior to visit.    Allergies  Allergen Reactions  . Hydrocodone-Acetaminophen     Other reaction(s): Delusions (intolerance)    Family History  Problem Relation Age of Onset  . Other Mother     Deceased, 28  . Other Father     Deceased  . Other Sister     Deceased  . Cancer Sister     Deceased  . Healthy Son     Living  . Heart disease Son     Deceased, 59 from heart valve dysfunction  . Healthy Daughter     BP 137/88 mmHg  Pulse 85  Temp(Src) 97.9 F (36.6 C) (Oral)  Ht 5' 8"  (1.727 m)  Wt 200 lb (90.719 kg)  BMI 30.42 kg/m2  SpO2 95%  Review of Systems He  denies hypoglycemia.      Objective:   Physical Exam VITAL SIGNS:  See vs page GENERAL: no distress Pulses: dorsalis pedis intact bilat.   MSK: no deformity of the feet.  CV: no leg edema Skin:  no ulcer on the feet.  normal color and temp on the feet. Neuro: sensation is intact to touch on the feet.    Lab Results  Component Value Date   HGBA1C 7.1 02/22/2015      Assessment & Plan:  DM: well-controlled, in view of advanced age.  Patient is advised the following: Patient Instructions  check your blood sugar 4 times a day: before the 3 meals, and at bedtime.  also check if you have symptoms of your blood sugar being too high or too low.  please keep a record of the readings and bring it to your next appointment here.  You can write it on any piece of paper.  please call us sooner if your blood sugar goes below 70, or if you have a lot of readings over 200.   For now, please continue the same insulins.   Please come back for a follow-up appointment in 3-6 months.

## 2015-02-22 NOTE — Patient Instructions (Addendum)
check your blood sugar 4 times a day: before the 3 meals, and at bedtime.  also check if you have symptoms of your blood sugar being too high or too low.  please keep a record of the readings and bring it to your next appointment here.  You can write it on any piece of paper.  please call us sooner if your blood sugar goes below 70, or if you have a lot of readings over 200.   For now, please continue the same insulins.   Please come back for a follow-up appointment in 3-6 months.

## 2015-02-25 ENCOUNTER — Other Ambulatory Visit: Payer: Self-pay | Admitting: *Deleted

## 2015-02-25 MED ORDER — CHLORPHENIRAMINE MALEATE 4 MG PO TABS: 4.0000 mg | ORAL_TABLET | ORAL | Status: DC | PRN

## 2015-02-25 MED ORDER — FAMOTIDINE 20 MG PO TABS: ORAL_TABLET | ORAL | Status: DC

## 2015-03-18 ENCOUNTER — Telehealth: Payer: Self-pay | Admitting: Endocrinology

## 2015-03-18 MED ORDER — "INSULIN SYRINGE 31G X 5/16"" 1 ML MISC": Status: DC

## 2015-03-18 NOTE — Telephone Encounter (Signed)
Rx sent per pt's request.  

## 2015-03-18 NOTE — Telephone Encounter (Signed)
Patient would like a prescription for syringes for Novalog and Lantus, instead of the pen needles and a 90 day supply

## 2015-03-19 ENCOUNTER — Ambulatory Visit (INDEPENDENT_AMBULATORY_CARE_PROVIDER_SITE_OTHER): Payer: Commercial Managed Care - HMO | Admitting: Adult Health

## 2015-03-19 VITALS — BP 134/84 | Temp 98.6°F | Ht 68.0 in | Wt 206.2 lb

## 2015-03-19 DIAGNOSIS — E785 Hyperlipidemia, unspecified: Secondary | ICD-10-CM | POA: Diagnosis not present

## 2015-03-19 DIAGNOSIS — Z0000 Encounter for general adult medical examination without abnormal findings: Secondary | ICD-10-CM | POA: Diagnosis not present

## 2015-03-19 DIAGNOSIS — E109 Type 1 diabetes mellitus without complications: Secondary | ICD-10-CM

## 2015-03-19 DIAGNOSIS — I10 Essential (primary) hypertension: Secondary | ICD-10-CM | POA: Diagnosis not present

## 2015-03-19 NOTE — Progress Notes (Signed)
Subjective:  Patient presents today for their annual wellness visit.    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 Had one fall this year, where he slipped in the bathroom. Had no injuries  Cardiac risk factors:  advanced age (older than 37 for men, 43 for women)  Hyperlipidemia  diabetes. Family History: unknown  Depression Screen None. PHQ2 0  Activities of Daily Living  Independent ADLs and IADLs  Hearing Difficulties: patient declines  Cognitive Testing No reported trouble.   Normal 3 word recall  List the Names of Other Physician/Practitioners you currently use: 1.[facility] - Pulmonology 2. Loanne Drilling - Endocinology  3. [facility] - Cardiology 4. Posey Pronto - Neurology    There is no immunization history on file for this patient. Required Immunizations needed today Flu - does not want one.   Screening tests- up to date Health Maintenance Due  Topic Date Due  . OPHTHALMOLOGY EXAM  07/12/1944    ROS- No pertinent positives discovered in course of AWV  The following were reviewed and entered/updated in epic: Past Medical History  Diagnosis Date  . Diabetes mellitus without complication ([place])   . Hyperlipidemia   . Coronary artery disease     Moderate LAD, Diagonal, Circumflex disease by cath 2004  . Hypertension   . Dementia    Patient Active Problem List   Diagnosis Date Noted  . Upper airway cough syndrome 01/31/2015  . Dyspnea 01/31/2015  . Special screening for malignant neoplasms, colon 06/01/2013  . Mild cognitive impairment with memory loss 06/01/2013  . Unsteady gait 05/08/2013  . Weakness generalized 05/08/2013  . DIAB W/O COMP TYPE I [JUV] NOT STATED UNCNTRL 08/20/2009  . HYPERLIPIDEMIA 08/18/2008  . Essential hypertension 08/18/2008  . INTRAOCULAR LENS  IMPLANT, HX OF 08/18/2008  . CATARACT EXTRACTION, RIGHT EYE, HX OF 08/18/2008  . Diabetes ([place]) 08/17/2008  . CORONARY ATHEROSCLEROSIS NATIVE CORONARY ARTERY 08/13/2008   Past Surgical History  Procedure Laterality Date  . None      Family History  Problem Relation Age of Onset  . Other Mother     Deceased, 91  . Other Father     Deceased  . Other Sister     Deceased  . Cancer Sister     Deceased  . Healthy Son     Living  . Heart disease Son     Deceased, 77 from heart valve dysfunction  . Healthy Daughter     Medications- reviewed and updated Current Outpatient Prescriptions  Medication Sig Dispense Refill  . ASPIRIN LOW DOSE 81 MG EC tablet TAKE 1 TABLET (81MG) BY MOUTH ONCE DAILY 90 tablet 0  . atorvastatin (LIPITOR) 80 MG tablet TAKE 1 TABLET BY MOUTH ONCE DAILY 30 tablet 6  . bisoprolol (ZEBETA) 5 MG tablet Take 1 tablet (5 mg total) by mouth daily. 30 tablet 11  . Blood Glucose Monitoring Suppl (ACCU-CHEK AVIVA PLUS) W/DEVICE KIT Use to check blood sugar 1 time per day 1 kit 3  . chlorpheniramine (SB CHLORPHENIRAMINE) 4 MG tablet Take 1 tablet (4 mg total) by mouth every 4 (four) hours as needed for allergies. 30 tablet 0  . clopidogrel (PLAVIX) 75 MG tablet TAKE 1 TABLET BY MOUTH ONCE DAILY WITH BREAKFAST 90 tablet 3  . famotidine (PEPCID) 20 MG tablet Take 1 tablet at bedtime until cough gone 30 tablet  0  . glucose blood (ACCU-CHEK AVIVA) test strip Use to check blood sugar 1 time per day. And lancets 1/day 100 each 3  . insulin aspart (NOVOLOG) 100 UNIT/ML FlexPen Inject 6 Units into the skin 3 (three) times daily with meals. 15 mL 11  . Insulin Glargine (LANTUS) 100 UNIT/ML Solostar Pen Inject 12 Units into the skin daily at 10 pm.    . Insulin Pen Needle (RELION MINI PEN NEEDLES) 31G X 6 MM MISC Use with insuin injections 4 times daily 200 each 3  . Insulin Syringe-Needle U-100 (INSULIN SYRINGE 1CC/31GX5/16") 31G X 5/16" 1 ML MISC Use to inject insulin 4 times per  day 400 each 2  . omeprazole (PRILOSEC) 20 MG capsule Take 30-60 min prior to 1st meal of the day 30 capsule 6  . traMADol (ULTRAM) 50 MG tablet Take 1 tablet (50 mg total) by mouth 2 (two) times daily as needed for severe pain. 180 tablet 0  . valsartan (DIOVAN) 80 MG tablet Take 1 tablet (80 mg total) by mouth daily. 90 tablet 0  . ACCU-CHEK SOFTCLIX LANCETS lancets USE TO TEST BLOOD SUGAR ONCE DAILY  3   No current facility-administered medications for this visit.    Allergies-reviewed and updated Allergies  Allergen Reactions  . Hydrocodone-Acetaminophen     Other reaction(s): Delusions (intolerance)    Social History   Social History  . Marital Status: Married    Spouse Name: N/A  . Number of Children: 4  . Years of Education: N/A   Occupational History  . Accountant    Social History Main Topics  . Smoking status: Former Smoker -- 3 years    Quit date: 04/13/1965  . Smokeless tobacco: Never Used  . Alcohol Use: No  . Drug Use: No  . Sexual Activity: Not on file   Other Topics Concern  . Not on file   Social History Narrative   Lives with wife in a one story home.   Retired Engineer, maintenance ([employer]).          Objective: BP 134/84 mmHg  Temp(Src) 98.6 F (37 C) (Oral)  Ht _0  (1.727 m)  Wt 206 lb 3.2 oz (93.532 kg)  BMI 31.36 kg/m2 GENERAL: vitals reviewed and listed above, alert, oriented, appears well hydrated and in no acute distress  HEENT: atraumatic, conjunttiva clear, no obvious abnormalities on inspection of external nose and ears  NECK: no obvious masses on inspection  LUNGS: clear to auscultation bilaterally, no wheezes, rales or rhonchi, good air movement  CV: HRRR, no peripheral edema. No carotid  MS: moves all extremities without noticeable abnormality  PSYCH: pleasant and cooperative, no obvious depression or anxiety  Assessment/Plan:  1. Essential hypertension - controlled on current medication BP: 134/84 mmHg  - Basic metabolic panel; Future -  CBC with Differential/Platelet; Future - EKG 12-Lead-Sinus  Rhythm - Old anteroseptal infarct., Rate 72 - Hepatic function panel; Future - Lipid panel; Future - POCT urinalysis dipstick; Future - TSH; Future  2. Hyperlipidemia - Basic metabolic panel; Future - CBC with Differential/Platelet; Future - EKG 12-Lead - Hepatic function panel; Future - Lipid panel; Future - POCT urinalysis dipstick; Future - TSH; Future  3. Type 1 diabetes mellitus without complication (HCC) - Last A1c 7.1 on 02/22/2015 by Endocrinology - Basic metabolic panel; Future - CBC with Differential/Platelet; Future - EKG 12-Lead - Hepatic function panel; Future - Lipid panel; Future - POCT urinalysis dipstick; Future - TSH; Future  4. Medicare annual wellness visit, subsequent -  Follow up in one year for next wellness exam - Follow up sooner if needed - Work on diet and exercise.  - WIll follow up with on labs  Return precautions advised.    Dorothyann Peng, AGNP

## 2015-03-19 NOTE — Patient Instructions (Addendum)
It was great seeing you again today!  I will follow up with you regarding your labs.   I hope you have a very Merry Christmas. If you need anything, please let me know.   Health Maintenance, Male A healthy lifestyle and preventative care can promote health and wellness.  Maintain regular health, dental, and eye exams.  Eat a healthy diet. Foods like vegetables, fruits, whole grains, low-fat dairy products, and lean protein foods contain the nutrients you need and are low in calories. Decrease your intake of foods high in solid fats, added sugars, and salt. Get information about a proper diet from your health care provider, if necessary.  Regular physical exercise is one of the most important things you can do for your health. Most adults should get at least 150 minutes of moderate-intensity exercise (any activity that increases your heart rate and causes you to sweat) each week. In addition, most adults need muscle-strengthening exercises on 2 or more days a week.   Maintain a healthy weight. The body mass index (BMI) is a screening tool to identify possible weight problems. It provides an estimate of body fat based on height and weight. Your health care provider can find your BMI and can help you achieve or maintain a healthy weight. For males 20 years and older:  A BMI below 18.5 is considered underweight.  A BMI of 18.5 to 24.9 is normal.  A BMI of 25 to 29.9 is considered overweight.  A BMI of 30 and above is considered obese.  Maintain normal blood lipids and cholesterol by exercising and minimizing your intake of saturated fat. Eat a balanced diet with plenty of fruits and vegetables. Blood tests for lipids and cholesterol should begin at age 78 and be repeated every 5 years. If your lipid or cholesterol levels are high, you are over age 78, or you are at high risk for heart disease, you may need your cholesterol levels checked more frequently.Ongoing high lipid and cholesterol  levels should be treated with medicines if diet and exercise are not working.  If you smoke, find out from your health care provider how to quit. If you do not use tobacco, do not start.  Lung cancer screening is recommended for adults aged 55-80 years who are at high risk for developing lung cancer because of a history of smoking. A yearly low-dose CT scan of the lungs is recommended for people who have at least a 30-pack-year history of smoking and are current smokers or have quit within the past 15 years. A pack year of smoking is smoking an average of 1 pack of cigarettes a day for 1 year (for example, a 30-pack-year history of smoking could mean smoking 1 pack a day for 30 years or 2 packs a day for 15 years). Yearly screening should continue until the smoker has stopped smoking for at least 15 years. Yearly screening should be stopped for people who develop a health problem that would prevent them from having lung cancer treatment.  If you choose to drink alcohol, do not have more than 2 drinks per day. One drink is considered to be 12 oz (360 mL) of beer, 5 oz (150 mL) of wine, or 1.5 oz (45 mL) of liquor.  Avoid the use of street drugs. Do not share needles with anyone. Ask for help if you need support or instructions about stopping the use of drugs.  High blood pressure causes heart disease and increases the risk of stroke. High blood  pressure is more likely to develop in:  People who have blood pressure in the end of the normal range (100-139/85-89 mm Hg).  People who are overweight or obese.  People who are African American.  If you are 27-59 years of age, have your blood pressure checked every 3-5 years. If you are 30 years of age or older, have your blood pressure checked every year. You should have your blood pressure measured twice--once when you are at a hospital or clinic, and once when you are not at a hospital or clinic. Record the average of the two measurements. To check your  blood pressure when you are not at a hospital or clinic, you can use:  An automated blood pressure machine at a pharmacy.  A home blood pressure monitor.  If you are 99-57 years old, ask your health care provider if you should take aspirin to prevent heart disease.  Diabetes screening involves taking a blood sample to check your fasting blood sugar level. This should be done once every 3 years after age 40 if you are at a normal weight and without risk factors for diabetes. Testing should be considered at a younger age or be carried out more frequently if you are overweight and have at least 1 risk factor for diabetes.  Colorectal cancer can be detected and often prevented. Most routine colorectal cancer screening begins at the age of 86 and continues through age 62. However, your health care provider may recommend screening at an earlier age if you have risk factors for colon cancer. On a yearly basis, your health care provider may provide home test kits to check for hidden blood in the stool. A small camera at the end of a tube may be used to directly examine the colon (sigmoidoscopy or colonoscopy) to detect the earliest forms of colorectal cancer. Talk to your health care provider about this at age 64 when routine screening begins. A direct exam of the colon should be repeated every 5-10 years through age 34, unless early forms of precancerous polyps or small growths are found.  People who are at an increased risk for hepatitis B should be screened for this virus. You are considered at high risk for hepatitis B if:  You were born in a country where hepatitis B occurs often. Talk with your health care provider about which countries are considered high risk.  Your parents were born in a high-risk country and you have not received a shot to protect against hepatitis B (hepatitis B vaccine).  You have HIV or AIDS.  You use needles to inject street drugs.  You live with, or have sex with,  someone who has hepatitis B.  You are a man who has sex with other men (MSM).  You get hemodialysis treatment.  You take certain medicines for conditions like cancer, organ transplantation, and autoimmune conditions.  Hepatitis C blood testing is recommended for all people born from [birth year] through 1963 and any individual with known risk factors for hepatitis C.  Healthy men should no longer receive prostate-specific antigen (PSA) blood tests as part of routine cancer screening. Talk to your health care provider about prostate cancer screening.  Testicular cancer screening is not recommended for adolescents or adult males who have no symptoms. Screening includes self-exam, a health care provider exam, and other screening tests. Consult with your health care provider about any symptoms you have or any concerns you have about testicular cancer.  Practice safe sex. Use condoms and avoid  high-risk sexual practices to reduce the spread of sexually transmitted infections (STIs).  You should be screened for STIs, including gonorrhea and chlamydia if:  You are sexually active and are younger than 24 years.  You are older than 24 years, and your health care provider tells you that you are at risk for this type of infection.  Your sexual activity has changed since you were last screened, and you are at an increased risk for chlamydia or gonorrhea. Ask your health care provider if you are at risk.  If you are at risk of being infected with HIV, it is recommended that you take a prescription medicine daily to prevent HIV infection. This is called pre-exposure prophylaxis (PrEP). You are considered at risk if:  You are a man who has sex with other men (MSM).  You are a heterosexual man who is sexually active with multiple partners.  You take drugs by injection.  You are sexually active with a partner who has HIV.  Talk with your health care provider about whether you are at high risk of being  infected with HIV. If you choose to begin PrEP, you should first be tested for HIV. You should then be tested every 3 months for as long as you are taking PrEP.  Use sunscreen. Apply sunscreen liberally and repeatedly throughout the day. You should seek shade when your shadow is shorter than you. Protect yourself by wearing long sleeves, pants, a wide-brimmed hat, and sunglasses year round whenever you are outdoors.  Tell your health care provider of new moles or changes in moles, especially if there is a change in shape or color. Also, tell your health care provider if a mole is larger than the size of a pencil eraser.  A one-time screening for abdominal aortic aneurysm (AAA) and surgical repair of large AAAs by ultrasound is recommended for men aged 78-75 years who are current or former smokers.  Stay current with your vaccines (immunizations).   This information is not intended to replace advice given to you by your health care provider. Make sure you discuss any questions you have with your health care provider.   Document Released: 09/26/2007 Document Revised: 04/20/2014 Document Reviewed: 08/25/2010 Elsevier Interactive Patient Education [employer].

## 2015-03-20 ENCOUNTER — Other Ambulatory Visit: Payer: Self-pay | Admitting: Internal Medicine

## 2015-03-20 DIAGNOSIS — H25812 Combined forms of age-related cataract, left eye: Secondary | ICD-10-CM | POA: Diagnosis not present

## 2015-03-20 DIAGNOSIS — H02824 Cysts of left upper eyelid: Secondary | ICD-10-CM | POA: Diagnosis not present

## 2015-03-20 DIAGNOSIS — E119 Type 2 diabetes mellitus without complications: Secondary | ICD-10-CM | POA: Diagnosis not present

## 2015-03-20 MED ORDER — FAMOTIDINE 20 MG PO TABS: ORAL_TABLET | ORAL | Status: DC

## 2015-03-21 ENCOUNTER — Telehealth: Payer: Self-pay | Admitting: Endocrinology

## 2015-03-21 ENCOUNTER — Other Ambulatory Visit: Payer: Self-pay

## 2015-03-21 MED ORDER — INSULIN GLARGINE 100 UNIT/ML SOLOSTAR PEN: 12.0000 [IU] | PEN_INJECTOR | Freq: Every day | SUBCUTANEOUS | Status: DC

## 2015-03-21 NOTE — Telephone Encounter (Signed)
Patient called.

## 2015-03-25 ENCOUNTER — Other Ambulatory Visit: Payer: Self-pay

## 2015-03-25 MED ORDER — INSULIN GLARGINE 100 UNIT/ML ~~LOC~~ SOLN: 12.0000 [IU] | Freq: Every day | SUBCUTANEOUS | Status: DC

## 2015-03-26 ENCOUNTER — Other Ambulatory Visit (INDEPENDENT_AMBULATORY_CARE_PROVIDER_SITE_OTHER): Payer: Commercial Managed Care - HMO

## 2015-03-26 DIAGNOSIS — E119 Type 2 diabetes mellitus without complications: Secondary | ICD-10-CM | POA: Diagnosis not present

## 2015-03-26 DIAGNOSIS — E785 Hyperlipidemia, unspecified: Secondary | ICD-10-CM

## 2015-03-26 DIAGNOSIS — H2513 Age-related nuclear cataract, bilateral: Secondary | ICD-10-CM | POA: Diagnosis not present

## 2015-03-26 DIAGNOSIS — E109 Type 1 diabetes mellitus without complications: Secondary | ICD-10-CM

## 2015-03-26 DIAGNOSIS — I10 Essential (primary) hypertension: Secondary | ICD-10-CM | POA: Diagnosis not present

## 2015-03-26 DIAGNOSIS — N400 Enlarged prostate without lower urinary tract symptoms: Secondary | ICD-10-CM

## 2015-03-26 LAB — POCT URINALYSIS DIPSTICK
Bilirubin, UA: NEGATIVE
GLUCOSE UA: NEGATIVE
Ketones, UA: NEGATIVE
Leukocytes, UA: NEGATIVE
NITRITE UA: NEGATIVE
PROTEIN UA: NEGATIVE
RBC UA: NEGATIVE
Spec Grav, UA: 1.02
UROBILINOGEN UA: 0.2
pH, UA: 5.5

## 2015-03-26 LAB — CBC WITH DIFFERENTIAL/PLATELET
BASOS PCT: 0.3 % (ref 0.0–3.0)
Basophils Absolute: 0 10*3/uL (ref 0.0–0.1)
EOS PCT: 2.7 % (ref 0.0–5.0)
Eosinophils Absolute: 0.2 10*3/uL (ref 0.0–0.7)
HEMATOCRIT: 44.3 % (ref 39.0–52.0)
Hemoglobin: 14.3 g/dL (ref 13.0–17.0)
Lymphocytes Relative: 44.7 % (ref 12.0–46.0)
Lymphs Abs: 3.8 10*3/uL (ref 0.7–4.0)
MCHC: 32.4 g/dL (ref 30.0–36.0)
MCV: 85.1 fl (ref 78.0–100.0)
MONOS PCT: 12.2 % — AB (ref 3.0–12.0)
Monocytes Absolute: 1 10*3/uL (ref 0.1–1.0)
NEUTROS ABS: 3.4 10*3/uL (ref 1.4–7.7)
Neutrophils Relative %: 40.1 % — ABNORMAL LOW (ref 43.0–77.0)
PLATELETS: 160 10*3/uL (ref 150.0–400.0)
RBC: 5.2 Mil/uL (ref 4.22–5.81)
RDW: 14.6 % (ref 11.5–15.5)
WBC: 8.5 10*3/uL (ref 4.0–10.5)

## 2015-03-26 LAB — MICROALBUMIN / CREATININE URINE RATIO
Creatinine,U: 140.9 mg/dL
MICROALB UR: 1.5 mg/dL (ref 0.0–1.9)
Microalb Creat Ratio: 1.1 mg/g (ref 0.0–30.0)

## 2015-03-26 LAB — HEPATIC FUNCTION PANEL
ALBUMIN: 3.9 g/dL (ref 3.5–5.2)
ALT: 15 U/L (ref 0–53)
AST: 12 U/L (ref 0–37)
Alkaline Phosphatase: 103 U/L (ref 39–117)
Bilirubin, Direct: 0.1 mg/dL (ref 0.0–0.3)
TOTAL PROTEIN: 6.9 g/dL (ref 6.0–8.3)
Total Bilirubin: 0.6 mg/dL (ref 0.2–1.2)

## 2015-03-26 LAB — LIPID PANEL
Cholesterol: 195 mg/dL (ref 0–200)
HDL: 43.9 mg/dL (ref >39.00)
LDL Cholesterol: 124 mg/dL — ABNORMAL HIGH (ref 0–99)
NONHDL: 151.05
TRIGLYCERIDES: 137 mg/dL (ref 0.0–149.0)
Total CHOL/HDL Ratio: 4
VLDL: 27.4 mg/dL (ref 0.0–40.0)

## 2015-03-26 LAB — BASIC METABOLIC PANEL
BUN: 11 mg/dL (ref 6–23)
CHLORIDE: 104 meq/L (ref 96–112)
CO2: 29 meq/L (ref 19–32)
Calcium: 9.2 mg/dL (ref 8.4–10.5)
Creatinine, Ser: 1.09 mg/dL (ref 0.40–1.50)
GFR: 83.99 mL/min (ref >60.00)
Glucose, Bld: 186 mg/dL — ABNORMAL HIGH (ref 70–99)
POTASSIUM: 4.1 meq/L (ref 3.5–5.1)
SODIUM: 140 meq/L (ref 135–145)

## 2015-03-26 LAB — TSH: TSH: 2.13 u[IU]/mL (ref 0.35–4.50)

## 2015-03-26 LAB — PSA: PSA: 1.86 ng/mL (ref 0.10–4.00)

## 2015-03-26 LAB — HEMOGLOBIN A1C: Hgb A1c MFr Bld: 7.7 % — ABNORMAL HIGH (ref 4.6–6.5)

## 2015-04-19 ENCOUNTER — Telehealth: Payer: Self-pay | Admitting: Internal Medicine

## 2015-04-19 NOTE — Telephone Encounter (Signed)
Per last refill sent in it states to defer refills to PCP. [facility] Made him aware of above. Nothing further needed

## 2015-04-30 DIAGNOSIS — H5712 Ocular pain, left eye: Secondary | ICD-10-CM | POA: Diagnosis not present

## 2015-05-01 NOTE — Progress Notes (Signed)
Chief Complaint  Patient presents with  . Coronary Artery Disease    pt has no complaints today  . Hypertension    History of Present Illness: 81 yo male with history of CAD, HTN, HLD, DM who is here today for cardiac follow up. He has been followed in the past by Dr. Lia Foyer. His last cath was in 2004 and showed moderate LAD, Diagonal, Circumflex disease with diabetic appearance of vessels. He has been managed medically since then. Exercise stress test 05/31/12 and he exercised for 3 minutes and stopped due to fatigue. He and Dr. Lia Foyer discussed arranging a stress myoview but the patient declined. I saw him in June 2016 and he described chest pain that was worrisome for angina but he once again refused testing.   He is here today for follow up. He has not been having exertional chest pressure. No SOB. He is not exercising. Taking all meds.   Primary Care Provider:  Dorothyann Peng, NP   Past Medical History  Diagnosis Date  . Diabetes mellitus without complication ([place])   . Hyperlipidemia   . Coronary artery disease     Moderate LAD, Diagonal, Circumflex disease by cath 2004  . Hypertension   . Dementia     Past Surgical History  Procedure Laterality Date  . None      Current Outpatient Prescriptions  Medication Sig Dispense Refill  . ASPIRIN LOW DOSE 81 MG EC tablet TAKE 1 TABLET (81MG) BY MOUTH ONCE DAILY 90 tablet 0  . atorvastatin (LIPITOR) 80 MG tablet TAKE 1 TABLET BY MOUTH ONCE DAILY 30 tablet 6  . bisoprolol (ZEBETA) 5 MG tablet Take 1 tablet (5 mg total) by mouth daily. 30 tablet 11  . Blood Glucose Monitoring Suppl (ACCU-CHEK AVIVA PLUS) W/DEVICE KIT Use to check blood sugar 1 time per day 1 kit 3  . clopidogrel (PLAVIX) 75 MG tablet TAKE 1 TABLET BY MOUTH ONCE DAILY WITH BREAKFAST 90 tablet 3  . glucose blood (ACCU-CHEK AVIVA) test strip Use to check blood sugar 1 time per day. And lancets 1/day 100 each 3  . insulin aspart (NOVOLOG) 100 UNIT/ML FlexPen Inject 6  Units into the skin 3 (three) times daily with meals. 15 mL 11  . Insulin Glargine (LANTUS) 100 UNIT/ML Solostar Pen Inject 12 Units into the skin daily at 10 pm. 15 mL 2  . Insulin Pen Needle (RELION MINI PEN NEEDLES) 31G X 6 MM MISC Use with insuin injections 4 times daily 200 each 3  . Insulin Syringe-Needle U-100 (INSULIN SYRINGE 1CC/31GX5/16") 31G X 5/16" 1 ML MISC Use to inject insulin 4 times per day 400 each 2  . traMADol (ULTRAM) 50 MG tablet Take 1 tablet (50 mg total) by mouth 2 (two) times daily as needed for severe pain. 180 tablet 0  . valsartan (DIOVAN) 80 MG tablet Take 1 tablet (80 mg total) by mouth daily. 90 tablet 0  . ezetimibe (ZETIA) 10 MG tablet Take 1 tablet (10 mg total) by mouth daily. 30 tablet 11   No current facility-administered medications for this visit.    Allergies  Allergen Reactions  . Hydrocodone-Acetaminophen Other (See Comments)     Delusions    Social History   Social History  . Marital Status: Married    Spouse Name: N/A  . Number of Children: 4  . Years of Education: N/A   Occupational History  . Accountant    Social History Main Topics  . Smoking status: Former Smoker --  3 years    Quit date: 04/13/1965  . Smokeless tobacco: Never Used  . Alcohol Use: No  . Drug Use: No  . Sexual Activity: Not on file   Other Topics Concern  . Not on file   Social History Narrative   Lives with wife in a one story home.   Retired Engineer, maintenance ([employer]).          Family History  Problem Relation Age of Onset  . Other Mother     Deceased, 52  . Other Father     Deceased  . Other Sister     Deceased  . Cancer Sister     Deceased  . Healthy Son     Living  . Heart disease Son     Deceased, 106 from heart valve dysfunction  . Healthy Daughter     Review of Systems:  As stated in the HPI and otherwise negative.   BP 130/80 mmHg  Pulse 76  Ht _0  (1.727 m)  Wt 200 lb (90.719 kg)  BMI 30.42 kg/m2  SpO2 97%  Physical Examination: General: Well  developed, well nourished, NAD HEENT: OP clear, mucus membranes moist SKIN: warm, dry. No rashes. Neuro: No focal deficits Musculoskeletal: Muscle strength 5/5 all ext Psychiatric: Mood and affect normal Neck: No JVD, no carotid bruits, no thyromegaly, no lymphadenopathy. Lungs:Clear bilaterally, no wheezes, rhonci, crackles Cardiovascular: Regular rate and rhythm. No murmurs, gallops or rubs. Abdomen:Soft. Bowel sounds present. Non-tender.  Extremities: No lower extremity edema. Pulses are 2 + in the bilateral DP/PT.  Cardiac cath December 2004: 1. Ventriculography was performed in the RAO projection. Overall systolic  function was mildly reduced. Ejection fraction was calculated at 47% but  appeared to be visually better. There was hypokinesis of the inferobasal  segment. No significant mitral regurgitation was noted.  2. The left main coronary artery was free of critical disease.  3. The left anterior descending artery coursed to the apex. The LAD and its  branch had a fairly typical diabetic appearance with multiple areas of  luminal irregularity. Beyond the origin of the major diagonal branch was  a 40-50% area of focal stenosis that was slightly hypodense that appeared  to be less severe in some views than others. There was also a 30-40%  ostial narrowing of the major diagonal branch. There was moderate  plaquing noted at the apical portion of the LAD as well; critical  stenoses, however, were not noted.  4. The circumflex is a dominant vessel. There is a first marginal branch  that has a typical diabetic appearance and a focal area of about 60%  narrowing. There is also a second marginal branch that bifurcates  distally and again has diffuse luminal irregularities compatible with  diabetes. The A-V circumflex courses to the posterior wall, where it  supplies two posterolateral branches and then is totally occluded,  functioning as a posterior descending branch. In the initial  RAO films,  there was evidence of some collateralization of the distal PDA, and this  had an appearance of being somewhat diffusely irregular.  5. The right coronary artery was a nondominant vessel that was free of  critical disease.  CONCLUSIONS:  1. Mild reduction in left ventricular function with an inferobasal wall  motion abnormality but with hypokinesis and not akinesis.  2. Currently normal electrocardiogram.  3. Total occlusion of the distal circumflex prior to the posterior  descending branch with early collateralization of this vessel.  4. Moderate disease of the  left anterior descending and diagonal system.  5. Typical diabetic appearance of the coronary arteries.  Echo 03/15/13: Left ventricle: The cavity size was normal. Wall thickness was normal. Systolic function was vigorous. The estimated ejection fraction was in the range of 65% to 70%. Wall motion was normal; there were no regional wall motion abnormalities. Doppler parameters are consistent with abnormal left ventricular relaxation (grade 1 diastolic dysfunction).   EKG:  EKG is not ordered today. The ekg ordered today demonstrates   Recent Labs: 03/26/2015: ALT 15; BUN 11; Creatinine, Ser 1.09; Hemoglobin 14.3; Platelets 160.0; Potassium 4.1; Sodium 140; TSH 2.13   Lipid Panel    Component Value Date/Time   CHOL 195 03/26/2015 1001   TRIG 137.0 03/26/2015 1001   HDL 43.90 03/26/2015 1001   CHOLHDL 4 03/26/2015 1001   VLDL 27.4 03/26/2015 1001   LDLCALC 124* 03/26/2015 1001     Wt Readings from Last 3 Encounters:  05/02/15 200 lb (90.719 kg)  03/19/15 206 lb 3.2 oz (93.532 kg)  02/22/15 200 lb (90.719 kg)     Other studies Reviewed: Additional studies/ records that were reviewed today include:  Review of the above records demonstrates:    Assessment and Plan:   1. CAD: No recent chest pains. Continue ASA/Plavix/statin/beta blocker. He will call with worsening chest pain. LVEF normal on echo  03/15/13.  2. Hyperlipidemia: He is on a statin. Lipids December 2016 with LDL over 120. Will restart Zetia. Repeat lipids and LFTS in 12 weeks.    3. HTN: BP controlled today.  No changes today.   4. Diabetes mellitus: Managed in primary care. Reports good control of blood sugars.   Current medicines are reviewed at length with the patient today.  The patient does not have concerns regarding medicines.  The following changes have been made:  no change  Labs/ tests ordered today include:   No orders of the defined types were placed in this encounter.    Disposition:   FU with me in 12 months  Signed, Lauree Chandler, MD 05/02/2015 10:06 PM    [facility] [address], [place], [place]  [address] Phone: [phone]; Fax: [phone]

## 2015-05-02 ENCOUNTER — Encounter: Payer: Self-pay | Admitting: Cardiovascular Disease

## 2015-05-02 ENCOUNTER — Ambulatory Visit (INDEPENDENT_AMBULATORY_CARE_PROVIDER_SITE_OTHER): Payer: Commercial Managed Care - HMO | Admitting: Cardiovascular Disease

## 2015-05-02 VITALS — BP 130/80 | HR 76 | Ht 68.0 in | Wt 200.0 lb

## 2015-05-02 DIAGNOSIS — I2510 Atherosclerotic heart disease of native coronary artery without angina pectoris: Secondary | ICD-10-CM | POA: Diagnosis not present

## 2015-05-02 DIAGNOSIS — E785 Hyperlipidemia, unspecified: Secondary | ICD-10-CM

## 2015-05-02 DIAGNOSIS — I10 Essential (primary) hypertension: Secondary | ICD-10-CM | POA: Diagnosis not present

## 2015-05-02 MED ORDER — EZETIMIBE 10 MG PO TABS: 10.0000 mg | ORAL_TABLET | Freq: Every day | ORAL | Status: DC

## 2015-05-02 NOTE — Patient Instructions (Addendum)
Medication Instructions:  Your physician has recommended you make the following change in your medication: Start Zetia 10 mg by mouth daily   Labwork: none  Testing/Procedures: none  Follow-Up: Your physician wants you to follow-up in: 6 months.  You will receive a reminder letter in the mail two months in advance. If you don't receive a letter, please call our office to schedule the follow-up appointment.   Any Other Special Instructions Will Be Listed Below (If Applicable).     If you need a refill on your cardiac medications before your next appointment, please call your pharmacy.

## 2015-05-06 DIAGNOSIS — H269 Unspecified cataract: Secondary | ICD-10-CM | POA: Diagnosis not present

## 2015-05-06 DIAGNOSIS — H25812 Combined forms of age-related cataract, left eye: Secondary | ICD-10-CM | POA: Diagnosis not present

## 2015-05-10 ENCOUNTER — Telehealth: Payer: Self-pay | Admitting: *Deleted

## 2015-05-10 NOTE — Telephone Encounter (Signed)
I placed call to pt to schedule follow up blood work 12 weeks after starting Zetia.  Pt does not want to do lab work at that time and wants to wait until 6 month follow up.  I told him to be fasting when he comes in for 6 month follow up appt.

## 2015-05-16 ENCOUNTER — Other Ambulatory Visit: Payer: Self-pay | Admitting: Endocrinology

## 2015-05-16 ENCOUNTER — Other Ambulatory Visit: Payer: Self-pay | Admitting: Cardiovascular Disease

## 2015-05-16 ENCOUNTER — Telehealth: Payer: Self-pay | Admitting: Cardiovascular Disease

## 2015-05-17 ENCOUNTER — Other Ambulatory Visit: Payer: Self-pay | Admitting: *Deleted

## 2015-05-17 MED ORDER — VALSARTAN 80 MG PO TABS: 80.0000 mg | ORAL_TABLET | Freq: Every day | ORAL | Status: DC

## 2015-05-21 DIAGNOSIS — F09 Unspecified mental disorder due to known physiological condition: Secondary | ICD-10-CM | POA: Diagnosis not present

## 2015-05-22 ENCOUNTER — Other Ambulatory Visit: Payer: Self-pay | Admitting: Gerontology

## 2015-05-22 DIAGNOSIS — F09 Unspecified mental disorder due to known physiological condition: Secondary | ICD-10-CM

## 2015-05-30 ENCOUNTER — Other Ambulatory Visit: Payer: Commercial Managed Care - HMO

## 2015-06-12 ENCOUNTER — Ambulatory Visit
Admission: RE | Admit: 2015-06-12 | Discharge: 2015-06-12 | Disposition: A | Discharge status: Home / Self Care | Payer: Commercial Managed Care - HMO | Source: Ambulatory Visit | Attending: Gerontology | Admitting: Gerontology

## 2015-06-12 DIAGNOSIS — F09 Unspecified mental disorder due to known physiological condition: Secondary | ICD-10-CM

## 2015-06-12 DIAGNOSIS — R9082 White matter disease, unspecified: Secondary | ICD-10-CM | POA: Diagnosis not present

## 2015-06-13 ENCOUNTER — Other Ambulatory Visit: Payer: Self-pay | Admitting: Cardiovascular Disease

## 2015-08-13 ENCOUNTER — Other Ambulatory Visit: Payer: Self-pay | Admitting: Family Medicine

## 2015-08-13 MED ORDER — VALSARTAN 80 MG PO TABS: 80.0000 mg | ORAL_TABLET | Freq: Every day | ORAL | Status: DC

## 2015-08-23 ENCOUNTER — Ambulatory Visit: Payer: Self-pay | Admitting: Endocrinology

## 2015-09-11 ENCOUNTER — Ambulatory Visit (INDEPENDENT_AMBULATORY_CARE_PROVIDER_SITE_OTHER): Payer: Commercial Managed Care - HMO | Admitting: Endocrinology

## 2015-09-11 ENCOUNTER — Encounter: Payer: Self-pay | Admitting: Endocrinology

## 2015-09-11 VITALS — BP 156/96 | HR 89 | Ht 68.0 in | Wt 200.0 lb

## 2015-09-11 DIAGNOSIS — E109 Type 1 diabetes mellitus without complications: Secondary | ICD-10-CM

## 2015-09-11 LAB — POCT GLYCOSYLATED HEMOGLOBIN (HGB A1C): Hemoglobin A1C: 10.9

## 2015-09-11 NOTE — Progress Notes (Signed)
Subjective:    Patient ID: William Aguirre, male    DOB: [birth year], 81 y.o.   MRN: [id number]  HPI Pt returns for f/u of diabetes mellitus: DM type: Insulin-requiring type 2.   Dx'ed: 2263 Complications: CAD and polyneuropathy Therapy: insulin since soon after dx.  DKA: never Severe hypoglycemia: never Pancreatitis: never Other: he takes multiple daily injections.  Interval history: He says he checks cbg, and tells the result to his wife.  He says he does not recall the results.  He says he never misses the insulin.  pt states he feels well in general.   Past Medical History  Diagnosis Date  . Diabetes mellitus without complication ([place])   . Hyperlipidemia   . Coronary artery disease     Moderate LAD, Diagonal, Circumflex disease by cath 2004  . Hypertension   . Dementia     Past Surgical History  Procedure Laterality Date  . None      Social History   Social History  . Marital Status: Married    Spouse Name: N/A  . Number of Children: 4  . Years of Education: N/A   Occupational History  . Accountant    Social History Main Topics  . Smoking status: Former Smoker -- 3 years    Quit date: 04/13/1965  . Smokeless tobacco: Never Used  . Alcohol Use: No  . Drug Use: No  . Sexual Activity: Not on file   Other Topics Concern  . Not on file   Social History Narrative   Lives with wife in a one story home.   Retired Engineer, maintenance (IT).          Current Outpatient Prescriptions on File Prior to Visit  Medication Sig Dispense Refill  . ACCU-CHEK SOFTCLIX LANCETS lancets USE TO TEST BLOOD SUGAR ONCE DAILY 100 each 2  . ASPIRIN LOW DOSE 81 MG EC tablet TAKE 1 TABLET (81MG) BY MOUTH ONCE DAILY 90 tablet 3  . atorvastatin (LIPITOR) 80 MG tablet TAKE 1 TABLET BY MOUTH ONCE DAILY 30 tablet 8  . bisoprolol (ZEBETA) 5 MG tablet Take 1 tablet (5 mg total) by mouth daily. 30 tablet 11  . Blood Glucose Monitoring Suppl (ACCU-CHEK AVIVA PLUS) W/DEVICE KIT Use to check blood sugar 1 time  per day 1 kit 3  . clopidogrel (PLAVIX) 75 MG tablet TAKE 1 TABLET BY MOUTH ONCE DAILY WITH BREAKFAST 90 tablet 3  . ezetimibe (ZETIA) 10 MG tablet Take 1 tablet (10 mg total) by mouth daily. 30 tablet 11  . glucose blood (ACCU-CHEK AVIVA) test strip Use to check blood sugar 1 time per day. And lancets 1/day 100 each 3  . insulin aspart (NOVOLOG) 100 UNIT/ML FlexPen Inject 6 Units into the skin 3 (three) times daily with meals. 15 mL 11  . Insulin Glargine (LANTUS) 100 UNIT/ML Solostar Pen Inject 12 Units into the skin daily at 10 pm. 15 mL 2  . Insulin Syringe-Needle U-100 (INSULIN SYRINGE 1CC/31GX5/16") 31G X 5/16" 1 ML MISC Use to inject insulin 4 times per day 400 each 2  . traMADol (ULTRAM) 50 MG tablet Take 1 tablet (50 mg total) by mouth 2 (two) times daily as needed for severe pain. 180 tablet 0  . ULTICARE MINI PEN NEEDLES 31G X 6 MM MISC USE WITH INSULIN INJECTIONS 4 TIMES DAILY 200 each 2  . valsartan (DIOVAN) 80 MG tablet Take 1 tablet (80 mg total) by mouth daily. 90 tablet 0   No current facility-administered medications on  file prior to visit.    Allergies  Allergen Reactions  . Hydrocodone-Acetaminophen Other (See Comments)     Delusions    Family History  Problem Relation Age of Onset  . Other Mother     Deceased, 69  . Other Father     Deceased  . Other Sister     Deceased  . Cancer Sister     Deceased  . Healthy Son     Living  . Heart disease Son     Deceased, 51 from heart valve dysfunction  . Healthy Daughter     BP 156/96 mmHg  Pulse 89  Ht 5' 8"  (1.727 m)  Wt 200 lb (90.719 kg)  BMI 30.42 kg/m2  SpO2 98%   Review of Systems He denies hypoglycemia.  He says his memory is "pretty good."    Objective:   Physical Exam VITAL SIGNS:  See vs page GENERAL: no distress Pulses: dorsalis pedis intact bilat.   MSK: no deformity of the feet CV: no leg edema Skin:  no ulcer on the feet.  normal color and temp on the feet. Neuro: sensation is intact to  touch on the feet, but decreased from normal.    A1c=10.9%    Assessment & Plan:  Insulin-requiring type 2 DM: worse: I advised his to change to qd insulin, but he wants to continue multiple daily injections.  Memory loss: this is compromising the rx of his DM, by causing noncompliance with cbg recording: I'll work around this as best I can.   Patient is advised the following: Patient Instructions  check your blood sugar 4 times a day: before the 3 meals, and at bedtime.  also check if you have symptoms of your blood sugar being too high or too low.  please keep a record of the readings and bring it to your next appointment here.  You can write it on any piece of paper.  please call us sooner if your blood sugar goes below 70, or if you have a lot of readings over 200.   For now, please continue the same insulins for now Please call us in 1-2 days, to tell us how the blood sugar is doing.   Please come back for a follow-up appointment in 3 months.      Renato Shin, MD

## 2015-09-11 NOTE — Patient Instructions (Addendum)
check your blood sugar 4 times a day: before the 3 meals, and at bedtime.  also check if you have symptoms of your blood sugar being too high or too low.  please keep a record of the readings and bring it to your next appointment here.  You can write it on any piece of paper.  please call us sooner if your blood sugar goes below 70, or if you have a lot of readings over 200.   For now, please continue the same insulins for now Please call us in 1-2 days, to tell us how the blood sugar is doing.   Please come back for a follow-up appointment in 3 months.

## 2015-09-17 ENCOUNTER — Telehealth: Payer: Self-pay | Admitting: Adult Health

## 2015-09-17 MED ORDER — INSULIN ASPART 100 UNIT/ML FLEXPEN: 6.0000 [IU] | PEN_INJECTOR | Freq: Three times a day (TID) | SUBCUTANEOUS | Status: DC

## 2015-09-17 NOTE — Telephone Encounter (Signed)
I contacted the pt. Pt stated he is injecting 3 units tid.  Please advise, Thanks!

## 2015-09-17 NOTE — Telephone Encounter (Signed)
Please verify novolog  Is 5 units 3 times a day (just before each meal) Then increase to 8 units 3 times a day (just before each meal) i'll see you next time.

## 2015-09-17 NOTE — Telephone Encounter (Signed)
See note below and please advise, Thanks! 

## 2015-09-17 NOTE — Telephone Encounter (Signed)
I contacted the pt's wife and advised of note below. She stated she would advise the pt.

## 2015-09-17 NOTE — Telephone Encounter (Signed)
Patient would like his Novolog Pen prescription refilled.   The readings from his meter is as follows:  09/14/15   186 09/15/15   271 09/16/15   249 09/17/15   200

## 2015-09-17 NOTE — Telephone Encounter (Signed)
Thank you. please increase to 6 units 3 times a day (just before each meal)

## 2015-09-19 ENCOUNTER — Telehealth: Payer: Self-pay | Admitting: Endocrinology

## 2015-09-19 NOTE — Telephone Encounter (Signed)
todays BS reading is 192  Do you think the meter is wrong?

## 2015-09-19 NOTE — Telephone Encounter (Signed)
See note below and please advise, Thanks! 

## 2015-09-19 NOTE — Telephone Encounter (Signed)
We are always happy to compare your meter to a blood sugar drawn in our lab.  Just jet us know Also, we need more cbg's in order to know how things are going.

## 2015-09-20 ENCOUNTER — Telehealth: Payer: Self-pay | Admitting: Endocrinology

## 2015-09-20 DIAGNOSIS — E109 Type 1 diabetes mellitus without complications: Secondary | ICD-10-CM

## 2015-09-20 NOTE — Telephone Encounter (Signed)
See note below to be advised. Pt stated he would call next week to schedule a blood test to confirm his glucometer readings.

## 2015-09-20 NOTE — Telephone Encounter (Signed)
Pt bs reading was at 182

## 2015-09-20 NOTE — Telephone Encounter (Signed)
Ok, i ordered 

## 2015-09-20 NOTE — Telephone Encounter (Signed)
I contacted the pt and advised of instructions below. Pt voiced understanding and stated he would call back next week and let is know if he would like to come in for a lab test.

## 2015-09-24 ENCOUNTER — Telehealth: Payer: Self-pay | Admitting: Endocrinology

## 2015-09-24 NOTE — Telephone Encounter (Signed)
I contacted the pt's wife and advised of note below. She voiced understanding and will advise the pt.  

## 2015-09-24 NOTE — Telephone Encounter (Signed)
Patient stated that b/s was 168 this morning.

## 2015-09-24 NOTE — Telephone Encounter (Signed)
Thank you. Please continue the same medication. Please continue to check cbg's i'll see you next time.

## 2015-10-23 ENCOUNTER — Other Ambulatory Visit: Payer: Self-pay | Admitting: Cardiovascular Disease

## 2015-11-26 ENCOUNTER — Telehealth: Payer: Self-pay | Admitting: Cardiovascular Disease

## 2015-11-26 NOTE — Telephone Encounter (Signed)
New Message:   Please call,he says it is concerning his Zetia.

## 2015-11-26 NOTE — Telephone Encounter (Signed)
Left message on machine for pt to contact the office.   

## 2015-11-27 ENCOUNTER — Telehealth: Payer: Self-pay | Admitting: Cardiovascular Disease

## 2015-11-27 NOTE — Telephone Encounter (Signed)
lmtcb again

## 2015-11-27 NOTE — Telephone Encounter (Signed)
New message      Pt wants nurse to call regarding his zetia.

## 2015-11-27 NOTE — Telephone Encounter (Signed)
LMTCB

## 2015-11-27 NOTE — Telephone Encounter (Signed)
New message      Pt wants nurse to call him concerning his cholesterol. Please call.

## 2015-11-27 NOTE — Telephone Encounter (Signed)
Spoke with pt, he is going to finish out the 20 tablets of lipitor he currently has and then he is going to stop it. He is not interested in taking the lipitor or zetia. He is not having any problems, just prefers not to take those medications. Will make dr Clifton Jamesmcalhany aware.

## 2015-11-27 NOTE — Telephone Encounter (Signed)
Spoke with pt, See telephone note from 8/15

## 2015-11-27 NOTE — Telephone Encounter (Signed)
LM again.

## 2015-11-27 NOTE — Telephone Encounter (Signed)
Thanks

## 2015-12-03 ENCOUNTER — Other Ambulatory Visit: Payer: Self-pay | Admitting: Endocrinology

## 2015-12-05 ENCOUNTER — Other Ambulatory Visit: Payer: Self-pay | Admitting: Emergency Medicine

## 2015-12-05 MED ORDER — VALSARTAN 80 MG PO TABS: 80.0000 mg | ORAL_TABLET | Freq: Every day | ORAL | 0 refills | Status: DC

## 2015-12-09 ENCOUNTER — Ambulatory Visit (INDEPENDENT_AMBULATORY_CARE_PROVIDER_SITE_OTHER): Payer: Commercial Managed Care - HMO | Admitting: Family Medicine

## 2015-12-09 ENCOUNTER — Encounter: Payer: Self-pay | Admitting: Family Medicine

## 2015-12-09 VITALS — BP 124/80 | HR 78 | Temp 97.6°F | Ht 68.0 in | Wt 192.0 lb

## 2015-12-09 DIAGNOSIS — R3915 Urgency of urination: Secondary | ICD-10-CM

## 2015-12-09 NOTE — Progress Notes (Signed)
Pre visit review using our clinic review tool, if applicable. No additional management support is needed unless otherwise documented below in the visit note. 

## 2015-12-09 NOTE — Patient Instructions (Signed)
Reduce milk and caffeine consumption Follow up for any fever, chills, or burning with urination Will set up Urology referral.

## 2015-12-09 NOTE — Progress Notes (Signed)
Subjective:     Patient ID: William Aguirre, male   DOB: [birth year], 81 y.o.   MRN: [id number]  HPI Patient seen with several month history of urine urgency and frequency. He has some cognitive slowing and history is difficult. He apparently has not had any burning with urination. He has urgency. Denies any slow stream. No recent fevers or chills. No flank pain.  Also has type 2 diabetes with recent poor control. Most of his blood sugars been over 200. He is followed by endocrinology. His wife states he does frequently drink milk at night.  No diuretic use. Also drinks lots of coffee  Past Medical History:  Diagnosis Date  . Coronary artery disease    Moderate LAD, Diagonal, Circumflex disease by cath 2004  . Dementia   . Diabetes mellitus without complication ([facility])   . Hyperlipidemia   . Hypertension    Past Surgical History:  Procedure Laterality Date  . None      reports that he quit smoking about 50 years ago. He quit after 3.00 years of use. He has never used smokeless tobacco. He reports that he does not drink alcohol or use drugs. family history includes Cancer in his sister; Healthy in his daughter and son; Heart disease in his son; Other in his father, mother, and sister. Allergies  Allergen Reactions  . Hydrocodone-Acetaminophen Other (See Comments)     Delusions     Review of Systems  Respiratory: Negative for shortness of breath.   Cardiovascular: Negative for chest pain.  Genitourinary: Positive for frequency and urgency. Negative for decreased urine volume, difficulty urinating, flank pain and hematuria.       Objective:   Physical Exam  Constitutional: He appears well-developed and well-nourished.  Cardiovascular: Normal rate and regular rhythm.   Pulmonary/Chest: Effort normal and breath sounds normal. No respiratory distress. He has no wheezes. He has no rales.  Musculoskeletal:  No flank tenderness       Assessment:     Urine urgency. He is not  describing obstructive symptoms at this point. He was unable to give urine specimen today in office and states he had just gone before arriving    Plan:     -They are requesting urology referral. He apparently has seen local urologist in the past. We will set up. -Discussed gradually reducing caffeine consumption and also reducing milk consumption which could lead to increased urgency and frequency-in addition to excessive carbohydrates which could be exacerbating his diabetes control  Kristian CoveyBruce W Florean Hoobler MD [facility]

## 2015-12-11 ENCOUNTER — Ambulatory Visit: Payer: Commercial Managed Care - HMO | Admitting: Endocrinology

## 2015-12-11 DIAGNOSIS — Z0289 Encounter for other administrative examinations: Secondary | ICD-10-CM

## 2016-01-02 ENCOUNTER — Other Ambulatory Visit: Payer: Self-pay | Admitting: Cardiovascular Disease

## 2016-01-06 ENCOUNTER — Other Ambulatory Visit: Payer: Self-pay | Admitting: Internal Medicine

## 2016-01-17 ENCOUNTER — Telehealth: Payer: Self-pay | Admitting: Internal Medicine

## 2016-01-17 MED ORDER — BISOPROLOL FUMARATE 5 MG PO TABS
5.0000 mg | ORAL_TABLET | Freq: Every day | ORAL | 6 refills | Status: DC
Start: 2016-01-17 — End: 2016-06-04

## 2016-01-17 NOTE — Telephone Encounter (Signed)
Called and spoke with William Aguirre at the pharmacy she stated that the bisoprolol needed refills.  This has been sent to the pharmacy and nothing further is needed.

## 2016-02-03 DIAGNOSIS — N5201 Erectile dysfunction due to arterial insufficiency: Secondary | ICD-10-CM | POA: Diagnosis not present

## 2016-02-03 DIAGNOSIS — N3944 Nocturnal enuresis: Secondary | ICD-10-CM | POA: Diagnosis not present

## 2016-02-03 DIAGNOSIS — R350 Frequency of micturition: Secondary | ICD-10-CM | POA: Diagnosis not present

## 2016-02-03 DIAGNOSIS — N401 Enlarged prostate with lower urinary tract symptoms: Secondary | ICD-10-CM | POA: Diagnosis not present

## 2016-02-10 ENCOUNTER — Telehealth: Payer: Self-pay

## 2016-02-10 NOTE — Telephone Encounter (Signed)
I contacted the patient and left a voicemail advising the patient his appointment is due. Patient requested to call back and schedule his appointment.

## 2016-02-11 DIAGNOSIS — E785 Hyperlipidemia, unspecified: Secondary | ICD-10-CM | POA: Diagnosis not present

## 2016-02-11 DIAGNOSIS — F09 Unspecified mental disorder due to known physiological condition: Secondary | ICD-10-CM | POA: Diagnosis not present

## 2016-02-11 DIAGNOSIS — I10 Essential (primary) hypertension: Secondary | ICD-10-CM | POA: Diagnosis not present

## 2016-02-11 DIAGNOSIS — E119 Type 2 diabetes mellitus without complications: Secondary | ICD-10-CM | POA: Diagnosis not present

## 2016-02-11 DIAGNOSIS — I2510 Atherosclerotic heart disease of native coronary artery without angina pectoris: Secondary | ICD-10-CM | POA: Diagnosis not present

## 2016-02-12 NOTE — Progress Notes (Signed)
Chief Complaint  Patient presents with  . Fatigue    History of Present Illness: 81 yo male with history of CAD, HTN, HLD, DM who is here today for cardiac follow up. He has been followed in the past by Dr. Lia Foyer. His last cath was in 2004 and showed moderate LAD, Diagonal, Circumflex disease with diabetic appearance of vessels. He has been managed medically since then. Exercise stress test 05/31/12 and he exercised for 3 minutes and stopped due to fatigue. He and Dr. Lia Foyer discussed arranging a stress myoview but the patient declined. I saw him in June 2016 and he described chest pain that was worrisome for angina but he once again refused testing. He stopped Zetia in August 2017 because of cost.   He is here today for follow up. He has not been having exertional chest pressure. No SOB. He says he feels great.   Primary Care Provider:  Dorothyann Peng, NP   Past Medical History:  Diagnosis Date  . Coronary artery disease    Moderate LAD, Diagonal, Circumflex disease by cath 2004  . Dementia   . Diabetes mellitus without complication ([place])   . Hyperlipidemia   . Hypertension     Past Surgical History:  Procedure Laterality Date  . None      Current Outpatient Prescriptions  Medication Sig Dispense Refill  . ACCU-CHEK SOFTCLIX LANCETS lancets USE TO TEST BLOOD SUGAR ONCE DAILY 100 each 2  . ASPIRIN LOW DOSE 81 MG EC tablet TAKE 1 TABLET (81MG) BY MOUTH ONCE DAILY 90 tablet 3  . atorvastatin (LIPITOR) 80 MG tablet Take 80 mg by mouth daily.    . bisoprolol (ZEBETA) 5 MG tablet Take 1 tablet (5 mg total) by mouth daily. 30 tablet 6  . Blood Glucose Monitoring Suppl (ACCU-CHEK AVIVA PLUS) W/DEVICE KIT Use to check blood sugar 1 time per day 1 kit 3  . clopidogrel (PLAVIX) 75 MG tablet TAKE 1 TABLET BY MOUTH ONCE DAILY WITH BREAKFAST 90 tablet 3  . glucose blood (ACCU-CHEK AVIVA) test strip Use to check blood sugar 1 time per day. And lancets 1/day 100 each 3  . insulin aspart  (NOVOLOG) 100 UNIT/ML FlexPen Inject 6 Units into the skin 3 (three) times daily with meals. 15 mL 11  . Insulin Syringe-Needle U-100 (INSULIN SYRINGE 1CC/31GX5/16") 31G X 5/16" 1 ML MISC Use to inject insulin 4 times per day 400 each 2  . LANTUS SOLOSTAR 100 UNIT/ML Solostar Pen INJECT 12 UNITS SUBCUTANEOUSLY DAILY AT 10 P.M. 15 mL 11  . Melatonin 3 MG TABS Take 3 mg by mouth at bedtime.    Marland Kitchen omeprazole (PRILOSEC OTC) 20 MG tablet Take 20 mg by mouth daily.    . traMADol (ULTRAM) 50 MG tablet Take 1 tablet (50 mg total) by mouth 2 (two) times daily as needed for severe pain. 180 tablet 0  . ULTICARE MINI PEN NEEDLES 31G X 6 MM MISC USE WITH INSULIN INJECTIONS 4 TIMES DAILY 200 each 2  . valsartan (DIOVAN) 80 MG tablet Take 1 tablet (80 mg total) by mouth daily. 90 tablet 0   No current facility-administered medications for this visit.     Allergies  Allergen Reactions  . Hydrocodone-Acetaminophen Other (See Comments)     Delusions    Social History   Social History  . Marital status: Married    Spouse name: N/A  . Number of children: 4  . Years of education: N/A   Occupational History  . Accountant  Social History Main Topics  . Smoking status: Former Smoker    Years: 3.00    Quit date: 04/13/1965  . Smokeless tobacco: Never Used  . Alcohol use No  . Drug use: No  . Sexual activity: Not on file   Other Topics Concern  . Not on file   Social History Narrative   Lives with wife in a one story home.   Retired Engineer, maintenance (IT).          Family History  Problem Relation Age of Onset  . Other Father     Deceased  . Other Mother     Deceased, 72  . Other Sister     Deceased  . Cancer Sister     Deceased  . Healthy Son     Living  . Heart disease Son     Deceased, 80 from heart valve dysfunction  . Healthy Daughter     Review of Systems:  As stated in the HPI and otherwise negative.   BP 118/74   Pulse 92   Ht 5' 8.5" (1.74 m)   Wt 190 lb 3.2 oz (86.3 kg)   BMI  28.50 kg/m   Physical Examination: General: Well developed, well nourished, NAD  HEENT: OP clear, mucus membranes moist  SKIN: warm, dry. No rashes. Neuro: No focal deficits  Musculoskeletal: Muscle strength 5/5 all ext  Psychiatric: Mood and affect normal  Neck: No JVD, no carotid bruits, no thyromegaly, no lymphadenopathy.  Lungs:Clear bilaterally, no wheezes, rhonci, crackles Cardiovascular: Regular rate and rhythm. No murmurs, gallops or rubs. Abdomen:Soft. Bowel sounds present. Non-tender.  Extremities: No lower extremity edema. Pulses are 2 + in the bilateral DP/PT.  Cardiac cath December 2004: 1. Ventriculography was performed in the RAO projection. Overall systolic  function was mildly reduced. Ejection fraction was calculated at 47% but  appeared to be visually better. There was hypokinesis of the inferobasal  segment. No significant mitral regurgitation was noted.  2. The left main coronary artery was free of critical disease.  3. The left anterior descending artery coursed to the apex. The LAD and its  branch had a fairly typical diabetic appearance with multiple areas of  luminal irregularity. Beyond the origin of the major diagonal branch was  a 40-50% area of focal stenosis that was slightly hypodense that appeared  to be less severe in some views than others. There was also a 30-40%  ostial narrowing of the major diagonal branch. There was moderate  plaquing noted at the apical portion of the LAD as well; critical  stenoses, however, were not noted.  4. The circumflex is a dominant vessel. There is a first marginal branch  that has a typical diabetic appearance and a focal area of about 60%  narrowing. There is also a second marginal branch that bifurcates  distally and again has diffuse luminal irregularities compatible with  diabetes. The A-V circumflex courses to the posterior wall, where it  supplies two posterolateral branches and then is totally occluded,    functioning as a posterior descending branch. In the initial RAO films,  there was evidence of some collateralization of the distal PDA, and this  had an appearance of being somewhat diffusely irregular.  5. The right coronary artery was a nondominant vessel that was free of  critical disease.  CONCLUSIONS:  1. Mild reduction in left ventricular function with an inferobasal wall  motion abnormality but with hypokinesis and not akinesis.  2. Currently normal electrocardiogram.  3. Total occlusion  of the distal circumflex prior to the posterior  descending branch with early collateralization of this vessel.  4. Moderate disease of the left anterior descending and diagonal system.  5. Typical diabetic appearance of the coronary arteries.  Echo 03/15/13: Left ventricle: The cavity size was normal. Wall thickness was normal. Systolic function was vigorous. The estimated ejection fraction was in the range of 65% to 70%. Wall motion was normal; there were no regional wall motion abnormalities. Doppler parameters are consistent with abnormal left ventricular relaxation (grade 1 diastolic dysfunction).   EKG:  EKG is ordered today. The ekg ordered today demonstrates NSR, rate 92 bpm  Recent Labs: 03/26/2015: ALT 15; BUN 11; Creatinine, Ser 1.09; Hemoglobin 14.3; Platelets 160.0; Potassium 4.1; Sodium 140; TSH 2.13   Lipid Panel    Component Value Date/Time   CHOL 195 03/26/2015 1001   TRIG 137.0 03/26/2015 1001   HDL 43.90 03/26/2015 1001   CHOLHDL 4 03/26/2015 1001   VLDL 27.4 03/26/2015 1001   LDLCALC 124 (H) 03/26/2015 1001     Wt Readings from Last 3 Encounters:  02/13/16 190 lb 3.2 oz (86.3 kg)  12/09/15 192 lb (87.1 kg)  09/11/15 200 lb (90.7 kg)     Other studies Reviewed: Additional studies/ records that were reviewed today include:  Review of the above records demonstrates:    Assessment and Plan:   1. CAD without angina: No recent chest pains suggestive of  angina. Continue ASA/Plavix/statin/beta blocker. He will call with worsening chest pain. LVEF normal on echo 03/15/13.  2. Hyperlipidemia: He is taking Lipitor 80 mg daily. He stopped Zetia due to cost. He does not wish to consider any other medications. Lipids followed in primary care.   3. HTN: BP controlled today.  No changes today.   4. Diabetes mellitus: Managed in primary care. He is not sure how well his blood sugar is controlled.    Current medicines are reviewed at length with the patient today.  The patient does not have concerns regarding medicines.  The following changes have been made:  no change  Labs/ tests ordered today include:   Orders Placed This Encounter  Procedures  . EKG 12-Lead    Disposition:   FU with me in 12 months  Signed, Lauree Chandler, MD 02/13/2016 12:34 PM    [address], [place], [place]  [address] Phone: [phone]; Fax: [phone]

## 2016-02-13 ENCOUNTER — Ambulatory Visit (INDEPENDENT_AMBULATORY_CARE_PROVIDER_SITE_OTHER): Payer: Commercial Managed Care - HMO | Admitting: Cardiovascular Disease

## 2016-02-13 ENCOUNTER — Encounter: Payer: Self-pay | Admitting: Cardiovascular Disease

## 2016-02-13 VITALS — BP 118/74 | HR 92 | Ht 68.5 in | Wt 190.2 lb

## 2016-02-13 DIAGNOSIS — I2510 Atherosclerotic heart disease of native coronary artery without angina pectoris: Secondary | ICD-10-CM

## 2016-02-13 DIAGNOSIS — I10 Essential (primary) hypertension: Secondary | ICD-10-CM

## 2016-02-13 DIAGNOSIS — E7800 Pure hypercholesterolemia, unspecified: Secondary | ICD-10-CM

## 2016-02-13 NOTE — Patient Instructions (Signed)
Medication Instructions: Your physician recommends that you continue on your current medications as directed. Please refer to the Current Medication list given to you today.   Labwork: none  Testing/Procedures: none  Follow-Up: Your physician recommends that you schedule a follow-up appointment in: 12 months.  Please call us in August 2018 to scheduled this appointment.     Any Other Special Instructions Will Be Listed Below (If Applicable).     If you need a refill on your cardiac medications before your next appointment, please call your pharmacy.

## 2016-02-14 ENCOUNTER — Encounter: Payer: Self-pay | Admitting: Endocrinology

## 2016-02-14 ENCOUNTER — Ambulatory Visit (INDEPENDENT_AMBULATORY_CARE_PROVIDER_SITE_OTHER): Payer: Commercial Managed Care - HMO | Admitting: Endocrinology

## 2016-02-14 VITALS — BP 122/80 | HR 73 | Ht 68.0 in | Wt 188.0 lb

## 2016-02-14 DIAGNOSIS — E119 Type 2 diabetes mellitus without complications: Secondary | ICD-10-CM

## 2016-02-14 LAB — POCT GLYCOSYLATED HEMOGLOBIN (HGB A1C): HEMOGLOBIN A1C: 14.1

## 2016-02-14 MED ORDER — INSULIN GLARGINE 100 UNIT/ML SOLOSTAR PEN: 30.0000 [IU] | PEN_INJECTOR | SUBCUTANEOUS | 11 refills | Status: DC

## 2016-02-14 NOTE — Patient Instructions (Addendum)
check your blood sugar 4 times a day: before the 3 meals, and at bedtime.  also check if you have symptoms of your blood sugar being too high or too low.  please keep a record of the readings and bring it to your next appointment here.  You can write it on any piece of paper.  please call us sooner if your blood sugar goes below 70, or if you have a lot of readings over 200.   Please stop taking the novolog, and:  Increase the lantus to 30 units each morning.  Please come back for a follow-up appointment in 2-3 weeks.         Type 2 Diabetes Mellitus, Adult Type 2 diabetes mellitus, often simply referred to as type 2 diabetes, is a long-lasting (chronic) disease. In type 2 diabetes, the pancreas does not make enough insulin (a hormone), the cells are less responsive to the insulin that is made (insulin resistance), or both. Normally, insulin moves sugars from food into the tissue cells. The tissue cells use the sugars for energy. The lack of insulin or the lack of normal response to insulin causes excess sugars to build up in the blood instead of going into the tissue cells. As a result, high blood sugar (hyperglycemia) develops. The effect of high sugar (glucose) levels can cause many complications. Type 2 diabetes was also previously called adult-onset diabetes, but it can occur at any age.  RISK FACTORS  A person is predisposed to developing type 2 diabetes if someone in the family has the disease and also has one or more of the following primary risk factors:  Weight gain, or being overweight or obese.  An inactive lifestyle.  A history of consistently eating high-calorie foods. Maintaining a normal weight and regular physical activity can reduce the chance of developing type 2 diabetes. SYMPTOMS  A person with type 2 diabetes may not show symptoms initially. The symptoms of type 2 diabetes appear slowly. The symptoms include:  Increased thirst (polydipsia).  Increased urination  (polyuria).  Increased urination during the night (nocturia).  Sudden or unexplained weight changes.  Frequent, recurring infections.  Tiredness (fatigue).  Weakness.  Vision changes, such as blurred vision.  Fruity smell to your breath.  Abdominal pain.  Nausea or vomiting.  Cuts or bruises which are slow to heal.  Tingling or numbness in the hands or feet.  An open skin wound (ulcer). DIAGNOSIS Type 2 diabetes is frequently not diagnosed until complications of diabetes are present. Type 2 diabetes is diagnosed when symptoms or complications are present and when blood glucose levels are increased. Your blood glucose level may be checked by one or more of the following blood tests:  A fasting blood glucose test. You will not be allowed to eat for at least 8 hours before a blood sample is taken.  A random blood glucose test. Your blood glucose is checked at any time of the day regardless of when you ate.  A hemoglobin A1c blood glucose test. A hemoglobin A1c test provides information about blood glucose control over the previous 3 months.  An oral glucose tolerance test (OGTT). Your blood glucose is measured after you have not eaten (fasted) for 2 hours and then after you drink a glucose-containing beverage. TREATMENT   You may need to take insulin or diabetes medicine daily to keep blood glucose levels in the desired range.  If you use insulin, you may need to adjust the dosage depending on the carbohydrates that  you eat with each meal or snack.  Lifestyle changes are recommended as part of your treatment. These may include:  Following an individualized diet plan developed by a nutritionist or dietitian.  Exercising daily. Your health care providers will set individualized treatment goals for you based on your age, your medicines, how long you have had diabetes, and any other medical conditions you have. Generally, the goal of treatment is to maintain the following blood  glucose levels:  Before meals (preprandial): 80-130 mg/dL.  After meals (postprandial): below 180 mg/dL.  A1c: less than 6.5-7%. HOME CARE INSTRUCTIONS   Have your hemoglobin A1c level checked twice a year.  Perform daily blood glucose monitoring as directed by your health care provider.  Monitor urine ketones when you are ill and as directed by your health care provider.  Take your diabetes medicine or insulin as directed by your health care provider to maintain your blood glucose levels in the desired range.  Never run out of diabetes medicine or insulin. It is needed every day.  If you are using insulin, you may need to adjust the amount of insulin given based on your intake of carbohydrates. Carbohydrates can raise blood glucose levels but need to be included in your diet. Carbohydrates provide vitamins, minerals, and fiber which are an essential part of a healthy diet. Carbohydrates are found in fruits, vegetables, whole grains, dairy products, legumes, and foods containing added sugars.  Eat healthy foods. You should make an appointment to see a registered dietitian to help you create an eating plan that is right for you.  Lose weight if you are overweight.  Carry a medical alert card or wear your medical alert jewelry.  Carry a 15-gram carbohydrate snack with you at all times to treat low blood glucose (hypoglycemia). Some examples of 15-gram carbohydrate snacks include:  Glucose tablets, 3 or 4.  Glucose gel, 15-gram tube.  Raisins, 2 tablespoons (24 grams).  Jelly beans, 6.  Animal crackers, 8.  Regular pop, 4 ounces (120 mL).  Gummy treats, 9.  Recognize hypoglycemia. Hypoglycemia occurs with blood glucose levels of 70 mg/dL and below. The risk for hypoglycemia increases when fasting or skipping meals, during or after intense exercise, and during sleep. Hypoglycemia symptoms can include:  Tremors or shakes.  Decreased ability to  concentrate.  Sweating.  Increased heart rate.  Headache.  Dry mouth.  Hunger.  Irritability.  Anxiety.  Restless sleep.  Altered speech or coordination.  Confusion.  Treat hypoglycemia promptly. If you are alert and able to safely swallow, follow the 15:15 rule:  Take 15-20 grams of rapid-acting glucose or carbohydrate. Rapid-acting options include glucose gel, glucose tablets, or 4 ounces (120 mL) of fruit juice, regular soda, or low-fat milk.  Check your blood glucose level 15 minutes after taking the glucose.  Take 15-20 grams more of glucose if the repeat blood glucose level is still 70 mg/dL or below.  Eat a meal or snack within 1 hour once blood glucose levels return to normal.  Be alert to feeling very thirsty and urinating more frequently than usual, which are early signs of hyperglycemia. An early awareness of hyperglycemia allows for prompt treatment. Treat hyperglycemia as directed by your health care provider.  Engage in at least 150 minutes of moderate-intensity physical activity a week, spread over at least 3 days of the week or as directed by your health care provider. In addition, you should engage in resistance exercise at least 2 times a week or as directed  by your health care provider. Try to spend no more than 90 minutes at one time inactive.  Adjust your medicine and food intake as needed if you start a new exercise or sport.  Follow your sick-day plan anytime you are unable to eat or drink as usual.  Do not use any tobacco products including cigarettes, chewing tobacco, or electronic cigarettes. If you need help quitting, ask your health care provider.  Limit alcohol intake to no more than 1 drink per day for nonpregnant women and 2 drinks per day for men. You should drink alcohol only when you are also eating food. Talk with your health care provider whether alcohol is safe for you. Tell your health care provider if you drink alcohol several times a  week.  Keep all follow-up visits as directed by your health care provider. This is important.  Schedule an eye exam soon after the diagnosis of type 2 diabetes and then annually.  Perform daily skin and foot care. Examine your skin and feet daily for cuts, bruises, redness, nail problems, bleeding, blisters, or sores. A foot exam by a health care provider should be done annually.  Brush your teeth and gums at least twice a day and floss at least once a day. Follow up with your dentist regularly.  Share your diabetes management plan with your workplace or school.  Keep your immunizations up to date. It is recommended that you receive a flu (influenza) vaccine every year. It is also recommended that you receive a pneumonia (pneumococcal) vaccine. If you are 59 years of age or older and have never received a pneumonia vaccine, this vaccine may be given as a series of two separate shots. Ask your health care provider which additional vaccines may be recommended.  Learn to manage stress.  Obtain ongoing diabetes education and support as needed.  Participate in or seek rehabilitation as needed to maintain or improve independence and quality of life. Request a physical or occupational therapy referral if you are having foot or hand numbness, or difficulties with grooming, dressing, eating, or physical activity. SEEK MEDICAL CARE IF:   You are unable to eat food or drink fluids for more than 6 hours.  You have nausea and vomiting for more than 6 hours.  Your blood glucose level is over 240 mg/dL.  There is a change in mental status.  You develop an additional serious illness.  You have diarrhea for more than 6 hours.  You have been sick or have had a fever for a couple of days and are not getting better.  You have pain during any physical activity.  SEEK IMMEDIATE MEDICAL CARE IF:  You have difficulty breathing.  You have moderate to large ketone levels.   This information is not  intended to replace advice given to you by your health care provider. Make sure you discuss any questions you have with your health care provider.   Document Released: 03/30/2005 Document Revised: 12/19/2014 Document Reviewed: 10/27/2011 Elsevier Interactive Patient Education 2016 [employer]. How to Avoid Diabetes Problems You can do a lot to prevent or slow down diabetes problems. Following your diabetes plan and taking care of yourself can reduce your risk of serious or life-threatening complications. Below, you will find certain things you can do to prevent diabetes problems. MANAGE YOUR DIABETES Follow your health care provider's, nurse educator's, and dietitian's instructions for managing your diabetes. They will teach you the basics of diabetes care. They can help answer questions you may have.  Learn about diabetes and make healthy choices regarding eating and physical activity. Monitor your blood glucose level regularly. Your health care provider will help you decide how often to check your blood glucose level depending on your treatment goals and how well you are meeting them.  DO NOT USE NICOTINE Nicotine and diabetes are a dangerous combination. Nicotine raises your risk for diabetes problems. If you quit using nicotine, you will lower your risk for heart attack, stroke, nerve disease, and kidney disease. Your cholesterol and your blood pressure levels may improve. Your blood circulation will also improve. Do not use any tobacco products, including cigarettes, chewing tobacco, or electronic cigarettes. If you need help quitting, ask your health care provider. KEEP YOUR BLOOD PRESSURE UNDER CONTROL Your health care provider will determine your individualized target blood pressure based on your age, your medicines, how long you have had diabetes, and any other medical conditions you have. Blood pressure consists of two numbers. Generally, the goal is to keep your top number (systolic pressure)  at or below 130, and your bottom number (diastolic pressure) at or below 80. Your health care provider may recommend a lower target blood pressure reading, if appropriate. Meal planning, medicines, and exercise can help you reach your target blood pressure. Make sure your health care provider checks your blood pressure at every visit. KEEP YOUR CHOLESTEROL UNDER CONTROL Normal cholesterol levels will help prevent heart disease and stroke. These are the biggest health problems for people with diabetes. Keeping cholesterol levels under control can also help with blood flow. Have your cholesterol level checked at least once a year. Your health care provider may prescribe a medicine known as a statin. Statins lower your cholesterol. If you are not taking a statin, ask your health care provider if you should be. Meal planning, exercise, and medicines can help you reach your cholesterol targets.  SCHEDULE AND KEEP YOUR ANNUAL PHYSICAL EXAMS AND EYE EXAMS Your health care provider will tell you how often he or she wants to see you depending on your plan of treatment. It is important that you keep these appointments so that possible problems can be identified early and complications can be avoided or treated.  Every visit with your health care provider should include your weight, blood pressure, and an evaluation of your blood glucose control.  Your hemoglobin A1c should be checked:  At least twice a year if you are at your goal.  Every 3 months if there are changes in treatment.  If you are not meeting your goals.  Your blood lipids should be checked yearly. You should also be checked yearly to see if you have protein in your urine (microalbumin).  Schedule a dilated eye exam within 5 years of your diagnosis if you have type 1 diabetes, and then yearly. Schedule a dilated eye exam at diagnosis if you have type 2 diabetes, and then yearly. All exams thereafter can be extended to every 2 to 3 years if one  or more exams have been normal. KEEP YOUR VACCINES CURRENT It is recommended that you receive a flu (influenza) vaccine every year. It is also recommended that you receive a pneumonia (pneumococcal) vaccine. If you are 12 years of age or older and have never received a pneumonia vaccine, this vaccine may be given as a series of two separate shots. Ask your health care provider which additional vaccines may be recommended. TAKE CARE OF YOUR FEET  Diabetes may cause you to have a poor blood supply (circulation)  to your legs and feet. Because of this, the skin may be thinner, break easier, and heal more slowly. You also may have nerve damage in your legs and feet, causing decreased feeling. You may not notice minor injuries to your feet that could lead to serious problems or infections. Taking care of your feet is very important. Visual foot exams are performed at every routine medical visit. The exams check for cuts, injuries, or other problems with the feet. A comprehensive foot exam should be done yearly. This includes visual inspection as well as assessing foot pulses and testing for loss of sensation. You should also do the following:  Inspect your feet daily for cuts, calluses, blisters, ingrown toenails, and signs of infection, such as redness, swelling, or pus.  Wash and dry your feet thoroughly, especially between the toes.  Avoid soaking your feet regularly in hot water baths.  Moisturize dry skin with lotion, avoiding areas between your toes.  Cut toenails straight across and file the edges.  Avoid shoes that do not fit well or have areas that irritate your skin.  Avoid going barefooted or wearing only socks. Your feet need protection. TAKE CARE OF YOUR TEETH People with poorly controlled diabetes are more likely to have gum (periodontal) disease. These infections make diabetes harder to control. Periodontal diseases, if left untreated, can lead to tooth loss. Brush your teeth twice a  day, floss, and see your dentist for checkups and cleaning every 6 months, or 2 times a year. ASK YOUR HEALTH CARE PROVIDER ABOUT TAKING ASPIRIN Taking aspirin daily is recommended to help prevent cardiovascular disease in people with and without diabetes. Ask your health care provider if this would benefit you and what dose he or she would recommend. DRINK RESPONSIBLY Moderate amounts of alcohol (less than 1 drink per day for adult women and less than 2 drinks per day for adult men) have a minimal effect on blood glucose if ingested with food. It is important to eat food with alcohol to avoid hypoglycemia. People should avoid alcohol if they have a history of alcohol abuse or dependence, if they are pregnant, and if they have liver disease, pancreatitis, advanced neuropathy, or severe hypertriglyceridemia. LESSEN STRESS Living with diabetes can be stressful. When you are under stress, your blood glucose may be affected in two ways:  Stress hormones may cause your blood glucose to rise.  You may be distracted from taking good care of yourself. It is a good idea to be aware of your stress level and make changes that are necessary to help you better manage challenging situations. Support groups, planned relaxation, a hobby you enjoy, meditation, healthy relationships, and exercise all work to lower your stress level. If your efforts do not seem to be helping, get help from your health care provider or a trained mental health professional.   This information is not intended to replace advice given to you by your health care provider. Make sure you discuss any questions you have with your health care provider.   Document Released: 12/16/2010 Document Revised: 04/20/2014 Document Reviewed: 05/24/2013 Elsevier Interactive Patient Education 2016 [employer] Vegetarian Eating and Nutrition Vegetarian diets are designed for those people who prefer vegetarian eating for religious, ecologic, or personal  reasons. These diets, which are often lower in cholesterol and saturated fats, can provide significant health benefits. They result in lower rates of obesity, diabetes, breast and colon cancers, and cardiovascular and gallbladder diseases.  There are several types of vegetarian diets, but  all restrict animal proteins to some degree. Because animal proteins have important nutrients, vegetarians should make sure to get these nutrients from other types of foods. The main purpose of healthy vegetarian eating is to provide the energy, vitamins, and minerals for proper growth and health maintenance at any age.  WHAT DO I NEED TO KNOW ABOUT VEGETARIAN EATING AND NUTRITION? The following nutrients are found in animal products. It is important to make sure you get enough of these nutrients from your diet. If you think you are not getting the right nutrients or if you do not eat any animal products, talk with your health care provider or dietitian about taking supplements. Protein Your dietitian can help you determine your individual protein needs. Sources of protein include:  Beans, such as black beans or kidney beans, or other legumes, such as lentils and split peas. Soy products. Nuts, such as almonds, [place] nuts, and pecans. Seeds, such as sunflower seeds. Tofu, tempeh, and hummus. Eggs, milk, and cheese. Vitamin B12 This vitamin is only found in:  Cheeses, fish, and eggs. It can also be found in some breakfast cereals and other prepared products that have added vitamin B12. If you eat no animal products, you should discuss taking a supplement with your health care provider or dietitian. Vitamin D Good sources of vitamin D include:  Cod liver oil. Fish, such as swordfish, salmon, and tuna. Dairy products. Orange juice. Fortified mushrooms. Cereals with added vitamin D. Iron Good sources of iron include:  Dark, leafy greens. Nuts. Beans. Grain products that have added iron, such as cereals. Tofu,  tempeh, soybeans, and quinoa. Plant-based iron is better absorbed when eaten with vitamin C. For example, squeeze lemon juice over cooked greens like kale, chard, or spinach, or have a glass of orange juice with your meals. Omega-3 Fatty Acids Good sources of omega-3 fatty acids include:  Walnuts. Foods with added omega-3 fatty acids, such as eggs, milk, and juices. Flax seeds, canola oil, soybean oil, and tofu. Fish (cold water fatty fish such as salmon, sardines, mackerel, herring, lake trout, and albacore tuna). Calcium Good sources of calcium include:  Dark, leafy greens, such as kale, bok choy, Chinese cabbage, collard greens and mustard greens. Broccoli. Okra. Dairy products. Soy products with added calcium. Calcium-fortified breakfast cereals, calcium-fortified fruit juices, and figs. Zinc Good sources of zinc include:  Legumes. Dairy products. Wheat germ, cereals, and breads that have added zinc. Baked beans. Legumes, such as cashews, chickpeas, kidney beans, and green peas. Almonds and nut butters. Tofu and other soy products.   This information is not intended to replace advice given to you by your health care provider. Make sure you discuss any questions you have with your health care provider.   Document Released: 04/02/2003 Document Revised: 04/20/2014 Document Reviewed: 02/02/2013 Elsevier Interactive Patient Education [employer]vier Inc.

## 2016-02-14 NOTE — Progress Notes (Signed)
Subjective:    Patient ID: William Aguirre, male    DOB: 15-Sep-1934, 81 y.o.   MRN: [id number]  HPI Pt returns for f/u of diabetes mellitus: DM type: Insulin-requiring type 2.   Dx'ed: 1986 Complications: CAD and polyneuropathy Therapy: insulin since soon after dx.  DKA: never Severe hypoglycemia: never Pancreatitis: never Other: he takes multiple daily injections.  Interval history:  Wife provides hx, as pt has memory loss.  pt states he feels well in general.  she says he frequently misses insulin injections.  Meter is downloaded today, and the printout is scanned into the record.  There is only 1 cbg recorded.   Past Medical History:  Diagnosis Date  . Coronary artery disease    Moderate LAD, Diagonal, Circumflex disease by cath 2004  . Dementia   . Diabetes mellitus without complication (HCC)   . Hyperlipidemia   . Hypertension     Past Surgical History:  Procedure Laterality Date  . None      Social History   Social History  . Marital status: Married    Spouse name: N/A  . Number of children: 4  . Years of education: N/A   Occupational History  . Accountant    Social History Main Topics  . Smoking status: Former Smoker    Years: 3.00    Quit date: 04/13/1965  . Smokeless tobacco: Never Used  . Alcohol use No  . Drug use: No  . Sexual activity: Not on file   Other Topics Concern  . Not on file   Social History Narrative   Lives with wife in a one story home.   Retired IT trainer.          Current Outpatient Prescriptions on File Prior to Visit  Medication Sig Dispense Refill  . ACCU-CHEK SOFTCLIX LANCETS lancets USE TO TEST BLOOD SUGAR ONCE DAILY 100 each 2  . ASPIRIN LOW DOSE 81 MG EC tablet TAKE 1 TABLET (81MG ) BY MOUTH ONCE DAILY 90 tablet 3  . atorvastatin (LIPITOR) 80 MG tablet Take 80 mg by mouth daily.    . bisoprolol (ZEBETA) 5 MG tablet Take 1 tablet (5 mg total) by mouth daily. 30 tablet 6  . clopidogrel (PLAVIX) 75 MG tablet TAKE 1 TABLET BY  MOUTH ONCE DAILY WITH BREAKFAST 90 tablet 3  . glucose blood (ACCU-CHEK AVIVA) test strip Use to check blood sugar 1 time per day. And lancets 1/day 100 each 3  . Melatonin 3 MG TABS Take 3 mg by mouth at bedtime.    Marland Kitchen omeprazole (PRILOSEC OTC) 20 MG tablet Take 20 mg by mouth daily.    . traMADol (ULTRAM) 50 MG tablet Take 1 tablet (50 mg total) by mouth 2 (two) times daily as needed for severe pain. 180 tablet 0  . valsartan (DIOVAN) 80 MG tablet Take 1 tablet (80 mg total) by mouth daily. 90 tablet 0   No current facility-administered medications on file prior to visit.     Allergies  Allergen Reactions  . Hydrocodone-Acetaminophen Other (See Comments)     Delusions    Family History  Problem Relation Age of Onset  . Other Father     Deceased  . Other Mother     Deceased, 33  . Other Sister     Deceased  . Cancer Sister     Deceased  . Healthy Son     Living  . Heart disease Son     Deceased, 30 from heart valve dysfunction  .  Healthy Daughter     BP 122/80   Pulse 73   Ht 5\' 8"  (1.727 m)   Wt 188 lb (85.3 kg)   SpO2 97%   BMI 28.59 kg/m   Review of Systems He denies hypoglycemia.  He has lost weight.      Objective:   Physical Exam VITAL SIGNS:  See vs page GENERAL: no distress Pulses: dorsalis pedis intact bilat.   MSK: no deformity of the feet CV: no leg edema Skin:  no ulcer on the feet.  normal color and temp on the feet. Neuro: sensation is intact to touch on the feet, but decreased from normal.   A1c=14%    Assessment & Plan:  Noncompliance with cbg recording and insulin, worse.  Insulin-requiring type 2 DM with CAD: severe exacerbation.  memory loss: he needs a simpler insulin regimen.  Today, he agrees.    Patient is advised the following: Patient Instructions  check your blood sugar 4 times a day: before the 3 meals, and at bedtime.  also check if you have symptoms of your blood sugar being too high or too low.  please keep a record of the  readings and bring it to your next appointment here.  You can write it on any piece of paper.  please call us sooner if your blood sugar goes below 70, or if you have a lot of readings over 200.   Please stop taking the novolog, and:  Increase the lantus to 30 units each morning.  Please come back for a follow-up appointment in 2-3 weeks.         Type 2 Diabetes Mellitus, Adult Type 2 diabetes mellitus, often simply referred to as type 2 diabetes, is a long-lasting (chronic) disease. In type 2 diabetes, the pancreas does not make enough insulin (a hormone), the cells are less responsive to the insulin that is made (insulin resistance), or both. Normally, insulin moves sugars from food into the tissue cells. The tissue cells use the sugars for energy. The lack of insulin or the lack of normal response to insulin causes excess sugars to build up in the blood instead of going into the tissue cells. As a result, high blood sugar (hyperglycemia) develops. The effect of high sugar (glucose) levels can cause many complications. Type 2 diabetes was also previously called adult-onset diabetes, but it can occur at any age.  RISK FACTORS  A person is predisposed to developing type 2 diabetes if someone in the family has the disease and also has one or more of the following primary risk factors:  Weight gain, or being overweight or obese.  An inactive lifestyle.  A history of consistently eating high-calorie foods. Maintaining a normal weight and regular physical activity can reduce the chance of developing type 2 diabetes. SYMPTOMS  A person with type 2 diabetes may not show symptoms initially. The symptoms of type 2 diabetes appear slowly. The symptoms include:  Increased thirst (polydipsia).  Increased urination (polyuria).  Increased urination during the night (nocturia).  Sudden or unexplained weight changes.  Frequent, recurring infections.  Tiredness (fatigue).  Weakness.  Vision  changes, such as blurred vision.  Fruity smell to your breath.  Abdominal pain.  Nausea or vomiting.  Cuts or bruises which are slow to heal.  Tingling or numbness in the hands or feet.  An open skin wound (ulcer). DIAGNOSIS Type 2 diabetes is frequently not diagnosed until complications of diabetes are present. Type 2 diabetes is diagnosed when symptoms  or complications are present and when blood glucose levels are increased. Your blood glucose level may be checked by one or more of the following blood tests:  A fasting blood glucose test. You will not be allowed to eat for at least 8 hours before a blood sample is taken.  A random blood glucose test. Your blood glucose is checked at any time of the day regardless of when you ate.  A hemoglobin A1c blood glucose test. A hemoglobin A1c test provides information about blood glucose control over the previous 3 months.  An oral glucose tolerance test (OGTT). Your blood glucose is measured after you have not eaten (fasted) for 2 hours and then after you drink a glucose-containing beverage. TREATMENT   You may need to take insulin or diabetes medicine daily to keep blood glucose levels in the desired range.  If you use insulin, you may need to adjust the dosage depending on the carbohydrates that you eat with each meal or snack.  Lifestyle changes are recommended as part of your treatment. These may include:  Following an individualized diet plan developed by a nutritionist or dietitian.  Exercising daily. Your health care providers will set individualized treatment goals for you based on your age, your medicines, how long you have had diabetes, and any other medical conditions you have. Generally, the goal of treatment is to maintain the following blood glucose levels:  Before meals (preprandial): 80-130 mg/dL.  After meals (postprandial): below 180 mg/dL.  A1c: less than 6.5-7%. HOME CARE INSTRUCTIONS   Have your hemoglobin  A1c level checked twice a year.  Perform daily blood glucose monitoring as directed by your health care provider.  Monitor urine ketones when you are ill and as directed by your health care provider.  Take your diabetes medicine or insulin as directed by your health care provider to maintain your blood glucose levels in the desired range.  Never run out of diabetes medicine or insulin. It is needed every day.  If you are using insulin, you may need to adjust the amount of insulin given based on your intake of carbohydrates. Carbohydrates can raise blood glucose levels but need to be included in your diet. Carbohydrates provide vitamins, minerals, and fiber which are an essential part of a healthy diet. Carbohydrates are found in fruits, vegetables, whole grains, dairy products, legumes, and foods containing added sugars.  Eat healthy foods. You should make an appointment to see a registered dietitian to help you create an eating plan that is right for you.  Lose weight if you are overweight.  Carry a medical alert card or wear your medical alert jewelry.  Carry a 15-gram carbohydrate snack with you at all times to treat low blood glucose (hypoglycemia). Some examples of 15-gram carbohydrate snacks include:  Glucose tablets, 3 or 4.  Glucose gel, 15-gram tube.  Raisins, 2 tablespoons (24 grams).  Jelly beans, 6.  Animal crackers, 8.  Regular pop, 4 ounces (120 mL).  Gummy treats, 9.  Recognize hypoglycemia. Hypoglycemia occurs with blood glucose levels of 70 mg/dL and below. The risk for hypoglycemia increases when fasting or skipping meals, during or after intense exercise, and during sleep. Hypoglycemia symptoms can include:  Tremors or shakes.  Decreased ability to concentrate.  Sweating.  Increased heart rate.  Headache.  Dry mouth.  Hunger.  Irritability.  Anxiety.  Restless sleep.  Altered speech or coordination.  Confusion.  Treat hypoglycemia  promptly. If you are alert and able to safely swallow, follow  the 15:15 rule:  Take 15-20 grams of rapid-acting glucose or carbohydrate. Rapid-acting options include glucose gel, glucose tablets, or 4 ounces (120 mL) of fruit juice, regular soda, or low-fat milk.  Check your blood glucose level 15 minutes after taking the glucose.  Take 15-20 grams more of glucose if the repeat blood glucose level is still 70 mg/dL or below.  Eat a meal or snack within 1 hour once blood glucose levels return to normal.  Be alert to feeling very thirsty and urinating more frequently than usual, which are early signs of hyperglycemia. An early awareness of hyperglycemia allows for prompt treatment. Treat hyperglycemia as directed by your health care provider.  Engage in at least 150 minutes of moderate-intensity physical activity a week, spread over at least 3 days of the week or as directed by your health care provider. In addition, you should engage in resistance exercise at least 2 times a week or as directed by your health care provider. Try to spend no more than 90 minutes at one time inactive.  Adjust your medicine and food intake as needed if you start a new exercise or sport.  Follow your sick-day plan anytime you are unable to eat or drink as usual.  Do not use any tobacco products including cigarettes, chewing tobacco, or electronic cigarettes. If you need help quitting, ask your health care provider.  Limit alcohol intake to no more than 1 drink per day for nonpregnant women and 2 drinks per day for men. You should drink alcohol only when you are also eating food. Talk with your health care provider whether alcohol is safe for you. Tell your health care provider if you drink alcohol several times a week.  Keep all follow-up visits as directed by your health care provider. This is important.  Schedule an eye exam soon after the diagnosis of type 2 diabetes and then annually.  Perform daily skin and  foot care. Examine your skin and feet daily for cuts, bruises, redness, nail problems, bleeding, blisters, or sores. A foot exam by a health care provider should be done annually.  Brush your teeth and gums at least twice a day and floss at least once a day. Follow up with your dentist regularly.  Share your diabetes management plan with your workplace or school.  Keep your immunizations up to date. It is recommended that you receive a flu (influenza) vaccine every year. It is also recommended that you receive a pneumonia (pneumococcal) vaccine. If you are 74 years of age or older and have never received a pneumonia vaccine, this vaccine may be given as a series of two separate shots. Ask your health care provider which additional vaccines may be recommended.  Learn to manage stress.  Obtain ongoing diabetes education and support as needed.  Participate in or seek rehabilitation as needed to maintain or improve independence and quality of life. Request a physical or occupational therapy referral if you are having foot or hand numbness, or difficulties with grooming, dressing, eating, or physical activity. SEEK MEDICAL CARE IF:   You are unable to eat food or drink fluids for more than 6 hours.  You have nausea and vomiting for more than 6 hours.  Your blood glucose level is over 240 mg/dL.  There is a change in mental status.  You develop an additional serious illness.  You have diarrhea for more than 6 hours.  You have been sick or have had a fever for a couple of days and  are not getting better.  You have pain during any physical activity.  SEEK IMMEDIATE MEDICAL CARE IF:  You have difficulty breathing.  You have moderate to large ketone levels.   This information is not intended to replace advice given to you by your health care provider. Make sure you discuss any questions you have with your health care provider.   Document Released: 03/30/2005 Document Revised: 12/19/2014  Document Reviewed: 10/27/2011 Elsevier Interactive Patient Education 2016 [employer] How to Avoid Diabetes Problems You can do a lot to prevent or slow down diabetes problems. Following your diabetes plan and taking care of yourself can reduce your risk of serious or life-threatening complications. Below, you will find certain things you can do to prevent diabetes problems. MANAGE YOUR DIABETES Follow your health care provider's, nurse educator's, and dietitian's instructions for managing your diabetes. They will teach you the basics of diabetes care. They can help answer questions you may have. Learn about diabetes and make healthy choices regarding eating and physical activity. Monitor your blood glucose level regularly. Your health care provider will help you decide how often to check your blood glucose level depending on your treatment goals and how well you are meeting them.  DO NOT USE NICOTINE Nicotine and diabetes are a dangerous combination. Nicotine raises your risk for diabetes problems. If you quit using nicotine, you will lower your risk for heart attack, stroke, nerve disease, and kidney disease. Your cholesterol and your blood pressure levels may improve. Your blood circulation will also improve. Do not use any tobacco products, including cigarettes, chewing tobacco, or electronic cigarettes. If you need help quitting, ask your health care provider. KEEP YOUR BLOOD PRESSURE UNDER CONTROL Your health care provider will determine your individualized target blood pressure based on your age, your medicines, how long you have had diabetes, and any other medical conditions you have. Blood pressure consists of two numbers. Generally, the goal is to keep your top number (systolic pressure) at or below 130, and your bottom number (diastolic pressure) at or below 80. Your health care provider may recommend a lower target blood pressure reading, if appropriate. Meal planning, medicines, and exercise  can help you reach your target blood pressure. Make sure your health care provider checks your blood pressure at every visit. KEEP YOUR CHOLESTEROL UNDER CONTROL Normal cholesterol levels will help prevent heart disease and stroke. These are the biggest health problems for people with diabetes. Keeping cholesterol levels under control can also help with blood flow. Have your cholesterol level checked at least once a year. Your health care provider may prescribe a medicine known as a statin. Statins lower your cholesterol. If you are not taking a statin, ask your health care provider if you should be. Meal planning, exercise, and medicines can help you reach your cholesterol targets.  SCHEDULE AND KEEP YOUR ANNUAL PHYSICAL EXAMS AND EYE EXAMS Your health care provider will tell you how often he or she wants to see you depending on your plan of treatment. It is important that you keep these appointments so that possible problems can be identified early and complications can be avoided or treated.  Every visit with your health care provider should include your weight, blood pressure, and an evaluation of your blood glucose control.  Your hemoglobin A1c should be checked:  At least twice a year if you are at your goal.  Every 3 months if there are changes in treatment.  If you are not meeting your goals.  Your  blood lipids should be checked yearly. You should also be checked yearly to see if you have protein in your urine (microalbumin).  Schedule a dilated eye exam within 5 years of your diagnosis if you have type 1 diabetes, and then yearly. Schedule a dilated eye exam at diagnosis if you have type 2 diabetes, and then yearly. All exams thereafter can be extended to every 2 to 3 years if one or more exams have been normal. KEEP YOUR VACCINES CURRENT It is recommended that you receive a flu (influenza) vaccine every year. It is also recommended that you receive a pneumonia (pneumococcal) vaccine. If  you are 74 years of age or older and have never received a pneumonia vaccine, this vaccine may be given as a series of two separate shots. Ask your health care provider which additional vaccines may be recommended. TAKE CARE OF YOUR FEET  Diabetes may cause you to have a poor blood supply (circulation) to your legs and feet. Because of this, the skin may be thinner, break easier, and heal more slowly. You also may have nerve damage in your legs and feet, causing decreased feeling. You may not notice minor injuries to your feet that could lead to serious problems or infections. Taking care of your feet is very important. Visual foot exams are performed at every routine medical visit. The exams check for cuts, injuries, or other problems with the feet. A comprehensive foot exam should be done yearly. This includes visual inspection as well as assessing foot pulses and testing for loss of sensation. You should also do the following:  Inspect your feet daily for cuts, calluses, blisters, ingrown toenails, and signs of infection, such as redness, swelling, or pus.  Wash and dry your feet thoroughly, especially between the toes.  Avoid soaking your feet regularly in hot water baths.  Moisturize dry skin with lotion, avoiding areas between your toes.  Cut toenails straight across and file the edges.  Avoid shoes that do not fit well or have areas that irritate your skin.  Avoid going barefooted or wearing only socks. Your feet need protection. TAKE CARE OF YOUR TEETH People with poorly controlled diabetes are more likely to have gum (periodontal) disease. These infections make diabetes harder to control. Periodontal diseases, if left untreated, can lead to tooth loss. Brush your teeth twice a day, floss, and see your dentist for checkups and cleaning every 6 months, or 2 times a year. ASK YOUR HEALTH CARE PROVIDER ABOUT TAKING ASPIRIN Taking aspirin daily is recommended to help prevent cardiovascular  disease in people with and without diabetes. Ask your health care provider if this would benefit you and what dose he or she would recommend. DRINK RESPONSIBLY Moderate amounts of alcohol (less than 1 drink per day for adult women and less than 2 drinks per day for adult men) have a minimal effect on blood glucose if ingested with food. It is important to eat food with alcohol to avoid hypoglycemia. People should avoid alcohol if they have a history of alcohol abuse or dependence, if they are pregnant, and if they have liver disease, pancreatitis, advanced neuropathy, or severe hypertriglyceridemia. LESSEN STRESS Living with diabetes can be stressful. When you are under stress, your blood glucose may be affected in two ways:  Stress hormones may cause your blood glucose to rise.  You may be distracted from taking good care of yourself. It is a good idea to be aware of your stress level and make changes that are  necessary to help you better manage challenging situations. Support groups, planned relaxation, a hobby you enjoy, meditation, healthy relationships, and exercise all work to lower your stress level. If your efforts do not seem to be helping, get help from your health care provider or a trained mental health professional.   This information is not intended to replace advice given to you by your health care provider. Make sure you discuss any questions you have with your health care provider.   Document Released: 12/16/2010 Document Revised: 04/20/2014 Document Reviewed: 05/24/2013 Elsevier Interactive Patient Education 2016 [employer] Vegetarian Eating and Nutrition Vegetarian diets are designed for those people who prefer vegetarian eating for religious, ecologic, or personal reasons. These diets, which are often lower in cholesterol and saturated fats, can provide significant health benefits. They result in lower rates of obesity, diabetes, breast and colon cancers, and cardiovascular  and gallbladder diseases.  There are several types of vegetarian diets, but all restrict animal proteins to some degree. Because animal proteins have important nutrients, vegetarians should make sure to get these nutrients from other types of foods. The main purpose of healthy vegetarian eating is to provide the energy, vitamins, and minerals for proper growth and health maintenance at any age.  WHAT DO I NEED TO KNOW ABOUT VEGETARIAN EATING AND NUTRITION? The following nutrients are found in animal products. It is important to make sure you get enough of these nutrients from your diet. If you think you are not getting the right nutrients or if you do not eat any animal products, talk with your health care provider or dietitian about taking supplements. Protein Your dietitian can help you determine your individual protein needs. Sources of protein include:  Beans, such as black beans or kidney beans, or other legumes, such as lentils and split peas. Soy products. Nuts, such as almonds, [place] nuts, and pecans. Seeds, such as sunflower seeds. Tofu, tempeh, and hummus. Eggs, milk, and cheese. Vitamin B12 This vitamin is only found in:  Cheeses, fish, and eggs. It can also be found in some breakfast cereals and other prepared products that have added vitamin B12. If you eat no animal products, you should discuss taking a supplement with your health care provider or dietitian. Vitamin D Good sources of vitamin D include:  Cod liver oil. Fish, such as swordfish, salmon, and tuna. Dairy products. Orange juice. Fortified mushrooms. Cereals with added vitamin D. Iron Good sources of iron include:  Dark, leafy greens. Nuts. Beans. Grain products that have added iron, such as cereals. Tofu, tempeh, soybeans, and quinoa. Plant-based iron is better absorbed when eaten with vitamin C. For example, squeeze lemon juice over cooked greens like kale, chard, or spinach, or have a glass of orange juice with your  meals. Omega-3 Fatty Acids Good sources of omega-3 fatty acids include:  Walnuts. Foods with added omega-3 fatty acids, such as eggs, milk, and juices. Flax seeds, canola oil, soybean oil, and tofu. Fish (cold water fatty fish such as salmon, sardines, mackerel, herring, lake trout, and albacore tuna). Calcium Good sources of calcium include:  Dark, leafy greens, such as kale, bok choy, Chinese cabbage, collard greens and mustard greens. Broccoli. Okra. Dairy products. Soy products with added calcium. Calcium-fortified breakfast cereals, calcium-fortified fruit juices, and figs. Zinc Good sources of zinc include:  Legumes. Dairy products. Wheat germ, cereals, and breads that have added zinc. Baked beans. Legumes, such as cashews, chickpeas, kidney beans, and green peas. Almonds and nut butters. Tofu and other soy  products.   This information is not intended to replace advice given to you by your health care provider. Make sure you discuss any questions you have with your health care provider.   Document Released: 04/02/2003 Document Revised: 04/20/2014 Document Reviewed: 02/02/2013 Elsevier Interactive Patient Education [employer]

## 2016-02-26 ENCOUNTER — Telehealth: Payer: Self-pay | Admitting: Endocrinology

## 2016-02-26 NOTE — Telephone Encounter (Signed)
Pt's wife called in and said that his sugar was 305 after he had eaten his breakfast, she asked about possibly putting him on a sliding scale.  Please advise.

## 2016-02-26 NOTE — Telephone Encounter (Signed)
See message and please advise, Thanks!  

## 2016-02-26 NOTE — Telephone Encounter (Signed)
Please verify lantus is 30 units qam Then increase to 50 qam Please verify no recent steroid pills or shot.

## 2016-02-26 NOTE — Telephone Encounter (Signed)
I contacted the patient's wife she advised me of the recent blood sugar's the patient had.  02/25/2016 after lunch 295  02/25/2016 2:21 pm 307 02/25/2016 2:29 pm 295

## 2016-02-26 NOTE — Telephone Encounter (Signed)
please call patient: I need more information about how it is running in general

## 2016-02-27 NOTE — Telephone Encounter (Signed)
I contacted the patient and advised of message. Patient stated he has not had any recent steroid pills or injections. Patient did verify he was taking 30 units daily and will increase to 50 units daily. Patient had no further questions at this time.

## 2016-02-28 ENCOUNTER — Telehealth: Payer: Self-pay | Admitting: Endocrinology

## 2016-02-28 NOTE — Telephone Encounter (Signed)
Ok, please increase to 60 units qam

## 2016-02-28 NOTE — Telephone Encounter (Signed)
See message and please advise, Thanks!  

## 2016-02-28 NOTE — Telephone Encounter (Signed)
Pt called and said that his blood sugar this morning was 206, and that was after 50 units of insulin, as well fasting. Please advise.

## 2016-02-28 NOTE — Telephone Encounter (Signed)
I contacted the patient and advised of message. Patient voiced understanding.  

## 2016-03-08 NOTE — Progress Notes (Signed)
Subjective:    Patient ID: William Aguirre, male    DOB: July 20, 1934, 81 y.o.   MRN: [id number]  HPI Pt returns for f/u of diabetes mellitus: DM type: Insulin-requiring type 2.   Dx'ed: 1986 Complications: CAD and polyneuropathy Therapy: insulin since soon after dx.  DKA: never Severe hypoglycemia: never Pancreatitis: never Other: he takes QD insulin, due to memory loss.   Interval history:  pt has memory loss, but he is here alone today.  pt states he feels well in general.  pt says he never misses insulin injections.  Pt says cbg meter is not working.  He now takes 60 units qam Past Medical History:  Diagnosis Date  . Coronary artery disease    Moderate LAD, Diagonal, Circumflex disease by cath 2004  . Dementia   . Diabetes mellitus without complication (HCC)   . Hyperlipidemia   . Hypertension     Past Surgical History:  Procedure Laterality Date  . None      Social History   Social History  . Marital status: Married    Spouse name: N/A  . Number of children: 4  . Years of education: N/A   Occupational History  . Accountant    Social History Main Topics  . Smoking status: Former Smoker    Years: 3.00    Quit date: 04/13/1965  . Smokeless tobacco: Never Used  . Alcohol use No  . Drug use: No  . Sexual activity: Not on file   Other Topics Concern  . Not on file   Social History Narrative   Lives with wife in a one story home.   Retired IT trainerCPA.          Current Outpatient Prescriptions on File Prior to Visit  Medication Sig Dispense Refill  . ASPIRIN LOW DOSE 81 MG EC tablet TAKE 1 TABLET (81MG ) BY MOUTH ONCE DAILY 90 tablet 3  . atorvastatin (LIPITOR) 80 MG tablet Take 80 mg by mouth daily.    . bisoprolol (ZEBETA) 5 MG tablet Take 1 tablet (5 mg total) by mouth daily. 30 tablet 6  . clopidogrel (PLAVIX) 75 MG tablet TAKE 1 TABLET BY MOUTH ONCE DAILY WITH BREAKFAST 90 tablet 3  . Melatonin 3 MG TABS Take 3 mg by mouth at bedtime.    Marland Kitchen. omeprazole  (PRILOSEC OTC) 20 MG tablet Take 20 mg by mouth daily.    . traMADol (ULTRAM) 50 MG tablet Take 1 tablet (50 mg total) by mouth 2 (two) times daily as needed for severe pain. 180 tablet 0  . valsartan (DIOVAN) 80 MG tablet Take 1 tablet (80 mg total) by mouth daily. 90 tablet 0   No current facility-administered medications on file prior to visit.     Allergies  Allergen Reactions  . Hydrocodone-Acetaminophen Other (See Comments)     Delusions    Family History  Problem Relation Age of Onset  . Other Father     Deceased  . Other Mother     Deceased, 5779  . Other Sister     Deceased  . Cancer Sister     Deceased  . Healthy Son     Living  . Heart disease Son     Deceased, 30 from heart valve dysfunction  . Healthy Daughter     BP 132/70   Pulse 72   Ht 5\' 8"  (1.727 m)   Wt 197 lb (89.4 kg)   SpO2 98%   BMI 29.95 kg/m  Review of Systems He denies hypoglycemia.      Objective:   Physical Exam VITAL SIGNS:  See vs page GENERAL: no distress Pulses: dorsalis pedis intact bilat.   MSK: no deformity of the feet CV: no leg edema Skin:  no ulcer on the feet.  normal color and temp on the feet. Neuro: sensation is intact to touch on the feet, but decreased from normal.      Assessment & Plan:  Noncompliance with cbg recording and insulin, persistent Memory loss: this interferes with rx of DM, especially in view of the fact that he is here alone today.   Insulin-requiring type 2 DM with CAD.   Patient is advised the following: Patient Instructions  Here is a new meter.  I have sent a prescription to your pharmacy, for strips. check your blood sugar twice a day.  vary the time of day when you check, between before the 3 meals, and at bedtime.  also check if you have symptoms of your blood sugar being too high or too low.  please keep a record of the readings and bring it to your next appointment here (or you can bring the meter itself).  You can write it on any piece  of paper.  please call us sooner if your blood sugar goes below 70, or if you have a lot of readings over 200. A diabetes blood test is requested for you today.  We'll let you know about the results.   Please come back for a follow-up appointment in 3 months.

## 2016-03-09 ENCOUNTER — Telehealth: Payer: Self-pay | Admitting: Endocrinology

## 2016-03-09 ENCOUNTER — Ambulatory Visit (INDEPENDENT_AMBULATORY_CARE_PROVIDER_SITE_OTHER): Payer: Commercial Managed Care - HMO | Admitting: Endocrinology

## 2016-03-09 ENCOUNTER — Encounter: Payer: Self-pay | Admitting: Endocrinology

## 2016-03-09 VITALS — BP 132/70 | HR 72 | Ht 68.0 in | Wt 197.0 lb

## 2016-03-09 DIAGNOSIS — E109 Type 1 diabetes mellitus without complications: Secondary | ICD-10-CM | POA: Diagnosis not present

## 2016-03-09 LAB — BASIC METABOLIC PANEL
BUN: 13 mg/dL (ref 6–23)
CHLORIDE: 105 meq/L (ref 96–112)
CO2: 28 mEq/L (ref 19–32)
Calcium: 9.2 mg/dL (ref 8.4–10.5)
Creatinine, Ser: 1.21 mg/dL (ref 0.40–1.50)
GFR: 74.27 mL/min (ref >60.00)
GLUCOSE: 238 mg/dL — AB (ref 70–99)
POTASSIUM: 4.2 meq/L (ref 3.5–5.1)
SODIUM: 140 meq/L (ref 135–145)

## 2016-03-09 MED ORDER — GLUCOSE BLOOD VI STRP: ORAL_STRIP | 5 refills | Status: DC

## 2016-03-09 MED ORDER — ACCU-CHEK AVIVA PLUS W/DEVICE KIT: PACK | 2 refills | Status: DC

## 2016-03-09 MED ORDER — ACCU-CHEK SOFTCLIX LANCETS MISC: 2 refills | Status: DC

## 2016-03-09 MED ORDER — INSULIN GLARGINE 100 UNIT/ML SOLOSTAR PEN: 60.0000 [IU] | PEN_INJECTOR | SUBCUTANEOUS | 11 refills | Status: DC

## 2016-03-09 MED ORDER — GLUCOSE BLOOD VI STRP: 1.0000 | ORAL_STRIP | Freq: Two times a day (BID) | 12 refills | Status: DC

## 2016-03-09 MED ORDER — ACCU-CHEK FASTCLIX LANCETS MISC: 2 refills | Status: DC

## 2016-03-09 NOTE — Telephone Encounter (Signed)
Rx submitted for accu-chek test strips, meter and lancets.

## 2016-03-09 NOTE — Telephone Encounter (Signed)
[facility] calling to let us know the rx for the one touch verio is not covered but it will cover true metrics or accucheck

## 2016-03-09 NOTE — Telephone Encounter (Signed)
Refill resubmitted.  

## 2016-03-09 NOTE — Telephone Encounter (Signed)
Pharmacy stated that the lancet are for the soft click. Not the fast click.could you resubmit.

## 2016-03-09 NOTE — Patient Instructions (Addendum)
Here is a new meter.  I have sent a prescription to your pharmacy, for strips. check your blood sugar twice a day.  vary the time of day when you check, between before the 3 meals, and at bedtime.  also check if you have symptoms of your blood sugar being too high or too low.  please keep a record of the readings and bring it to your next appointment here (or you can bring the meter itself).  You can write it on any piece of paper.  please call us sooner if your blood sugar goes below 70, or if you have a lot of readings over 200. A diabetes blood test is requested for you today.  We'll let you know about the results.   Please come back for a follow-up appointment in 3 months.

## 2016-03-10 NOTE — Telephone Encounter (Signed)
See message and please advise, Thanks!  

## 2016-03-10 NOTE — Telephone Encounter (Signed)
Pt calling to let us know that he has 3 boxes of novolog pens and 2 vials of novolog. Dr. Everardo AllEllison suggested that he take 60 u of lantus, he paid over $100 for the novolog so he needs to know how to use this up

## 2016-03-10 NOTE — Telephone Encounter (Signed)
As now now, you should hold off on taking it, but we may need it later.

## 2016-03-10 NOTE — Telephone Encounter (Signed)
I contacted the patient and advised of message. Patient voiced understanding and had no further questions at this time.  

## 2016-03-11 ENCOUNTER — Other Ambulatory Visit: Payer: Self-pay | Admitting: Endocrinology

## 2016-03-11 LAB — FRUCTOSAMINE: Fructosamine: 504 umol/L — ABNORMAL HIGH (ref 190–270)

## 2016-03-11 MED ORDER — INSULIN GLARGINE 100 UNIT/ML SOLOSTAR PEN: 75.0000 [IU] | PEN_INJECTOR | SUBCUTANEOUS | 11 refills | Status: DC

## 2016-03-17 NOTE — Telephone Encounter (Signed)
Patient will be getting his diabetic supplies from [facility]  [phone]  Patient wife sated he has a glucose blood (ACCU-CHEK AVIVA PLUS) meter

## 2016-03-18 NOTE — Telephone Encounter (Signed)
Pt wife called and requested that the One Touch Verio Test strips and Lancets be sent to [facility] [address].

## 2016-03-19 ENCOUNTER — Other Ambulatory Visit: Payer: Self-pay | Admitting: Emergency Medicine

## 2016-03-19 MED ORDER — GLUCOSE BLOOD VI STRP: ORAL_STRIP | 5 refills | Status: DC

## 2016-03-19 MED ORDER — VALSARTAN 80 MG PO TABS: 80.0000 mg | ORAL_TABLET | Freq: Every day | ORAL | 0 refills | Status: DC

## 2016-03-19 NOTE — Telephone Encounter (Signed)
I contacted the patient and discusses the two message. Patient stated right now he is in need of the onetouch verio test strips submitted to [facility]. Refill submitted per patient's request. I advised the patient we have not received a fax from [facility] yet and her verbalized understanding.

## 2016-03-20 ENCOUNTER — Encounter: Payer: Self-pay | Admitting: Adult Health

## 2016-03-25 ENCOUNTER — Encounter: Payer: Self-pay | Admitting: Adult Health

## 2016-03-25 ENCOUNTER — Ambulatory Visit (INDEPENDENT_AMBULATORY_CARE_PROVIDER_SITE_OTHER): Payer: Commercial Managed Care - HMO | Admitting: Adult Health

## 2016-03-25 VITALS — BP 130/82 | Temp 98.1°F | Ht 67.0 in | Wt 202.0 lb

## 2016-03-25 DIAGNOSIS — R2681 Unsteadiness on feet: Secondary | ICD-10-CM

## 2016-03-25 DIAGNOSIS — G3184 Mild cognitive impairment, so stated: Secondary | ICD-10-CM

## 2016-03-25 DIAGNOSIS — Z0000 Encounter for general adult medical examination without abnormal findings: Secondary | ICD-10-CM | POA: Diagnosis not present

## 2016-03-25 DIAGNOSIS — I10 Essential (primary) hypertension: Secondary | ICD-10-CM

## 2016-03-25 DIAGNOSIS — E7800 Pure hypercholesterolemia, unspecified: Secondary | ICD-10-CM

## 2016-03-25 NOTE — Progress Notes (Signed)
Pre visit review using our clinic review tool, if applicable. No additional management support is needed unless otherwise documented below in the visit note. 

## 2016-03-25 NOTE — Patient Instructions (Signed)
It was great seeing you today!  [facility] will call you to schedule your appointment   Please follow up for your labs   Follow up with me in one year for your physical

## 2016-03-25 NOTE — Progress Notes (Signed)
Subjective:    Patient ID: William Aguirre, male    DOB: Aug 23, 1934, 81 y.o.   MRN: [id number]  HPI  Patient presents for yearly preventative medicine examination. He is a pleasant 81 year old male who  has a past medical history of Coronary artery disease; Dementia; Diabetes mellitus without complication ([place]); Hyperlipidemia; and Hypertension. is wife presents to the office today to help provide history   All immunizations and health maintenance protocols were reviewed with the patient and needed orders were placed.  Appropriate screening laboratory values were ordered for the patient including screening of hyperlipidemia, renal function and hepatic function. If indicated by BPH, a PSA was ordered.  Medication reconciliation,  past medical history, social history, problem list and allergies were reviewed in detail with the patient  Goals were established with regard to weight loss, exercise, and  diet in compliance with medications  End of life planning was discussed.  He sees Dr. Loanne Drilling for uncontrolled DM. Was last seen in November 2017  His wife reports that yesterday William Aguirre had a fall where he lost his balance. He denies hitting his head or any injury. He did not have a syncopal episode. His wife reports that appears to be a common theme at home, where William Aguirre cannot maintain his balance or has trouble getting out of a chair. He has been seen by Dr. Posey Pronto with Neurology in the past for this issue as well as his cognitive impairment. His wife would like him to see someone else, and would like to be referred to [facility] for further evaluation.   Review of Systems  Constitutional: Negative.   HENT: Negative.   Eyes: Negative.   Respiratory: Negative.   Cardiovascular: Negative.   Gastrointestinal: Negative.   Endocrine: Negative.   Genitourinary: Negative.   Musculoskeletal: Negative.   Skin: Negative.   Allergic/Immunologic: Negative.   Neurological: Negative.     Hematological: Negative.   Psychiatric/Behavioral: Positive for decreased concentration.  All other systems reviewed and are negative.  Past Medical History:  Diagnosis Date  . Coronary artery disease    Moderate LAD, Diagonal, Circumflex disease by cath 2004  . Dementia   . Diabetes mellitus without complication ([place])   . Hyperlipidemia   . Hypertension     Social History   Social History  . Marital status: Married    Spouse name: N/A  . Number of children: 4  . Years of education: N/A   Occupational History  . Accountant    Social History Main Topics  . Smoking status: Former Smoker    Years: 3.00    Quit date: 04/13/1965  . Smokeless tobacco: Never Used  . Alcohol use No  . Drug use: No  . Sexual activity: Not on file   Other Topics Concern  . Not on file   Social History Narrative   Lives with wife in a one story home.   Retired Engineer, maintenance (IT).          Past Surgical History:  Procedure Laterality Date  . None      Family History  Problem Relation Age of Onset  . Other Father     Deceased  . Other Mother     Deceased, 21  . Other Sister     Deceased  . Cancer Sister     Deceased  . Healthy Son     Living  . Heart disease Son     Deceased, 6 from heart valve dysfunction  . Healthy Daughter  Allergies  Allergen Reactions  . Hydrocodone-Acetaminophen Other (See Comments)     Delusions    Current Outpatient Prescriptions on File Prior to Visit  Medication Sig Dispense Refill  . ACCU-CHEK FASTCLIX LANCETS MISC Use to check blood sugar 2 times per day. 100 each 2  . ACCU-CHEK SOFTCLIX LANCETS lancets USE TO TEST BLOOD SUGAR TWICE DAILY 100 each 2  . ASPIRIN LOW DOSE 81 MG EC tablet TAKE 1 TABLET (81MG) BY MOUTH ONCE DAILY 90 tablet 3  . atorvastatin (LIPITOR) 80 MG tablet Take 80 mg by mouth daily.    . bisoprolol (ZEBETA) 5 MG tablet Take 1 tablet (5 mg total) by mouth daily. 30 tablet 6  . Blood Glucose Monitoring Suppl (ACCU-CHEK AVIVA PLUS)  w/Device KIT Use to check blood sugar two times per day. 1 kit 2  . clopidogrel (PLAVIX) 75 MG tablet TAKE 1 TABLET BY MOUTH ONCE DAILY WITH BREAKFAST 90 tablet 3  . glucose blood (ACCU-CHEK AVIVA PLUS) test strip Use to check blood sugar 2 times per day. 100 each 5  . glucose blood (ONETOUCH VERIO) test strip Use to check blood sugar 2 times per day. 100 each 5  . Insulin Glargine (LANTUS SOLOSTAR) 100 UNIT/ML Solostar Pen Inject 75 Units into the skin every morning. And pen needles 1/day 30 mL 11  . Melatonin 3 MG TABS Take 3 mg by mouth at bedtime.    . traMADol (ULTRAM) 50 MG tablet Take 1 tablet (50 mg total) by mouth 2 (two) times daily as needed for severe pain. 180 tablet 0  . valsartan (DIOVAN) 80 MG tablet Take 1 tablet (80 mg total) by mouth daily. 90 tablet 0  . omeprazole (PRILOSEC OTC) 20 MG tablet Take 20 mg by mouth daily.     No current facility-administered medications on file prior to visit.     BP 130/82 (BP Location: Right Arm, Patient Position: Sitting, Cuff Size: Large)   Temp 98.1 F (36.7 C) (Oral)   Ht 5' 7"  (1.702 m)   Wt 202 lb (91.6 kg)   BMI 31.64 kg/m       Objective:   Physical Exam  Constitutional: He is oriented to person, place, and time. He appears well-developed and well-nourished. No distress.  obese  HENT:  Head: Normocephalic and atraumatic.  Right Ear: External ear normal.  Left Ear: External ear normal.  Nose: Nose normal.  Mouth/Throat: Oropharynx is clear and moist. No oropharyngeal exudate.  Eyes: Conjunctivae and EOM are normal. Pupils are equal, round, and reactive to light. Right eye exhibits no discharge. Left eye exhibits no discharge. No scleral icterus.  Neck: Normal range of motion. Neck supple. No JVD present. No tracheal deviation present. No thyromegaly present.  Cardiovascular: Normal rate, regular rhythm, normal heart sounds and intact distal pulses.  Exam reveals no gallop and no friction rub.   No murmur  heard. Pulmonary/Chest: Effort normal and breath sounds normal. No stridor. No respiratory distress. He has no wheezes. He has no rales. He exhibits no tenderness.  Abdominal: Soft. Bowel sounds are normal. He exhibits no distension and no mass. There is no tenderness. There is no rebound and no guarding.  Musculoskeletal: Normal range of motion. He exhibits no edema, tenderness or deformity.  Lymphadenopathy:    He has no cervical adenopathy.  Neurological: He is alert and oriented to person, place, and time. He has normal reflexes. He displays normal reflexes. No cranial nerve deficit. He exhibits normal muscle tone. Coordination normal.  Skin: Skin is warm and dry. No rash noted. He is not diaphoretic. No erythema. No pallor.  Psychiatric: He has a normal mood and affect. His behavior is normal. Judgment and thought content normal.  Nursing note and vitals reviewed.     Assessment & Plan:  1. Routine general medical examination at a health care facility - Patient had eaten prior to appointment. He will need to come back for fasting labs.  - Will refer to Neurology for cognitive impairment and unsteady gait.  - Advised to work on a diabetic diet and stay active to help with weight loss.  - Follow up in one year for next CPE or sooner if needed - Basic metabolic panel; Future - CBC with Differential/Platelet; Future - Hepatic function panel; Future - Lipid panel; Future - POCT Urinalysis Dipstick (Automated); Future - PSA; Future - TSH; Future  2. Essential hypertension - Near goal. No change in medication at this time - Basic metabolic panel; Future - CBC with Differential/Platelet; Future - Hepatic function panel; Future - Lipid panel; Future - POCT Urinalysis Dipstick (Automated); Future - PSA; Future - TSH; Future  3. Mild cognitive impairment with memory loss  - Ambulatory referral to Neurology - Basic metabolic panel; Future - CBC with Differential/Platelet; Future -  Hepatic function panel; Future - Lipid panel; Future - POCT Urinalysis Dipstick (Automated); Future - PSA; Future - TSH; Future  4. Unsteady gait  - Ambulatory referral to Neurology  5. Pure hypercholesterolemia  - Basic metabolic panel; Future - CBC with Differential/Platelet; Future - Hepatic function panel; Future - Lipid panel; Future - POCT Urinalysis Dipstick (Automated); Future - PSA; Future - TSH; Future  Dorothyann Peng, NP

## 2016-04-24 ENCOUNTER — Other Ambulatory Visit: Payer: Self-pay | Admitting: Cardiovascular Disease

## 2016-05-04 ENCOUNTER — Ambulatory Visit (INDEPENDENT_AMBULATORY_CARE_PROVIDER_SITE_OTHER): Payer: Medicare HMO | Admitting: Neurology

## 2016-05-04 ENCOUNTER — Encounter: Payer: Self-pay | Admitting: Neurology

## 2016-05-04 VITALS — BP 123/74 | HR 70 | Ht 67.0 in | Wt 204.5 lb

## 2016-05-04 DIAGNOSIS — G3184 Mild cognitive impairment, so stated: Secondary | ICD-10-CM | POA: Diagnosis not present

## 2016-05-04 DIAGNOSIS — R2681 Unsteadiness on feet: Secondary | ICD-10-CM

## 2016-05-04 DIAGNOSIS — G609 Hereditary and idiopathic neuropathy, unspecified: Secondary | ICD-10-CM

## 2016-05-04 DIAGNOSIS — E538 Deficiency of other specified B group vitamins: Secondary | ICD-10-CM | POA: Diagnosis not present

## 2016-05-04 NOTE — Progress Notes (Signed)
Reason for visit: Gait disorder  Referring physician: Dr. Carolynn Comment William Aguirre is a 82 y.o. male  History of present illness:  William Aguirre is a 82 year old right-handed black male with a history of diabetes who has been seen previously by neurology for a gait disorder and for a mild memory disorder with Dr. Posey Pronto. The patient has had ongoing issues with the memory and balance, the family indicates that they do not think his memory has worsened much over time, but his gait has gradually become less stable. The patient last fell about 4 months ago, he would use a cane for ambulation. He has a history of a mild left foot drop, he does have numbness in the feet associated with diabetes. He reports that he is not very active during the day, he may have shortness of breath with more prolonged walking. The patient does have some problems with low back pain that is not severe, he has no pain radiating down either leg. He denies any neck pain or pain down the arms. The patient has undergone MRI evaluation of the brain on 06/12/2015 that revealed evidence of generalized atrophy and a moderate level of small vessel ischemic changes that was chronic in nature. The patient has numbness in both hands, and some weakness in the hands as well. The patient does have some problems controlling the bowels or the bladder, this is not a recent onset issue. In regards to the memory, he has mild changes. He has some issues remembering names for people, and with recent events. He is able to operate a motor vehicle without difficulty. The patient is still able to do the finances and pay the bills. He requires some assistance keeping up with appointments and with medications. He is sent to this office for an evaluation.  Past Medical History:  Diagnosis Date  . Coronary artery disease    Moderate LAD, Diagonal, Circumflex disease by cath 2004  . Dementia   . Diabetes mellitus without complication ([facility])   .  Hyperlipidemia   . Hypertension     Past Surgical History:  Procedure Laterality Date  . CATARACT EXTRACTION, BILATERAL      Family History  Problem Relation Age of Onset  . Other Father     Deceased  . Other Mother     Deceased, 64  . Other Sister     Deceased  . Cancer Sister     Deceased  . Healthy Son     Living  . Heart disease Son     Deceased, 25 from heart valve dysfunction  . Healthy Daughter     Social history:  reports that he quit smoking about 51 years ago. He quit after 3.00 years of use. He has never used smokeless tobacco. He reports that he does not drink alcohol or use drugs.  Medications:  Prior to Admission medications   Medication Sig Start Date End Date Taking? Authorizing Provider  ACCU-CHEK FASTCLIX LANCETS MISC Use to check blood sugar 2 times per day. 03/09/16  Yes Renato Shin, MD  ACCU-CHEK SOFTCLIX LANCETS lancets USE TO TEST BLOOD SUGAR TWICE DAILY 03/09/16  Yes Renato Shin, MD  atorvastatin (LIPITOR) 80 MG tablet Take 80 mg by mouth daily.   Yes Historical Provider, MD  bisoprolol (ZEBETA) 5 MG tablet Take 1 tablet (5 mg total) by mouth daily. 01/17/16  Yes Tanda Rockers, MD  Blood Glucose Monitoring Suppl (ACCU-CHEK AVIVA PLUS) w/Device KIT Use to check blood sugar two  times per day. 03/09/16  Yes Renato Shin, MD  clopidogrel (PLAVIX) 75 MG tablet TAKE 1 TABLET BY MOUTH ONCE DAILY WITH BREAKFAST 01/02/16  Yes Burnell Blanks, MD  glucose blood (ACCU-CHEK AVIVA PLUS) test strip Use to check blood sugar 2 times per day. 03/09/16  Yes Renato Shin, MD  glucose blood (ONETOUCH VERIO) test strip Use to check blood sugar 2 times per day. 03/19/16  Yes Renato Shin, MD  Insulin Glargine (LANTUS SOLOSTAR) 100 UNIT/ML Solostar Pen Inject 75 Units into the skin every morning. And pen needles 1/day 03/11/16  Yes Renato Shin, MD  Melatonin 3 MG TABS Take 3 mg by mouth at bedtime. 02/11/16  Yes Historical Provider, MD  omeprazole (PRILOSEC OTC) 20  MG tablet Take 20 mg by mouth daily.   Yes Historical Provider, MD  SM ASPIRIN ADULT LOW STRENGTH 81 MG EC tablet TAKE 1 TABLET (81MG) BY MOUTH ONCE DAILY 04/24/16  Yes Burnell Blanks, MD  traMADol (ULTRAM) 50 MG tablet Take 1 tablet (50 mg total) by mouth 2 (two) times daily as needed for severe pain. 09/17/14  Yes Kennyth Arnold, FNP  valsartan (DIOVAN) 80 MG tablet Take 1 tablet (80 mg total) by mouth daily. 03/19/16  Yes Dorothyann Peng, NP      Allergies  Allergen Reactions  . Hydrocodone-Acetaminophen Other (See Comments)     Delusions    ROS:  Out of a complete 14 system review of symptoms, the patient complains only of the following symptoms, and all other reviewed systems are negative.  Urination problems Memory loss, numbness, weakness Decreased energy Sleepiness, snoring  Blood pressure 123/74, pulse 70, height _0  (1.702 m), weight 204 lb 8 oz (92.8 kg).  Physical Exam  General: The patient is alert and cooperative at the time of the examination. The patient is moderately to markedly obese.  Eyes: Pupils are equal, round, and reactive to light. Discs are flat bilaterally.  Neck: The neck is supple, no carotid bruits are noted.  Respiratory: The respiratory examination is clear.  Cardiovascular: The cardiovascular examination reveals a regular rate and rhythm, no obvious murmurs or rubs are noted.  Skin: Extremities are without significant edema.  Neurologic Exam  Mental status: The patient is alert and oriented x 3 at the time of the examination. The Mini-Mental Status Examination done today shows a total score of 24/30.  Cranial nerves: Facial symmetry is present. There is good sensation of the face to pinprick and soft touch bilaterally. The strength of the facial muscles and the muscles to head turning and shoulder shrug are normal bilaterally. Speech is well enunciated, no aphasia or dysarthria is noted. Extraocular movements are full. Visual fields are  full. The tongue is midline, and the patient has symmetric elevation of the soft palate. No obvious hearing deficits are noted.  Motor: The motor testing reveals 5 over 5 strength of all 4 extremities, with exception of weakness of the intrinsic muscles of the hands bilaterally, right greater than left. The patient has evidence of a mild left foot drop. Good symmetric motor tone is noted throughout.  Sensory: Sensory testing is intact to pinprick, soft touch, vibration sensation, and position sense on the upper extremities. With the lower extremities, there is a stocking pattern pinprick sensory deficit to the knees bilaterally with reduction of vibration sensation in both feet and ankles up to the knees. Position sense is decreased in both feet. No evidence of extinction is noted.  Coordination: Cerebellar testing reveals good  finger-nose-finger and heel-to-shin bilaterally.  Gait and station: Gait is slightly wide-based, the patient can walk independently, he uses a cane for ambulation. Tandem gait is unsteady. Romberg is negative. No drift is seen. The patient is not able to walk on heels on either side.  Reflexes: Deep tendon reflexes are symmetric and normal bilaterally, with exception of reduction in ankle jerk reflexes bilaterally. Toes are downgoing bilaterally.   Assessment/Plan:  1. Gait disturbance  2. Memory disturbance  3. Diabetes with peripheral neuropathy  The patient has evidence of small vessel ischemic changes of the brain and evidence of a diabetic peripheral neuropathy associated with a mild left foot drop. These issues are likely contributing to the balance, and the small vessel disease may be contributed to the memory issues as well. The memory will need to be followed over time. The patient will be set up for blood work today. He will be sent for physical therapy for gait training, he will need an exercise program to help improve stamina when it comes to walking. He will  follow-up in about 5 months.  Jill Alexanders MD 05/04/2016 2:56 PM  [facility] [address] [place], [place] [address]  Phone [phone] Fax [phone]

## 2016-05-04 NOTE — Patient Instructions (Signed)
   We will get blood work today and get Physical therapy for the walking. 

## 2016-05-07 LAB — MULTIPLE MYELOMA PANEL, SERUM
ALPHA2 GLOB SERPL ELPH-MCNC: 0.7 g/dL (ref 0.4–1.0)
Albumin SerPl Elph-Mcnc: 3.5 g/dL (ref 2.9–4.4)
Albumin/Glob SerPl: 1.1 (ref 0.7–1.7)
Alpha 1: 0.2 g/dL (ref 0.0–0.4)
B-GLOBULIN SERPL ELPH-MCNC: 1 g/dL (ref 0.7–1.3)
Gamma Glob SerPl Elph-Mcnc: 1.7 g/dL (ref 0.4–1.8)
Globulin, Total: 3.5 g/dL (ref 2.2–3.9)
IGG (IMMUNOGLOBIN G), SERUM: 1691 mg/dL — AB (ref 700–1600)
IGM (IMMUNOGLOBULIN M), SRM: 67 mg/dL (ref 15–143)
IgA/Immunoglobulin A, Serum: 184 mg/dL (ref 61–437)
Total Protein: 7 g/dL (ref 6.0–8.5)

## 2016-05-07 LAB — VITAMIN B12: Vitamin B-12: 631 pg/mL (ref 232–1245)

## 2016-05-07 LAB — ANA W/REFLEX: Anti Nuclear Antibody(ANA): NEGATIVE

## 2016-05-07 LAB — B. BURGDORFI ANTIBODIES

## 2016-05-07 LAB — RPR: RPR: NONREACTIVE

## 2016-05-07 LAB — ANGIOTENSIN CONVERTING ENZYME: Angio Convert Enzyme: 42 U/L (ref 14–82)

## 2016-05-11 ENCOUNTER — Ambulatory Visit: Payer: Medicare HMO | Admitting: Physical Therapy

## 2016-05-13 ENCOUNTER — Ambulatory Visit: Payer: Medicare HMO | Attending: Neurology | Admitting: Physical Therapy

## 2016-05-13 ENCOUNTER — Encounter: Payer: Self-pay | Admitting: Physical Therapy

## 2016-05-13 DIAGNOSIS — R2689 Other abnormalities of gait and mobility: Secondary | ICD-10-CM | POA: Diagnosis not present

## 2016-05-13 DIAGNOSIS — R2681 Unsteadiness on feet: Secondary | ICD-10-CM | POA: Insufficient documentation

## 2016-05-13 DIAGNOSIS — Z9181 History of falling: Secondary | ICD-10-CM | POA: Diagnosis not present

## 2016-05-13 DIAGNOSIS — M6281 Muscle weakness (generalized): Secondary | ICD-10-CM | POA: Diagnosis not present

## 2016-05-13 NOTE — Therapy (Signed)
[facility] [address] [place], [place], [address] Phone: [phone]   Fax:  [phone]  Physical Therapy Evaluation  Patient Details  Name: William Aguirre MRN: [id number] Date of Birth: 1935-03-21 Referring Provider: Lesly Dukes, MD  Encounter Date: 05/13/2016      PT End of Session - 05/13/16 1855    Visit Number 1   Number of Visits 17   Date for PT Re-Evaluation 07/10/16   Authorization Type Medicare G-Code & progress note every 10 visits.   PT Start Time 1310   PT Stop Time 1355   PT Time Calculation (min) 45 min   Equipment Utilized During Treatment Gait belt   Activity Tolerance Patient limited by fatigue   Behavior During Therapy WFL for tasks assessed/performed      Past Medical History:  Diagnosis Date  . Coronary artery disease    Moderate LAD, Diagonal, Circumflex disease by cath 2004  . Dementia   . Diabetes mellitus without complication ([facility])   . Hyperlipidemia   . Hypertension     Past Surgical History:  Procedure Laterality Date  . CATARACT EXTRACTION, BILATERAL      There were no vitals filed for this visit.       Subjective Assessment - 05/13/16 1316    Subjective This 81yo male has experienced increased instability. He has history of memory deficits and gait instability. Wife and patient report initial fall Nov. 2014 and they have noticed decline since that time. Patient saw neurologist on 05/04/2016 and was referred for PT evaluation with noted increased instability. Patient presents for PT evaluation with his wife.    Patient is accompained by: Family member   Limitations Lifting;Standing;Walking;House hold activities   Patient Stated Goals To improve balance quickly   Currently in Pain? No/denies            [facility] PT Assessment - 05/13/16 1315      Assessment   Medical Diagnosis Balance & gait abnormality   Referring Provider Lesly Dukes, MD   Onset  Date/Surgical Date 05/10/16  office visit & referral   Hand Dominance Right     Precautions   Precautions Fall     Balance Screen   Has the patient fallen in the past 6 months Yes   How many times? 1  in bathroom    Has the patient had a decrease in activity level because of a fear of falling?  No   Is the patient reluctant to leave their home because of a fear of falling?  No     Home Environment   Living Environment Private residence   Living Arrangements Spouse/significant other   Type of Home House   Home Access Stairs to enter   Entrance Stairs-Number of Steps 3 + 1   Entrance Stairs-Rails Right;Left;Cannot reach both   Home Layout One level  single step into office & laundry room   Home Equipment [place] - single point;Walker - 2 wheels;Shower seat;Grab bars - tub/shower     Prior Function   Level of Independence Independent with household mobility with device;Independent with community mobility with device;Independent with gait  cane for community & most of household   Vocation Retired   Leisure golf     Observation/Other Assessments   Focus on Therapeutic Outcomes (FOTO)  44.23 Functional Status   Activities of Balance Confidence Scale (ABC Scale)  32.5%   Fear Avoidance Belief Questionnaire (FABQ)  19 (5)  Posture/Postural Control   Posture/Postural Control Postural limitations   Postural Limitations Rounded Shoulders;Forward head;Flexed trunk     ROM / Strength   AROM / PROM / Strength PROM;Strength     PROM   Overall PROM  Within functional limits for tasks performed     Strength   Overall Strength Deficits   Overall Strength Comments RUE grossly 5/5 and LUE grossly 4/5   Strength Assessment Site Hip;Knee;Ankle   Right/Left Hip Right;Left   Right Hip Flexion 5/5   Right Hip Extension 4/5   Right Hip ABduction 4/5   Left Hip Flexion 5/5   Left Hip Extension 4/5   Left Hip ABduction 4/5   Right/Left Knee Right;Left   Right Knee Flexion 4/5   Right  Knee Extension 5/5   Left Knee Flexion 4/5   Left Knee Extension 5/5   Right/Left Ankle Right;Left   Right Ankle Dorsiflexion 4/5   Right Ankle Plantar Flexion 3/5   Left Ankle Dorsiflexion 3+/5   Left Ankle Plantar Flexion 3/5     Transfers   Transfers Sit to Stand;Stand to Sit   Sit to Stand 5: Supervision;With upper extremity assist;With armrests;From chair/3-in-1  uses back of legs against chair to stabilize   Stand to Sit 5: Supervision;With upper extremity assist;With armrests;To chair/3-in-1;Uncontrolled descent     Ambulation/Gait   Ambulation/Gait Yes   Ambulation/Gait Assistance 4: Min assist;5: Supervision  SBA straight path with no distractions   Ambulation Distance (Feet) 300 Feet  decline in stability >150'   Assistive device Straight cane   Gait Pattern Step-through pattern;Decreased arm swing - left;Decreased step length - right;Decreased stance time - left;Decreased stride length;Decreased hip/knee flexion - left;Decreased dorsiflexion - left;Lateral hip instability;Trunk flexed;Wide base of support;Poor foot clearance - left  left foot slap with loading response   Ambulation Surface Indoor;Level   Gait velocity 2.25 ft/se   Stairs Yes   Stairs Assistance 5: Supervision   Stair Management Technique One rail Left;With cane;Step to pattern;Forwards   Number of Stairs 4   Ramp 4: Min assist  cane   Curb 4: Min assist  cane     Standardized Balance Assessment   Standardized Balance Assessment Berg Balance Test;Timed Up and Go Test     Berg Balance Test   Sit to Stand Able to stand  independently using hands   Standing Unsupported Able to stand safely 2 minutes   Sitting with Back Unsupported but Feet Supported on Floor or Stool Able to sit safely and securely 2 minutes   Stand to Sit Controls descent by using hands   Transfers Able to transfer safely, definite need of hands   Standing Unsupported with Eyes Closed Able to stand 10 seconds with supervision    Standing Ubsupported with Feet Together Needs help to attain position and unable to hold for 15 seconds   From Standing, Reach Forward with Outstretched Arm Reaches forward but needs supervision   From Standing Position, Pick up Object from Floor Able to pick up shoe, needs supervision   From Standing Position, Turn to Look Behind Over each Shoulder Turn sideways only but maintains balance   Turn 360 Degrees Needs close supervision or verbal cueing   Standing Unsupported, Alternately Place Feet on Step/Stool Able to complete >2 steps/needs minimal assist   Standing Unsupported, One Foot in Front Needs help to step but can hold 15 seconds   Standing on One Leg Tries to lift leg/unable to hold 3 seconds but remains standing independently  Total Score 30     Timed Up and Go Test   Normal TUG (seconds) 13.12  cane   Cognitive TUG (seconds) 17.95  increase 36.8% and >15.5sec indicates fall risk   TUG Comments unable to perform math task for cognitive TUG so simple naming tasks which was difficult while ambulating.      Functional Gait  Assessment   Gait assessed  Yes   Gait Level Surface Walks 20 ft, slow speed, abnormal gait pattern, evidence for imbalance or deviates 10-15 in outside of the 12 in walkway width. Requires more than 7 sec to ambulate 20 ft.  cane use   Change in Gait Speed Makes only minor adjustments to walking speed, or accomplishes a change in speed with significant gait deviations, deviates 10-15 in outside the 12 in walkway width, or changes speed but loses balance but is able to recover and continue walking.   Gait with Horizontal Head Turns Performs head turns with moderate changes in gait velocity, slows down, deviates 10-15 in outside 12 in walkway width but recovers, can continue to walk.   Gait with Vertical Head Turns Performs task with moderate change in gait velocity, slows down, deviates 10-15 in outside 12 in walkway width but recovers, can continue to walk.   Gait  and Pivot Turn Turns slowly, requires verbal cueing, or requires several small steps to catch balance following turn and stop   Step Over Obstacle Is able to step over one shoe box (4.5 in total height) but must slow down and adjust steps to clear box safely. May require verbal cueing.   Gait with Narrow Base of Support Ambulates less than 4 steps heel to toe or cannot perform without assistance.   Gait with Eyes Closed Cannot walk 20 ft without assistance, severe gait deviations or imbalance, deviates greater than 15 in outside 12 in walkway width or will not attempt task.   Ambulating Backwards Cannot walk 20 ft without assistance, severe gait deviations or imbalance, deviates greater than 15 in outside 12 in walkway width or will not attempt task.   Steps Two feet to a stair, must use rail.   Total Score 7   FGA comment: FGA with cane                             PT Short Term Goals - 05/13/16 1907      PT SHORT TERM GOAL #1   Title Patient demonstrates understanding of initial HEP with wife's cues. (Target Date: 06/12/2016)   Time 4   Period Weeks   Status New     PT SHORT TERM GOAL #2   Title Patient ambulates with cane scanning environment maintaining path with no balance losses. (Target Date: 06/12/2016)   Time 4   Period Weeks   Status New     PT SHORT TERM GOAL #3   Title Patient reaches 5" anteriorly and to floor safely. (Target Date: 06/12/2016)   Time 4   Period Weeks   Status New     PT SHORT TERM GOAL #4   Title Patient ambulates 200' conversing with simple cognitive task without balance loss with supervision. (Target Date: 06/12/2016)   Time 4   Period Weeks   Status New           PT Long Term Goals - 05/13/16 1912      PT LONG TERM GOAL #1   Title Patient demonstrates understanding of  ongoing fitness plan including Silver Sneakers at local fitness center & HEP with wife's cueing. (Target Date: 07/10/2016)   Time 8   Period Weeks   Status New      PT LONG TERM GOAL #2   Title Activities of Balance Confidence survery improves >10% from 32.5% to indicate improved confidence. (Target Date: 07/10/2016)   Time 8   Period Weeks   Status New     PT LONG TERM GOAL #3   Title Cognitive Timed Up & Go with cane with simple naming task <15.5 sec. (Target Date: 07/10/2016)   Time 8   Period Weeks   Status New     PT LONG TERM GOAL #4   Title Berg Balance Test >40/56 to indicate lower fall risk. (Target Date: 07/10/2016)   Time 8   Period Weeks   Status New     PT LONG TERM GOAL #5   Title Functional Gait Assessment with cane >/= 12/30 to indicate lower fall risk. (Target Date: 07/10/2016)   Time 8   Period Weeks   Status New               Plan - 2016-05-28 1857    Clinical Impression Statement This 82 yo male has declined in balance & stability with fall in last 6 months. He has decreased strength in BLE negatively impacting stability. He has generalized impairments in flexiblity limiting balance reactions. Patient has high fall risk as noted by [facility] test of 30/56. His cognitive Timed Up & Go with simple naming task increased 36.8% & >15.5sec indicates fall risk when distracted. Functional Gait Assessment of 6/30 indicates high fall risk with gait. He ambulates with cane with deviations indicating fall risk. He has foot slap that PT will initially attempt to improve with therapuetic exercise but may need AFO. Pateint's condition is evolving and plan of care is moderate. Patient would benefit from PT for skilled instruction.        Rehab Potential Good   PT Frequency 2x / week   PT Duration 8 weeks   PT Treatment/Interventions ADLs/Self Care Home Management;DME Instruction;Gait training;Stair training;Functional mobility training;Therapeutic activities;Therapeutic exercise;Balance training;Neuromuscular re-education;Patient/family education;Orthotic Fit/Training;Manual techniques;Passive range of motion   PT Next Visit Plan  [place]AG[place] HEP add Hamstring & heelcord stretches.   Consulted and Agree with Plan of Care Patient;Family member/caregiver   Family Member Consulted wife      Patient will benefit from skilled therapeutic intervention in order to improve the following deficits and impairments:  Abnormal gait, Decreased activity tolerance, Decreased balance, Decreased cognition, Decreased endurance, Decreased knowledge of precautions, Decreased knowledge of use of DME, Decreased mobility, Impaired flexibility, Decreased strength, Postural dysfunction  Visit Diagnosis: Other abnormalities of gait and mobility - Plan: PT plan of care cert/re-cert  Unsteadiness on feet - Plan: PT plan of care cert/re-cert  Muscle weakness (generalized) - Plan: PT plan of care cert/re-cert  History of falling - Plan: PT plan of care cert/re-cert      G-Codes - 05-28-2016 1918    Functional Assessment Tool Used Berg Balance Test 30/56   Functional Limitation Mobility: Walking and moving around   Mobility: Walking and Moving Around Current Status [phone]) At least 60 percent but less than 80 percent impaired, limited or restricted   Mobility: Walking and Moving Around Goal Status [phone]) At least 40 percent but less than 60 percent impaired, limited or restricted       Problem List Patient Active Problem List   Diagnosis Date  Noted  . Upper airway cough syndrome 01/31/2015  . Dyspnea 01/31/2015  . Special screening for malignant neoplasms, colon 06/01/2013  . Mild cognitive impairment with memory loss 06/01/2013  . Unsteady gait 05/08/2013  . Type 1 diabetes mellitus (HCC) 08/20/2009  . Hyperlipidemia 08/18/2008  . Essential hypertension 08/18/2008  . INTRAOCULAR LENS IMPLANT, HX OF 08/18/2008  . CATARACT EXTRACTION, RIGHT EYE, HX OF 08/18/2008  . Diabetes ([facility]CC) 08/17/2008  . CORONARY ATHEROSCLEROSIS NATIVE CORONARY ARTERY 08/13/2008    Vladimir FasterWALDRON,Ival Basquez PT, DPT 05/13/2016, 7:22 PM  [facility] [address] [place], [place], [address] Phone: [phone]   Fax:  [phone]  Name: William Aguirre MRN: [id number] Date of Birth: Oct 20, 1934

## 2016-05-18 ENCOUNTER — Encounter: Payer: Self-pay | Admitting: Physical Therapy

## 2016-05-18 ENCOUNTER — Ambulatory Visit: Payer: Medicare HMO | Attending: Neurology | Admitting: Physical Therapy

## 2016-05-18 DIAGNOSIS — M6281 Muscle weakness (generalized): Secondary | ICD-10-CM | POA: Diagnosis not present

## 2016-05-18 DIAGNOSIS — Z9181 History of falling: Secondary | ICD-10-CM | POA: Insufficient documentation

## 2016-05-18 DIAGNOSIS — R2689 Other abnormalities of gait and mobility: Secondary | ICD-10-CM | POA: Diagnosis not present

## 2016-05-18 DIAGNOSIS — R2681 Unsteadiness on feet: Secondary | ICD-10-CM | POA: Diagnosis not present

## 2016-05-18 NOTE — Therapy (Signed)
[facility] [address] [place], [place], [address] Phone: [phone]   Fax:  [phone]  Physical Therapy Treatment  Patient Details  Name: William Aguirre MRN: [id number] Date of Birth: March 11, 1935 Referring Provider: Lesly Dukes, MD  Encounter Date: 05/18/2016      PT End of Session - 05/18/16 1406    Visit Number 2   Number of Visits 17   Date for PT Re-Evaluation 07/10/16   Authorization Type Medicare G-Code & progress note every 10 visits.   PT Start Time 1403   PT Stop Time 1445   PT Time Calculation (min) 42 min   Equipment Utilized During Treatment Gait belt   Activity Tolerance Patient limited by fatigue   Behavior During Therapy WFL for tasks assessed/performed      Past Medical History:  Diagnosis Date  . Coronary artery disease    Moderate LAD, Diagonal, Circumflex disease by cath 2004  . Dementia   . Diabetes mellitus without complication (HCC)   . Hyperlipidemia   . Hypertension     Past Surgical History:  Procedure Laterality Date  . CATARACT EXTRACTION, BILATERAL      There were no vitals filed for this visit.      Subjective Assessment - 05/18/16 1406    Subjective No new complaints. No falls or pain to report.    Patient is accompained by: Family member   Limitations Lifting;Standing;Walking;House hold activities   Patient Stated Goals To improve balance quickly   Currently in Pain? No/denies   Pain Score 0-No pain           Balance Exercises - 05/18/16 1417      OTAGO PROGRAM   Head Movements Sitting;5 reps   Neck Movements Sitting;5 reps   Back Extension Standing;5 reps   Trunk Movements Standing;5 reps   Ankle Movements Sitting;10 reps   Knee Extensor 10 reps  with red band resistance   Knee Flexor 10 reps  with red band resistance   Hip ABductor 10 reps  red band around ankles   Ankle Plantorflexors 20 reps, support   Ankle Dorsiflexors 20 reps,  support   Knee Bends 10 reps, support            PT Short Term Goals - 05/13/16 1907      PT SHORT TERM GOAL #1   Title Patient demonstrates understanding of initial HEP with wife's cues. (Target Date: 06/12/2016)   Time 4   Period Weeks   Status New     PT SHORT TERM GOAL #2   Title Patient ambulates with cane scanning environment maintaining path with no balance losses. (Target Date: 06/12/2016)   Time 4   Period Weeks   Status New     PT SHORT TERM GOAL #3   Title Patient reaches 5" anteriorly and to floor safely. (Target Date: 06/12/2016)   Time 4   Period Weeks   Status New     PT SHORT TERM GOAL #4   Title Patient ambulates 200' conversing with simple cognitive task without balance loss with supervision. (Target Date: 06/12/2016)   Time 4   Period Weeks   Status New           PT Long Term Goals - 05/13/16 1912      PT LONG TERM GOAL #1   Title Patient demonstrates understanding of ongoing fitness plan including Silver Sneakers at local fitness center & HEP with wife's cueing. (Target Date: 07/10/2016)  Time 8   Period Weeks   Status New     PT LONG TERM GOAL #2   Title Activities of Balance Confidence survery improves >10% from 32.5% to indicate improved confidence. (Target Date: 07/10/2016)   Time 8   Period Weeks   Status New     PT LONG TERM GOAL #3   Title Cognitive Timed Up & Go with cane with simple naming task <15.5 sec. (Target Date: 07/10/2016)   Time 8   Period Weeks   Status New     PT LONG TERM GOAL #4   Title Berg Balance Test >40/56 to indicate lower fall risk. (Target Date: 07/10/2016)   Time 8   Period Weeks   Status New     PT LONG TERM GOAL #5   Title Functional Gait Assessment with cane >/= 12/30 to indicate lower fall risk. (Target Date: 07/10/2016)   Time 8   Period Weeks   Status New           Plan - 05/18/16 1406    Clinical Impression Statement Today's skilled session focused on initiation of OTAGO for HEP. Pt needed cues  on form and technique with exercises. Pt is progressing toward goals and should benefit from continued PT to progress toward unmet goals.    Rehab Potential Good   PT Frequency 2x / week   PT Duration 8 weeks   PT Treatment/Interventions ADLs/Self Care Home Management;DME Instruction;Gait training;Stair training;Functional mobility training;Therapeutic activities;Therapeutic exercise;Balance training;Neuromuscular re-education;Patient/family education;Orthotic Fit/Training;Manual techniques;Passive range of motion   PT Next Visit Plan add to OTAGO HEP and  add Hamstring & heelcord stretches.   Consulted and Agree with Plan of Care Patient;Family member/caregiver   Family Member Consulted wife      Patient will benefit from skilled therapeutic intervention in order to improve the following deficits and impairments:  Abnormal gait, Decreased activity tolerance, Decreased balance, Decreased cognition, Decreased endurance, Decreased knowledge of precautions, Decreased knowledge of use of DME, Decreased mobility, Impaired flexibility, Decreased strength, Postural dysfunction  Visit Diagnosis: Other abnormalities of gait and mobility  Unsteadiness on feet  Muscle weakness (generalized)  History of falling     Problem List Patient Active Problem List   Diagnosis Date Noted  . Upper airway cough syndrome 01/31/2015  . Dyspnea 01/31/2015  . Special screening for malignant neoplasms, colon 06/01/2013  . Mild cognitive impairment with memory loss 06/01/2013  . Unsteady gait 05/08/2013  . Type 1 diabetes mellitus (HCC) 08/20/2009  . Hyperlipidemia 08/18/2008  . Essential hypertension 08/18/2008  . INTRAOCULAR LENS IMPLANT, HX OF 08/18/2008  . CATARACT EXTRACTION, RIGHT EYE, HX OF 08/18/2008  . Diabetes (HCC) 08/17/2008  . CORONARY ATHEROSCLEROSIS NATIVE CORONARY ARTERY 08/13/2008    Sallyanne KusterKathy Basya Casavant, PTA, [facility] [address] [place], [place]  [id number] [phone] 05/18/16, 4:48 PM   Name: Raylene EvertsHenry S Lankford MRN: [id number] Date of Birth: December 19, 1934

## 2016-05-20 ENCOUNTER — Ambulatory Visit: Payer: Medicare HMO | Admitting: Physical Therapy

## 2016-05-20 ENCOUNTER — Encounter: Payer: Self-pay | Admitting: Physical Therapy

## 2016-05-20 DIAGNOSIS — R2689 Other abnormalities of gait and mobility: Secondary | ICD-10-CM | POA: Diagnosis not present

## 2016-05-20 DIAGNOSIS — M6281 Muscle weakness (generalized): Secondary | ICD-10-CM | POA: Diagnosis not present

## 2016-05-20 DIAGNOSIS — Z9181 History of falling: Secondary | ICD-10-CM

## 2016-05-20 DIAGNOSIS — R2681 Unsteadiness on feet: Secondary | ICD-10-CM

## 2016-05-21 NOTE — Therapy (Signed)
[facility] [address] [place], [place], [address] Phone: [phone]   Fax:  [phone]  Physical Therapy Treatment  Patient Details  Name: William Aguirre MRN: [id number] Date of Birth: Jan 17, 1935 Referring Provider: Lesly Dukes, MD  Encounter Date: 05/20/2016      PT End of Session - 05/20/16 1406    Visit Number 3   Number of Visits 17   Date for PT Re-Evaluation 07/10/16   Authorization Type Medicare G-Code & progress note every 10 visits.   PT Start Time 1402   PT Stop Time 1445   PT Time Calculation (min) 43 min   Equipment Utilized During Treatment Gait belt   Activity Tolerance Patient limited by fatigue   Behavior During Therapy WFL for tasks assessed/performed      Past Medical History:  Diagnosis Date  . Coronary artery disease    Moderate LAD, Diagonal, Circumflex disease by cath 2004  . Dementia   . Diabetes mellitus without complication (HCC)   . Hyperlipidemia   . Hypertension     Past Surgical History:  Procedure Laterality Date  . CATARACT EXTRACTION, BILATERAL      There were no vitals filed for this visit.      Subjective Assessment - 05/20/16 1406    Subjective No new complaints. No falls or pain to report.    Patient is accompained by: Family member   Patient Stated Goals To improve balance quickly   Currently in Pain? No/denies   Pain Score 0-No pain            Balance Exercises - 05/20/16 1407      OTAGO PROGRAM   Head Movements Sitting;5 reps   Neck Movements Sitting;5 reps   Back Extension Standing;5 reps   Trunk Movements Standing;5 reps   Ankle Movements Sitting;10 reps   Knee Extensor 10 reps  with red band around ankles   Knee Flexor 10 reps  with red band around ankles, UE support for balance   Hip ABductor 10 reps  with red band around ankles, UE support for balance   Ankle Plantorflexors 20 reps, support   Ankle Dorsiflexors 20 reps,  support   Knee Bends 10 reps, support   Backwards Walking Support   Walking and Turning Around Assistive device   Sideways Walking Assistive device   Tandem Stance 10 seconds, support   Tandem Walk Support   One Leg Stand 10 seconds, support   Heel Walking Support   Toe Walk Support   Sit to Stand 10 reps, bilateral support   Overall OTAGO Comments cues needed for correct ex form and technique. spouse present and edcuated on how to assist/cue/guard pt at home.            PT Short Term Goals - 05/13/16 1907      PT SHORT TERM GOAL #1   Title Patient demonstrates understanding of initial HEP with wife's cues. (Target Date: 06/12/2016)   Time 4   Period Weeks   Status New     PT SHORT TERM GOAL #2   Title Patient ambulates with cane scanning environment maintaining path with no balance losses. (Target Date: 06/12/2016)   Time 4   Period Weeks   Status New     PT SHORT TERM GOAL #3   Title Patient reaches 5" anteriorly and to floor safely. (Target Date: 06/12/2016)   Time 4   Period Weeks   Status New     PT  SHORT TERM GOAL #4   Title Patient ambulates 200' conversing with simple cognitive task without balance loss with supervision. (Target Date: 06/12/2016)   Time 4   Period Weeks   Status New           PT Long Term Goals - 05/13/16 1912      PT LONG TERM GOAL #1   Title Patient demonstrates understanding of ongoing fitness plan including Silver Sneakers at local fitness center & HEP with wife's cueing. (Target Date: 07/10/2016)   Time 8   Period Weeks   Status New     PT LONG TERM GOAL #2   Title Activities of Balance Confidence survery improves >10% from 32.5% to indicate improved confidence. (Target Date: 07/10/2016)   Time 8   Period Weeks   Status New     PT LONG TERM GOAL #3   Title Cognitive Timed Up & Go with cane with simple naming task <15.5 sec. (Target Date: 07/10/2016)   Time 8   Period Weeks   Status New     PT LONG TERM GOAL #4   Title Berg  Balance Test >40/56 to indicate lower fall risk. (Target Date: 07/10/2016)   Time 8   Period Weeks   Status New     PT LONG TERM GOAL #5   Title Functional Gait Assessment with cane >/= 12/30 to indicate lower fall risk. (Target Date: 07/10/2016)   Time 8   Period Weeks   Status New           Plan - 05/20/16 1407    Clinical Impression Statement Today's skilled session focused on review of OTAGO exercises issued at last session and on completion of OTAGO program. No issues reported in session. Pt is progressing towards goals and should benefit from continued PT to progress toward unmet goals.              Rehab Potential Good   PT Frequency 2x / week   PT Duration 8 weeks   PT Treatment/Interventions ADLs/Self Care Home Management;DME Instruction;Gait training;Stair training;Functional mobility training;Therapeutic activities;Therapeutic exercise;Balance training;Neuromuscular re-education;Patient/family education;Orthotic Fit/Training;Manual techniques;Passive range of motion   PT Next Visit Plan add Hamstring & heelcord stretches to HEP, continue to work on strengthening, balance and gait with emphasis on posture and step length.   Consulted and Agree with Plan of Care Patient;Family member/caregiver   Family Member Consulted wife      Patient will benefit from skilled therapeutic intervention in order to improve the following deficits and impairments:  Abnormal gait, Decreased activity tolerance, Decreased balance, Decreased cognition, Decreased endurance, Decreased knowledge of precautions, Decreased knowledge of use of DME, Decreased mobility, Impaired flexibility, Decreased strength, Postural dysfunction  Visit Diagnosis: Other abnormalities of gait and mobility  Unsteadiness on feet  Muscle weakness (generalized)  History of falling     Problem List Patient Active Problem List   Diagnosis Date Noted  . Upper airway cough syndrome 01/31/2015  . Dyspnea 01/31/2015  .  Special screening for malignant neoplasms, colon 06/01/2013  . Mild cognitive impairment with memory loss 06/01/2013  . Unsteady gait 05/08/2013  . Type 1 diabetes mellitus (HCC) 08/20/2009  . Hyperlipidemia 08/18/2008  . Essential hypertension 08/18/2008  . INTRAOCULAR LENS IMPLANT, HX OF 08/18/2008  . CATARACT EXTRACTION, RIGHT EYE, HX OF 08/18/2008  . Diabetes (HCC) 08/17/2008  . CORONARY ATHEROSCLEROSIS NATIVE CORONARY ARTERY 08/13/2008    Sallyanne KusterKathy Bury, PTA, [facility] [address], [address] [place], [place] [address]  [phone] 05/21/16, 1:31 PM   Name: William Aguirre MRN: [id number] Date of Birth: 1935-02-22

## 2016-05-22 ENCOUNTER — Other Ambulatory Visit: Payer: Self-pay | Admitting: Cardiovascular Disease

## 2016-05-25 ENCOUNTER — Ambulatory Visit: Payer: Medicare HMO | Admitting: Physical Therapy

## 2016-05-27 ENCOUNTER — Encounter: Payer: Self-pay | Admitting: Physical Therapy

## 2016-05-27 ENCOUNTER — Ambulatory Visit: Payer: Medicare HMO | Admitting: Physical Therapy

## 2016-05-27 DIAGNOSIS — R2689 Other abnormalities of gait and mobility: Secondary | ICD-10-CM

## 2016-05-27 DIAGNOSIS — R2681 Unsteadiness on feet: Secondary | ICD-10-CM | POA: Diagnosis not present

## 2016-05-27 DIAGNOSIS — Z9181 History of falling: Secondary | ICD-10-CM

## 2016-05-27 DIAGNOSIS — M6281 Muscle weakness (generalized): Secondary | ICD-10-CM

## 2016-05-27 NOTE — Therapy (Signed)
[facility] [address] [place], [place], [address] Phone: [phone]   Fax:  [phone]  Physical Therapy Treatment  Patient Details  Name: William Aguirre MRN: [id number] Date of Birth: 09/21/1934 Referring Provider: Lesly Dukes, MD  Encounter Date: 05/27/2016      PT End of Session - 05/27/16 1407    Visit Number 4   Number of Visits 17   Date for PT Re-Evaluation 07/10/16   Authorization Type Medicare G-Code & progress note every 10 visits.   PT Start Time 1405   PT Stop Time 1445   PT Time Calculation (min) 40 min   Equipment Utilized During Treatment Gait belt   Activity Tolerance Patient limited by fatigue   Behavior During Therapy WFL for tasks assessed/performed      Past Medical History:  Diagnosis Date  . Coronary artery disease    Moderate LAD, Diagonal, Circumflex disease by cath 2004  . Dementia   . Diabetes mellitus without complication (HCC)   . Hyperlipidemia   . Hypertension     Past Surgical History:  Procedure Laterality Date  . CATARACT EXTRACTION, BILATERAL      There were no vitals filed for this visit.      Subjective Assessment - 05/27/16 1407    Subjective No new complaints. No falls or pain to report.    Patient is accompained by: Family member   Limitations Lifting;Standing;Walking;House hold activities   Patient Stated Goals To improve balance quickly   Currently in Pain? No/denies   Pain Score 0-No pain             [facility] Adult PT Treatment/Exercise - 05/27/16 1408      Transfers   Transfers Sit to Stand;Stand to Sit   Sit to Stand 5: Supervision;With upper extremity assist;With armrests;From chair/3-in-1   Stand to Sit 5: Supervision;With upper extremity assist;With armrests;To chair/3-in-1;Uncontrolled descent     Ambulation/Gait   Ambulation/Gait Yes   Ambulation/Gait Assistance 4: Min guard   Ambulation/Gait Assistance Details had pt  scanning enviroment with min guard assist: cues to keep walking while scanning., decreased gait speed with veering noted when scanning.                          Ambulation Distance (Feet) 215 Feet  x2   Assistive device Straight cane   Gait Pattern Step-through pattern;Decreased arm swing - left;Decreased step length - right;Decreased stance time - left;Decreased stride length;Decreased hip/knee flexion - left;Decreased dorsiflexion - left;Lateral hip instability;Trunk flexed;Wide base of support;Poor foot clearance - left   Ambulation Surface Level;Indoor     High Level Balance   High Level Balance Activities Marching forwards;Marching backwards;Side stepping;Tandem walking  tandem gait fwd/bwd   High Level Balance Comments red mats next to counter top: 3 laps each/each way with min/mod assist for balance with 1 UE support on counter, cues on posture, weight shifting and to slow down for safety/increased balance                     Knee/Hip Exercises: Aerobic   Recumbent Bike Scifit with UE's/LE's level 1.5 x 8 minutes with goal rpm >/= 60 for strengthening and activity tolerance, cues on posture needed at times            PT Education - 05/27/16 1658    Education provided Yes   Education Details reinforced compliance with OTAGO HEP as pt is not  performing any portions at home. Educated pt/spouse on importance of exercises out of therapy as that seeing [place] 2xweek is not enough for pt to make significant improvements.    Person(s) Educated Patient;Spouse   Methods Explanation;Demonstration;Verbal cues   Comprehension Verbalized understanding;Returned demonstration;Need further instruction;Verbal cues required          PT Short Term Goals - 05/13/16 1907      PT SHORT TERM GOAL #1   Title Patient demonstrates understanding of initial HEP with wife's cues. (Target Date: 06/12/2016)   Time 4   Period Weeks   Status New     PT SHORT TERM GOAL #2   Title Patient ambulates with cane  scanning environment maintaining path with no balance losses. (Target Date: 06/12/2016)   Time 4   Period Weeks   Status New     PT SHORT TERM GOAL #3   Title Patient reaches 5" anteriorly and to floor safely. (Target Date: 06/12/2016)   Time 4   Period Weeks   Status New     PT SHORT TERM GOAL #4   Title Patient ambulates 200' conversing with simple cognitive task without balance loss with supervision. (Target Date: 06/12/2016)   Time 4   Period Weeks   Status New           PT Long Term Goals - 05/13/16 1912      PT LONG TERM GOAL #1   Title Patient demonstrates understanding of ongoing fitness plan including Silver Sneakers at local fitness center & HEP with wife's cueing. (Target Date: 07/10/2016)   Time 8   Period Weeks   Status New     PT LONG TERM GOAL #2   Title Activities of Balance Confidence survery improves >10% from 32.5% to indicate improved confidence. (Target Date: 07/10/2016)   Time 8   Period Weeks   Status New     PT LONG TERM GOAL #3   Title Cognitive Timed Up & Go with cane with simple naming task <15.5 sec. (Target Date: 07/10/2016)   Time 8   Period Weeks   Status New     PT LONG TERM GOAL #4   Title Berg Balance Test >40/56 to indicate lower fall risk. (Target Date: 07/10/2016)   Time 8   Period Weeks   Status New     PT LONG TERM GOAL #5   Title Functional Gait Assessment with cane >/= 12/30 to indicate lower fall risk. (Target Date: 07/10/2016)   Time 8   Period Weeks   Status New            Plan - 05/27/16 1407    Clinical Impression Statement today's skilled session focused on gait and balance. Pt needs encouragement to participate with any exercise/activities performed. Does fatigue quickly as well, needing short rest breaks. Pt is making slow progress toward goals and should benefit from continued PT to progress toward unmet goals .   Rehab Potential Good   PT Frequency 2x / week   PT Duration 8 weeks   PT Treatment/Interventions  ADLs/Self Care Home Management;DME Instruction;Gait training;Stair training;Functional mobility training;Therapeutic activities;Therapeutic exercise;Balance training;Neuromuscular re-education;Patient/family education;Orthotic Fit/Training;Manual techniques;Passive range of motion   PT Next Visit Plan add Hamstring & heelcord stretches to HEP, continue to work on strengthening, balance and gait with emphasis on posture and step length.   Consulted and Agree with Plan of Care Patient;Family member/caregiver   Family Member Consulted wife      Patient will benefit from skilled therapeutic  intervention in order to improve the following deficits and impairments:  Abnormal gait, Decreased activity tolerance, Decreased balance, Decreased cognition, Decreased endurance, Decreased knowledge of precautions, Decreased knowledge of use of DME, Decreased mobility, Impaired flexibility, Decreased strength, Postural dysfunction  Visit Diagnosis: Other abnormalities of gait and mobility  Unsteadiness on feet  Muscle weakness (generalized)  History of falling     Problem List Patient Active Problem List   Diagnosis Date Noted  . Upper airway cough syndrome 01/31/2015  . Dyspnea 01/31/2015  . Special screening for malignant neoplasms, colon 06/01/2013  . Mild cognitive impairment with memory loss 06/01/2013  . Unsteady gait 05/08/2013  . Type 1 diabetes mellitus (HCC) 08/20/2009  . Hyperlipidemia 08/18/2008  . Essential hypertension 08/18/2008  . INTRAOCULAR LENS IMPLANT, HX OF 08/18/2008  . CATARACT EXTRACTION, RIGHT EYE, HX OF 08/18/2008  . Diabetes (HCC) 08/17/2008  . CORONARY ATHEROSCLEROSIS NATIVE CORONARY ARTERY 08/13/2008   Sallyanne KusterKathy Shealeigh Dunstan, PTA, [facility] [address], [address] [place], [place] [address] [phone] 05/27/16, 5:02 PM   Name: William Aguirre MRN: [id number] Date of Birth: [birth year]

## 2016-06-01 ENCOUNTER — Ambulatory Visit: Payer: Medicare HMO | Admitting: Physical Therapy

## 2016-06-01 ENCOUNTER — Encounter: Payer: Self-pay | Admitting: Physical Therapy

## 2016-06-01 DIAGNOSIS — R2689 Other abnormalities of gait and mobility: Secondary | ICD-10-CM | POA: Diagnosis not present

## 2016-06-01 DIAGNOSIS — R2681 Unsteadiness on feet: Secondary | ICD-10-CM

## 2016-06-01 DIAGNOSIS — Z9181 History of falling: Secondary | ICD-10-CM | POA: Diagnosis not present

## 2016-06-01 DIAGNOSIS — M6281 Muscle weakness (generalized): Secondary | ICD-10-CM

## 2016-06-01 NOTE — Therapy (Signed)
[facility] [address] [place], [place], [address] Phone: [phone]   Fax:  [phone]  Physical Therapy Treatment  Patient Details  Name: William Aguirre MRN: [id number] Date of Birth: 1935-01-22 Referring Provider: Lesly Dukes, MD  Encounter Date: 06/01/2016      PT End of Session - 06/01/16 1412    Visit Number 5   Number of Visits 17   Date for PT Re-Evaluation 07/10/16   Authorization Type Medicare G-Code & progress note every 10 visits.   PT Start Time 1405   PT Stop Time 1447   PT Time Calculation (min) 42 min   Equipment Utilized During Treatment Gait belt   Activity Tolerance Patient limited by fatigue   Behavior During Therapy WFL for tasks assessed/performed      Past Medical History:  Diagnosis Date  . Coronary artery disease    Moderate LAD, Diagonal, Circumflex disease by cath 2004  . Dementia   . Diabetes mellitus without complication (HCC)   . Hyperlipidemia   . Hypertension     Past Surgical History:  Procedure Laterality Date  . CATARACT EXTRACTION, BILATERAL      There were no vitals filed for this visit.      Subjective Assessment - 06/01/16 1410    Subjective No new complaints. No falls or pain to report. Still not doing HEP at home per pt and spouse report. Spouse reports he sleeps in recliner all day.   Patient is accompained by: Family member   Limitations Lifting;Standing;Walking;House hold activities   Patient Stated Goals To improve balance quickly   Currently in Pain? No/denies   Pain Score 0-No pain             [facility] Adult PT Treatment/Exercise - 06/01/16 1412      Transfers   Transfers Sit to Stand;Stand to Sit   Sit to Stand 5: Supervision;With upper extremity assist;With armrests;From bed   Stand to Sit 5: Supervision;With upper extremity assist;With armrests;Uncontrolled descent;To bed     Ambulation/Gait   Ambulation/Gait Yes    Ambulation/Gait Assistance 4: Min guard;5: Supervision   Ambulation/Gait Assistance Details cues for increased step length, arm swing and posture with gait. no LOB or instability noted with gait   Ambulation Distance (Feet) 650 Feet   Assistive device Straight cane   Gait Pattern Step-through pattern;Decreased arm swing - left;Decreased step length - right;Decreased stance time - left;Decreased stride length;Decreased hip/knee flexion - left;Decreased dorsiflexion - left;Lateral hip instability;Trunk flexed;Wide base of support;Poor foot clearance - left   Ambulation Surface Level;Indoor   Ramp 4: Min assist;Other (comment)  progressing to min guard assist with cane   Ramp Details (indicate cue type and reason) x 3 reps, cues on posture, step length and cane placement   Curb 4: Min assist;Other (comment)  progressing to min guard assist   Curb Details (indicate cue type and reason) x 3 reps, progressing to min guard assist. cues on sequencing and safety with cane     High Level Balance   High Level Balance Activities Side stepping;Marching forwards;Marching backwards;Tandem walking  tandem fwd/bwd, toe walk fwd/bwd   High Level Balance Comments red mats next to counter top: 3 laps each/each way with min assist for balance with 1 UE support on counter, cues on posture, weight shifting and to slow down for safety/increased balance                     Knee/Hip Exercises:  Aerobic   Recumbent Bike Scifit with UE's/LE's level 2.0 x 8 minutes with goal rpm >/= 60 for strengthening and activity tolerance, cues on posture needed at times            PT Short Term Goals - 05/13/16 1907      PT SHORT TERM GOAL #1   Title Patient demonstrates understanding of initial HEP with wife's cues. (Target Date: 06/12/2016)   Time 4   Period Weeks   Status New     PT SHORT TERM GOAL #2   Title Patient ambulates with cane scanning environment maintaining path with no balance losses. (Target Date: 06/12/2016)    Time 4   Period Weeks   Status New     PT SHORT TERM GOAL #3   Title Patient reaches 5" anteriorly and to floor safely. (Target Date: 06/12/2016)   Time 4   Period Weeks   Status New     PT SHORT TERM GOAL #4   Title Patient ambulates 200' conversing with simple cognitive task without balance loss with supervision. (Target Date: 06/12/2016)   Time 4   Period Weeks   Status New           PT Long Term Goals - 05/13/16 1912      PT LONG TERM GOAL #1   Title Patient demonstrates understanding of ongoing fitness plan including Silver Sneakers at local fitness center & HEP with wife's cueing. (Target Date: 07/10/2016)   Time 8   Period Weeks   Status New     PT LONG TERM GOAL #2   Title Activities of Balance Confidence survery improves >10% from 32.5% to indicate improved confidence. (Target Date: 07/10/2016)   Time 8   Period Weeks   Status New     PT LONG TERM GOAL #3   Title Cognitive Timed Up & Go with cane with simple naming task <15.5 sec. (Target Date: 07/10/2016)   Time 8   Period Weeks   Status New     PT LONG TERM GOAL #4   Title Berg Balance Test >40/56 to indicate lower fall risk. (Target Date: 07/10/2016)   Time 8   Period Weeks   Status New     PT LONG TERM GOAL #5   Title Functional Gait Assessment with cane >/= 12/30 to indicate lower fall risk. (Target Date: 07/10/2016)   Time 8   Period Weeks   Status New            Plan - 06/01/16 1412    Clinical Impression Statement Today's skilled session continued to focus on gait, balance and LE strengthening. Pt continues to fatigue quickly, needing seated rest breaks between activities. Pt also needs encouragement to fully participate in activity. Pt's spouse advised she may need to remind him to do his HEP and not expect him to "just remember and do it". She stated she understood as she reminds him to take his medication as well. Pt is making slow progress toward goals and should benefit from continued PT to  progress toward unmet goals.                   Rehab Potential Good   PT Frequency 2x / week   PT Duration 8 weeks   PT Treatment/Interventions ADLs/Self Care Home Management;DME Instruction;Gait training;Stair training;Functional mobility training;Therapeutic activities;Therapeutic exercise;Balance training;Neuromuscular re-education;Patient/family education;Orthotic Fit/Training;Manual techniques;Passive range of motion   PT Next Visit Plan continue to work on strengthening, balance and gait with emphasis  on posture and step length.   Consulted and Agree with Plan of Care Patient;Family member/caregiver   Family Member Consulted wife      Patient will benefit from skilled therapeutic intervention in order to improve the following deficits and impairments:  Abnormal gait, Decreased activity tolerance, Decreased balance, Decreased cognition, Decreased endurance, Decreased knowledge of precautions, Decreased knowledge of use of DME, Decreased mobility, Impaired flexibility, Decreased strength, Postural dysfunction  Visit Diagnosis: Other abnormalities of gait and mobility  Unsteadiness on feet  Muscle weakness (generalized)  History of falling     Problem List Patient Active Problem List   Diagnosis Date Noted  . Upper airway cough syndrome 01/31/2015  . Dyspnea 01/31/2015  . Special screening for malignant neoplasms, colon 06/01/2013  . Mild cognitive impairment with memory loss 06/01/2013  . Unsteady gait 05/08/2013  . Type 1 diabetes mellitus (HCC) 08/20/2009  . Hyperlipidemia 08/18/2008  . Essential hypertension 08/18/2008  . INTRAOCULAR LENS IMPLANT, HX OF 08/18/2008  . CATARACT EXTRACTION, RIGHT EYE, HX OF 08/18/2008  . Diabetes (HCC) 08/17/2008  . CORONARY ATHEROSCLEROSIS NATIVE CORONARY ARTERY 08/13/2008    Sallyanne KusterKathy Elham Fini, PTA, [facility] [address], [address] [place], [place] [address] [phone] 06/01/16, 3:28 PM   Name: Raylene EvertsHenry S  Crothers MRN: [id number] Date of Birth: [birth year]

## 2016-06-03 ENCOUNTER — Ambulatory Visit: Payer: Medicare HMO | Admitting: Physical Therapy

## 2016-06-04 ENCOUNTER — Ambulatory Visit (INDEPENDENT_AMBULATORY_CARE_PROVIDER_SITE_OTHER): Payer: Medicare HMO | Admitting: Adult Health

## 2016-06-04 ENCOUNTER — Encounter: Payer: Self-pay | Admitting: Adult Health

## 2016-06-04 VITALS — BP 142/76 | Temp 97.8°F | Wt 203.2 lb

## 2016-06-04 DIAGNOSIS — I10 Essential (primary) hypertension: Secondary | ICD-10-CM

## 2016-06-04 MED ORDER — BISOPROLOL FUMARATE 5 MG PO TABS: 5.0000 mg | ORAL_TABLET | Freq: Two times a day (BID) | ORAL | 3 refills | Status: DC

## 2016-06-04 NOTE — Progress Notes (Signed)
Subjective:    Patient ID: William Aguirre, male    DOB: 01/26/1935, 82 y.o.   MRN: [id number]  HPI   82 year old male who presents to the office today for concern of elevated blood pressure readings. Per patients wife, she has been monitoring William Aguirre's blood pressure and has noticed that in the evening his blood pressure becomes elevated. Per his blood pressure cuff  William Aguirre's readings have consistently been in the 160's/100's in the evening.   He is currently taking Zebeta 5 mg daily for blood pressure control.   He denies any headaches or blurred vision. Over all he feels " really good."   BP Readings from Last 3 Encounters:  06/04/16 (!) 142/76  05/04/16 123/74  03/25/16 130/82   Review of Systems See HPI  Past Medical History:  Diagnosis Date  . Coronary artery disease    Moderate LAD, Diagonal, Circumflex disease by cath 2004  . Dementia   . Diabetes mellitus without complication ([place])   . Hyperlipidemia   . Hypertension     Social History   Social History  . Marital status: Married    Spouse name: N/A  . Number of children: 4  . Years of education: CPA   Occupational History  . Accountant     Retired   Social History Main Topics  . Smoking status: Former Smoker    Years: 3.00    Quit date: 04/13/1965  . Smokeless tobacco: Never Used  . Alcohol use No  . Drug use: No  . Sexual activity: Not on file   Other Topics Concern  . Not on file   Social History Narrative   Lives with wife in a one story home.   Retired Engineer, maintenance (IT).   Right-handed   Caffeine: 1 cup per day       Past Surgical History:  Procedure Laterality Date  . CATARACT EXTRACTION, BILATERAL      Family History  Problem Relation Age of Onset  . Other Father     Deceased  . Other Mother     Deceased, 49  . Other Sister     Deceased  . Cancer Sister     Deceased  . Healthy Son     Living  . Heart disease Son     Deceased, 19 from heart valve dysfunction  . Healthy Daughter      Allergies  Allergen Reactions  . Hydrocodone-Acetaminophen Other (See Comments)     Delusions    Current Outpatient Prescriptions on File Prior to Visit  Medication Sig Dispense Refill  . ACCU-CHEK FASTCLIX LANCETS MISC Use to check blood sugar 2 times per day. 100 each 2  . ACCU-CHEK SOFTCLIX LANCETS lancets USE TO TEST BLOOD SUGAR TWICE DAILY 100 each 2  . atorvastatin (LIPITOR) 80 MG tablet TAKE 1 TABLET BY MOUTH ONCE DAILY 30 tablet 8  . Blood Glucose Monitoring Suppl (ACCU-CHEK AVIVA PLUS) w/Device KIT Use to check blood sugar two times per day. 1 kit 2  . clopidogrel (PLAVIX) 75 MG tablet TAKE 1 TABLET BY MOUTH ONCE DAILY WITH BREAKFAST 90 tablet 3  . glucose blood (ACCU-CHEK AVIVA PLUS) test strip Use to check blood sugar 2 times per day. 100 each 5  . glucose blood (ONETOUCH VERIO) test strip Use to check blood sugar 2 times per day. 100 each 5  . Insulin Glargine (LANTUS SOLOSTAR) 100 UNIT/ML Solostar Pen Inject 75 Units into the skin every morning. And pen needles 1/day 30 mL 11  .  Melatonin 3 MG TABS Take 3 mg by mouth at bedtime.    Marland Kitchen omeprazole (PRILOSEC OTC) 20 MG tablet Take 20 mg by mouth daily.    Marland Kitchen SM ASPIRIN ADULT LOW STRENGTH 81 MG EC tablet TAKE 1 TABLET (81MG) BY MOUTH ONCE DAILY 90 tablet 2  . traMADol (ULTRAM) 50 MG tablet Take 1 tablet (50 mg total) by mouth 2 (two) times daily as needed for severe pain. 180 tablet 0  . valsartan (DIOVAN) 80 MG tablet Take 1 tablet (80 mg total) by mouth daily. 90 tablet 0   No current facility-administered medications on file prior to visit.     BP (!) 142/76 (BP Location: Left Arm, Patient Position: Sitting, Cuff Size: Normal)   Temp 97.8 F (36.6 C) (Oral)   Wt 203 lb 3.2 oz (92.2 kg)   BMI 31.83 kg/m       Objective:   Physical Exam  Constitutional: He is oriented to person, place, and time. He appears well-developed and well-nourished. No distress.  Eyes: Conjunctivae and EOM are normal. Pupils are equal,  round, and reactive to light. Right eye exhibits no discharge. Left eye exhibits no discharge. No scleral icterus.  Cardiovascular: Normal rate, regular rhythm, normal heart sounds and intact distal pulses.  Exam reveals no gallop and no friction rub.   No murmur heard. Pulmonary/Chest: Effort normal and breath sounds normal. No respiratory distress. He has no wheezes. He has no rales. He exhibits no tenderness.  Neurological: He is alert and oriented to person, place, and time. He has normal reflexes.  Skin: Skin is warm and dry. No rash noted. He is not diaphoretic. No erythema. No pallor.  Psychiatric: He has a normal mood and affect. His behavior is normal. Thought content normal.  Nursing note and vitals reviewed.     Assessment & Plan:  1. Essential hypertension - Will have him take Zebeta 5 mg BID. Monitor blood pressure at home. Return precautions given  - bisoprolol (ZEBETA) 5 MG tablet; Take 1 tablet (5 mg total) by mouth 2 (two) times daily.  Dispense: 60 tablet; Refill: 3  Dorothyann Peng, NP

## 2016-06-05 ENCOUNTER — Telehealth: Payer: Self-pay | Admitting: Adult Health

## 2016-06-05 NOTE — Telephone Encounter (Signed)
Pharm needs verification on bisoprolol 5 mg . Please call [phone] and ask for roshini

## 2016-06-05 NOTE — Telephone Encounter (Signed)
I called and spoke with pharmacist at [facility] - I advised them of directions on Bisoprolol. Per Cory's last note, patient is to take one 5mg  tablet bid.  Pharmacist verbalized understanding. Nothing further needed. Thanks!

## 2016-06-08 ENCOUNTER — Ambulatory Visit: Payer: Medicare HMO | Admitting: Physical Therapy

## 2016-06-09 ENCOUNTER — Telehealth: Payer: Self-pay | Admitting: Endocrinology

## 2016-06-09 ENCOUNTER — Encounter: Payer: Self-pay | Admitting: Endocrinology

## 2016-06-09 ENCOUNTER — Ambulatory Visit (INDEPENDENT_AMBULATORY_CARE_PROVIDER_SITE_OTHER): Payer: Medicare HMO | Admitting: Endocrinology

## 2016-06-09 VITALS — BP 126/62 | HR 75 | Ht 67.0 in | Wt 205.0 lb

## 2016-06-09 DIAGNOSIS — E109 Type 1 diabetes mellitus without complications: Secondary | ICD-10-CM | POA: Diagnosis not present

## 2016-06-09 LAB — POCT GLYCOSYLATED HEMOGLOBIN (HGB A1C): HEMOGLOBIN A1C: 7.4

## 2016-06-09 MED ORDER — GLUCOSE BLOOD VI STRP: 1.0000 | ORAL_STRIP | Freq: Two times a day (BID) | 12 refills | Status: DC

## 2016-06-09 NOTE — Progress Notes (Signed)
Subjective:    Patient ID: William Aguirre, male    DOB: [birth year], 82 y.o.   MRN: [id number]  HPI Pt returns for f/u of diabetes mellitus: DM type: Insulin-requiring type 2.   Dx'ed: 1986 Complications: CAD and polyneuropathy.   Therapy: insulin since soon after dx.  DKA: never Severe hypoglycemia: never Pancreatitis: never Other: he takes QD insulin, due to memory loss.   Interval history:  pt has memory loss, and is here with wife today.  pt states he feels well in general.  pt says he never misses insulin injections.  He now takes 75 units qam.   Past Medical History:  Diagnosis Date  . Coronary artery disease    Moderate LAD, Diagonal, Circumflex disease by cath 2004  . Dementia   . Diabetes mellitus without complication (HCC)   . Hyperlipidemia   . Hypertension     Past Surgical History:  Procedure Laterality Date  . CATARACT EXTRACTION, BILATERAL      Social History   Social History  . Marital status: Married    Spouse name: N/A  . Number of children: 4  . Years of education: CPA   Occupational History  . Accountant     Retired   Social History Main Topics  . Smoking status: Former Smoker    Years: 3.00    Quit date: 04/13/1965  . Smokeless tobacco: Never Used  . Alcohol use No  . Drug use: No  . Sexual activity: Not on file   Other Topics Concern  . Not on file   Social History Narrative   Lives with wife in a one story home.   Retired IT trainer.   Right-handed   Caffeine: 1 cup per day       Current Outpatient Prescriptions on File Prior to Visit  Medication Sig Dispense Refill  . ACCU-CHEK FASTCLIX LANCETS MISC Use to check blood sugar 2 times per day. 100 each 2  . ACCU-CHEK SOFTCLIX LANCETS lancets USE TO TEST BLOOD SUGAR TWICE DAILY 100 each 2  . atorvastatin (LIPITOR) 80 MG tablet TAKE 1 TABLET BY MOUTH ONCE DAILY 30 tablet 8  . bisoprolol (ZEBETA) 5 MG tablet Take 1 tablet (5 mg total) by mouth 2 (two) times daily. 60 tablet 3  .  clopidogrel (PLAVIX) 75 MG tablet TAKE 1 TABLET BY MOUTH ONCE DAILY WITH BREAKFAST 90 tablet 3  . Insulin Glargine (LANTUS SOLOSTAR) 100 UNIT/ML Solostar Pen Inject 75 Units into the skin every morning. And pen needles 1/day 30 mL 11  . Melatonin 3 MG TABS Take 3 mg by mouth at bedtime.    Marland Kitchen omeprazole (PRILOSEC OTC) 20 MG tablet Take 20 mg by mouth daily.    Marland Kitchen SM ASPIRIN ADULT LOW STRENGTH 81 MG EC tablet TAKE 1 TABLET (81MG ) BY MOUTH ONCE DAILY 90 tablet 2  . traMADol (ULTRAM) 50 MG tablet Take 1 tablet (50 mg total) by mouth 2 (two) times daily as needed for severe pain. 180 tablet 0  . valsartan (DIOVAN) 80 MG tablet Take 1 tablet (80 mg total) by mouth daily. 90 tablet 0   No current facility-administered medications on file prior to visit.     Allergies  Allergen Reactions  . Hydrocodone-Acetaminophen Other (See Comments)     Delusions    Family History  Problem Relation Age of Onset  . Other Father     Deceased  . Other Mother     Deceased, 21  . Other Sister  Deceased  . Cancer Sister     Deceased  . Healthy Son     Living  . Heart disease Son     Deceased, 30 from heart valve dysfunction  . Healthy Daughter     BP 126/62   Pulse 75   Ht 5\' 7"  (1.702 m)   Wt 205 lb (93 kg)   SpO2 95%   BMI 32.11 kg/m    Review of Systems He denies hypoglycemia.     Objective:   Physical Exam VITAL SIGNS:  See vs page GENERAL: no distress Pulses: dorsalis pedis intact bilat.   MSK: no deformity of the feet CV: no leg edema Skin:  no ulcer on the feet.  normal color and temp on the feet.  Neuro: sensation is intact to touch on the feet, but decreased from normal.    A1c=7.4%    Assessment & Plan:  Insulin-requiring type 2 DM, with CAD: this is the best control this pt should aim for, given advanced age, and this regimen, which does match insulin to his changing needs throughout the day.    Patient is advised the following: Patient Instructions  check your blood  sugar twice a day.  vary the time of day when you check, between before the 3 meals, and at bedtime.  also check if you have symptoms of your blood sugar being too high or too low.  please keep a record of the readings and bring it to your next appointment here (or you can bring the meter itself).  You can write it on any piece of paper.  please call us sooner if your blood sugar goes below 70, or if you have a lot of readings over 200. Please continue the same lantus.  Please come back for a follow-up appointment in 3 months.

## 2016-06-09 NOTE — Telephone Encounter (Signed)
Pharmacy is asking if it was an accident to call the one touch in and not the accu check, per the pharmacy it is the accu check that the pt has please advise

## 2016-06-09 NOTE — Patient Instructions (Addendum)
check your blood sugar twice a day.  vary the time of day when you check, between before the 3 meals, and at bedtime.  also check if you have symptoms of your blood sugar being too high or too low.  please keep a record of the readings and bring it to your next appointment here (or you can bring the meter itself).  You can write it on any piece of paper.  please call us sooner if your blood sugar goes below 70, or if you have a lot of readings over 200. Please continue the same lantus.  Please come back for a follow-up appointment in 3 months.

## 2016-06-10 ENCOUNTER — Ambulatory Visit: Payer: Commercial Managed Care - HMO | Admitting: Physical Therapy

## 2016-06-10 MED ORDER — GLUCOSE BLOOD VI STRP: 1.0000 | ORAL_STRIP | Freq: Two times a day (BID) | 12 refills | Status: DC

## 2016-06-10 NOTE — Telephone Encounter (Signed)
no accident.  Pt requested to change

## 2016-06-10 NOTE — Telephone Encounter (Signed)
Pharmacy notified.

## 2016-06-10 NOTE — Telephone Encounter (Signed)
See message and please advise, Thanks!  

## 2016-06-12 ENCOUNTER — Ambulatory Visit: Payer: Medicare HMO | Attending: Neurology | Admitting: Physical Therapy

## 2016-06-12 DIAGNOSIS — Z9181 History of falling: Secondary | ICD-10-CM | POA: Insufficient documentation

## 2016-06-12 DIAGNOSIS — M6281 Muscle weakness (generalized): Secondary | ICD-10-CM | POA: Insufficient documentation

## 2016-06-12 DIAGNOSIS — R2689 Other abnormalities of gait and mobility: Secondary | ICD-10-CM | POA: Insufficient documentation

## 2016-06-12 DIAGNOSIS — R2681 Unsteadiness on feet: Secondary | ICD-10-CM | POA: Insufficient documentation

## 2016-06-15 ENCOUNTER — Encounter: Payer: Self-pay | Admitting: Physical Therapy

## 2016-06-15 ENCOUNTER — Other Ambulatory Visit: Payer: Self-pay

## 2016-06-15 ENCOUNTER — Ambulatory Visit: Payer: Medicare HMO | Admitting: Physical Therapy

## 2016-06-15 DIAGNOSIS — M6281 Muscle weakness (generalized): Secondary | ICD-10-CM | POA: Diagnosis not present

## 2016-06-15 DIAGNOSIS — R2689 Other abnormalities of gait and mobility: Secondary | ICD-10-CM | POA: Diagnosis not present

## 2016-06-15 DIAGNOSIS — R2681 Unsteadiness on feet: Secondary | ICD-10-CM | POA: Diagnosis not present

## 2016-06-15 DIAGNOSIS — Z9181 History of falling: Secondary | ICD-10-CM

## 2016-06-15 MED ORDER — GLUCOSE BLOOD VI STRP: ORAL_STRIP | 12 refills | Status: DC

## 2016-06-15 NOTE — Therapy (Signed)
[facility] [address] [place], [place], [address] Phone: [phone]   Fax:  [phone]  Physical Therapy Treatment  Patient Details  Name: William Aguirre MRN: [id number] Date of Birth: 1934/12/04 Referring Provider: Lenor Coffin, MD  Encounter Date: 06/15/2016      PT End of Session - 06/15/16 1446    Visit Number 6   Number of Visits 17   Date for PT Re-Evaluation 07/10/16   Authorization Type Medicare G-Code & progress note every 10 visits.   PT Start Time 1402   PT Stop Time 1450   PT Time Calculation (min) 48 min   Equipment Utilized During Treatment Gait belt   Activity Tolerance Patient limited by fatigue;Patient tolerated treatment well;No increased pain   Behavior During Therapy WFL for tasks assessed/performed      Past Medical History:  Diagnosis Date  . Coronary artery disease    Moderate LAD, Diagonal, Circumflex disease by cath 2004  . Dementia   . Diabetes mellitus without complication ([place])   . Hyperlipidemia   . Hypertension     Past Surgical History:  Procedure Laterality Date  . CATARACT EXTRACTION, BILATERAL      There were no vitals filed for this visit.      Subjective Assessment - 06/15/16 1408    Subjective No new complaints. No falls to report. Having some LE pain, pt unable to state how pain started.    Patient is accompained by: Family member  spouse   Limitations Lifting;Standing;Walking;House hold activities   Patient Stated Goals To improve balance quickly   Currently in Pain? Yes   Pain Score --  "they're sore"   Pain Location Leg   Pain Orientation Right;Left   Pain Descriptors / Indicators Sore   Pain Type Other (Comment)   Pain Onset In the past 7 days   Pain Frequency Occasional   Aggravating Factors  unknown   Pain Relieving Factors elevating legs            [facility] PT Assessment - 06/15/16 1412      Ambulation/Gait   Ambulation/Gait Yes    Ambulation/Gait Assistance 5: Supervision   Ambulation/Gait Assistance Details gait while engaged in converstation- multiple topics attempted with pt having only 1-2 word responses until topic of golf came up and pt became more conversive. no balance issues noted.    Ambulation Distance (Feet) 200 Feet   Assistive device Standard walker   Gait Pattern Step-through pattern;Decreased arm swing - left;Decreased step length - right;Decreased stance time - left;Decreased stride length;Decreased hip/knee flexion - left;Decreased dorsiflexion - left;Lateral hip instability;Trunk flexed;Wide base of support;Poor foot clearance - left   Ambulation Surface Level;Indoor     Berg Balance Test   Sit to Stand Able to stand without using hands and stabilize independently   Standing Unsupported Able to stand safely 2 minutes   Sitting with Back Unsupported but Feet Supported on Floor or Stool Able to sit safely and securely 2 minutes   Stand to Sit Controls descent by using hands   Transfers Able to transfer safely, definite need of hands   Standing Unsupported with Eyes Closed Able to stand 10 seconds safely   Standing Ubsupported with Feet Together Able to place feet together independently and stand for 1 minute with supervision   From Standing, Reach Forward with Outstretched Arm Can reach forward >5 cm safely (2")  3 inches   From Standing Position, Pick up Object from [place]  to pick up shoe safely and easily   From Standing Position, Turn to Look Behind Over each Shoulder Looks behind from both sides and weight shifts well   Turn 360 Degrees Able to turn 360 degrees safely but slowly  > 7 seconds each way   Standing Unsupported, Alternately Place Feet on Step/Stool Able to stand independently and safely and complete 8 steps in 20 seconds  </= 17 sec   Standing Unsupported, One Foot in Front Able to take small step independently and hold 30 seconds  foot did not pass other foot   Standing on One  Leg Tries to lift leg/unable to hold 3 seconds but remains standing independently   Total Score 44   Berg comment: 44/56= significant (>80%) risk of fall     Timed Up and Go Test   Normal TUG (seconds) 14.72  cane   Cognitive TUG (seconds) 17.47  with cane/naming task   TUG Comments had pt perform animal naming      Functional Gait  Assessment   Gait assessed  Yes   Gait Level Surface Walks 20 ft in less than 7 sec but greater than 5.5 sec, uses assistive device, slower speed, mild gait deviations, or deviates 6-10 in outside of the 12 in walkway width.   Change in Gait Speed Able to change speed, demonstrates mild gait deviations, deviates 6-10 in outside of the 12 in walkway width, or no gait deviations, unable to achieve a major change in velocity, or uses a change in velocity, or uses an assistive device.   Gait with Horizontal Head Turns Performs head turns smoothly with slight change in gait velocity (eg, minor disruption to smooth gait path), deviates 6-10 in outside 12 in walkway width, or uses an assistive device.   Gait with Vertical Head Turns Performs task with slight change in gait velocity (eg, minor disruption to smooth gait path), deviates 6 - 10 in outside 12 in walkway width or uses assistive device   Gait and Pivot Turn Pivot turns safely in greater than 3 sec and stops with no loss of balance, or pivot turns safely within 3 sec and stops with mild imbalance, requires small steps to catch balance.   Step Over Obstacle Cannot perform without assistance.  used cane   Gait with Narrow Base of Support Ambulates less than 4 steps heel to toe or cannot perform without assistance.  using cane   Gait with Eyes Closed Walks 20 ft, uses assistive device, slower speed, mild gait deviations, deviates 6-10 in outside 12 in walkway width. Ambulates 20 ft in less than 9 sec but greater than 7 sec.   Ambulating Backwards Walks 20 ft, uses assistive device, slower speed, mild gait  deviations, deviates 6-10 in outside 12 in walkway width.   Steps Alternating feet, must use rail.   Total Score 16             PT Short Term Goals - 06/15/16 1502      PT SHORT TERM GOAL #1   Title Patient demonstrates understanding of initial HEP with wife's cues. (Target Date: 06/12/2016)   Baseline 06/15/16: pt/spouse independant with [employer] program with handouts, pt continues to be noncomplaint with performance   Status Partially Met     PT SHORT TERM GOAL #2   Title Patient ambulates with cane scanning environment maintaining path with no balance losses. (Target Date: 06/12/2016)   Baseline 06/15/16: met today   Status Achieved     PT SHORT  TERM GOAL #3   Title Patient reaches 5" anteriorly and to floor safely. (Target Date: 06/12/2016)   Baseline 06/15/16: only able to reach 2 inches forward safely, can pick up object from floor safely   Status Partially Met     PT [place] #4   Title Patient ambulates 200' conversing with simple cognitive task without balance loss with supervision. (Target Date: 06/12/2016)   Baseline 06/15/16: met today   Time --   Period --   Status Achieved           PT Long Term Goals - 06/15/16 1411      PT LONG TERM GOAL #1   Title Patient demonstrates understanding of ongoing fitness plan including Silver Sneakers at local fitness center & HEP with wife's cueing. (Target Date: 07/10/2016)   Baseline 06/15/16: pt/spouse have been provided with HEP and information on community programs near them. Pt not compliant with HEP and spouse not consistent with cueing him despite education to do so.    Time --   Period --   Status Achieved     PT LONG TERM GOAL #2   Title Activities of Balance Confidence survery improves >10% from 32.5% to indicate improved confidence. (Target Date: 07/10/2016)   Baseline 06/15/16: 47.5% scored today   Time --   Period --   Status Achieved     PT LONG TERM GOAL #3   Title Cognitive Timed Up & Go with cane with simple  naming task <15.5 sec. (Target Date: 07/10/2016)   Baseline 06/15/16: minimally improved from eval to 17.47 sec's with cane   Time --   Period --   Status Not Met     PT LONG TERM GOAL #4   Title Berg Balance Test >40/56 to indicate lower fall risk. (Target Date: 07/10/2016)   Baseline 06/15/16: 44/56 scored today   Time --   Period --   Status Achieved     PT LONG TERM GOAL #5   Title Functional Gait Assessment with cane >/= 12/30 to indicate lower fall risk. (Target Date: 07/10/2016)   Baseline 06/15/16: 16/30 scored today with cane   Time --   Period Weeks   Status Achieved               Plan - 06/15/16 1446    Clinical Impression Statement Today's session began by discussing pt's poor attendance and decreased complaince with HEP. Pt reports not wanting to continue with PT as he "just doesn't want to leave the house to come here, or anywhere". Spouse noncommental on topic and deferred all decisions to patient. Goals checked with pt meeting 2/4 STGs, partially meeting the other 2. Pt met 4/5 LTGs. Did not meet cognitive TUG LTG. Pt and spouse agreeable to discharge today. Primary PT made aware.       Rehab Potential Good   PT Frequency 2x / week   PT Duration 8 weeks   PT Treatment/Interventions ADLs/Self Care Home Management;DME Instruction;Gait training;Stair training;Functional mobility training;Therapeutic activities;Therapeutic exercise;Balance training;Neuromuscular re-education;Patient/family education;Orthotic Fit/Training;Manual techniques;Passive range of motion   PT Next Visit Plan discharge today   Consulted and Agree with Plan of Care Patient;Family member/caregiver   Family Member Consulted wife      Patient will benefit from skilled therapeutic intervention in order to improve the following deficits and impairments:  Abnormal gait, Decreased activity tolerance, Decreased balance, Decreased cognition, Decreased endurance, Decreased knowledge of precautions, Decreased  knowledge of use of DME, Decreased mobility, Impaired flexibility, Decreased  strength, Postural dysfunction  Visit Diagnosis: Other abnormalities of gait and mobility  Unsteadiness on feet  Muscle weakness (generalized)  History of falling       G-Codes - 07-04-16 1446    Functional Assessment Tool Used (Outpatient Only) Berg Balance Test [phone]   Functional Limitation Mobility: Walking and moving around      Problem List Patient Active Problem List   Diagnosis Date Noted  . Upper airway cough syndrome 01/31/2015  . Dyspnea 01/31/2015  . Special screening for malignant neoplasms, colon 06/01/2013  . Mild cognitive impairment with memory loss 06/01/2013  . Unsteady gait 05/08/2013  . Type 1 diabetes mellitus ([place]) 08/20/2009  . Hyperlipidemia 08/18/2008  . Essential hypertension 08/18/2008  . INTRAOCULAR LENS IMPLANT, HX OF 08/18/2008  . CATARACT EXTRACTION, RIGHT EYE, HX OF 08/18/2008  . Diabetes ([place]) 08/17/2008  . CORONARY ATHEROSCLEROSIS NATIVE CORONARY ARTERY 08/13/2008     Willow Ora, PTA, [facility] [address] [place], [place] [address] [phone] 07-04-16, 3:27 PM   Name: William Aguirre MRN: [id number] Date of Birth: 02-01-1935      G-Codes - 2016/07/04 1446    Functional Assessment Tool Used (Outpatient Only) Berg Balance Test [phone]   Functional Limitation Mobility: Walking and moving around   Mobility: Walking and Moving Around Goal Status [phone]) At least 40 percent but less than 60 percent impaired, limited or restricted   Mobility: Walking and Moving Around Discharge Status [phone]) At least 40 percent but less than 60 percent impaired, limited or restricted     PHYSICAL THERAPY DISCHARGE SUMMARY  Visits from Start of Care: 6  Current functional level related to goals / functional outcomes: See above   Remaining deficits: See above   Education / Equipment: HEP  Plan: Patient agrees to discharge.  Patient  goals were partially met. Patient is being discharged due to the patient's request.  ?????         Jamey Reas, PT, DPT PT Specializing in [place] 06/16/16 3:14 PM Phone:  [phone]  Fax:  [phone] [facility] [address] [place], [place] [address]

## 2016-06-17 ENCOUNTER — Ambulatory Visit: Payer: Commercial Managed Care - HMO | Admitting: Physical Therapy

## 2016-06-18 ENCOUNTER — Other Ambulatory Visit: Payer: Self-pay

## 2016-06-18 ENCOUNTER — Telehealth: Payer: Self-pay | Admitting: Endocrinology

## 2016-06-18 MED ORDER — ACCU-CHEK SOFTCLIX LANCETS MISC: 2 refills | Status: DC

## 2016-06-18 MED ORDER — VALSARTAN 80 MG PO TABS: 80.0000 mg | ORAL_TABLET | Freq: Every day | ORAL | 1 refills | Status: DC

## 2016-06-18 NOTE — Telephone Encounter (Signed)
Refill submitted. 

## 2016-06-18 NOTE — Telephone Encounter (Signed)
Refill of   ACCU-CHEK SOFTCLIX LANCETS lancets  [facility] [place] [address] [place] [phone] (Phone) [phone] (Fax)

## 2016-06-22 ENCOUNTER — Ambulatory Visit: Payer: Commercial Managed Care - HMO | Admitting: Physical Therapy

## 2016-06-24 ENCOUNTER — Ambulatory Visit: Payer: Commercial Managed Care - HMO | Admitting: Physical Therapy

## 2016-06-29 ENCOUNTER — Ambulatory Visit: Payer: Commercial Managed Care - HMO | Admitting: Physical Therapy

## 2016-07-01 ENCOUNTER — Ambulatory Visit: Payer: Commercial Managed Care - HMO | Admitting: Physical Therapy

## 2016-07-06 ENCOUNTER — Ambulatory Visit: Payer: Commercial Managed Care - HMO | Admitting: Physical Therapy

## 2016-07-06 DIAGNOSIS — Y92009 Unspecified place in unspecified non-institutional (private) residence as the place of occurrence of the external cause: Secondary | ICD-10-CM

## 2016-07-06 DIAGNOSIS — W19XXXA Unspecified fall, initial encounter: Secondary | ICD-10-CM

## 2016-07-06 HISTORY — DX: Unspecified place in unspecified non-institutional (private) residence as the place of occurrence of the external cause: Y92.009

## 2016-07-06 HISTORY — DX: Unspecified place in unspecified non-institutional (private) residence as the place of occurrence of the external cause: W19.XXXA

## 2016-07-08 ENCOUNTER — Emergency Department (HOSPITAL_COMMUNITY): Payer: Medicare HMO

## 2016-07-08 ENCOUNTER — Telehealth: Payer: Self-pay | Admitting: Adult Health

## 2016-07-08 ENCOUNTER — Ambulatory Visit: Payer: Commercial Managed Care - HMO | Admitting: Physical Therapy

## 2016-07-08 ENCOUNTER — Observation Stay (HOSPITAL_COMMUNITY)
Admission: EM | Admit: 2016-07-08 | Discharge: 2016-07-09 | Disposition: A | Discharge status: Home / Self Care | Payer: Medicare HMO | Attending: Family Medicine | Admitting: Family Medicine

## 2016-07-08 ENCOUNTER — Encounter (HOSPITAL_COMMUNITY): Payer: Self-pay

## 2016-07-08 ENCOUNTER — Observation Stay (HOSPITAL_COMMUNITY): Payer: Medicare HMO

## 2016-07-08 DIAGNOSIS — Z885 Allergy status to narcotic agent status: Secondary | ICD-10-CM | POA: Insufficient documentation

## 2016-07-08 DIAGNOSIS — I6789 Other cerebrovascular disease: Secondary | ICD-10-CM | POA: Diagnosis not present

## 2016-07-08 DIAGNOSIS — Z7982 Long term (current) use of aspirin: Secondary | ICD-10-CM | POA: Diagnosis not present

## 2016-07-08 DIAGNOSIS — S83231A Complex tear of medial meniscus, current injury, right knee, initial encounter: Secondary | ICD-10-CM | POA: Diagnosis not present

## 2016-07-08 DIAGNOSIS — M6281 Muscle weakness (generalized): Secondary | ICD-10-CM | POA: Diagnosis not present

## 2016-07-08 DIAGNOSIS — I10 Essential (primary) hypertension: Secondary | ICD-10-CM

## 2016-07-08 DIAGNOSIS — M94261 Chondromalacia, right knee: Secondary | ICD-10-CM | POA: Diagnosis not present

## 2016-07-08 DIAGNOSIS — Z23 Encounter for immunization: Secondary | ICD-10-CM | POA: Diagnosis not present

## 2016-07-08 DIAGNOSIS — S299XXA Unspecified injury of thorax, initial encounter: Secondary | ICD-10-CM | POA: Diagnosis not present

## 2016-07-08 DIAGNOSIS — Z7902 Long term (current) use of antithrombotics/antiplatelets: Secondary | ICD-10-CM | POA: Insufficient documentation

## 2016-07-08 DIAGNOSIS — E162 Hypoglycemia, unspecified: Secondary | ICD-10-CM | POA: Diagnosis not present

## 2016-07-08 DIAGNOSIS — Y92009 Unspecified place in unspecified non-institutional (private) residence as the place of occurrence of the external cause: Secondary | ICD-10-CM | POA: Diagnosis not present

## 2016-07-08 DIAGNOSIS — E785 Hyperlipidemia, unspecified: Secondary | ICD-10-CM | POA: Diagnosis present

## 2016-07-08 DIAGNOSIS — R4781 Slurred speech: Secondary | ICD-10-CM | POA: Diagnosis not present

## 2016-07-08 DIAGNOSIS — E10649 Type 1 diabetes mellitus with hypoglycemia without coma: Secondary | ICD-10-CM | POA: Insufficient documentation

## 2016-07-08 DIAGNOSIS — Z8249 Family history of ischemic heart disease and other diseases of the circulatory system: Secondary | ICD-10-CM | POA: Insufficient documentation

## 2016-07-08 DIAGNOSIS — F0390 Unspecified dementia without behavioral disturbance: Secondary | ICD-10-CM | POA: Insufficient documentation

## 2016-07-08 DIAGNOSIS — S3993XA Unspecified injury of pelvis, initial encounter: Secondary | ICD-10-CM | POA: Diagnosis not present

## 2016-07-08 DIAGNOSIS — R102 Pelvic and perineal pain: Secondary | ICD-10-CM | POA: Diagnosis not present

## 2016-07-08 DIAGNOSIS — R079 Chest pain, unspecified: Secondary | ICD-10-CM | POA: Diagnosis not present

## 2016-07-08 DIAGNOSIS — S8011XA Contusion of right lower leg, initial encounter: Secondary | ICD-10-CM | POA: Diagnosis not present

## 2016-07-08 DIAGNOSIS — E108 Type 1 diabetes mellitus with unspecified complications: Secondary | ICD-10-CM

## 2016-07-08 DIAGNOSIS — S199XXA Unspecified injury of neck, initial encounter: Secondary | ICD-10-CM | POA: Diagnosis not present

## 2016-07-08 DIAGNOSIS — Z87891 Personal history of nicotine dependence: Secondary | ICD-10-CM | POA: Diagnosis not present

## 2016-07-08 DIAGNOSIS — R531 Weakness: Secondary | ICD-10-CM | POA: Insufficient documentation

## 2016-07-08 DIAGNOSIS — R410 Disorientation, unspecified: Secondary | ICD-10-CM | POA: Diagnosis not present

## 2016-07-08 DIAGNOSIS — Z79899 Other long term (current) drug therapy: Secondary | ICD-10-CM | POA: Diagnosis not present

## 2016-07-08 DIAGNOSIS — I2510 Atherosclerotic heart disease of native coronary artery without angina pectoris: Secondary | ICD-10-CM | POA: Diagnosis not present

## 2016-07-08 DIAGNOSIS — W010XXA Fall on same level from slipping, tripping and stumbling without subsequent striking against object, initial encounter: Secondary | ICD-10-CM | POA: Insufficient documentation

## 2016-07-08 DIAGNOSIS — G3184 Mild cognitive impairment, so stated: Secondary | ICD-10-CM | POA: Diagnosis not present

## 2016-07-08 DIAGNOSIS — I6529 Occlusion and stenosis of unspecified carotid artery: Secondary | ICD-10-CM | POA: Diagnosis not present

## 2016-07-08 DIAGNOSIS — Z794 Long term (current) use of insulin: Secondary | ICD-10-CM | POA: Insufficient documentation

## 2016-07-08 DIAGNOSIS — M25561 Pain in right knee: Secondary | ICD-10-CM | POA: Insufficient documentation

## 2016-07-08 DIAGNOSIS — E119 Type 2 diabetes mellitus without complications: Secondary | ICD-10-CM

## 2016-07-08 DIAGNOSIS — Z8673 Personal history of transient ischemic attack (TIA), and cerebral infarction without residual deficits: Secondary | ICD-10-CM | POA: Diagnosis not present

## 2016-07-08 DIAGNOSIS — Z809 Family history of malignant neoplasm, unspecified: Secondary | ICD-10-CM | POA: Insufficient documentation

## 2016-07-08 DIAGNOSIS — M1711 Unilateral primary osteoarthritis, right knee: Secondary | ICD-10-CM | POA: Diagnosis not present

## 2016-07-08 DIAGNOSIS — Y939 Activity, unspecified: Secondary | ICD-10-CM | POA: Diagnosis not present

## 2016-07-08 DIAGNOSIS — W19XXXA Unspecified fall, initial encounter: Secondary | ICD-10-CM

## 2016-07-08 DIAGNOSIS — Q760 Spina bifida occulta: Secondary | ICD-10-CM | POA: Insufficient documentation

## 2016-07-08 DIAGNOSIS — E7800 Pure hypercholesterolemia, unspecified: Secondary | ICD-10-CM

## 2016-07-08 DIAGNOSIS — R2689 Other abnormalities of gait and mobility: Secondary | ICD-10-CM | POA: Diagnosis present

## 2016-07-08 DIAGNOSIS — S0990XA Unspecified injury of head, initial encounter: Secondary | ICD-10-CM | POA: Diagnosis not present

## 2016-07-08 HISTORY — DX: Unspecified fall, initial encounter: W19.XXXA

## 2016-07-08 HISTORY — DX: Unspecified place in unspecified non-institutional (private) residence as the place of occurrence of the external cause: Y92.009

## 2016-07-08 LAB — URINALYSIS, ROUTINE W REFLEX MICROSCOPIC
Bilirubin Urine: NEGATIVE
GLUCOSE, UA: NEGATIVE mg/dL
HGB URINE DIPSTICK: NEGATIVE
Ketones, ur: NEGATIVE mg/dL
Leukocytes, UA: NEGATIVE
Nitrite: NEGATIVE
PROTEIN: NEGATIVE mg/dL
Specific Gravity, Urine: 1.016 (ref 1.005–1.030)
pH: 5 (ref 5.0–8.0)

## 2016-07-08 LAB — CBC WITH DIFFERENTIAL/PLATELET
BASOS ABS: 0 10*3/uL (ref 0.0–0.1)
BASOS PCT: 0 %
Eosinophils Absolute: 0 10*3/uL (ref 0.0–0.7)
Eosinophils Relative: 0 %
HEMATOCRIT: 43.3 % (ref 39.0–52.0)
HEMOGLOBIN: 14.5 g/dL (ref 13.0–17.0)
LYMPHS PCT: 18 %
Lymphs Abs: 2.1 10*3/uL (ref 0.7–4.0)
MCH: 27.8 pg (ref 26.0–34.0)
MCHC: 33.5 g/dL (ref 30.0–36.0)
MCV: 83.1 fL (ref 78.0–100.0)
MONO ABS: 1.2 10*3/uL — AB (ref 0.1–1.0)
Monocytes Relative: 10 %
NEUTROS ABS: 8.6 10*3/uL — AB (ref 1.7–7.7)
NEUTROS PCT: 72 %
Platelets: 198 10*3/uL (ref 150–400)
RBC: 5.21 MIL/uL (ref 4.22–5.81)
RDW: 14.6 % (ref 11.5–15.5)
WBC: 12 10*3/uL — AB (ref 4.0–10.5)

## 2016-07-08 LAB — COMPREHENSIVE METABOLIC PANEL
ALK PHOS: 99 U/L (ref 38–126)
ALT: 25 U/L (ref 17–63)
AST: 27 U/L (ref 15–41)
Albumin: 3.9 g/dL (ref 3.5–5.0)
Anion gap: 9 (ref 5–15)
BILIRUBIN TOTAL: 0.6 mg/dL (ref 0.3–1.2)
BUN: 14 mg/dL (ref 6–20)
CO2: 25 mmol/L (ref 22–32)
CREATININE: 1.14 mg/dL (ref 0.61–1.24)
Calcium: 9.5 mg/dL (ref 8.9–10.3)
Chloride: 105 mmol/L (ref 101–111)
GFR calc Af Amer: >60 mL/min (ref >60)
GFR calc non Af Amer: 59 mL/min — ABNORMAL LOW (ref >60)
GLUCOSE: 63 mg/dL — AB (ref 65–99)
POTASSIUM: 4.3 mmol/L (ref 3.5–5.1)
Sodium: 139 mmol/L (ref 135–145)
TOTAL PROTEIN: 7.8 g/dL (ref 6.5–8.1)

## 2016-07-08 LAB — I-STAT TROPONIN, ED: Troponin i, poc: 0 ng/mL (ref 0.00–0.08)

## 2016-07-08 LAB — CBG MONITORING, ED: Glucose-Capillary: 121 mg/dL — ABNORMAL HIGH (ref 65–99)

## 2016-07-08 LAB — CK: CK TOTAL: 346 U/L (ref 49–397)

## 2016-07-08 MED ORDER — ENOXAPARIN SODIUM 40 MG/0.4ML ~~LOC~~ SOLN
40.0000 mg | SUBCUTANEOUS | Status: DC
  Administered 2016-07-08: 40 mg via SUBCUTANEOUS
  Filled 2016-07-08 (×2): qty 0.4

## 2016-07-08 MED ORDER — TRAMADOL HCL 50 MG PO TABS
50.0000 mg | ORAL_TABLET | Freq: Four times a day (QID) | ORAL | Status: DC | PRN
  Filled 2016-07-08: qty 1

## 2016-07-08 MED ORDER — ASPIRIN EC 81 MG PO TBEC
81.0000 mg | DELAYED_RELEASE_TABLET | Freq: Every day | ORAL | Status: DC
  Administered 2016-07-09: 81 mg via ORAL
  Filled 2016-07-08: qty 1

## 2016-07-08 MED ORDER — SODIUM CHLORIDE 0.9 % IV SOLN
INTRAVENOUS | Status: DC
Start: 2016-07-08 — End: 2016-07-09
  Administered 2016-07-08: 75 mL/h via INTRAVENOUS

## 2016-07-08 MED ORDER — ACETAMINOPHEN 325 MG PO TABS
650.0000 mg | ORAL_TABLET | Freq: Four times a day (QID) | ORAL | Status: DC | PRN
  Administered 2016-07-08: 650 mg via ORAL
  Filled 2016-07-08: qty 2

## 2016-07-08 MED ORDER — INSULIN ASPART 100 UNIT/ML ~~LOC~~ SOLN
6.0000 [IU] | Freq: Three times a day (TID) | SUBCUTANEOUS | Status: DC
  Administered 2016-07-09: 6 [IU] via SUBCUTANEOUS

## 2016-07-08 MED ORDER — SODIUM CHLORIDE 0.9 % IV BOLUS (SEPSIS)
1000.0000 mL | Freq: Once | INTRAVENOUS | Status: AC
  Administered 2016-07-08: 1000 mL via INTRAVENOUS

## 2016-07-08 MED ORDER — IRBESARTAN 150 MG PO TABS
75.0000 mg | ORAL_TABLET | Freq: Every day | ORAL | Status: DC
  Administered 2016-07-09: 75 mg via ORAL
  Filled 2016-07-08: qty 1

## 2016-07-08 MED ORDER — INSULIN GLARGINE 100 UNIT/ML ~~LOC~~ SOLN
30.0000 [IU] | Freq: Every day | SUBCUTANEOUS | Status: DC
Start: 2016-07-08 — End: 2016-07-09
  Administered 2016-07-08: 30 [IU] via SUBCUTANEOUS
  Filled 2016-07-08: qty 0.3

## 2016-07-08 MED ORDER — BISACODYL 10 MG RE SUPP: 10.0000 mg | Freq: Every day | RECTAL | Status: DC | PRN

## 2016-07-08 MED ORDER — ATORVASTATIN CALCIUM 80 MG PO TABS
80.0000 mg | ORAL_TABLET | Freq: Every day | ORAL | Status: DC
  Administered 2016-07-09: 80 mg via ORAL
  Filled 2016-07-08: qty 1

## 2016-07-08 MED ORDER — POLYETHYLENE GLYCOL 3350 17 G PO PACK: 17.0000 g | PACK | Freq: Every day | ORAL | Status: DC | PRN

## 2016-07-08 MED ORDER — ONDANSETRON HCL 4 MG/2ML IJ SOLN: 4.0000 mg | Freq: Four times a day (QID) | INTRAMUSCULAR | Status: DC | PRN

## 2016-07-08 MED ORDER — OMEPRAZOLE MAGNESIUM 20 MG PO TBEC: 20.0000 mg | DELAYED_RELEASE_TABLET | Freq: Every day | ORAL | Status: DC

## 2016-07-08 MED ORDER — INSULIN GLARGINE 100 UNIT/ML SOLOSTAR PEN: 30.0000 [IU] | PEN_INJECTOR | Freq: Every day | SUBCUTANEOUS | Status: DC

## 2016-07-08 MED ORDER — INSULIN ASPART 100 UNIT/ML ~~LOC~~ SOLN
0.0000 [IU] | Freq: Three times a day (TID) | SUBCUTANEOUS | Status: DC
  Administered 2016-07-09: 3 [IU] via SUBCUTANEOUS

## 2016-07-08 MED ORDER — ONDANSETRON HCL 4 MG PO TABS: 4.0000 mg | ORAL_TABLET | Freq: Four times a day (QID) | ORAL | Status: DC | PRN

## 2016-07-08 MED ORDER — PANTOPRAZOLE SODIUM 20 MG PO TBEC
20.0000 mg | DELAYED_RELEASE_TABLET | Freq: Every day | ORAL | Status: DC
  Administered 2016-07-08 – 2016-07-09 (×2): 20 mg via ORAL
  Filled 2016-07-08 (×2): qty 1

## 2016-07-08 MED ORDER — BISOPROLOL FUMARATE 5 MG PO TABS
5.0000 mg | ORAL_TABLET | Freq: Two times a day (BID) | ORAL | Status: DC
  Administered 2016-07-08 – 2016-07-09 (×2): 5 mg via ORAL
  Filled 2016-07-08 (×3): qty 1

## 2016-07-08 MED ORDER — DEXTROSE 50 % IV SOLN
25.0000 mL | Freq: Once | INTRAVENOUS | Status: AC
  Administered 2016-07-08: 25 mL via INTRAVENOUS
  Filled 2016-07-08: qty 50

## 2016-07-08 MED ORDER — CLOPIDOGREL BISULFATE 75 MG PO TABS
75.0000 mg | ORAL_TABLET | Freq: Every day | ORAL | Status: DC
  Administered 2016-07-09: 75 mg via ORAL
  Filled 2016-07-08: qty 1

## 2016-07-08 MED ORDER — KETOROLAC TROMETHAMINE 15 MG/ML IJ SOLN
15.0000 mg | Freq: Four times a day (QID) | INTRAMUSCULAR | Status: DC | PRN
  Administered 2016-07-09: 15 mg via INTRAVENOUS
  Filled 2016-07-08: qty 1

## 2016-07-08 MED ORDER — ACETAMINOPHEN 650 MG RE SUPP: 650.0000 mg | Freq: Four times a day (QID) | RECTAL | Status: DC | PRN

## 2016-07-08 NOTE — ED Notes (Signed)
MD Zammit and this RN attempted to stand patient and ambulate, patient unable to bear weight on right leg due to pain.

## 2016-07-08 NOTE — ED Notes (Signed)
MD Zammit at the bedside.  

## 2016-07-08 NOTE — ED Notes (Signed)
Pt taken to MRI  

## 2016-07-08 NOTE — ED Notes (Signed)
Pt eating lunch per MD approval.

## 2016-07-08 NOTE — H&P (Signed)
History and Physical  William EvertsHenry S Aguirre ZHY:[id number] DOB: 1934-10-14 DOA: 07/08/2016  Referring physician: Estell HarpinZammit PCP: Shirline Freesory Nafziger, NP  Outpatient Specialists:  1. Endocrinology - Everardo AllEllison  Chief Complaint: AMS  HPI: William Aguirre is a 82 y.o. male with insulin requiring diabetes mellitus presented to the emergency department after wife came home and found him on the floor. She had been working nights. The patient reports that he tripped on some furniture and fell on the floor. The patient is complaining of severe right knee pain. He also complains of weakness. He was noted to be hypoglycemic with a blood glucose of 63 on arrival. He said that his Lantus was recently increased in the last 2 months from 12 units to 75 units. He was taken off short acting insulin at the time.  He was also noted to have some initial slurred speech and confusion.  He does have known mild dementia. That has completely resolved at this time. He does have a history of TIA. He was evaluated by neurology in the emergency department and they felt that he was not having acute neurological symptoms. Unfortunately the patient has not been able to bear weight and stand since the fall. He is being admitted for observation and further evaluation of the right knee pain. Because we don't know how long he was down on the floor he is being evaluated for rhabdomyolysis.  Review of Systems: All systems reviewed and apart from history of presenting illness, are negative.  Past Medical History:  Diagnosis Date  . Coronary artery disease    Moderate LAD, Diagonal, Circumflex disease by cath 2004  . Dementia   . Diabetes mellitus without complication (HCC)   . Hyperlipidemia   . Hypertension    Past Surgical History:  Procedure Laterality Date  . CATARACT EXTRACTION, BILATERAL     Social History:  reports that he quit smoking about 51 years ago. He quit after 3.00 years of use. He has never used smokeless tobacco. He reports  that he does not drink alcohol or use drugs.   Allergies  Allergen Reactions  . Hydrocodone-Acetaminophen Other (See Comments)     Delusions    Family History  Problem Relation Age of Onset  . Other Father     Deceased  . Other Mother     Deceased, 7279  . Other Sister     Deceased  . Cancer Sister     Deceased  . Healthy Son     Living  . Heart disease Son     Deceased, 30 from heart valve dysfunction  . Healthy Daughter     Prior to Admission medications   Medication Sig Start Date End Date Taking? Authorizing Provider  ACCU-CHEK FASTCLIX LANCETS MISC Use to check blood sugar 2 times per day. 03/09/16  Yes Romero BellingSean Ellison, MD  ACCU-CHEK SOFTCLIX LANCETS lancets USE TO TEST BLOOD SUGAR TWICE DAILY 06/18/16  Yes Romero BellingSean Ellison, MD  atorvastatin (LIPITOR) 80 MG tablet TAKE 1 TABLET BY MOUTH ONCE DAILY 05/22/16  Yes Kathleene Hazelhristopher D McAlhany, MD  bisoprolol (ZEBETA) 5 MG tablet Take 1 tablet (5 mg total) by mouth 2 (two) times daily. 06/04/16  Yes Shirline Freesory Nafziger, NP  clopidogrel (PLAVIX) 75 MG tablet TAKE 1 TABLET BY MOUTH ONCE DAILY WITH BREAKFAST 01/02/16  Yes Kathleene Hazelhristopher D McAlhany, MD  glucose blood (ACCU-CHEK AVIVA PLUS) test strip Use to check blood sugar 2 times per day. Dx code: E11.9 06/15/16  Yes Romero BellingSean Ellison, MD  Insulin Glargine (  LANTUS SOLOSTAR) 100 UNIT/ML Solostar Pen Inject 75 Units into the skin every morning. And pen needles 1/day 03/11/16  Yes Romero Belling, MD  Melatonin 3 MG TABS Take 3 mg by mouth at bedtime. 02/11/16  Yes Historical Provider, MD  omeprazole (PRILOSEC OTC) 20 MG tablet Take 20 mg by mouth daily.   Yes Historical Provider, MD  SM ASPIRIN ADULT LOW STRENGTH 81 MG EC tablet TAKE 1 TABLET (81MG ) BY MOUTH ONCE DAILY 04/24/16  Yes Kathleene Hazel, MD  traMADol (ULTRAM) 50 MG tablet Take 1 tablet (50 mg total) by mouth 2 (two) times daily as needed for severe pain. 09/17/14  Yes Eulis Foster, FNP  valsartan (DIOVAN) 80 MG tablet Take 1 tablet (80 mg total) by  mouth daily. 06/18/16  Yes Shirline Frees, NP   Physical Exam: Vitals:   07/08/16 1433 07/08/16 1445 07/08/16 1515 07/08/16 1530  BP: (!) 157/81  (!) 142/71 132/85  Pulse: 69 80 77 66  Resp: 15 18 16 17   Temp:      TempSrc:      SpO2: 100% 100% 98% 98%  Weight:      Height:         General exam: Moderately built and nourished patient, lying comfortably supine on the gurney in no obvious distress.  Head, eyes and ENT: Nontraumatic and normocephalic. Pupils equally reacting to light and accommodation. Oral mucosa dry.  Neck: Supple. No JVD, carotid bruit or thyromegaly.  Lymphatics: No lymphadenopathy.  Respiratory system: Clear to auscultation. No increased work of breathing.  Cardiovascular system: S1 and S2 heard, RRR. No JVD, murmurs, gallops, clicks or pedal edema.  Gastrointestinal system: Abdomen is nondistended, soft and nontender. Normal bowel sounds heard. No organomegaly or masses appreciated.  Central nervous system: Alert and oriented. No focal neurological deficits.  Extremities: Symmetric 5 x 5 power. Peripheral pulses symmetrically felt.  Right knee swelling and crepitus noted.   Skin: No rashes or acute findings.  Psychiatry: Pleasant and cooperative.   Labs on Admission:  Basic Metabolic Panel:  Recent Labs Lab 07/08/16 1100  NA 139  K 4.3  CL 105  CO2 25  GLUCOSE 63*  BUN 14  CREATININE 1.14  CALCIUM 9.5   Liver Function Tests:  Recent Labs Lab 07/08/16 1100  AST 27  ALT 25  ALKPHOS 99  BILITOT 0.6  PROT 7.8  ALBUMIN 3.9   No results for input(s): LIPASE, AMYLASE in the last 168 hours. No results for input(s): AMMONIA in the last 168 hours. CBC:  Recent Labs Lab 07/08/16 1100  WBC 12.0*  NEUTROABS 8.6*  HGB 14.5  HCT 43.3  MCV 83.1  PLT 198   Cardiac Enzymes: No results for input(s): CKTOTAL, CKMB, CKMBINDEX, TROPONINI in the last 168 hours.  BNP (last 3 results) No results for input(s): PROBNP in the last 8760  hours. CBG:  Recent Labs Lab 07/08/16 1433  GLUCAP 121*    Radiological Exams on Admission: Dg Chest 2 View  Result Date: 07/08/2016 CLINICAL DATA:  Larey Seat yesterday with pain EXAM: CHEST  2 VIEW COMPARISON:  Chest x-ray of 01/04/2015 FINDINGS: There is mild bibasilar linear atelectasis present. No pneumonia or effusion is seen. Mediastinal and hilar contours are unremarkable. The heart is borderline enlarged. No bony abnormality is seen. IMPRESSION: Mild bibasilar atelectasis.  No active process. Electronically Signed   By: Dwyane Dee M.D.   On: 07/08/2016 11:51   Dg Pelvis 1-2 Views  Result Date: 07/08/2016 CLINICAL DATA:  Larey Seat  yesterday with pain EXAM: PELVIS - 1-2 VIEW COMPARISON:  Lumbar spine films of 04/24/2013 FINDINGS: The femoral heads are in normal position and the hip joint spaces are relatively normal for age. The pelvic rami are intact. The SI joints are corticated. There are degenerative changes in the lower lumbar spine. IMPRESSION: No acute fracture.  Degenerative change of the lower lumbar spine. Electronically Signed   By: Dwyane Dee M.D.   On: 07/08/2016 11:54   Ct Head Wo Contrast  Result Date: 07/08/2016 CLINICAL DATA:  82 year old male found down at home this morning. Initial encounter. EXAM: CT HEAD WITHOUT CONTRAST CT CERVICAL SPINE WITHOUT CONTRAST TECHNIQUE: Multidetector CT imaging of the head and cervical spine was performed following the standard protocol without intravenous contrast. Multiplanar CT image reconstructions of the cervical spine were also generated. COMPARISON:  Brain MRI 06/12/2015. Head and cervical spine CT 05/28/2013 FINDINGS: CT HEAD FINDINGS Brain: Stable gray-white matter differentiation throughout the brain. No midline shift, ventriculomegaly, mass effect, evidence of mass lesion, intracranial hemorrhage or evidence of cortically based acute infarction. No cortical encephalomalacia identified. Vascular: Calcified atherosclerosis at the skull  base. Skull: No acute osseous abnormality identified. Sinuses/Orbits: Visualized paranasal sinuses and mastoids are stable and well pneumatized. Other: No scalp hematoma identified. Stable orbits soft tissues aside from interval postoperative changes to the left globe. CT CERVICAL SPINE FINDINGS Alignment: Chronic straightening of cervical lordosis with mild anterolisthesis of C2 on C3. Cervicothoracic junction alignment is within normal limits. Bilateral posterior element alignment appears stable. Skull base and vertebrae: Visualized skull base is intact. No atlanto-occipital dissociation. 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 Spina bifida occulta at C7. No acute cervical spine fracture identified. Soft tissues and spinal canal: No prevertebral fluid or swelling. No visible canal hematoma. Retropharyngeal course of both carotid arteries. Calcified carotid atherosclerosis Disc levels: Severe diffuse cervical disc space loss and bulky endplate spurring, relatively sparing the C2-C3 level. Chronic degenerative cervical spinal stenosis appears most pronounced at C3-C4, moderate. Upper chest: Visible upper thoracic levels appear intact. Negative lung apices. IMPRESSION: 1. No acute intracranial abnormality. Stable non contrast CT appearance of the brain. 2. Chronic severe cervical spine degeneration. No acute fracture or listhesis in the cervical spine. Electronically Signed   By: Odessa Fleming M.D.   On: 07/08/2016 13:03   Ct Cervical Spine Wo Contrast  Result Date: 07/08/2016 CLINICAL DATA:  82 year old male found down at home this morning. Initial encounter. EXAM: CT HEAD WITHOUT CONTRAST CT CERVICAL SPINE WITHOUT CONTRAST TECHNIQUE: Multidetector CT imaging of the head and cervical spine was performed following the standard protocol without intravenous contrast. Multiplanar CT image reconstructions of the cervical spine were also generated. COMPARISON:  Brain MRI 06/12/2015. Head and cervical spine CT  05/28/2013 FINDINGS: CT HEAD FINDINGS Brain: Stable gray-white matter differentiation throughout the brain. No midline shift, ventriculomegaly, mass effect, evidence of mass lesion, intracranial hemorrhage or evidence of cortically based acute infarction. No cortical encephalomalacia identified. Vascular: Calcified atherosclerosis at the skull base. Skull: No acute osseous abnormality identified. Sinuses/Orbits: Visualized paranasal sinuses and mastoids are stable and well pneumatized. Other: No scalp hematoma identified. Stable orbits soft tissues aside from interval postoperative changes to the left globe. CT CERVICAL SPINE FINDINGS Alignment: Chronic straightening of cervical lordosis with mild anterolisthesis of C2 on C3. Cervicothoracic junction alignment is within normal limits. Bilateral posterior element alignment appears stable. Skull base and vertebrae: Visualized skull base is intact. No atlanto-occipital dissociation. 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 Spina  bifida occulta at C7. No acute cervical spine fracture identified. Soft tissues and spinal canal: No prevertebral fluid or swelling. No visible canal hematoma. Retropharyngeal course of both carotid arteries. Calcified carotid atherosclerosis Disc levels: Severe diffuse cervical disc space loss and bulky endplate spurring, relatively sparing the C2-C3 level. Chronic degenerative cervical spinal stenosis appears most pronounced at C3-C4, moderate. Upper chest: Visible upper thoracic levels appear intact. Negative lung apices. IMPRESSION: 1. No acute intracranial abnormality. Stable non contrast CT appearance of the brain. 2. Chronic severe cervical spine degeneration. No acute fracture or listhesis in the cervical spine. Electronically Signed   By: Odessa Fleming M.D.   On: 07/08/2016 13:03   Dg Knee Complete 4 Views Right  Result Date: 07/08/2016 CLINICAL DATA:  Larey Seat yesterday with right knee pain EXAM: RIGHT KNEE - COMPLETE 4+ VIEW  COMPARISON:  Right knee films of 01/09/2013 FINDINGS: There has been slight interval progression of tricompartmental degenerative joint disease the right knee. Slight loss of medial compartment and patellofemoral compartment joint spaces is noted with sclerosis and spurring. No fracture is seen. There may be a small amount of joint fluid present. As noted previously there is faint chondrocalcinosis present which could indicate CPPD arthropathy. Arterial calcifications are present. IMPRESSION: 1. Interval progression of tricompartmental degenerative joint disease of the right knee primarily involving the medial and patellofemoral compartments. 2. Faint chondrocalcinosis may indicate CPPD arthropathy. Electronically Signed   By: Dwyane Dee M.D.   On: 07/08/2016 11:53    EKG: Independently reviewed. Normal sinus rhythm  Assessment/Plan Principal Problem:   Right knee pain Active Problems:   Diabetes (HCC)   Type 1 diabetes mellitus (HCC)   Hyperlipidemia   Essential hypertension   CORONARY ATHEROSCLEROSIS NATIVE CORONARY ARTERY   Mild cognitive impairment with memory loss   Unable to bear weight   Hypoglycemia  1. Fall at home with subsequent right knee pain-unable to bear weight secondary to pain, admitted for observation, get MRI of the right knee to rule out cartilage tear. Consult PT for evaluation and assistance with ambulation. Further evaluation pending MRI results. Consider orthopedic consult if necessary. Tramadol ordered for pain in addition to Tylenol. The patient does not tolerate hydrocodone by history. 2. Fall-check CK levels and gently hydrate. 3. Hypoglycemia secondary to insulin-suspect his basal insulin dose is too high. Based on weight will reduce lantus from 75 unit to 30 units, provide prandial and sliding coverage while in hospital. Patient is to follow-up with his endocrinologist outpatient. Monitor blood glucose closely in the hospital. Check hemoglobin A1c.  Observe  hypoglycemia precautions.  4. Cognitive impairment with memory loss-reported as mild, wife reports that she has been managing patient fairly well at home. 5. Essential hypertension-resume home medications. 6. Coronary artery disease-resume aspirin daily.   DVT Prophylaxis: lovenox  Code Status: full  Family Communication: wife  Disposition Plan: home    Time spent: 55 mins  Standley Dakins, MD [facility] Pager [phone]  If 7PM-7AM, please contact night-coverage [email or web address] Password [facility] 07/08/2016, 3:57 PM

## 2016-07-08 NOTE — ED Notes (Signed)
Attempted Report x1.   

## 2016-07-08 NOTE — Consult Note (Signed)
NEURO HOSPITALIST CONSULT NOTE   Requestig physician: Dr. Estell Harpin   Reason for Consult: right leg pain and possible weakness   History obtained from:  Patient     HPI:                                                                                                                                          William Aguirre is an 82 y.o. male presenting to the emergency department secondary to right leg pain and weakness. Patient states that he was getting into the passenger side of his car with his left leg however when he got into the car he is trying to avoid stepping on his Bible and then the door gave way. At that time he planted his right leg and twisted his right leg causing pain and then he fell on his back. He denies any pain that went down his leg or any discomfort in his back. When he got to church she stepped down onto his right leg and felt significant pain in his knee. He is continued to feel pain in his knee and thus he came to the emergency department. Neurology was consult good for right leg weakness and pain. In discussing with the patient I gently palpated the medial aspect of his knee and patient quickly stated that is where the pain is. Again he denies any other weakness, numbness, tingling, he denies any facial weakness visual problems or vertigo.  Past Medical History:  Diagnosis Date  . Coronary artery disease    Moderate LAD, Diagonal, Circumflex disease by cath 2004  . Dementia   . Diabetes mellitus without complication (HCC)   . Hyperlipidemia   . Hypertension     Past Surgical History:  Procedure Laterality Date  . CATARACT EXTRACTION, BILATERAL      Family History  Problem Relation Age of Onset  . Other Father     Deceased  . Other Mother     Deceased, 55  . Other Sister     Deceased  . Cancer Sister     Deceased  . Healthy Son     Living  . Heart disease Son     Deceased, 30 from heart valve dysfunction  . Healthy Daughter       Social History:  reports that he quit smoking about 51 years ago. He quit after 3.00 years of use. He has never used smokeless tobacco. He reports that he does not drink alcohol or use drugs.  Allergies  Allergen Reactions  . Hydrocodone-Acetaminophen Other (See Comments)     Delusions    MEDICATIONS:  No current facility-administered medications for this encounter.    Current Outpatient Prescriptions  Medication Sig Dispense Refill  . ACCU-CHEK FASTCLIX LANCETS MISC Use to check blood sugar 2 times per day. 100 each 2  . ACCU-CHEK SOFTCLIX LANCETS lancets USE TO TEST BLOOD SUGAR TWICE DAILY 100 each 2  . atorvastatin (LIPITOR) 80 MG tablet TAKE 1 TABLET BY MOUTH ONCE DAILY 30 tablet 8  . bisoprolol (ZEBETA) 5 MG tablet Take 1 tablet (5 mg total) by mouth 2 (two) times daily. 60 tablet 3  . clopidogrel (PLAVIX) 75 MG tablet TAKE 1 TABLET BY MOUTH ONCE DAILY WITH BREAKFAST 90 tablet 3  . glucose blood (ACCU-CHEK AVIVA PLUS) test strip Use to check blood sugar 2 times per day. Dx code: E11.9 100 each 12  . Insulin Glargine (LANTUS SOLOSTAR) 100 UNIT/ML Solostar Pen Inject 75 Units into the skin every morning. And pen needles 1/day 30 mL 11  . Melatonin 3 MG TABS Take 3 mg by mouth at bedtime.    Marland Kitchen omeprazole (PRILOSEC OTC) 20 MG tablet Take 20 mg by mouth daily.    Marland Kitchen SM ASPIRIN ADULT LOW STRENGTH 81 MG EC tablet TAKE 1 TABLET (81MG ) BY MOUTH ONCE DAILY 90 tablet 2  . traMADol (ULTRAM) 50 MG tablet Take 1 tablet (50 mg total) by mouth 2 (two) times daily as needed for severe pain. 180 tablet 0  . valsartan (DIOVAN) 80 MG tablet Take 1 tablet (80 mg total) by mouth daily. 90 tablet 1      ROS:                                                                                                                                       History obtained from the patient  General  ROS: negative for - chills, fatigue, fever, night sweats, weight gain or weight loss Psychological ROS: negative for - behavioral disorder, hallucinations, memory difficulties, mood swings or suicidal ideation Ophthalmic ROS: negative for - blurry vision, double vision, eye pain or loss of vision ENT ROS: negative for - epistaxis, nasal discharge, oral lesions, sore throat, tinnitus or vertigo Allergy and Immunology ROS: negative for - hives or itchy/watery eyes Hematological and Lymphatic ROS: negative for - bleeding problems, bruising or swollen lymph nodes Endocrine ROS: negative for - galactorrhea, hair pattern changes, polydipsia/polyuria or temperature intolerance Respiratory ROS: negative for - cough, hemoptysis, shortness of breath or wheezing Cardiovascular ROS: negative for - chest pain, dyspnea on exertion, edema or irregular heartbeat Gastrointestinal ROS: negative for - abdominal pain, diarrhea, hematemesis, nausea/vomiting or stool incontinence Genito-Urinary ROS: negative for - dysuria, hematuria, incontinence or urinary frequency/urgency Musculoskeletal ROS: Positive for -right joint pain Neurological ROS: as noted in HPI Dermatological ROS: negative for rash and skin lesion changes   Blood pressure (!) 157/81, pulse 69, temperature 98.5 F (36.9 C), temperature source Oral, resp. rate 15, height 5' 8.5" (1.74 m), weight  93 kg (205 lb), SpO2 100 %.   Neurologic Examination:                                                                                                      HEENT-  Normocephalic, no lesions, without obvious abnormality.  Normal external eye and conjunctiva.  Normal TM's bilaterally.  Normal auditory canals and external ears. Normal external nose, mucus membranes and septum.  Normal pharynx. Cardiovascular- S1, S2 normal, pulses palpable throughout   Lungs- chest clear, no wheezing, rales, normal symmetric air entry Abdomen- normal findings: bowel sounds  normal Extremities- no edema Lymph-no adenopathy palpable Musculoskeletal-no joint tenderness, deformity or swelling Skin-warm and dry, no hyperpigmentation, vitiligo, or suspicious lesions  Neurological Examination Mental Status: Alert, oriented, thought content appropriate.  Speech fluent without evidence of aphasia.  Able to follow 3 step commands without difficulty. Cranial Nerves: II: Discs flat bilaterally; Visual fields grossly normal, pupils equal, round, reactive to light and accommodation III,IV, VI: ptosis not present, extra-ocular motions intact bilaterally V,VII: smile symmetric, facial light touch sensation normal bilaterally VIII: hearing normal bilaterally IX,X: uvula rises symmetrically XI: bilateral shoulder shrug XII: midline tongue extension Motor: Right : Upper extremity   5/5    Left:     Upper extremity   5/5  Lower extremity   5/5     Lower extremity   5/5 --Patient has a positive McMurray's test, positive medial joint pain to palpation on the right knee, he has no varus or valgus laxity city to his knee. Patient has no anterior or posterior draw. Patient has no pain when leg is lifted and extended with the foot dorsiflexed. Tone and bulk:normal tone throughout; no atrophy noted Sensory: Pinprick and light touch intact throughout, bilaterally Deep Tendon Reflexes: 2+ and symmetric throughout Plantars: Right: downgoing   Left: downgoing Cerebellar: normal finger-to-nose, normal rapid alternating movements and normal heel-to-shin test Gait: Not tested      Lab Results: Basic Metabolic Panel:  Recent Labs Lab 07/08/16 1100  NA 139  K 4.3  CL 105  CO2 25  GLUCOSE 63*  BUN 14  CREATININE 1.14  CALCIUM 9.5    Liver Function Tests:  Recent Labs Lab 07/08/16 1100  AST 27  ALT 25  ALKPHOS 99  BILITOT 0.6  PROT 7.8  ALBUMIN 3.9   No results for input(s): LIPASE, AMYLASE in the last 168 hours. No results for input(s): AMMONIA in the last 168  hours.  CBC:  Recent Labs Lab 07/08/16 1100  WBC 12.0*  NEUTROABS 8.6*  HGB 14.5  HCT 43.3  MCV 83.1  PLT 198    Cardiac Enzymes: No results for input(s): CKTOTAL, CKMB, CKMBINDEX, TROPONINI in the last 168 hours.  Lipid Panel: No results for input(s): CHOL, TRIG, HDL, CHOLHDL, VLDL, LDLCALC in the last 168 hours.  CBG:  Recent Labs Lab 07/08/16 1433  GLUCAP 121*    Microbiology: No results found for this or any previous visit.  Coagulation Studies: No results for input(s): LABPROT, INR in the last 72 hours.  Imaging: Dg Chest 2 View  Result Date: 07/08/2016 CLINICAL DATA:  Larey SeatFell yesterday with pain EXAM: CHEST  2 VIEW COMPARISON:  Chest x-ray of 01/04/2015 FINDINGS: There is mild bibasilar linear atelectasis present. No pneumonia or effusion is seen. Mediastinal and hilar contours are unremarkable. The heart is borderline enlarged. No bony abnormality is seen. IMPRESSION: Mild bibasilar atelectasis.  No active process. Electronically Signed   By: Dwyane DeePaul  Barry M.D.   On: 07/08/2016 11:51   Dg Pelvis 1-2 Views  Result Date: 07/08/2016 CLINICAL DATA:  Larey SeatFell yesterday with pain EXAM: PELVIS - 1-2 VIEW COMPARISON:  Lumbar spine films of 04/24/2013 FINDINGS: The femoral heads are in normal position and the hip joint spaces are relatively normal for age. The pelvic rami are intact. The SI joints are corticated. There are degenerative changes in the lower lumbar spine. IMPRESSION: No acute fracture.  Degenerative change of the lower lumbar spine. Electronically Signed   By: Dwyane DeePaul  Barry M.D.   On: 07/08/2016 11:54   Ct Head Wo Contrast  Result Date: 07/08/2016 CLINICAL DATA:  82 year old male found down at home this morning. Initial encounter. EXAM: CT HEAD WITHOUT CONTRAST CT CERVICAL SPINE WITHOUT CONTRAST TECHNIQUE: Multidetector CT imaging of the head and cervical spine was performed following the standard protocol without intravenous contrast. Multiplanar CT image  reconstructions of the cervical spine were also generated. COMPARISON:  Brain MRI 06/12/2015. Head and cervical spine CT 05/28/2013 FINDINGS: CT HEAD FINDINGS Brain: Stable gray-white matter differentiation throughout the brain. No midline shift, ventriculomegaly, mass effect, evidence of mass lesion, intracranial hemorrhage or evidence of cortically based acute infarction. No cortical encephalomalacia identified. Vascular: Calcified atherosclerosis at the skull base. Skull: No acute osseous abnormality identified. Sinuses/Orbits: Visualized paranasal sinuses and mastoids are stable and well pneumatized. Other: No scalp hematoma identified. Stable orbits soft tissues aside from interval postoperative changes to the left globe. CT CERVICAL SPINE FINDINGS Alignment: Chronic straightening of cervical lordosis with mild anterolisthesis of C2 on C3. Cervicothoracic junction alignment is within normal limits. Bilateral posterior element alignment appears stable. Skull base and vertebrae: Visualized skull base is intact. No atlanto-occipital dissociation. 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 Spina bifida occulta at C7. No acute cervical spine fracture identified. Soft tissues and spinal canal: No prevertebral fluid or swelling. No visible canal hematoma. Retropharyngeal course of both carotid arteries. Calcified carotid atherosclerosis Disc levels: Severe diffuse cervical disc space loss and bulky endplate spurring, relatively sparing the C2-C3 level. Chronic degenerative cervical spinal stenosis appears most pronounced at C3-C4, moderate. Upper chest: Visible upper thoracic levels appear intact. Negative lung apices. IMPRESSION: 1. No acute intracranial abnormality. Stable non contrast CT appearance of the brain. 2. Chronic severe cervical spine degeneration. No acute fracture or listhesis in the cervical spine. Electronically Signed   By: Odessa FlemingH  Hall M.D.   On: 07/08/2016 13:03   Ct Cervical Spine Wo  Contrast  Result Date: 07/08/2016 CLINICAL DATA:  82 year old male found down at home this morning. Initial encounter. EXAM: CT HEAD WITHOUT CONTRAST CT CERVICAL SPINE WITHOUT CONTRAST TECHNIQUE: Multidetector CT imaging of the head and cervical spine was performed following the standard protocol without intravenous contrast. Multiplanar CT image reconstructions of the cervical spine were also generated. COMPARISON:  Brain MRI 06/12/2015. Head and cervical spine CT 05/28/2013 FINDINGS: CT HEAD FINDINGS Brain: Stable gray-white matter differentiation throughout the brain. No midline shift, ventriculomegaly, mass effect, evidence of mass lesion, intracranial hemorrhage or evidence of cortically based acute infarction. No cortical encephalomalacia identified. Vascular: Calcified atherosclerosis at the skull  base. Skull: No acute osseous abnormality identified. Sinuses/Orbits: Visualized paranasal sinuses and mastoids are stable and well pneumatized. Other: No scalp hematoma identified. Stable orbits soft tissues aside from interval postoperative changes to the left globe. CT CERVICAL SPINE FINDINGS Alignment: Chronic straightening of cervical lordosis with mild anterolisthesis of C2 on C3. Cervicothoracic junction alignment is within normal limits. Bilateral posterior element alignment appears stable. Skull base and vertebrae: Visualized skull base is intact. No atlanto-occipital dissociation. 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 Spina bifida occulta at C7. No acute cervical spine fracture identified. Soft tissues and spinal canal: No prevertebral fluid or swelling. No visible canal hematoma. Retropharyngeal course of both carotid arteries. Calcified carotid atherosclerosis Disc levels: Severe diffuse cervical disc space loss and bulky endplate spurring, relatively sparing the C2-C3 level. Chronic degenerative cervical spinal stenosis appears most pronounced at C3-C4, moderate. Upper chest: Visible  upper thoracic levels appear intact. Negative lung apices. IMPRESSION: 1. No acute intracranial abnormality. Stable non contrast CT appearance of the brain. 2. Chronic severe cervical spine degeneration. No acute fracture or listhesis in the cervical spine. Electronically Signed   By: Odessa Fleming M.D.   On: 07/08/2016 13:03   Dg Knee Complete 4 Views Right  Result Date: 07/08/2016 CLINICAL DATA:  Larey Seat yesterday with right knee pain EXAM: RIGHT KNEE - COMPLETE 4+ VIEW COMPARISON:  Right knee films of 01/09/2013 FINDINGS: There has been slight interval progression of tricompartmental degenerative joint disease the right knee. Slight loss of medial compartment and patellofemoral compartment joint spaces is noted with sclerosis and spurring. No fracture is seen. There may be a small amount of joint fluid present. As noted previously there is faint chondrocalcinosis present which could indicate CPPD arthropathy. Arterial calcifications are present. IMPRESSION: 1. Interval progression of tricompartmental degenerative joint disease of the right knee primarily involving the medial and patellofemoral compartments. 2. Faint chondrocalcinosis may indicate CPPD arthropathy. Electronically Signed   By: Dwyane Dee M.D.   On: 07/08/2016 11:53    Assessment and plan per attending neurologist  Felicie Morn PA-C [facility] [phone]  07/08/2016, 3:25 PM   Assessment/Plan: 82 year old male presenting to the hospital after patient lost balance and twisted his right knee.  1. Status post incident he has had medial pain in the right knee. Most likely he has torn his medial meniscus. At this time would recommend MRI of right knee.  2. There is no neurological deficit on follow up exam by attending Neurologist. Limb strength is 5/5 in all 4 extremities, with no asymmetry. No facial droop, ataxia, sensory deficit or aphasia noted.    Electronically signed: Dr. Caryl Pina

## 2016-07-08 NOTE — Telephone Encounter (Signed)
Wife called to advise Kandee KeenCory pt is in the ED with high bp issues

## 2016-07-08 NOTE — Telephone Encounter (Signed)
I will route to Cory as FYI.  

## 2016-07-08 NOTE — ED Provider Notes (Signed)
[facility]-EMERGENCY DEPT Provider Note   CSN: [id number] Arrival date & time: 07/08/16  0935     History   Chief Complaint Chief Complaint  Patient presents with  . Altered Mental Status    HPI TAHA DIMOND is a 82 y.o. male.  Patient states that he fell yesterday. And then he fell again this morning was unable to get up. Patient complains of weakness to right knee some discomfort   The history is provided by the patient.  Fall  This is a new problem. The current episode started 12 to 24 hours ago. The problem occurs rarely. The problem has been resolved. Pertinent negatives include no chest pain, no abdominal pain and no headaches. Nothing aggravates the symptoms. Nothing relieves the symptoms.    Past Medical History:  Diagnosis Date  . Coronary artery disease    Moderate LAD, Diagonal, Circumflex disease by cath 2004  . Dementia   . Diabetes mellitus without complication (HCC)   . Hyperlipidemia   . Hypertension     Patient Active Problem List   Diagnosis Date Noted  . Upper airway cough syndrome 01/31/2015  . Dyspnea 01/31/2015  . Special screening for malignant neoplasms, colon 06/01/2013  . Mild cognitive impairment with memory loss 06/01/2013  . Unsteady gait 05/08/2013  . Type 1 diabetes mellitus (HCC) 08/20/2009  . Hyperlipidemia 08/18/2008  . Essential hypertension 08/18/2008  . INTRAOCULAR LENS IMPLANT, HX OF 08/18/2008  . CATARACT EXTRACTION, RIGHT EYE, HX OF 08/18/2008  . Diabetes (HCC) 08/17/2008  . CORONARY ATHEROSCLEROSIS NATIVE CORONARY ARTERY 08/13/2008    Past Surgical History:  Procedure Laterality Date  . CATARACT EXTRACTION, BILATERAL         Home Medications    Prior to Admission medications   Medication Sig Start Date End Date Taking? Authorizing Provider  ACCU-CHEK FASTCLIX LANCETS MISC Use to check blood sugar 2 times per day. 03/09/16  Yes Romero Belling, MD  ACCU-CHEK SOFTCLIX LANCETS lancets USE TO TEST BLOOD SUGAR TWICE DAILY  06/18/16  Yes Romero Belling, MD  atorvastatin (LIPITOR) 80 MG tablet TAKE 1 TABLET BY MOUTH ONCE DAILY 05/22/16  Yes Kathleene Hazel, MD  bisoprolol (ZEBETA) 5 MG tablet Take 1 tablet (5 mg total) by mouth 2 (two) times daily. 06/04/16  Yes Shirline Frees, NP  clopidogrel (PLAVIX) 75 MG tablet TAKE 1 TABLET BY MOUTH ONCE DAILY WITH BREAKFAST 01/02/16  Yes Kathleene Hazel, MD  glucose blood (ACCU-CHEK AVIVA PLUS) test strip Use to check blood sugar 2 times per day. Dx code: E11.9 06/15/16  Yes Romero Belling, MD  Insulin Glargine (LANTUS SOLOSTAR) 100 UNIT/ML Solostar Pen Inject 75 Units into the skin every morning. And pen needles 1/day 03/11/16  Yes Romero Belling, MD  Melatonin 3 MG TABS Take 3 mg by mouth at bedtime. 02/11/16  Yes Historical Provider, MD  omeprazole (PRILOSEC OTC) 20 MG tablet Take 20 mg by mouth daily.   Yes Historical Provider, MD  SM ASPIRIN ADULT LOW STRENGTH 81 MG EC tablet TAKE 1 TABLET (81MG ) BY MOUTH ONCE DAILY 04/24/16  Yes Kathleene Hazel, MD  traMADol (ULTRAM) 50 MG tablet Take 1 tablet (50 mg total) by mouth 2 (two) times daily as needed for severe pain. 09/17/14  Yes Eulis Foster, FNP  valsartan (DIOVAN) 80 MG tablet Take 1 tablet (80 mg total) by mouth daily. 06/18/16  Yes Shirline Frees, NP    Family History Family History  Problem Relation Age of Onset  . Other Father  Deceased  . Other Mother     Deceased, 73  . Other Sister     Deceased  . Cancer Sister     Deceased  . Healthy Son     Living  . Heart disease Son     Deceased, 30 from heart valve dysfunction  . Healthy Daughter     Social History Social History  Substance Use Topics  . Smoking status: Former Smoker    Years: 3.00    Quit date: 04/13/1965  . Smokeless tobacco: Never Used  . Alcohol use No     Allergies   Hydrocodone-acetaminophen   Review of Systems Review of Systems  Constitutional: Negative for appetite change and fatigue.  HENT: Negative for congestion, ear  discharge and sinus pressure.   Eyes: Negative for discharge.  Respiratory: Negative for cough.   Cardiovascular: Negative for chest pain.  Gastrointestinal: Negative for abdominal pain and diarrhea.  Genitourinary: Negative for frequency and hematuria.  Musculoskeletal: Negative for back pain.  Skin: Negative for rash.  Neurological: Negative for seizures and headaches.       Weakness in right leg  Psychiatric/Behavioral: Negative for hallucinations.     Physical Exam Updated Vital Signs BP 132/85   Pulse 66   Temp 98.5 F (36.9 C) (Oral)   Resp 17   Ht 5' 8.5" (1.74 m)   Wt 205 lb (93 kg)   SpO2 98%   BMI 30.72 kg/m   Physical Exam  Constitutional: He is oriented to person, place, and time. He appears well-developed.  HENT:  Head: Normocephalic.  Eyes: Conjunctivae and EOM are normal. No scleral icterus.  Neck: Neck supple. No thyromegaly present.  Cardiovascular: Normal rate and regular rhythm.  Exam reveals no gallop and no friction rub.   No murmur heard. Pulmonary/Chest: No stridor. He has no wheezes. He has no rales. He exhibits no tenderness.  Abdominal: He exhibits no distension. There is no tenderness. There is no rebound.  Musculoskeletal: Normal range of motion. He exhibits no edema.  Patient has mild tenderness to medial right knee. He is unable to walk with his right leg with minimal discomfort and moderate weakness  Lymphadenopathy:    He has no cervical adenopathy.  Neurological: He is oriented to person, place, and time. He exhibits normal muscle tone. Coordination normal.  Skin: No rash noted. No erythema.  Psychiatric: He has a normal mood and affect. His behavior is normal.     ED Treatments / Results  Labs (all labs ordered are listed, but only abnormal results are displayed) Labs Reviewed  CBC WITH DIFFERENTIAL/PLATELET - Abnormal; Notable for the following:       Result Value   WBC 12.0 (*)    Neutro Abs 8.6 (*)    Monocytes Absolute 1.2  (*)    All other components within normal limits  COMPREHENSIVE METABOLIC PANEL - Abnormal; Notable for the following:    Glucose, Bld 63 (*)    GFR calc non Af Amer 59 (*)    All other components within normal limits  CBG MONITORING, ED - Abnormal; Notable for the following:    Glucose-Capillary 121 (*)    All other components within normal limits  URINALYSIS, ROUTINE W REFLEX MICROSCOPIC  CK  I-STAT TROPOININ, ED    EKG  EKG Interpretation None       Radiology Dg Chest 2 View  Result Date: 07/08/2016 CLINICAL DATA:  Larey Seat yesterday with pain EXAM: CHEST  2 VIEW COMPARISON:  Chest x-ray  of 01/04/2015 FINDINGS: There is mild bibasilar linear atelectasis present. No pneumonia or effusion is seen. Mediastinal and hilar contours are unremarkable. The heart is borderline enlarged. No bony abnormality is seen. IMPRESSION: Mild bibasilar atelectasis.  No active process. Electronically Signed   By: Dwyane Dee M.D.   On: 07/08/2016 11:51   Dg Pelvis 1-2 Views  Result Date: 07/08/2016 CLINICAL DATA:  Larey Seat yesterday with pain EXAM: PELVIS - 1-2 VIEW COMPARISON:  Lumbar spine films of 04/24/2013 FINDINGS: The femoral heads are in normal position and the hip joint spaces are relatively normal for age. The pelvic rami are intact. The SI joints are corticated. There are degenerative changes in the lower lumbar spine. IMPRESSION: No acute fracture.  Degenerative change of the lower lumbar spine. Electronically Signed   By: Dwyane Dee M.D.   On: 07/08/2016 11:54   Ct Head Wo Contrast  Result Date: 07/08/2016 CLINICAL DATA:  82 year old male found down at home this morning. Initial encounter. EXAM: CT HEAD WITHOUT CONTRAST CT CERVICAL SPINE WITHOUT CONTRAST TECHNIQUE: Multidetector CT imaging of the head and cervical spine was performed following the standard protocol without intravenous contrast. Multiplanar CT image reconstructions of the cervical spine were also generated. COMPARISON:  Brain MRI  06/12/2015. Head and cervical spine CT 05/28/2013 FINDINGS: CT HEAD FINDINGS Brain: Stable gray-white matter differentiation throughout the brain. No midline shift, ventriculomegaly, mass effect, evidence of mass lesion, intracranial hemorrhage or evidence of cortically based acute infarction. No cortical encephalomalacia identified. Vascular: Calcified atherosclerosis at the skull base. Skull: No acute osseous abnormality identified. Sinuses/Orbits: Visualized paranasal sinuses and mastoids are stable and well pneumatized. Other: No scalp hematoma identified. Stable orbits soft tissues aside from interval postoperative changes to the left globe. CT CERVICAL SPINE FINDINGS Alignment: Chronic straightening of cervical lordosis with mild anterolisthesis of C2 on C3. Cervicothoracic junction alignment is within normal limits. Bilateral posterior element alignment appears stable. Skull base and vertebrae: Visualized skull base is intact. No atlanto-occipital dissociation. 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 Spina bifida occulta at C7. No acute cervical spine fracture identified. Soft tissues and spinal canal: No prevertebral fluid or swelling. No visible canal hematoma. Retropharyngeal course of both carotid arteries. Calcified carotid atherosclerosis Disc levels: Severe diffuse cervical disc space loss and bulky endplate spurring, relatively sparing the C2-C3 level. Chronic degenerative cervical spinal stenosis appears most pronounced at C3-C4, moderate. Upper chest: Visible upper thoracic levels appear intact. Negative lung apices. IMPRESSION: 1. No acute intracranial abnormality. Stable non contrast CT appearance of the brain. 2. Chronic severe cervical spine degeneration. No acute fracture or listhesis in the cervical spine. Electronically Signed   By: Odessa Fleming M.D.   On: 07/08/2016 13:03   Ct Cervical Spine Wo Contrast  Result Date: 07/08/2016 CLINICAL DATA:  82 year old male found down at  home this morning. Initial encounter. EXAM: CT HEAD WITHOUT CONTRAST CT CERVICAL SPINE WITHOUT CONTRAST TECHNIQUE: Multidetector CT imaging of the head and cervical spine was performed following the standard protocol without intravenous contrast. Multiplanar CT image reconstructions of the cervical spine were also generated. COMPARISON:  Brain MRI 06/12/2015. Head and cervical spine CT 05/28/2013 FINDINGS: CT HEAD FINDINGS Brain: Stable gray-white matter differentiation throughout the brain. No midline shift, ventriculomegaly, mass effect, evidence of mass lesion, intracranial hemorrhage or evidence of cortically based acute infarction. No cortical encephalomalacia identified. Vascular: Calcified atherosclerosis at the skull base. Skull: No acute osseous abnormality identified. Sinuses/Orbits: Visualized paranasal sinuses and mastoids are stable and well pneumatized. Other:  No scalp hematoma identified. Stable orbits soft tissues aside from interval postoperative changes to the left globe. CT CERVICAL SPINE FINDINGS Alignment: Chronic straightening of cervical lordosis with mild anterolisthesis of C2 on C3. Cervicothoracic junction alignment is within normal limits. Bilateral posterior element alignment appears stable. Skull base and vertebrae: Visualized skull base is intact. No atlanto-occipital dissociation. 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 Spina bifida occulta at C7. No acute cervical spine fracture identified. Soft tissues and spinal canal: No prevertebral fluid or swelling. No visible canal hematoma. Retropharyngeal course of both carotid arteries. Calcified carotid atherosclerosis Disc levels: Severe diffuse cervical disc space loss and bulky endplate spurring, relatively sparing the C2-C3 level. Chronic degenerative cervical spinal stenosis appears most pronounced at C3-C4, moderate. Upper chest: Visible upper thoracic levels appear intact. Negative lung apices. IMPRESSION: 1. No acute  intracranial abnormality. Stable non contrast CT appearance of the brain. 2. Chronic severe cervical spine degeneration. No acute fracture or listhesis in the cervical spine. Electronically Signed   By: Odessa FlemingH  Hall M.D.   On: 07/08/2016 13:03   Dg Knee Complete 4 Views Right  Result Date: 07/08/2016 CLINICAL DATA:  Larey SeatFell yesterday with right knee pain EXAM: RIGHT KNEE - COMPLETE 4+ VIEW COMPARISON:  Right knee films of 01/09/2013 FINDINGS: There has been slight interval progression of tricompartmental degenerative joint disease the right knee. Slight loss of medial compartment and patellofemoral compartment joint spaces is noted with sclerosis and spurring. No fracture is seen. There may be a small amount of joint fluid present. As noted previously there is faint chondrocalcinosis present which could indicate CPPD arthropathy. Arterial calcifications are present. IMPRESSION: 1. Interval progression of tricompartmental degenerative joint disease of the right knee primarily involving the medial and patellofemoral compartments. 2. Faint chondrocalcinosis may indicate CPPD arthropathy. Electronically Signed   By: Dwyane DeePaul  Barry M.D.   On: 07/08/2016 11:53    Procedures Procedures (including critical care time)  Medications Ordered in ED Medications  sodium chloride 0.9 % bolus 1,000 mL (0 mLs Intravenous Stopped 07/08/16 1447)  dextrose 50 % solution 25 mL (25 mLs Intravenous Given 07/08/16 1339)     Initial Impression / Assessment and Plan / ED Course  I have reviewed the triage vital signs and the nursing notes.  Pertinent labs & imaging results that were available during my care of the patient were reviewed by me and considered in my medical decision making (see chart for details).   patient was seen by neurology and it was felt the patient did not have a stroke. He'll be admitted by medicine  Final Clinical Impressions(s) / ED Diagnoses   Final diagnoses:  Fall, initial encounter    New  Prescriptions New Prescriptions   No medications on file     Bethann BerkshireJoseph Quenna Doepke, MD 07/08/16 1539

## 2016-07-08 NOTE — ED Notes (Signed)
Pt returned from Radiology.

## 2016-07-08 NOTE — ED Notes (Signed)
Pt wife asked if Pt can eat/drink. Dr. Estell HarpinZammit approved.

## 2016-07-08 NOTE — ED Notes (Signed)
Patient returned from MRI.

## 2016-07-08 NOTE — ED Notes (Signed)
Hospitalist at the bedside 

## 2016-07-08 NOTE — ED Triage Notes (Signed)
Per [facility], Pt is coming from home. Hx of Dementia. Yesterday, pt fell outside out on his back, but had no injury. Wife reports that she came home this morning and found on the floor. Reported to get out of bed and fall. Denies head injury and reports was down for three hours. Pt has some Altered Mental Status. Originally, pt was HTN at 210/110. Last Vitals per EMS: 123/95, 70 HR, 97% on RA, 80 CBG.

## 2016-07-09 ENCOUNTER — Encounter (HOSPITAL_COMMUNITY): Payer: Self-pay | Admitting: General Practice

## 2016-07-09 DIAGNOSIS — M25561 Pain in right knee: Secondary | ICD-10-CM | POA: Diagnosis not present

## 2016-07-09 DIAGNOSIS — G3184 Mild cognitive impairment, so stated: Secondary | ICD-10-CM | POA: Diagnosis not present

## 2016-07-09 DIAGNOSIS — E7800 Pure hypercholesterolemia, unspecified: Secondary | ICD-10-CM | POA: Diagnosis not present

## 2016-07-09 DIAGNOSIS — E108 Type 1 diabetes mellitus with unspecified complications: Secondary | ICD-10-CM | POA: Diagnosis not present

## 2016-07-09 DIAGNOSIS — I10 Essential (primary) hypertension: Secondary | ICD-10-CM | POA: Diagnosis not present

## 2016-07-09 DIAGNOSIS — R2689 Other abnormalities of gait and mobility: Secondary | ICD-10-CM | POA: Diagnosis not present

## 2016-07-09 DIAGNOSIS — I2510 Atherosclerotic heart disease of native coronary artery without angina pectoris: Secondary | ICD-10-CM | POA: Diagnosis not present

## 2016-07-09 DIAGNOSIS — E162 Hypoglycemia, unspecified: Secondary | ICD-10-CM | POA: Diagnosis not present

## 2016-07-09 LAB — GLUCOSE, CAPILLARY
GLUCOSE-CAPILLARY: 186 mg/dL — AB (ref 65–99)
GLUCOSE-CAPILLARY: 162 mg/dL — AB (ref 65–99)
GLUCOSE-CAPILLARY: 101 mg/dL — AB (ref 65–99)
Glucose-Capillary: 155 mg/dL — ABNORMAL HIGH (ref 65–99)
Glucose-Capillary: 48 mg/dL — ABNORMAL LOW (ref 65–99)
Glucose-Capillary: 114 mg/dL — ABNORMAL HIGH (ref 65–99)

## 2016-07-09 LAB — HEMOGLOBIN A1C
Hgb A1c MFr Bld: 8 % — ABNORMAL HIGH (ref 4.8–5.6)
Mean Plasma Glucose: 183 mg/dL

## 2016-07-09 MED ORDER — DEXTROSE 50 % IV SOLN
INTRAVENOUS | Status: AC
  Filled 2016-07-09: qty 50

## 2016-07-09 MED ORDER — ACCU-CHEK SOFTCLIX LANCETS MISC: 2 refills | Status: DC

## 2016-07-09 MED ORDER — PNEUMOCOCCAL VAC POLYVALENT 25 MCG/0.5ML IJ INJ: 0.5000 mL | INJECTION | INTRAMUSCULAR | Status: DC

## 2016-07-09 MED ORDER — INFLUENZA VAC SPLIT QUAD 0.5 ML IM SUSY: 0.5000 mL | PREFILLED_SYRINGE | INTRAMUSCULAR | Status: DC

## 2016-07-09 MED ORDER — ACETAMINOPHEN 325 MG PO TABS: 650.0000 mg | ORAL_TABLET | Freq: Four times a day (QID) | ORAL | Status: AC | PRN

## 2016-07-09 MED ORDER — INFLUENZA VAC SPLIT QUAD 0.5 ML IM SUSY
0.5000 mL | PREFILLED_SYRINGE | INTRAMUSCULAR | Status: AC | PRN
  Administered 2016-07-09: 0.5 mL via INTRAMUSCULAR
  Filled 2016-07-09: qty 0.5

## 2016-07-09 MED ORDER — INSULIN GLARGINE 100 UNIT/ML SOLOSTAR PEN: 25.0000 [IU] | PEN_INJECTOR | SUBCUTANEOUS | 11 refills | Status: DC

## 2016-07-09 MED ORDER — INSULIN GLARGINE 100 UNIT/ML ~~LOC~~ SOLN
25.0000 [IU] | Freq: Every day | SUBCUTANEOUS | Status: DC
  Filled 2016-07-09: qty 0.25

## 2016-07-09 MED ORDER — DEXTROSE 50 % IV SOLN
50.0000 mL | Freq: Once | INTRAVENOUS | Status: AC
  Administered 2016-07-09: 50 mL via INTRAVENOUS

## 2016-07-09 MED ORDER — GLUCOSE BLOOD VI STRP: ORAL_STRIP | 12 refills | Status: DC

## 2016-07-09 MED ORDER — PNEUMOCOCCAL VAC POLYVALENT 25 MCG/0.5ML IJ INJ
0.5000 mL | INJECTION | INTRAMUSCULAR | Status: AC | PRN
  Administered 2016-07-09: 0.5 mL via INTRAMUSCULAR
  Filled 2016-07-09: qty 0.5

## 2016-07-09 NOTE — Progress Notes (Addendum)
PROGRESS NOTE    William Aguirre  WUJ:[id number]  DOB: [birth year]  DOA: 07/08/2016 PCP: Shirline Frees, NP Outpatient Specialists:   Hospital course: William Aguirre is a 82 y.o. male with insulin requiring diabetes mellitus presented to the emergency department after wife came home and found him on the floor. She had been working nights. The patient reports that he tripped on some furniture and fell on the floor. The patient is complaining of severe right knee pain. He also complains of weakness.   Assessment & Plan:   1. Fall at home with subsequent right knee pain-unable to bear weight secondary to pain, admitted for observation,  MRI of the right knee shows medial meniscus tear.  Pt continues to have pain and swelling.  Requested orthopedics consultation.  Consulted PT for evaluation and assistance with ambulation.  Tramadol ordered for pain in addition to Tylenol. The patient does not tolerate hydrocodone by history. 2. Fall- CK levels are OK, will stop IVF hydration. 3. Hypoglycemia secondary to insulin-suspect his basal insulin dose is too high. Based on weight reduced lantus from 75 unit to 30 units, will further reduce to 25 units 3/29, continue to provide prandial and sliding coverage while in hospital. Hold prandial insulin if eats less than 50% of meal.  Patient is to follow-up with his endocrinologist outpatient. Monitor blood glucose closely in the hospital. Hemoglobin A1c 8.0%.  Observe hypoglycemia precautions.  4. Cognitive impairment with memory loss-reported as mild, wife reports that she has been managing patient fairly well at home. 5. Essential hypertension-resume home medications. 6. Coronary artery disease-resume aspirin daily.   DVT Prophylaxis: lovenox     Code Status: full  Family Communication: wife  Disposition Plan: home    Consultants:  orthopedics  Subjective: Pt still having difficulty with ambulation.  Had a low BS last night.  Not eating very well.   Waiting for PT eval.   Objective: Vitals:   07/08/16 1645 07/08/16 1849 07/08/16 2025 07/09/16 0451  BP: 130/69 102/89 133/66 (!) 143/49  Pulse: 66 75 74 65  Resp: 15 18 18 20   Temp:  97.8 F (36.6 C) 98.2 F (36.8 C) 97.5 F (36.4 C)  TempSrc:  Oral Oral Oral  SpO2: 99% 99% 99% 100%  Weight:  91.6 kg (202 lb)    Height:  5\' 9"  (1.753 m)      Intake/Output Summary (Last 24 hours) at 07/09/16 0755 Last data filed at 07/09/16 0700  Gross per 24 hour  Intake          2786.25 ml  Output              300 ml  Net          2486.25 ml   Filed Weights   07/08/16 0952 07/08/16 1849  Weight: 93 kg (205 lb) 91.6 kg (202 lb)    Exam:  General exam: awake, alert, NAD. Cooperative.  Respiratory system: Clear. No increased work of breathing. Cardiovascular system: S1 & S2 heard, RRR. No JVD, murmurs, gallops, clicks or pedal edema. Gastrointestinal system: Abdomen is nondistended, soft and nontender. Normal bowel sounds heard. Central nervous system: Alert and oriented. No focal neurological deficits. Extremities: swollen and tender right knee with crepitus of right patella.  Data Reviewed: Basic Metabolic Panel:  Recent Labs Lab 07/08/16 1100  NA 139  K 4.3  CL 105  CO2 25  GLUCOSE 63*  BUN 14  CREATININE 1.14  CALCIUM 9.5   Liver Function Tests:  Recent Labs Lab 07/08/16 1100  AST 27  ALT 25  ALKPHOS 99  BILITOT 0.6  PROT 7.8  ALBUMIN 3.9   No results for input(s): LIPASE, AMYLASE in the last 168 hours. No results for input(s): AMMONIA in the last 168 hours. CBC:  Recent Labs Lab 07/08/16 1100  WBC 12.0*  NEUTROABS 8.6*  HGB 14.5  HCT 43.3  MCV 83.1  PLT 198   Cardiac Enzymes:  Recent Labs Lab 07/08/16 1534  CKTOTAL 346   CBG (last 3)   Recent Labs  07/09/16 0409 07/09/16 0425 07/09/16 0619  GLUCAP 48* 186* 162*   No results found for this or any previous visit (from the past 240 hour(s)).   Studies: Dg Chest 2 View  Result Date:  07/08/2016 CLINICAL DATA:  Larey Seat yesterday with pain EXAM: CHEST  2 VIEW COMPARISON:  Chest x-ray of 01/04/2015 FINDINGS: There is mild bibasilar linear atelectasis present. No pneumonia or effusion is seen. Mediastinal and hilar contours are unremarkable. The heart is borderline enlarged. No bony abnormality is seen. IMPRESSION: Mild bibasilar atelectasis.  No active process. Electronically Signed   By: Dwyane Dee M.D.   On: 07/08/2016 11:51   Dg Pelvis 1-2 Views  Result Date: 07/08/2016 CLINICAL DATA:  Larey Seat yesterday with pain EXAM: PELVIS - 1-2 VIEW COMPARISON:  Lumbar spine films of 04/24/2013 FINDINGS: The femoral heads are in normal position and the hip joint spaces are relatively normal for age. The pelvic rami are intact. The SI joints are corticated. There are degenerative changes in the lower lumbar spine. IMPRESSION: No acute fracture.  Degenerative change of the lower lumbar spine. Electronically Signed   By: Dwyane Dee M.D.   On: 07/08/2016 11:54   Ct Head Wo Contrast  Result Date: 07/08/2016 CLINICAL DATA:  82 year old male found down at home this morning. Initial encounter. EXAM: CT HEAD WITHOUT CONTRAST CT CERVICAL SPINE WITHOUT CONTRAST TECHNIQUE: Multidetector CT imaging of the head and cervical spine was performed following the standard protocol without intravenous contrast. Multiplanar CT image reconstructions of the cervical spine were also generated. COMPARISON:  Brain MRI 06/12/2015. Head and cervical spine CT 05/28/2013 FINDINGS: CT HEAD FINDINGS Brain: Stable gray-white matter differentiation throughout the brain. No midline shift, ventriculomegaly, mass effect, evidence of mass lesion, intracranial hemorrhage or evidence of cortically based acute infarction. No cortical encephalomalacia identified. Vascular: Calcified atherosclerosis at the skull base. Skull: No acute osseous abnormality identified. Sinuses/Orbits: Visualized paranasal sinuses and mastoids are stable and well  pneumatized. Other: No scalp hematoma identified. Stable orbits soft tissues aside from interval postoperative changes to the left globe. CT CERVICAL SPINE FINDINGS Alignment: Chronic straightening of cervical lordosis with mild anterolisthesis of C2 on C3. Cervicothoracic junction alignment is within normal limits. Bilateral posterior element alignment appears stable. Skull base and vertebrae: Visualized skull base is intact. No atlanto-occipital dissociation. 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 Spina bifida occulta at C7. No acute cervical spine fracture identified. Soft tissues and spinal canal: No prevertebral fluid or swelling. No visible canal hematoma. Retropharyngeal course of both carotid arteries. Calcified carotid atherosclerosis Disc levels: Severe diffuse cervical disc space loss and bulky endplate spurring, relatively sparing the C2-C3 level. Chronic degenerative cervical spinal stenosis appears most pronounced at C3-C4, moderate. Upper chest: Visible upper thoracic levels appear intact. Negative lung apices. IMPRESSION: 1. No acute intracranial abnormality. Stable non contrast CT appearance of the brain. 2. Chronic severe cervical spine degeneration. No acute fracture or listhesis in the cervical spine. Electronically  Signed   By: Odessa Fleming M.D.   On: 07/08/2016 13:03   Ct Cervical Spine Wo Contrast  Result Date: 07/08/2016 CLINICAL DATA:  82 year old male found down at home this morning. Initial encounter. EXAM: CT HEAD WITHOUT CONTRAST CT CERVICAL SPINE WITHOUT CONTRAST TECHNIQUE: Multidetector CT imaging of the head and cervical spine was performed following the standard protocol without intravenous contrast. Multiplanar CT image reconstructions of the cervical spine were also generated. COMPARISON:  Brain MRI 06/12/2015. Head and cervical spine CT 05/28/2013 FINDINGS: CT HEAD FINDINGS Brain: Stable gray-white matter differentiation throughout the brain. No midline shift,  ventriculomegaly, mass effect, evidence of mass lesion, intracranial hemorrhage or evidence of cortically based acute infarction. No cortical encephalomalacia identified. Vascular: Calcified atherosclerosis at the skull base. Skull: No acute osseous abnormality identified. Sinuses/Orbits: Visualized paranasal sinuses and mastoids are stable and well pneumatized. Other: No scalp hematoma identified. Stable orbits soft tissues aside from interval postoperative changes to the left globe. CT CERVICAL SPINE FINDINGS Alignment: Chronic straightening of cervical lordosis with mild anterolisthesis of C2 on C3. Cervicothoracic junction alignment is within normal limits. Bilateral posterior element alignment appears stable. Skull base and vertebrae: Visualized skull base is intact. No atlanto-occipital dissociation. 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 Spina bifida occulta at C7. No acute cervical spine fracture identified. Soft tissues and spinal canal: No prevertebral fluid or swelling. No visible canal hematoma. Retropharyngeal course of both carotid arteries. Calcified carotid atherosclerosis Disc levels: Severe diffuse cervical disc space loss and bulky endplate spurring, relatively sparing the C2-C3 level. Chronic degenerative cervical spinal stenosis appears most pronounced at C3-C4, moderate. Upper chest: Visible upper thoracic levels appear intact. Negative lung apices. IMPRESSION: 1. No acute intracranial abnormality. Stable non contrast CT appearance of the brain. 2. Chronic severe cervical spine degeneration. No acute fracture or listhesis in the cervical spine. Electronically Signed   By: Odessa Fleming M.D.   On: 07/08/2016 13:03   Mr Knee Right Wo Contrast  Result Date: 07/08/2016 CLINICAL DATA:  Right knee pain after fall. EXAM: MRI OF THE RIGHT KNEE WITHOUT CONTRAST TECHNIQUE: Multiplanar, multisequence MR imaging of the knee was performed. No intravenous contrast was administered. COMPARISON:   None. FINDINGS: MENISCI Medial meniscus: Complex predominantly horizontal tear of the posterior horn of the medial meniscus surfacing anteriorly in the white zone and inferiorly in the red zone, series 8, images 22 and 23. Lateral meniscus:  Intact LIGAMENTS Cruciates:  Intact Collaterals: Thickening of the lateral collateral ligament with overlying soft tissue edema suspicious for LCL sprain. Intact medial collateral ligament complex. CARTILAGE Patellofemoral:  Chondromalacia of the lateral patellar cartilage. Medial: Irregular areas of chondral thinning with tiny 3 mm (transverse) full-thickness chondral defect along the weight-bearing portion of the medial femoral condylar cartilage, series 7, image 14. Lateral:  No chondral defect. Joint:  Moderate suprapatellar joint effusion. Popliteal Fossa: Small complex popliteal cyst measuring up to 2.5 cm in AP dimension intact popliteus. Extensor Mechanism:  Intact quadriceps tendon and patellar tendon. Bones: Mild bone contusion of the posteromedial tibial epiphysis. Tiny degenerative subchondral cyst deep to the medial trochlear notch. Other: None. IMPRESSION: 1. Complex tear of the posterior horn of the medial meniscus with dominant horizontal component surfacing superiorly in the white zone and inferiorly in the red zone. 2. Thickened appearance of the LCL with overlying soft tissue edema consistent with a grade 2 sprain. 3. Moderate suprapatellar joint effusion with small popliteal cyst. 4. Minimal bone contusion of the posteromedial tibial epiphysis. 5. Chondromalacia  lateral patellar cartilage with minimally irregular cartilage thinning along the medial femoral condylar cartilage. Electronically Signed   By: Tollie Ethavid  Kwon M.D.   On: 07/08/2016 18:27   Dg Knee Complete 4 Views Right  Result Date: 07/08/2016 CLINICAL DATA:  Larey SeatFell yesterday with right knee pain EXAM: RIGHT KNEE - COMPLETE 4+ VIEW COMPARISON:  Right knee films of 01/09/2013 FINDINGS: There has been  slight interval progression of tricompartmental degenerative joint disease the right knee. Slight loss of medial compartment and patellofemoral compartment joint spaces is noted with sclerosis and spurring. No fracture is seen. There may be a small amount of joint fluid present. As noted previously there is faint chondrocalcinosis present which could indicate CPPD arthropathy. Arterial calcifications are present. IMPRESSION: 1. Interval progression of tricompartmental degenerative joint disease of the right knee primarily involving the medial and patellofemoral compartments. 2. Faint chondrocalcinosis may indicate CPPD arthropathy. Electronically Signed   By: Dwyane DeePaul  Barry M.D.   On: 07/08/2016 11:53   Scheduled Meds: . aspirin EC  81 mg Oral Daily  . atorvastatin  80 mg Oral Daily  . bisoprolol  5 mg Oral BID  . clopidogrel  75 mg Oral Daily  . enoxaparin (LOVENOX) injection  40 mg Subcutaneous Q24H  . insulin aspart  0-15 Units Subcutaneous TID WC  . insulin aspart  6 Units Subcutaneous TID WC  . insulin glargine  30 Units Subcutaneous Daily  . irbesartan  75 mg Oral Daily  . pantoprazole  20 mg Oral Daily   Continuous Infusions: . sodium chloride 75 mL/hr (07/08/16 2031)    Principal Problem:   Right knee pain Active Problems:   Diabetes (HCC)   Type 1 diabetes mellitus (HCC)   Hyperlipidemia   Essential hypertension   CORONARY ATHEROSCLEROSIS NATIVE CORONARY ARTERY   Mild cognitive impairment with memory loss   Unable to bear weight   Hypoglycemia   Time spent:   Standley Dakinslanford Kirstina Leinweber, MD, FAAFP [facility] Pager [phone]  If 7PM-7AM, please contact night-coverage [email or web address] Password [email or web address] 07/09/2016, 7:55 AM    LOS: 0 days  '

## 2016-07-09 NOTE — Progress Notes (Signed)
Hypoglycemic Event  CBG: 48  Treatment: D50  Symptoms: confusion  Follow-up CBG: Time:0425 CBG Result186  Possible Reasons for Event: poor PO intake  Comments/MD notified:no    Manson PasseyBrown, Boykin PeekVeronica Scott

## 2016-07-09 NOTE — Consult Note (Signed)
      Subjective: Consulted by Dr. Standley Dakinslanford Johnson for a medial meniscal tear.      Objective:   VITALS:   Vitals:   07/09/16 0451 07/09/16 1300  BP: (!) 143/49 (!) 160/81  Pulse: 65 67  Resp: 20 20  Temp: 97.5 F (36.4 C) 97.7 F (36.5 C)     LABS  Recent Labs  07/08/16 1100  HGB 14.5  HCT 43.3  WBC 12.0*  PLT 198     Recent Labs  07/08/16 1100  NA 139  K 4.3  BUN 14  CREATININE 1.14  GLUCOSE 63*     Assessment/Plan:  I have discussed with Dr. Laural BenesJohnson that with regards to the meniscal tear we would see that in an outpatient setting. We will be happy to see him in the office next week and determine what we would have to do with regards to the knee.  The patient can be WBAT, but do to the pain would most likely be using an assistance device.  Rest, ice and elevation to the knee to help with swelling, inflammation and pain.     Anastasio AuerbachMatthew S. Lamir Racca   PAC  07/09/2016, 3:31 PM

## 2016-07-09 NOTE — Progress Notes (Signed)
Assisted primary RN in discharging pt. Reviewed discharge instructions and prescriptions with pt and family member, they denied questions. Equipment was delivered to pt's room.   [place], Latricia HeftKorie G

## 2016-07-09 NOTE — Discharge Summary (Signed)
Physician Discharge Summary  William Aguirre ZOX:[id number] DOB: 09-23-1934 DOA: 07/08/2016  PCP: Shirline Frees, NP  Admit date: 07/08/2016 Discharge date: 07/09/2016  Admitted From: Home  Disposition:  Home   Recommendations for Outpatient Follow-up:  1. Follow up with PCP in 1 weeks 2. Follow up with Dr. Charlann Boxer orthopedics in 1 week 3. Monitor blood sugars closely as he has been having hypoglycemia 4. Weight bearing as tolerated.  Fall precautions at home.  5. Please obtain BMP/CBC in one week  Discharge Condition: STABLE  CODE STATUS: FULL   Diet recommendation: Heart Healthy / Carb Modified / Regular / Dysphagia   Brief/Interim Summary: HPI: William Aguirre is a 82 y.o. male with insulin requiring diabetes mellitus presented to the emergency department after wife came home and found him on the floor. She had been working nights. The patient reports that he tripped on some furniture and fell on the floor. The patient is complaining of severe right knee pain. He also complains of weakness. He was noted to be hypoglycemic with a blood glucose of 63 on arrival. He said that his Lantus was recently increased in the last 2 months from 12 units to 75 units. He was taken off short acting insulin at the time.  He was also noted to have some initial slurred speech and confusion.  He does have known mild dementia. That has completely resolved at this time. He does have a history of TIA. He was evaluated by neurology in the emergency department and they felt that he was not having acute neurological symptoms. Unfortunately the patient has not been able to bear weight and stand since the fall. He is being admitted for observation and further evaluation of the right knee pain. Because we don't know how long he was down on the floor he is being evaluated for rhabdomyolysis.  1. Fall at home with subsequent right knee pain-unable to bear weight secondary to pain, admitted for observation,  MRI of the right  knee shows medial meniscus tear.  Pt continues to have pain and swelling.  Requested orthopedics consultation.  Consulted PT for evaluation and assistance with ambulation.  Tramadol ordered for pain in addition to Tylenol. The patient does not tolerate hydrocodone by history.  Ortho will see patient outpatient in office in 1 week,  Pt to see Dr. Charlann Boxer.  PT recommended HHPT, rolling walker and 3 in 1 which has been ordered.  Ortho recommending rest, ice and WBAT and follow up in 1 week.   2. Fall- CK levels are OK, will stop IVF hydration. 3. Hypoglycemia secondary to insulin-suspect his basal insulin dose is too high. Based on weight reduced lantus from 75 unit to 30 units, will further reduce to 25 units 3/29, continue to provide prandial and sliding coverage while in hospital. Hold prandial insulin if eats less than 50% of meal.  Patient is to follow-up with his endocrinologist outpatient and PCP. Monitor blood glucose closely in the hospital. Hemoglobin A1c 8.0%. Observe hypoglycemia precautions.  Recommend testing 4x per day.  Fall precautions at home and ordered for HHPT/ RN Aide SW concerned about severe hypoglycemia at home as he is home alone while wife works nights.  4. Cognitive impairment with memory loss-reported as mild, wife reports that she has been managing patient fairly well at home. 5. Essential hypertension-resume home medications. 6. Coronary artery disease-resume aspirin daily.  DVT Prophylaxis:lovenox Code Status:full Family Communication:wife Disposition Plan:home   Consultants:  Orthopedics (Pt will see them outpatient in  1 week - Dr. Charlann Boxer)  Discharge Diagnoses:  Principal Problem:   Right knee pain Active Problems:   Diabetes (HCC)   Type 1 diabetes mellitus (HCC)   Hyperlipidemia   Essential hypertension   CORONARY ATHEROSCLEROSIS NATIVE CORONARY ARTERY   Mild cognitive impairment with memory loss   Unable to bear weight   Hypoglycemia  Discharge  Instructions  Discharge Instructions    Increase activity slowly    Complete by:  As directed      Allergies as of 07/09/2016      Reactions   Hydrocodone-acetaminophen Other (See Comments)    Delusions      Medication List    TAKE these medications   ACCU-CHEK SOFTCLIX LANCETS lancets USE TO TEST BLOOD SUGAR 4 times  DAILY What changed:  additional instructions  Another medication with the same name was removed. Continue taking this medication, and follow the directions you see here.   acetaminophen 325 MG tablet Commonly known as:  TYLENOL Take 2 tablets (650 mg total) by mouth every 6 (six) hours as needed for mild pain (or Fever >/= 101).   atorvastatin 80 MG tablet Commonly known as:  LIPITOR TAKE 1 TABLET BY MOUTH ONCE DAILY   bisoprolol 5 MG tablet Commonly known as:  ZEBETA Take 1 tablet (5 mg total) by mouth 2 (two) times daily.   clopidogrel 75 MG tablet Commonly known as:  PLAVIX TAKE 1 TABLET BY MOUTH ONCE DAILY WITH BREAKFAST   glucose blood test strip Commonly known as:  ACCU-CHEK AVIVA PLUS Use to check blood sugar 4 times per day. Dx code: E11.9 What changed:  additional instructions   Insulin Glargine 100 UNIT/ML Solostar Pen Commonly known as:  LANTUS SOLOSTAR Inject 25 Units into the skin every morning. What changed:  how much to take  additional instructions   Melatonin 3 MG Tabs Take 3 mg by mouth at bedtime.   omeprazole 20 MG tablet Commonly known as:  PRILOSEC OTC Take 20 mg by mouth daily.   SM ASPIRIN ADULT LOW STRENGTH 81 MG EC tablet Generic drug:  aspirin TAKE 1 TABLET (81MG ) BY MOUTH ONCE DAILY   traMADol 50 MG tablet Commonly known as:  ULTRAM Take 1 tablet (50 mg total) by mouth 2 (two) times daily as needed for severe pain.   valsartan 80 MG tablet Commonly known as:  DIOVAN Take 1 tablet (80 mg total) by mouth daily.            Durable Medical Equipment        Start     Ordered   07/09/16 1546  DME  3-in-1  Once     07/09/16 1545   07/09/16 1546  For home use only DME Walker rolling  [facility])  Once    Question:  Patient needs a walker to treat with the following condition  Answer:  Gait instability   07/09/16 1545   07/09/16 1536  For home use only DME 3 n 1  Once     07/09/16 1535   07/09/16 1528  For home use only DME Walker rolling  Once    Question:  Patient needs a walker to treat with the following condition  Answer:  Medial meniscus tear   07/09/16 1527     Follow-up Information    Shirline Frees, NP. Schedule an appointment as soon as possible for a visit in 1 week(s).   Specialty:  Family Medicine Why:  Hospital Follow Up  Contact information: [phone] [address] [place] [address] [phone]        Shelda Pal, MD. Schedule an appointment as soon as possible for a visit in 1 week(s).   Specialty:  Orthopedic Surgery Why:  Hospital Follow Up right knee injury Contact information: [address] [place] [address] [phone]        Romero Belling, MD. Schedule an appointment as soon as possible for a visit in 2 week(s).   Specialty:  Endocrinology Why:  Hospital Follow Up  Contact information: [address] [place] [address] [phone]          Allergies  Allergen Reactions  . Hydrocodone-Acetaminophen Other (See Comments)     Delusions    Procedures/Studies: Dg Chest 2 View  Result Date: 07/08/2016 CLINICAL DATA:  Larey Seat yesterday with pain EXAM: CHEST  2 VIEW COMPARISON:  Chest x-ray of 01/04/2015 FINDINGS: There is mild bibasilar linear atelectasis present. No pneumonia or effusion is seen. Mediastinal and hilar contours are unremarkable. The heart is borderline enlarged. No bony abnormality is seen. IMPRESSION: Mild bibasilar atelectasis.  No active process. Electronically Signed   By: Dwyane Dee M.D.   On: 07/08/2016 11:51   Dg Pelvis 1-2 Views  Result Date: 07/08/2016 CLINICAL DATA:  Larey Seat  yesterday with pain EXAM: PELVIS - 1-2 VIEW COMPARISON:  Lumbar spine films of 04/24/2013 FINDINGS: The femoral heads are in normal position and the hip joint spaces are relatively normal for age. The pelvic rami are intact. The SI joints are corticated. There are degenerative changes in the lower lumbar spine. IMPRESSION: No acute fracture.  Degenerative change of the lower lumbar spine. Electronically Signed   By: Dwyane Dee M.D.   On: 07/08/2016 11:54   Ct Head Wo Contrast  Result Date: 07/08/2016 CLINICAL DATA:  82 year old male found down at home this morning. Initial encounter. EXAM: CT HEAD WITHOUT CONTRAST CT CERVICAL SPINE WITHOUT CONTRAST TECHNIQUE: Multidetector CT imaging of the head and cervical spine was performed following the standard protocol without intravenous contrast. Multiplanar CT image reconstructions of the cervical spine were also generated. COMPARISON:  Brain MRI 06/12/2015. Head and cervical spine CT 05/28/2013 FINDINGS: CT HEAD FINDINGS Brain: Stable gray-white matter differentiation throughout the brain. No midline shift, ventriculomegaly, mass effect, evidence of mass lesion, intracranial hemorrhage or evidence of cortically based acute infarction. No cortical encephalomalacia identified. Vascular: Calcified atherosclerosis at the skull base. Skull: No acute osseous abnormality identified. Sinuses/Orbits: Visualized paranasal sinuses and mastoids are stable and well pneumatized. Other: No scalp hematoma identified. Stable orbits soft tissues aside from interval postoperative changes to the left globe. CT CERVICAL SPINE FINDINGS Alignment: Chronic straightening of cervical lordosis with mild anterolisthesis of C2 on C3. Cervicothoracic junction alignment is within normal limits. Bilateral posterior element alignment appears stable. Skull base and vertebrae: Visualized skull base is intact. No atlanto-occipital dissociation. 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  Spina bifida occulta at C7. No acute cervical spine fracture identified. Soft tissues and spinal canal: No prevertebral fluid or swelling. No visible canal hematoma. Retropharyngeal course of both carotid arteries. Calcified carotid atherosclerosis Disc levels: Severe diffuse cervical disc space loss and bulky endplate spurring, relatively sparing the C2-C3 level. Chronic degenerative cervical spinal stenosis appears most pronounced at C3-C4, moderate. Upper chest: Visible upper thoracic levels appear intact. Negative lung apices. IMPRESSION: 1. No acute intracranial abnormality. Stable non contrast CT appearance of the brain. 2. Chronic severe cervical spine degeneration. No acute fracture or listhesis in  the cervical spine. Electronically Signed   By: Odessa Fleming M.D.   On: 07/08/2016 13:03   Ct Cervical Spine Wo Contrast  Result Date: 07/08/2016 CLINICAL DATA:  82 year old male found down at home this morning. Initial encounter. EXAM: CT HEAD WITHOUT CONTRAST CT CERVICAL SPINE WITHOUT CONTRAST TECHNIQUE: Multidetector CT imaging of the head and cervical spine was performed following the standard protocol without intravenous contrast. Multiplanar CT image reconstructions of the cervical spine were also generated. COMPARISON:  Brain MRI 06/12/2015. Head and cervical spine CT 05/28/2013 FINDINGS: CT HEAD FINDINGS Brain: Stable gray-white matter differentiation throughout the brain. No midline shift, ventriculomegaly, mass effect, evidence of mass lesion, intracranial hemorrhage or evidence of cortically based acute infarction. No cortical encephalomalacia identified. Vascular: Calcified atherosclerosis at the skull base. Skull: No acute osseous abnormality identified. Sinuses/Orbits: Visualized paranasal sinuses and mastoids are stable and well pneumatized. Other: No scalp hematoma identified. Stable orbits soft tissues aside from interval postoperative changes to the left globe. CT CERVICAL SPINE FINDINGS  Alignment: Chronic straightening of cervical lordosis with mild anterolisthesis of C2 on C3. Cervicothoracic junction alignment is within normal limits. Bilateral posterior element alignment appears stable. Skull base and vertebrae: Visualized skull base is intact. No atlanto-occipital dissociation. 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 Spina bifida occulta at C7. No acute cervical spine fracture identified. Soft tissues and spinal canal: No prevertebral fluid or swelling. No visible canal hematoma. Retropharyngeal course of both carotid arteries. Calcified carotid atherosclerosis Disc levels: Severe diffuse cervical disc space loss and bulky endplate spurring, relatively sparing the C2-C3 level. Chronic degenerative cervical spinal stenosis appears most pronounced at C3-C4, moderate. Upper chest: Visible upper thoracic levels appear intact. Negative lung apices. IMPRESSION: 1. No acute intracranial abnormality. Stable non contrast CT appearance of the brain. 2. Chronic severe cervical spine degeneration. No acute fracture or listhesis in the cervical spine. Electronically Signed   By: Odessa Fleming M.D.   On: 07/08/2016 13:03   Mr Knee Right Wo Contrast  Result Date: 07/08/2016 CLINICAL DATA:  Right knee pain after fall. EXAM: MRI OF THE RIGHT KNEE WITHOUT CONTRAST TECHNIQUE: Multiplanar, multisequence MR imaging of the knee was performed. No intravenous contrast was administered. COMPARISON:  None. FINDINGS: MENISCI Medial meniscus: Complex predominantly horizontal tear of the posterior horn of the medial meniscus surfacing anteriorly in the white zone and inferiorly in the red zone, series 8, images 22 and 23. Lateral meniscus:  Intact LIGAMENTS Cruciates:  Intact Collaterals: Thickening of the lateral collateral ligament with overlying soft tissue edema suspicious for LCL sprain. Intact medial collateral ligament complex. CARTILAGE Patellofemoral:  Chondromalacia of the lateral patellar cartilage.  Medial: Irregular areas of chondral thinning with tiny 3 mm (transverse) full-thickness chondral defect along the weight-bearing portion of the medial femoral condylar cartilage, series 7, image 14. Lateral:  No chondral defect. Joint:  Moderate suprapatellar joint effusion. Popliteal Fossa: Small complex popliteal cyst measuring up to 2.5 cm in AP dimension intact popliteus. Extensor Mechanism:  Intact quadriceps tendon and patellar tendon. Bones: Mild bone contusion of the posteromedial tibial epiphysis. Tiny degenerative subchondral cyst deep to the medial trochlear notch. Other: None. IMPRESSION: 1. Complex tear of the posterior horn of the medial meniscus with dominant horizontal component surfacing superiorly in the white zone and inferiorly in the red zone. 2. Thickened appearance of the LCL with overlying soft tissue edema consistent with a grade 2 sprain. 3. Moderate suprapatellar joint effusion with small popliteal cyst. 4. Minimal bone contusion of the posteromedial  tibial epiphysis. 5. Chondromalacia lateral patellar cartilage with minimally irregular cartilage thinning along the medial femoral condylar cartilage. Electronically Signed   By: Tollie Ethavid  Kwon M.D.   On: 07/08/2016 18:27   Dg Knee Complete 4 Views Right  Result Date: 07/08/2016 CLINICAL DATA:  Larey SeatFell yesterday with right knee pain EXAM: RIGHT KNEE - COMPLETE 4+ VIEW COMPARISON:  Right knee films of 01/09/2013 FINDINGS: There has been slight interval progression of tricompartmental degenerative joint disease the right knee. Slight loss of medial compartment and patellofemoral compartment joint spaces is noted with sclerosis and spurring. No fracture is seen. There may be a small amount of joint fluid present. As noted previously there is faint chondrocalcinosis present which could indicate CPPD arthropathy. Arterial calcifications are present. IMPRESSION: 1. Interval progression of tricompartmental degenerative joint disease of the right knee  primarily involving the medial and patellofemoral compartments. 2. Faint chondrocalcinosis may indicate CPPD arthropathy. Electronically Signed   By: Dwyane DeePaul  Barry M.D.   On: 07/08/2016 11:53    (Echo, Carotid, EGD, Colonoscopy, ERCP)    Subjective: Pt says he feels better.  He has been ambulating with rolling walker.    Discharge Exam: Vitals:   07/09/16 0451 07/09/16 1300  BP: (!) 143/49 (!) 160/81  Pulse: 65 67  Resp: 20 20  Temp: 97.5 F (36.4 C) 97.7 F (36.5 C)   Vitals:   07/08/16 1849 07/08/16 2025 07/09/16 0451 07/09/16 1300  BP: 102/89 133/66 (!) 143/49 (!) 160/81  Pulse: 75 74 65 67  Resp: 18 18 20 20   Temp: 97.8 F (36.6 C) 98.2 F (36.8 C) 97.5 F (36.4 C) 97.7 F (36.5 C)  TempSrc: Oral Oral Oral Oral  SpO2: 99% 99% 100% 100%  Weight: 91.6 kg (202 lb)     Height: 5\' 9"  (1.753 m)       General exam: awake, alert, NAD. Cooperative.  Respiratory system: Clear. No increased work of breathing. Cardiovascular system: S1 & S2 heard, RRR. No JVD, murmurs, gallops, clicks or pedal edema. Gastrointestinal system: Abdomen is nondistended, soft and nontender. Normal bowel sounds heard. Central nervous system: Alert and oriented. No focal neurological deficits. Extremities: swollen and tender right knee with crepitus of right patella.   The results of significant diagnostics from this hospitalization (including imaging, microbiology, ancillary and laboratory) are listed below for reference.     Microbiology: No results found for this or any previous visit (from the past 240 hour(s)).   Labs: BNP (last 3 results) No results for input(s): BNP in the last 8760 hours. Basic Metabolic Panel:  Recent Labs Lab 07/08/16 1100  NA 139  K 4.3  CL 105  CO2 25  GLUCOSE 63*  BUN 14  CREATININE 1.14  CALCIUM 9.5   Liver Function Tests:  Recent Labs Lab 07/08/16 1100  AST 27  ALT 25  ALKPHOS 99  BILITOT 0.6  PROT 7.8  ALBUMIN 3.9   No results for  input(s): LIPASE, AMYLASE in the last 168 hours. No results for input(s): AMMONIA in the last 168 hours. CBC:  Recent Labs Lab 07/08/16 1100  WBC 12.0*  NEUTROABS 8.6*  HGB 14.5  HCT 43.3  MCV 83.1  PLT 198   Cardiac Enzymes:  Recent Labs Lab 07/08/16 1534  CKTOTAL 346   BNP: Invalid input(s): POCBNP CBG:  Recent Labs Lab 07/09/16 0409 07/09/16 0425 07/09/16 0619 07/09/16 1118 07/09/16 1525  GLUCAP 48* 186* 162* 101* 114*   D-Dimer No results for input(s): DDIMER in the last 72  hours. Hgb A1c  Recent Labs  07/08/16 1100  HGBA1C 8.0*   Lipid Profile No results for input(s): CHOL, HDL, LDLCALC, TRIG, CHOLHDL, LDLDIRECT in the last 72 hours. Thyroid function studies No results for input(s): TSH, T4TOTAL, T3FREE, THYROIDAB in the last 72 hours.  Invalid input(s): FREET3 Anemia work up No results for input(s): VITAMINB12, FOLATE, FERRITIN, TIBC, IRON, RETICCTPCT in the last 72 hours. Urinalysis    Component Value Date/Time   COLORURINE YELLOW 07/08/2016 1338   APPEARANCEUR CLEAR 07/08/2016 1338   LABSPEC 1.016 07/08/2016 1338   PHURINE 5.0 07/08/2016 1338   GLUCOSEU NEGATIVE 07/08/2016 1338   HGBUR NEGATIVE 07/08/2016 1338   BILIRUBINUR NEGATIVE 07/08/2016 1338   BILIRUBINUR n 03/26/2015 1025   KETONESUR NEGATIVE 07/08/2016 1338   PROTEINUR NEGATIVE 07/08/2016 1338   UROBILINOGEN 0.2 03/26/2015 1025   UROBILINOGEN 0.2 05/29/2013 0216   NITRITE NEGATIVE 07/08/2016 1338   LEUKOCYTESUR NEGATIVE 07/08/2016 1338   Sepsis Labs Invalid input(s): PROCALCITONIN,  WBC,  LACTICIDVEN Microbiology No results found for this or any previous visit (from the past 240 hour(s)).  Time coordinating discharge: 31 minutes  SIGNED:  Standley Dakins, MD  [facility] 07/09/2016, 3:49 PM Pager   If 7PM-7AM, please contact night-coverage [email or web address] Password [email or web address]

## 2016-07-09 NOTE — Care Management Note (Signed)
Case Management Note   Subjective/Objective:    82 yr old gentleman admitted with knee pain, s/p fall at home.                 Action/Plan: Case manager contacted patient's wife via telephone to discuss need for Home Health and DME. Choice for Home Health agency was offered. Referral was called to [facility] Liaison, Adacia Wade-Shamberger. CM has requested RW and 3in1.    Expected Discharge Date:  07/09/16               Expected Discharge Plan:   Home with Home Health  In-House Referral:     Discharge planning Services  CM Consult  Post Acute Care Choice:  Home Health, Durable Medical Equipment Choice offered to:  Patient, Spouse  DME Arranged:  3-N-1, Walker rolling DME Agency:  [facility]  [facility]  Status of Service:  Completed, signed off  If discussed at Long Length of Stay Meetings, dates discussed:    Additional Comments:  Durenda GuthrieBrady, Jaedan Huttner Naomi, RN 07/09/2016, 3:59 PM

## 2016-07-09 NOTE — Evaluation (Signed)
Physical Therapy Evaluation Patient Details Name: William Aguirre MRN: [id number] DOB: 08-Apr-1935 Today's Date: 07/09/2016   History of Present Illness  Pt is a 82 yo male admitted through ED following 2 falls over the past two days. Most recent fall resulted in right medical meniscus tear. PMH significant for DM, HLD, HTN, hypoglycemia, dementia.   Clinical Impression  Pt is presents with the above diagnosis and below deficits for therapy evaluation. Prior to admission, pt was living with his wife and mobilizing with a [place] and driving himself. Pt presents with some difficulty following directions and problem solving and requires min guard assist for mobility this session. Pt has increased pain in right knee and is awaiting an Ortho consult for current diagnosis. Pt will benefit from HHPT at discharge in order to assist with improving strength and mobility and continues to require acute PT services to maximize his safety with returning home.     Follow Up Recommendations Home health PT;Supervision for mobility/OOB    Equipment Recommendations  Rolling walker with 5" wheels;3in1 (PT)    Recommendations for Other Services       Precautions / Restrictions Precautions Precautions: Fall Precaution Comments: s/p fall 3/27/ and 3/28 Restrictions Weight Bearing Restrictions: No      Mobility  Bed Mobility Overal bed mobility: Needs Assistance Bed Mobility: Supine to Sit     Supine to sit: HOB elevated;Min guard     General bed mobility comments: Min guard for safety to get to EOB  Transfers Overall transfer level: Needs assistance Equipment used: Rolling walker (2 wheeled) Transfers: Sit to/from Stand Sit to Stand: Min guard         General transfer comment: Min guard for safety from EOB. Pt begins to urinate upon standing and has to maneuver around a towel. Pt is somewhat impulsive and attempts to get up and move prior to having assistance.   Ambulation/Gait Ambulation/Gait  assistance: Min guard Ambulation Distance (Feet): 50 Feet Assistive device: Rolling walker (2 wheeled) Gait Pattern/deviations: Step-to pattern;Decreased step length - left;Decreased stance time - right;Antalgic Gait velocity: decreased Gait velocity interpretation: Below normal speed for age/gender General Gait Details: Moderate antalgic gait with cues for sequencing and staying within walker during gait. Cues for environmental awareness   Stairs            Wheelchair Mobility    Modified Rankin (Stroke Patients Only)       Balance Overall balance assessment: Needs assistance Sitting-balance support: No upper extremity supported;Feet supported Sitting balance-Leahy Scale: Fair     Standing balance support: Bilateral upper extremity supported;During functional activity Standing balance-Leahy Scale: Poor Standing balance comment: Relies on RW for stability in standing                             Pertinent Vitals/Pain Pain Assessment: 0-10 Pain Score: 3  Pain Location: right knee Pain Descriptors / Indicators: Aching;Grimacing;Guarding;Sore Pain Intervention(s): Monitored during session;Repositioned;Ice applied    Home Living Family/patient expects to be discharged to:: Private residence Living Arrangements: Spouse/significant other Available Help at Discharge: Family;Available PRN/intermittently Type of Home: House Home Access: Stairs to enter Entrance Stairs-Rails: Right;Left;Can reach both Entrance Stairs-Number of Steps: 4-5 Home Layout: One level Home Equipment: Cane - single point;Shower seat;Grab bars - tub/shower Additional Comments: all gathered through patient. need to clarify with wife    Prior Function Level of Independence: Independent with assistive device(s)         Comments:  used cane prior to coming into hosital     Hand Dominance   Dominant Hand: Right    Extremity/Trunk Assessment   Upper Extremity Assessment Upper  Extremity Assessment: Overall WFL for tasks assessed    Lower Extremity Assessment Lower Extremity Assessment: RLE deficits/detail RLE Deficits / Details: Unable to perform formal MMT, but at least 3/5 grossly RLE limited by pain RLE: Unable to fully assess due to pain    Cervical / Trunk Assessment Cervical / Trunk Assessment: Normal  Communication   Communication: No difficulties  Cognition Arousal/Alertness: Awake/alert Behavior During Therapy: WFL for tasks assessed/performed;Flat affect Overall Cognitive Status: No family/caregiver present to determine baseline cognitive functioning                                 General Comments: pt is oriented to person, place, and situation. h/o dementia, unsure of PLOF. Pt is impulsive       General Comments      Exercises     Assessment/Plan    PT Assessment Patient needs continued PT services  PT Problem List Decreased strength;Decreased range of motion;Decreased activity tolerance;Decreased balance;Decreased mobility;Decreased knowledge of use of DME;Pain       PT Treatment Interventions DME instruction;Gait training;Stair training;Functional mobility training;Therapeutic activities;Therapeutic exercise;Balance training;Patient/family education    PT Goals (Current goals can be found in the Care Plan section)  Acute Rehab PT Goals Patient Stated Goal: none stated PT Goal Formulation: With patient Time For Goal Achievement: 07/16/16 Potential to Achieve Goals: Good    Frequency Min 5X/week   Barriers to discharge        Co-evaluation               End of Session Equipment Utilized During Treatment: Gait belt Activity Tolerance: Patient tolerated treatment well Patient left: in chair;with call bell/phone within reach;with chair alarm set Nurse Communication: Mobility status PT Visit Diagnosis: Pain;Difficulty in walking, not elsewhere classified (R26.2) Pain - Right/Left: Right Pain - part of  body: Knee    Time: 1107-1140 PT Time Calculation (min) (ACUTE ONLY): 33 min   Charges:   PT Evaluation $PT Eval Low Complexity: 1 Procedure PT Treatments $Gait Training: 8-22 mins   PT G Codes:   PT G-Codes **NOT FOR INPATIENT CLASS** Functional Assessment Tool Used: AM-PAC 6 Clicks Basic Mobility;Clinical judgement Functional Limitation: Mobility: Walking and moving around Mobility: Walking and Moving Around Current Status ([id number](G8978): At least 20 percent but less than 40 percent impaired, limited or restricted Mobility: Walking and Moving Around Goal Status [phone]([id number]): 0 percent impaired, limited or restricted    Colin BroachSabra M. Adleigh Mcmasters PT, DPT  [phone]   Roxy MannsSabra Marie Ghadeer Kastelic 07/09/2016, 12:42 PM

## 2016-07-09 NOTE — Care Management Obs Status (Signed)
MEDICARE OBSERVATION STATUS NOTIFICATION   Patient Details  Name: William EvertsHenry S Biller MRN: [id number] Date of Birth: 1935-01-14   Medicare Observation Status Notification Given:  Yes    Durenda GuthrieBrady, Syd Newsome Naomi, RN 07/09/2016, 4:28 PM

## 2016-07-09 NOTE — Progress Notes (Signed)
qPhysical Therapy Treatment Patient Details Name: William Aguirre MRN: [id number] DOB: 1935/01/21 Today's Date: 07/09/2016    History of Present Illness Pt is a 82 yo male admitted through ED following 2 falls over the past two days. Most recent fall resulted in right medical meniscus tear. PMH significant for DM, HLD, HTN, hypoglycemia, dementia.     PT Comments    PT called back in because wife now present and wants to see her husband move so she can know if she can handle him at discharge.  We ambulated and practiced stairs simulating home entry.  Wife seems comfortable and was helping to get him dressed during session.  Home services in place and equipment already delivered to room.  Pt due to d/c home this evening.    Follow Up Recommendations  Home health PT;Supervision for mobility/OOB     Equipment Recommendations  Rolling walker with 5" wheels;3in1 (PT)    Recommendations for Other Services   NA     Precautions / Restrictions Precautions Precautions: Fall Precaution Comments: s/p fall 3/27/ and 3/28 Restrictions Weight Bearing Restrictions: No    Mobility  Bed Mobility               General bed mobility comments: Pt is OOB in recliner chair.   Transfers Overall transfer level: Needs assistance Equipment used: Rolling walker (2 wheeled) Transfers: Sit to/from Stand Sit to Stand: Min assist         General transfer comment: Min assist from low recliner chair to support trunk to  power up and for balance during transition of hands. Verbal cues to scoot out first before standing and for safe hand placement.   Ambulation/Gait Ambulation/Gait assistance: Min assist Ambulation Distance (Feet): 100 Feet Assistive device: Rolling walker (2 wheeled) Gait Pattern/deviations: Decreased step length - left;Decreased stance time - right;Antalgic;Step-through pattern Gait velocity: decreased Gait velocity interpretation: Below normal speed for age/gender General  Gait Details: Mildly antalgic gait pattern, increased pain with turning (due to pivoting on his right knee), verbal cues to stay inside of RW and for safe proximity, upright posture, and assist needed at his trunk for balance and to help steer RW.    Stairs Stairs: Yes   Stair Management: One rail Right;Step to pattern;Forwards (hand held assist left) Number of Stairs: 3 General stair comments: Verbal cues for correct/safest LE sequencing, wife able to verbalize correctly.  Min assist for balance provided at free hand.       Balance Overall balance assessment: Needs assistance Sitting-balance support: No upper extremity supported;Feet supported Sitting balance-Leahy Scale: Fair Sitting balance - Comments: tends to fatigue quickly and lean back Postural control: Posterior lean Standing balance support: Bilateral upper extremity supported;During functional activity Standing balance-Leahy Scale: Poor Standing balance comment: Relies on RW for stability in standing                            Cognition Arousal/Alertness: Awake/alert Behavior During Therapy: WFL for tasks assessed/performed Overall Cognitive Status: History of cognitive impairments - at baseline                                 General Comments: knows his wife, pleasantly demented             Pertinent Vitals/Pain Pain Assessment: Faces Faces Pain Scale: Hurts little more Pain Location: right knee Pain Descriptors / Indicators: Aching;Grimacing;Guarding;Sore Pain  Intervention(s): Limited activity within patient's tolerance;Monitored during session;Repositioned           PT Goals (current goals can now be found in the care plan section) Acute Rehab PT Goals Patient Stated Goal: wife wants to make sure he is safe to go home    Frequency    Min 5X/week      PT Plan Current plan remains appropriate       End of Session   Activity Tolerance: Patient limited by pain Patient  left: in chair;with call bell/phone within reach;with family/visitor present Nurse Communication: Mobility status PT Visit Diagnosis: Pain;Difficulty in walking, not elsewhere classified (R26.2) Pain - Right/Left: Right Pain - part of body: Knee     Time: 4098-1191 PT Time Calculation (min) (ACUTE ONLY): 15 min  Charges:  $Gait Training: 8-22 mins                    G Codes:  Functional Assessment Tool Used: AM-PAC 6 Clicks Basic Mobility;Clinical judgement Functional Limitation: Mobility: Walking and moving around Mobility: Walking and Moving Around Goal Status [phone]): 0 percent impaired, limited or restricted Mobility: Walking and Moving Around Discharge Status [phone]): At least 20 percent but less than 40 percent impaired, limited or restricted       [name] [name], PT, DPT [phone]   07/09/2016, 5:35 PM

## 2016-07-09 NOTE — Progress Notes (Signed)
[facility] with [facility] Direct[facility].  Patient has no idea  Regarding AD, but indicated that maybe his wife had requested it on his behalf.  Patient already had document in room but incomplete.  I advised patient if he needed chaplain services when wife arrives have nurse page us. Venida JarvisWatlington, Ramonte Mena, Chaplain,pager [phone]

## 2016-07-09 NOTE — Discharge Instructions (Signed)
Please monitor blood sugars 4 times per day Reduce lantus dose to 25 units once daily Rest and Ice the right knee Follow up with orthopedics in 1 week Fall precautions at home Record your blood sugars and report to your doctor      Acute Pain, Adult Acute pain is a type of pain that may last for just a few days or as long as six months. It is often related to an illness, injury, or medical procedure. Acute pain may be mild, moderate, or severe. It usually goes away once your injury has healed or you are no longer ill. Pain can make it hard for you to do daily activities. It can cause anxiety and lead to other problems if left untreated. Treatment depends on the cause and severity of your acute pain. Follow these instructions at home:  Check your pain level as told by your health care provider.  Take over-the-counter and prescription medicines only as told by your health care provider.  If you are taking prescription pain medicine:  Ask your health care provider about taking a stool softener or laxative to prevent constipation.  Do not stop taking the medicine suddenly. Talk to your health care provider about how and when to discontinue prescription pain medicine.  If your pain is severe, do not take more pills than instructed by your health care provider.  Do not take other over-the-counter pain medicines in addition to this medicine unless told by your health care provider.  Do not drive or operate heavy machinery while taking prescription pain medicine.  Apply ice or heat as told by your health care provider. These may reduce swelling and pain.  Ask your health care provider if other strategies such as distraction, relaxation, or physical therapies can help your pain.  Keep all follow-up visits as told by your health care provider. This is important. Contact a health care provider if:  You have pain that is not controlled by medicine.  Your pain does not improve or gets  worse.  You have side effects from pain medicines, such as vomitingor confusion. Get help right away if:  You have severe pain.  You have trouble breathing.  You lose consciousness.  You have chest pain or pressure that lasts for more than a few minutes. Along with the chest pain you may:  Have pain or discomfort in one or both arms, your back, neck, jaw, or stomach.  Have shortness of breath.  Break out in a cold sweat.  Feel nauseous.  Become light-headed. These symptoms may represent a serious problem that is an emergency. Do not wait to see if the symptoms will go away. Get medical help right away. Call your local emergency services (911 in the [place] Do not drive yourself to the hospital. This information is not intended to replace advice given to you by your health care provider. Make sure you discuss any questions you have with your health care provider. Document Released: 04/14/2015 Document Revised: 09/06/2015 Document Reviewed: 04/14/2015 Elsevier Interactive Patient Education  2017 [employer]  Blood Glucose Monitoring, Adult Monitoring your blood sugar (glucose) helps you manage your diabetes. It also helps you and your health care provider determine how well your diabetes management plan is working. Blood glucose monitoring involves checking your blood glucose as often as directed, and keeping a record (log) of your results over time. Why should I monitor my blood glucose? Checking your blood glucose regularly can:  Help you understand how food, exercise, illnesses, and  medicines affect your blood glucose.  Let you know what your blood glucose is at any time. You can quickly tell if you are having low blood glucose (hypoglycemia) or high blood glucose (hyperglycemia).  Help you and your health care provider adjust your medicines as needed. When should I check my blood glucose? Follow instructions from your health care provider about how often to check your blood  glucose. This may depend on:  The type of diabetes you have.  How well-controlled your diabetes is.  Medicines you are taking. If you have type 1 diabetes:   Check your blood glucose at least 2 times a day.  Also check your blood glucose:  Before every insulin injection.  Before and after exercise.  Between meals.  2 hours after a meal.  Occasionally between 2:00 a.m. and 3:00 a.m., as directed.  Before potentially dangerous tasks, like driving or using heavy machinery.  At bedtime.  You may need to check your blood glucose more often, up to 6-10 times a day:  If you use an insulin pump.  If you need multiple daily injections (MDI).  If your diabetes is not well-controlled.  If you are ill.  If you have a history of severe hypoglycemia.  If you have a history of not knowing when your blood glucose is getting low (hypoglycemia unawareness). If you have type 2 diabetes:   If you take insulin or other diabetes medicines, check your blood glucose at least 2 times a day.  If you are on intensive insulin therapy, check your blood glucose at least 4 times a day. Occasionally, you may also need to check between 2:00 a.m. and 3:00 a.m., as directed.  Also check your blood glucose:  Before and after exercise.  Before potentially dangerous tasks, like driving or using heavy machinery.  You may need to check your blood glucose more often if:  Your medicine is being adjusted.  Your diabetes is not well-controlled.  You are ill. What is a blood glucose log?  A blood glucose log is a record of your blood glucose readings. It helps you and your health care provider:  Look for patterns in your blood glucose over time.  Adjust your diabetes management plan as needed.  Every time you check your blood glucose, write down your result and notes about things that may be affecting your blood glucose, such as your diet and exercise for the day.  Most glucose meters store a  record of glucose readings in the meter. Some meters allow you to download your records to a computer. How do I check my blood glucose? Follow these steps to get accurate readings of your blood glucose: Supplies needed    Blood glucose meter.  Test strips for your meter. Each meter has its own strips. You must use the strips that come with your meter.  A needle to prick your finger (lancet). Do not use lancets more than once.  A device that holds the lancet (lancing device).  A journal or log book to write down your results. Procedure   Wash your hands with soap and water.  Prick the side of your finger (not the tip) with the lancet. Use a different finger each time.  Gently rub the finger until a small drop of blood appears.  Follow instructions that come with your meter for inserting the test strip, applying blood to the strip, and using your blood glucose meter.  Write down your result and any notes. Alternative testing sites  Some meters allow you to use areas of your body other than your finger (alternative sites) to test your blood.  If you think you may have hypoglycemia, or if you have hypoglycemia unawareness, do not use alternative sites. Use your finger instead.  Alternative sites may not be as accurate as the fingers, because blood flow is slower in these areas. This means that the result you get may be delayed, and it may be different from the result that you would get from your finger.  The most common alternative sites are:  Forearm.  Thigh.  Palm of the hand. Additional tips   Always keep your supplies with you.  If you have questions or need help, all blood glucose meters have a 24-hour hotline number that you can call. You may also contact your health care provider.  After you use a few boxes of test strips, adjust (calibrate) your blood glucose meter by following instructions that came with your meter. This information is not intended to replace  advice given to you by your health care provider. Make sure you discuss any questions you have with your health care provider. Document Released: 04/02/2003 Document Revised: 10/18/2015 Document Reviewed: 09/09/2015 Elsevier Interactive Patient Education  2017 [employer].

## 2016-07-11 DIAGNOSIS — I10 Essential (primary) hypertension: Secondary | ICD-10-CM | POA: Diagnosis not present

## 2016-07-11 DIAGNOSIS — F0390 Unspecified dementia without behavioral disturbance: Secondary | ICD-10-CM | POA: Diagnosis not present

## 2016-07-11 DIAGNOSIS — I2510 Atherosclerotic heart disease of native coronary artery without angina pectoris: Secondary | ICD-10-CM | POA: Diagnosis not present

## 2016-07-11 DIAGNOSIS — S83241D Other tear of medial meniscus, current injury, right knee, subsequent encounter: Secondary | ICD-10-CM | POA: Diagnosis not present

## 2016-07-11 DIAGNOSIS — E109 Type 1 diabetes mellitus without complications: Secondary | ICD-10-CM | POA: Diagnosis not present

## 2016-07-11 DIAGNOSIS — Z7982 Long term (current) use of aspirin: Secondary | ICD-10-CM | POA: Diagnosis not present

## 2016-07-11 DIAGNOSIS — Z7902 Long term (current) use of antithrombotics/antiplatelets: Secondary | ICD-10-CM | POA: Diagnosis not present

## 2016-07-11 DIAGNOSIS — R32 Unspecified urinary incontinence: Secondary | ICD-10-CM | POA: Diagnosis not present

## 2016-07-11 DIAGNOSIS — E785 Hyperlipidemia, unspecified: Secondary | ICD-10-CM | POA: Diagnosis not present

## 2016-07-14 DIAGNOSIS — M25561 Pain in right knee: Secondary | ICD-10-CM | POA: Diagnosis not present

## 2016-07-14 DIAGNOSIS — Z7982 Long term (current) use of aspirin: Secondary | ICD-10-CM | POA: Diagnosis not present

## 2016-07-14 DIAGNOSIS — E109 Type 1 diabetes mellitus without complications: Secondary | ICD-10-CM | POA: Diagnosis not present

## 2016-07-14 DIAGNOSIS — S83241D Other tear of medial meniscus, current injury, right knee, subsequent encounter: Secondary | ICD-10-CM | POA: Diagnosis not present

## 2016-07-14 DIAGNOSIS — Z7902 Long term (current) use of antithrombotics/antiplatelets: Secondary | ICD-10-CM | POA: Diagnosis not present

## 2016-07-14 DIAGNOSIS — I10 Essential (primary) hypertension: Secondary | ICD-10-CM | POA: Diagnosis not present

## 2016-07-14 DIAGNOSIS — F0390 Unspecified dementia without behavioral disturbance: Secondary | ICD-10-CM | POA: Diagnosis not present

## 2016-07-14 DIAGNOSIS — E785 Hyperlipidemia, unspecified: Secondary | ICD-10-CM | POA: Diagnosis not present

## 2016-07-14 DIAGNOSIS — R32 Unspecified urinary incontinence: Secondary | ICD-10-CM | POA: Diagnosis not present

## 2016-07-14 DIAGNOSIS — I2510 Atherosclerotic heart disease of native coronary artery without angina pectoris: Secondary | ICD-10-CM | POA: Diagnosis not present

## 2016-07-15 DIAGNOSIS — E785 Hyperlipidemia, unspecified: Secondary | ICD-10-CM | POA: Diagnosis not present

## 2016-07-15 DIAGNOSIS — E109 Type 1 diabetes mellitus without complications: Secondary | ICD-10-CM | POA: Diagnosis not present

## 2016-07-15 DIAGNOSIS — I10 Essential (primary) hypertension: Secondary | ICD-10-CM | POA: Diagnosis not present

## 2016-07-15 DIAGNOSIS — S83241D Other tear of medial meniscus, current injury, right knee, subsequent encounter: Secondary | ICD-10-CM | POA: Diagnosis not present

## 2016-07-15 DIAGNOSIS — I2510 Atherosclerotic heart disease of native coronary artery without angina pectoris: Secondary | ICD-10-CM | POA: Diagnosis not present

## 2016-07-15 DIAGNOSIS — Z7982 Long term (current) use of aspirin: Secondary | ICD-10-CM | POA: Diagnosis not present

## 2016-07-15 DIAGNOSIS — R32 Unspecified urinary incontinence: Secondary | ICD-10-CM | POA: Diagnosis not present

## 2016-07-15 DIAGNOSIS — Z7902 Long term (current) use of antithrombotics/antiplatelets: Secondary | ICD-10-CM | POA: Diagnosis not present

## 2016-07-15 DIAGNOSIS — F0390 Unspecified dementia without behavioral disturbance: Secondary | ICD-10-CM | POA: Diagnosis not present

## 2016-07-16 ENCOUNTER — Ambulatory Visit (INDEPENDENT_AMBULATORY_CARE_PROVIDER_SITE_OTHER): Payer: Medicare HMO | Admitting: Adult Health

## 2016-07-16 ENCOUNTER — Telehealth: Payer: Self-pay | Admitting: Adult Health

## 2016-07-16 ENCOUNTER — Encounter: Payer: Self-pay | Admitting: Adult Health

## 2016-07-16 VITALS — BP 124/70 | Temp 97.6°F | Wt 202.4 lb

## 2016-07-16 DIAGNOSIS — S83241S Other tear of medial meniscus, current injury, right knee, sequela: Secondary | ICD-10-CM

## 2016-07-16 DIAGNOSIS — F0390 Unspecified dementia without behavioral disturbance: Secondary | ICD-10-CM | POA: Diagnosis not present

## 2016-07-16 DIAGNOSIS — E119 Type 2 diabetes mellitus without complications: Secondary | ICD-10-CM

## 2016-07-16 DIAGNOSIS — S83241D Other tear of medial meniscus, current injury, right knee, subsequent encounter: Secondary | ICD-10-CM | POA: Diagnosis not present

## 2016-07-16 DIAGNOSIS — E162 Hypoglycemia, unspecified: Secondary | ICD-10-CM | POA: Diagnosis not present

## 2016-07-16 DIAGNOSIS — E109 Type 1 diabetes mellitus without complications: Secondary | ICD-10-CM | POA: Diagnosis not present

## 2016-07-16 DIAGNOSIS — Z7902 Long term (current) use of antithrombotics/antiplatelets: Secondary | ICD-10-CM | POA: Diagnosis not present

## 2016-07-16 DIAGNOSIS — E785 Hyperlipidemia, unspecified: Secondary | ICD-10-CM | POA: Diagnosis not present

## 2016-07-16 DIAGNOSIS — I2510 Atherosclerotic heart disease of native coronary artery without angina pectoris: Secondary | ICD-10-CM | POA: Diagnosis not present

## 2016-07-16 DIAGNOSIS — I10 Essential (primary) hypertension: Secondary | ICD-10-CM | POA: Diagnosis not present

## 2016-07-16 DIAGNOSIS — Z7982 Long term (current) use of aspirin: Secondary | ICD-10-CM | POA: Diagnosis not present

## 2016-07-16 DIAGNOSIS — R32 Unspecified urinary incontinence: Secondary | ICD-10-CM | POA: Diagnosis not present

## 2016-07-16 LAB — BASIC METABOLIC PANEL
BUN: 16 mg/dL (ref 6–23)
CHLORIDE: 102 meq/L (ref 96–112)
CO2: 29 mEq/L (ref 19–32)
CREATININE: 1.17 mg/dL (ref 0.40–1.50)
Calcium: 9.7 mg/dL (ref 8.4–10.5)
GFR: 77.14 mL/min (ref >60.00)
Glucose, Bld: 127 mg/dL — ABNORMAL HIGH (ref 70–99)
Potassium: 4.7 mEq/L (ref 3.5–5.1)
Sodium: 138 mEq/L (ref 135–145)

## 2016-07-16 LAB — CBC WITH DIFFERENTIAL/PLATELET
BASOS PCT: 0.6 % (ref 0.0–3.0)
Basophils Absolute: 0.1 10*3/uL (ref 0.0–0.1)
EOS PCT: 1.2 % (ref 0.0–5.0)
Eosinophils Absolute: 0.1 10*3/uL (ref 0.0–0.7)
HEMATOCRIT: 46.8 % (ref 39.0–52.0)
Hemoglobin: 15.3 g/dL (ref 13.0–17.0)
LYMPHS PCT: 39.7 % (ref 12.0–46.0)
Lymphs Abs: 3.7 10*3/uL (ref 0.7–4.0)
MCHC: 32.7 g/dL (ref 30.0–36.0)
MCV: 83.4 fl (ref 78.0–100.0)
MONOS PCT: 10.8 % (ref 3.0–12.0)
Monocytes Absolute: 1 10*3/uL (ref 0.1–1.0)
Neutro Abs: 4.4 10*3/uL (ref 1.4–7.7)
Neutrophils Relative %: 47.7 % (ref 43.0–77.0)
PLATELETS: 189 10*3/uL (ref 150.0–400.0)
RBC: 5.61 Mil/uL (ref 4.22–5.81)
RDW: 14.4 % (ref 11.5–15.5)
WBC: 9.3 10*3/uL (ref 4.0–10.5)

## 2016-07-16 NOTE — Telephone Encounter (Signed)
Opened in error

## 2016-07-16 NOTE — Progress Notes (Signed)
Subjective:    Patient ID: William Aguirre, male    DOB: 04-27-1934, 82 y.o.   MRN: [id number]  HPI  82 year old male who  has a past medical history of Coronary artery disease; Dementia; Diabetes mellitus without complication (HCC); Fall at home (07/06/2016); Hyperlipidemia; and Hypertension. He presents to the clinic today for follow up after hospital admission. He was admitted on 07/08/2016 and discharged on 07/09/2016.   Per discharge note:   William Aguirre a 81 y.o.malewith insulin requiring diabetes mellitus presented to the emergency department after wife came home and found him on the floor. She had been working nights. The patient reports that he tripped on some furniture and fell on the floor. The patient is complaining of severe right knee pain. He also complains of weakness. He was noted to be hypoglycemic with a blood glucose of 63 on arrival. He said that his Lantus was recently increased in the last 2 months from 12 units to 75 units. He was taken off short acting insulin at the time. He was also noted to have some initial slurred speech and confusion. He does have known mild dementia. That has completely resolved at this time. He does have a history of TIA. He was evaluated by neurology in the emergency department and they felt that he was not having acute neurological symptoms. Unfortunately the patient has not been able to bear weight and stand since the fall. He is being admitted for observation and further evaluation of the right knee pain. Because we don't know how long he was down on the floor he is being evaluated for rhabdomyolysis.  1. Fall at home with subsequent right knee pain-unable to bear weight secondary to pain, admitted for observation, MRI of the right knee shows medial meniscus tear. Pt continues to have pain and swelling. Requested orthopedics consultation. Consulted PT for evaluation and assistance with ambulation. Tramadol ordered for pain in addition  to Tylenol. The patient does not tolerate hydrocodone by history.  Ortho will see patient outpatient in office in 1 week,  Pt to see Dr. Charlann Boxer.  PT recommended HHPT, rolling walker and 3 in 1 which has been ordered.  Ortho recommending rest, ice and WBAT and follow up in 1 week.   2. Fall-CK levels are OK, will stop IVF hydration. 3. Hypoglycemia secondary to insulin-suspect his basal insulin dose is too high. Based on weight reducedlantus from 75 unit to 30units, will further reduce to 25 units 3/29, continue to provide prandial and sliding coverage while in hospital. Hold prandial insulin if eats less than 50% of meal. Patient is to follow-up with his endocrinologist outpatient and PCP. Monitor blood glucose closely in the hospital. Hemoglobin A1c 8.0%. Observe hypoglycemia precautions.  Recommend testing 4x per day.  Fall precautions at home and ordered for HHPT/ RN Aide SW concerned about severe hypoglycemia at home as he is home alone while wife works nights.  4. Cognitive impairment with memory loss-reported as mild, wife reports that she has been managing patient fairly well at home. 5. Essential hypertension-resume home medications. 6. Coronary artery disease-resume aspirin daily   Per patient he actually fell getting into the car the night before the ER visit , this is what subsequently injured his knee.   Since decreasing insulin his blood sugars have been " holding steady" in the 130-150's. They follow up with Endocrinology next month .  He reports that he is not in any pain at this time. He is following  up with orthopedics on the 24th of this month.   He has no acute complaints at this time, except for wanting to have a new form filled out for a handicap parking sticker.   Review of Systems See HPI   Past Medical History:  Diagnosis Date  . Coronary artery disease    Moderate LAD, Diagonal, Circumflex disease by cath 2004  . Dementia   . Diabetes mellitus without complication  (HCC)   . Fall at home 07/06/2016  . Hyperlipidemia   . Hypertension     Social History   Social History  . Marital status: Married    Spouse name: N/A  . Number of children: 4  . Years of education: CPA   Occupational History  . Accountant     Retired   Social History Main Topics  . Smoking status: Former Smoker    Years: 3.00    Quit date: 04/13/1965  . Smokeless tobacco: Never Used  . Alcohol use No  . Drug use: No  . Sexual activity: Not on file   Other Topics Concern  . Not on file   Social History Narrative   Lives with wife in a one story home.   Retired IT trainer.   Right-handed   Caffeine: 1 cup per day       Past Surgical History:  Procedure Laterality Date  . CATARACT EXTRACTION, BILATERAL      Family History  Problem Relation Age of Onset  . Other Father     Deceased  . Other Mother     Deceased, 72  . Other Sister     Deceased  . Cancer Sister     Deceased  . Healthy Son     Living  . Heart disease Son     Deceased, 30 from heart valve dysfunction  . Healthy Daughter     Allergies  Allergen Reactions  . Hydrocodone-Acetaminophen Other (See Comments)     Delusions    Current Outpatient Prescriptions on File Prior to Visit  Medication Sig Dispense Refill  . ACCU-CHEK SOFTCLIX LANCETS lancets USE TO TEST BLOOD SUGAR 4 times  DAILY 100 each 2  . acetaminophen (TYLENOL) 325 MG tablet Take 2 tablets (650 mg total) by mouth every 6 (six) hours as needed for mild pain (or Fever >/= 101).    Marland Kitchen atorvastatin (LIPITOR) 80 MG tablet TAKE 1 TABLET BY MOUTH ONCE DAILY 30 tablet 8  . bisoprolol (ZEBETA) 5 MG tablet Take 1 tablet (5 mg total) by mouth 2 (two) times daily. 60 tablet 3  . clopidogrel (PLAVIX) 75 MG tablet TAKE 1 TABLET BY MOUTH ONCE DAILY WITH BREAKFAST 90 tablet 3  . glucose blood (ACCU-CHEK AVIVA PLUS) test strip Use to check blood sugar 4 times per day. Dx code: E11.9 100 each 12  . Insulin Glargine (LANTUS SOLOSTAR) 100 UNIT/ML Solostar  Pen Inject 25 Units into the skin every morning. 30 mL 11  . Melatonin 3 MG TABS Take 5 mg by mouth at bedtime.     Marland Kitchen omeprazole (PRILOSEC OTC) 20 MG tablet Take 20 mg by mouth daily.    Marland Kitchen SM ASPIRIN ADULT LOW STRENGTH 81 MG EC tablet TAKE 1 TABLET ( ) BY MOUTH ONCE DAILY 90 tablet 2  . traMADol (ULTRAM) 50 MG tablet Take 1 tablet (50 mg total) by mouth 2 (two) times daily as needed for severe pain. 180 tablet 0  . valsartan (DIOVAN) 80 MG tablet Take 1 tablet (80 mg total) by mouth  daily. 90 tablet 1   No current facility-administered medications on file prior to visit.     BP 124/70 (BP Location: Left Arm, Patient Position: Sitting, Cuff Size: Large)   Temp 97.6 F (36.4 C) (Oral)   Wt 202 lb 6.4 oz (91.8 kg)   BMI 29.89 kg/m       Objective:   Physical Exam  Constitutional: He is oriented to person, place, and time. He appears well-developed and well-nourished. No distress.  Cardiovascular: Normal rate, normal heart sounds and intact distal pulses.  Exam reveals no friction rub.   No murmur heard. Pulmonary/Chest: Effort normal and breath sounds normal. No respiratory distress. He has no wheezes. He has no rales. He exhibits no tenderness.  Musculoskeletal: He exhibits no edema, tenderness or deformity.  Walking with a rolling walker  Neurological: He is alert and oriented to person, place, and time.  Skin: Skin is warm and dry. No rash noted. He is not diaphoretic. No erythema. No pallor.  Psychiatric: He has a normal mood and affect. His behavior is normal. Judgment and thought content normal.  Nursing note and vitals reviewed.     Assessment & Plan:   1. Type 2 diabetes mellitus without complication, unspecified long term insulin use status (HCC) - Continue with current insulin dosing until seen by endocrinology  - CBC with Differential/Platelet - Basic metabolic panel  2. Tear of medial meniscus of right knee, current, unspecified tear type, sequela - Follow up with  orthopedics as directed  Shirline Frees, NP

## 2016-07-17 DIAGNOSIS — E109 Type 1 diabetes mellitus without complications: Secondary | ICD-10-CM | POA: Diagnosis not present

## 2016-07-17 DIAGNOSIS — R32 Unspecified urinary incontinence: Secondary | ICD-10-CM | POA: Diagnosis not present

## 2016-07-17 DIAGNOSIS — F0390 Unspecified dementia without behavioral disturbance: Secondary | ICD-10-CM | POA: Diagnosis not present

## 2016-07-17 DIAGNOSIS — S83241D Other tear of medial meniscus, current injury, right knee, subsequent encounter: Secondary | ICD-10-CM | POA: Diagnosis not present

## 2016-07-17 DIAGNOSIS — Z7902 Long term (current) use of antithrombotics/antiplatelets: Secondary | ICD-10-CM | POA: Diagnosis not present

## 2016-07-17 DIAGNOSIS — I2510 Atherosclerotic heart disease of native coronary artery without angina pectoris: Secondary | ICD-10-CM | POA: Diagnosis not present

## 2016-07-17 DIAGNOSIS — I10 Essential (primary) hypertension: Secondary | ICD-10-CM | POA: Diagnosis not present

## 2016-07-17 DIAGNOSIS — E785 Hyperlipidemia, unspecified: Secondary | ICD-10-CM | POA: Diagnosis not present

## 2016-07-17 DIAGNOSIS — Z7982 Long term (current) use of aspirin: Secondary | ICD-10-CM | POA: Diagnosis not present

## 2016-07-20 DIAGNOSIS — Z7982 Long term (current) use of aspirin: Secondary | ICD-10-CM | POA: Diagnosis not present

## 2016-07-20 DIAGNOSIS — F0390 Unspecified dementia without behavioral disturbance: Secondary | ICD-10-CM | POA: Diagnosis not present

## 2016-07-20 DIAGNOSIS — Z7902 Long term (current) use of antithrombotics/antiplatelets: Secondary | ICD-10-CM | POA: Diagnosis not present

## 2016-07-20 DIAGNOSIS — R32 Unspecified urinary incontinence: Secondary | ICD-10-CM | POA: Diagnosis not present

## 2016-07-20 DIAGNOSIS — E785 Hyperlipidemia, unspecified: Secondary | ICD-10-CM | POA: Diagnosis not present

## 2016-07-20 DIAGNOSIS — E109 Type 1 diabetes mellitus without complications: Secondary | ICD-10-CM | POA: Diagnosis not present

## 2016-07-20 DIAGNOSIS — S83241D Other tear of medial meniscus, current injury, right knee, subsequent encounter: Secondary | ICD-10-CM | POA: Diagnosis not present

## 2016-07-20 DIAGNOSIS — I2510 Atherosclerotic heart disease of native coronary artery without angina pectoris: Secondary | ICD-10-CM | POA: Diagnosis not present

## 2016-07-20 DIAGNOSIS — I10 Essential (primary) hypertension: Secondary | ICD-10-CM | POA: Diagnosis not present

## 2016-07-21 ENCOUNTER — Telehealth: Payer: Self-pay | Admitting: Cardiovascular Disease

## 2016-07-21 ENCOUNTER — Telehealth: Payer: Self-pay | Admitting: Endocrinology

## 2016-07-21 DIAGNOSIS — S83241D Other tear of medial meniscus, current injury, right knee, subsequent encounter: Secondary | ICD-10-CM | POA: Diagnosis not present

## 2016-07-21 DIAGNOSIS — E785 Hyperlipidemia, unspecified: Secondary | ICD-10-CM | POA: Diagnosis not present

## 2016-07-21 DIAGNOSIS — Z7982 Long term (current) use of aspirin: Secondary | ICD-10-CM | POA: Diagnosis not present

## 2016-07-21 DIAGNOSIS — F0390 Unspecified dementia without behavioral disturbance: Secondary | ICD-10-CM | POA: Diagnosis not present

## 2016-07-21 DIAGNOSIS — E109 Type 1 diabetes mellitus without complications: Secondary | ICD-10-CM | POA: Diagnosis not present

## 2016-07-21 DIAGNOSIS — I10 Essential (primary) hypertension: Secondary | ICD-10-CM | POA: Diagnosis not present

## 2016-07-21 DIAGNOSIS — Z7902 Long term (current) use of antithrombotics/antiplatelets: Secondary | ICD-10-CM | POA: Diagnosis not present

## 2016-07-21 DIAGNOSIS — I2510 Atherosclerotic heart disease of native coronary artery without angina pectoris: Secondary | ICD-10-CM | POA: Diagnosis not present

## 2016-07-21 DIAGNOSIS — R32 Unspecified urinary incontinence: Secondary | ICD-10-CM | POA: Diagnosis not present

## 2016-07-21 NOTE — Telephone Encounter (Signed)
[facility] called in and said that the patient stated that his Lantus script has been changed and they are requesting a new script.  CB# [phone]

## 2016-07-21 NOTE — Telephone Encounter (Signed)
As of now, 25 units qd. He will have f/u ov this week, and we'll update then.

## 2016-07-21 NOTE — Telephone Encounter (Signed)
Yvone Neu calling from [facility] about a possible drug interaction, please call to discuss at [phone].

## 2016-07-21 NOTE — Telephone Encounter (Signed)
Could you review and advise what the current Lantus dosage should be? Thanks!

## 2016-07-22 DIAGNOSIS — F0390 Unspecified dementia without behavioral disturbance: Secondary | ICD-10-CM | POA: Diagnosis not present

## 2016-07-22 DIAGNOSIS — Z7902 Long term (current) use of antithrombotics/antiplatelets: Secondary | ICD-10-CM | POA: Diagnosis not present

## 2016-07-22 DIAGNOSIS — Z7982 Long term (current) use of aspirin: Secondary | ICD-10-CM | POA: Diagnosis not present

## 2016-07-22 DIAGNOSIS — I2510 Atherosclerotic heart disease of native coronary artery without angina pectoris: Secondary | ICD-10-CM | POA: Diagnosis not present

## 2016-07-22 DIAGNOSIS — R32 Unspecified urinary incontinence: Secondary | ICD-10-CM | POA: Diagnosis not present

## 2016-07-22 DIAGNOSIS — S83241D Other tear of medial meniscus, current injury, right knee, subsequent encounter: Secondary | ICD-10-CM | POA: Diagnosis not present

## 2016-07-22 DIAGNOSIS — I10 Essential (primary) hypertension: Secondary | ICD-10-CM | POA: Diagnosis not present

## 2016-07-22 DIAGNOSIS — E785 Hyperlipidemia, unspecified: Secondary | ICD-10-CM | POA: Diagnosis not present

## 2016-07-22 DIAGNOSIS — E109 Type 1 diabetes mellitus without complications: Secondary | ICD-10-CM | POA: Diagnosis not present

## 2016-07-22 NOTE — Telephone Encounter (Signed)
Noted. Rx with 25 units daily was submitted to [facility] on 07/09/2016.

## 2016-07-22 NOTE — Telephone Encounter (Signed)
Please send prescription for Lantus to [facility] [phone]

## 2016-07-22 NOTE — Telephone Encounter (Signed)
LM for William Aguirre - per the pharmacist I spoke with initially they do not have this patient on file.

## 2016-07-22 NOTE — Telephone Encounter (Signed)
I contacted the patient and advised the rx for the lantus was submitted on 07/09/2016 for 25 units daily. Patient was advised we will wait until his next appointment before making any adjustments. Patient scheduled for 07/23/2016.

## 2016-07-22 NOTE — Telephone Encounter (Signed)
Spoke with pharmacist and they stated pt was on OTC omeprazole and plavix. Order was sent in for protonix due to plavix interaction.

## 2016-07-23 ENCOUNTER — Ambulatory Visit (INDEPENDENT_AMBULATORY_CARE_PROVIDER_SITE_OTHER): Payer: Medicare HMO | Admitting: Endocrinology

## 2016-07-23 ENCOUNTER — Encounter: Payer: Self-pay | Admitting: Endocrinology

## 2016-07-23 VITALS — BP 130/84 | HR 65 | Wt 203.0 lb

## 2016-07-23 DIAGNOSIS — Z7902 Long term (current) use of antithrombotics/antiplatelets: Secondary | ICD-10-CM | POA: Diagnosis not present

## 2016-07-23 DIAGNOSIS — E119 Type 2 diabetes mellitus without complications: Secondary | ICD-10-CM | POA: Diagnosis not present

## 2016-07-23 DIAGNOSIS — S83241D Other tear of medial meniscus, current injury, right knee, subsequent encounter: Secondary | ICD-10-CM | POA: Diagnosis not present

## 2016-07-23 DIAGNOSIS — E785 Hyperlipidemia, unspecified: Secondary | ICD-10-CM | POA: Diagnosis not present

## 2016-07-23 DIAGNOSIS — E109 Type 1 diabetes mellitus without complications: Secondary | ICD-10-CM | POA: Diagnosis not present

## 2016-07-23 DIAGNOSIS — Z7982 Long term (current) use of aspirin: Secondary | ICD-10-CM | POA: Diagnosis not present

## 2016-07-23 DIAGNOSIS — R32 Unspecified urinary incontinence: Secondary | ICD-10-CM | POA: Diagnosis not present

## 2016-07-23 DIAGNOSIS — F0390 Unspecified dementia without behavioral disturbance: Secondary | ICD-10-CM | POA: Diagnosis not present

## 2016-07-23 DIAGNOSIS — I10 Essential (primary) hypertension: Secondary | ICD-10-CM | POA: Diagnosis not present

## 2016-07-23 DIAGNOSIS — I2510 Atherosclerotic heart disease of native coronary artery without angina pectoris: Secondary | ICD-10-CM | POA: Diagnosis not present

## 2016-07-23 NOTE — Patient Instructions (Addendum)
check your blood sugar twice a day.  vary the time of day when you check, between before the 3 meals, and at bedtime.  also check if you have symptoms of your blood sugar being too high or too low.  please keep a record of the readings and bring it to your next appointment here (or you can bring the meter itself).  You can write it on any piece of paper.  please call us sooner if your blood sugar goes below 70, or if you have a lot of readings over 200.  Please continue the same lantus (25 units each morning).   Please come back for a follow-up appointment in 1 month.

## 2016-07-23 NOTE — Progress Notes (Signed)
Subjective:    Patient ID: William Aguirre, male    DOB: Sep 24, 1934, 82 y.o.   MRN: [id number]  HPI Pt returns for f/u of diabetes mellitus:  DM type: Insulin-requiring type 2.   Dx'ed: 1308.  Complications: CAD and polyneuropathy.   Therapy: insulin since soon after dx.  DKA: never.  Severe hypoglycemia: once, in early 2018.  Pancreatitis: never.   Other: he takes QD insulin, due to memory loss.  Interval history:  pt has memory loss, and is here with wife today.  Wife found him at home.  cbg by EMS was 83.  It was 63 and 48 in the hospital.  She says his diet had been good.  Insulin was reduced to 25 units qd.  Since hosp d/c, cbg's have been in the 100's.   Past Medical History:  Diagnosis Date  . Coronary artery disease    Moderate LAD, Diagonal, Circumflex disease by cath 2004  . Dementia   . Diabetes mellitus without complication (HCC)   . Fall at home 07/06/2016  . Hyperlipidemia   . Hypertension     Past Surgical History:  Procedure Laterality Date  . CATARACT EXTRACTION, BILATERAL      Social History   Social History  . Marital status: Married    Spouse name: N/A  . Number of children: 4  . Years of education: CPA   Occupational History  . Accountant     Retired   Social History Main Topics  . Smoking status: Former Smoker    Years: 3.00    Quit date: 04/13/1965  . Smokeless tobacco: Never Used  . Alcohol use No  . Drug use: No  . Sexual activity: Not on file   Other Topics Concern  . Not on file   Social History Narrative   Lives with wife in a one story home.   Retired IT trainer.   Right-handed   Caffeine: 1 cup per day       Current Outpatient Prescriptions on File Prior to Visit  Medication Sig Dispense Refill  . ACCU-CHEK SOFTCLIX LANCETS lancets USE TO TEST BLOOD SUGAR 4 times  DAILY 100 each 2  . acetaminophen (TYLENOL) 325 MG tablet Take 2 tablets (650 mg total) by mouth every 6 (six) hours as needed for mild pain (or Fever >/= 101).    Marland Kitchen  atorvastatin (LIPITOR) 80 MG tablet TAKE 1 TABLET BY MOUTH ONCE DAILY 30 tablet 8  . bisoprolol (ZEBETA) 5 MG tablet Take 1 tablet (5 mg total) by mouth 2 (two) times daily. 60 tablet 3  . clopidogrel (PLAVIX) 75 MG tablet TAKE 1 TABLET BY MOUTH ONCE DAILY WITH BREAKFAST 90 tablet 3  . glucose blood (ACCU-CHEK AVIVA PLUS) test strip Use to check blood sugar 4 times per day. Dx code: E11.9 100 each 12  . Insulin Glargine (LANTUS SOLOSTAR) 100 UNIT/ML Solostar Pen Inject 25 Units into the skin every morning. 30 mL 11  . Melatonin 3 MG TABS Take 6 mg by mouth at bedtime.     . pantoprazole (PROTONIX) 40 MG tablet Take 1 tablet (40 mg total) by mouth daily. 30 tablet 0  . SM ASPIRIN ADULT LOW STRENGTH 81 MG EC tablet TAKE 1 TABLET ( ) BY MOUTH ONCE DAILY 90 tablet 2  . traMADol (ULTRAM) 50 MG tablet Take 1 tablet (50 mg total) by mouth 2 (two) times daily as needed for severe pain. 180 tablet 0  . valsartan (DIOVAN) 80 MG tablet Take 1 tablet (  80 mg total) by mouth daily. 90 tablet 1   No current facility-administered medications on file prior to visit.     Allergies  Allergen Reactions  . Hydrocodone-Acetaminophen Other (See Comments)     Delusions    Family History  Problem Relation Age of Onset  . Other Father     Deceased  . Other Mother     Deceased, 59  . Other Sister     Deceased  . Cancer Sister     Deceased  . Healthy Son     Living  . Heart disease Son     Deceased, 30 from heart valve dysfunction  . Healthy Daughter     BP 130/84 (BP Location: Left Arm, Patient Position: Sitting)   Pulse 65   Wt 203 lb (92.1 kg)   SpO2 97%   BMI 29.98 kg/m   Review of Systems He has lost 2 lbs.      Objective:   Physical Exam VITAL SIGNS:  See vs page.  GENERAL: no distress.  Pulses: dorsalis pedis intact bilat.   MSK: no deformity of the feet CV: no leg edema.  Skin:  no ulcer on the feet.  normal color and temp on the feet.   Neuro: sensation is intact to touch on  the feet, but decreased from normal.    Lab Results  Component Value Date   HGBA1C 8.0 (H) 07/08/2016      Assessment & Plan:  Insulin-requiring type 2 DM, with CAD: prior to hospitalization, outpt glycemic control was appropriate for clinical situation.   Episode of AMS: the cbg noted by EMS (83) does not explain the AMS.  Dementia.  In this setting, he is not a candidate for aggressive glycemic control.  Decreased po intake, with resulting inpatient hypoglycemia.  The effect of this on decreased insulin requirement can last for months.   Patient Instructions  check your blood sugar twice a day.  vary the time of day when you check, between before the 3 meals, and at bedtime.  also check if you have symptoms of your blood sugar being too high or too low.  please keep a record of the readings and bring it to your next appointment here (or you can bring the meter itself).  You can write it on any piece of paper.  please call us sooner if your blood sugar goes below 70, or if you have a lot of readings over 200.  Please continue the same lantus (25 units each morning).   Please come back for a follow-up appointment in 1 month.

## 2016-07-24 DIAGNOSIS — I2510 Atherosclerotic heart disease of native coronary artery without angina pectoris: Secondary | ICD-10-CM | POA: Diagnosis not present

## 2016-07-24 DIAGNOSIS — I10 Essential (primary) hypertension: Secondary | ICD-10-CM | POA: Diagnosis not present

## 2016-07-24 DIAGNOSIS — F0390 Unspecified dementia without behavioral disturbance: Secondary | ICD-10-CM | POA: Diagnosis not present

## 2016-07-24 DIAGNOSIS — E109 Type 1 diabetes mellitus without complications: Secondary | ICD-10-CM | POA: Diagnosis not present

## 2016-07-24 DIAGNOSIS — Z7982 Long term (current) use of aspirin: Secondary | ICD-10-CM | POA: Diagnosis not present

## 2016-07-24 DIAGNOSIS — Z7902 Long term (current) use of antithrombotics/antiplatelets: Secondary | ICD-10-CM | POA: Diagnosis not present

## 2016-07-24 DIAGNOSIS — E785 Hyperlipidemia, unspecified: Secondary | ICD-10-CM | POA: Diagnosis not present

## 2016-07-24 DIAGNOSIS — S83241D Other tear of medial meniscus, current injury, right knee, subsequent encounter: Secondary | ICD-10-CM | POA: Diagnosis not present

## 2016-07-24 DIAGNOSIS — S83242A Other tear of medial meniscus, current injury, left knee, initial encounter: Secondary | ICD-10-CM

## 2016-07-24 DIAGNOSIS — R32 Unspecified urinary incontinence: Secondary | ICD-10-CM | POA: Diagnosis not present

## 2016-07-28 DIAGNOSIS — I2510 Atherosclerotic heart disease of native coronary artery without angina pectoris: Secondary | ICD-10-CM | POA: Diagnosis not present

## 2016-07-28 DIAGNOSIS — S83241D Other tear of medial meniscus, current injury, right knee, subsequent encounter: Secondary | ICD-10-CM | POA: Diagnosis not present

## 2016-07-28 DIAGNOSIS — R32 Unspecified urinary incontinence: Secondary | ICD-10-CM | POA: Diagnosis not present

## 2016-07-28 DIAGNOSIS — Z7982 Long term (current) use of aspirin: Secondary | ICD-10-CM | POA: Diagnosis not present

## 2016-07-28 DIAGNOSIS — F0390 Unspecified dementia without behavioral disturbance: Secondary | ICD-10-CM | POA: Diagnosis not present

## 2016-07-28 DIAGNOSIS — E785 Hyperlipidemia, unspecified: Secondary | ICD-10-CM | POA: Diagnosis not present

## 2016-07-28 DIAGNOSIS — I10 Essential (primary) hypertension: Secondary | ICD-10-CM | POA: Diagnosis not present

## 2016-07-28 DIAGNOSIS — E109 Type 1 diabetes mellitus without complications: Secondary | ICD-10-CM | POA: Diagnosis not present

## 2016-07-28 DIAGNOSIS — Z7902 Long term (current) use of antithrombotics/antiplatelets: Secondary | ICD-10-CM | POA: Diagnosis not present

## 2016-07-30 DIAGNOSIS — Z7902 Long term (current) use of antithrombotics/antiplatelets: Secondary | ICD-10-CM | POA: Diagnosis not present

## 2016-07-30 DIAGNOSIS — E785 Hyperlipidemia, unspecified: Secondary | ICD-10-CM | POA: Diagnosis not present

## 2016-07-30 DIAGNOSIS — Z7982 Long term (current) use of aspirin: Secondary | ICD-10-CM | POA: Diagnosis not present

## 2016-07-30 DIAGNOSIS — E109 Type 1 diabetes mellitus without complications: Secondary | ICD-10-CM | POA: Diagnosis not present

## 2016-07-30 DIAGNOSIS — I2510 Atherosclerotic heart disease of native coronary artery without angina pectoris: Secondary | ICD-10-CM | POA: Diagnosis not present

## 2016-07-30 DIAGNOSIS — S83241D Other tear of medial meniscus, current injury, right knee, subsequent encounter: Secondary | ICD-10-CM | POA: Diagnosis not present

## 2016-07-30 DIAGNOSIS — F0390 Unspecified dementia without behavioral disturbance: Secondary | ICD-10-CM | POA: Diagnosis not present

## 2016-07-30 DIAGNOSIS — R32 Unspecified urinary incontinence: Secondary | ICD-10-CM | POA: Diagnosis not present

## 2016-07-30 DIAGNOSIS — I10 Essential (primary) hypertension: Secondary | ICD-10-CM | POA: Diagnosis not present

## 2016-08-03 DIAGNOSIS — Z7902 Long term (current) use of antithrombotics/antiplatelets: Secondary | ICD-10-CM | POA: Diagnosis not present

## 2016-08-03 DIAGNOSIS — F0390 Unspecified dementia without behavioral disturbance: Secondary | ICD-10-CM | POA: Diagnosis not present

## 2016-08-03 DIAGNOSIS — E785 Hyperlipidemia, unspecified: Secondary | ICD-10-CM | POA: Diagnosis not present

## 2016-08-03 DIAGNOSIS — I10 Essential (primary) hypertension: Secondary | ICD-10-CM | POA: Diagnosis not present

## 2016-08-03 DIAGNOSIS — R32 Unspecified urinary incontinence: Secondary | ICD-10-CM | POA: Diagnosis not present

## 2016-08-03 DIAGNOSIS — I2510 Atherosclerotic heart disease of native coronary artery without angina pectoris: Secondary | ICD-10-CM | POA: Diagnosis not present

## 2016-08-03 DIAGNOSIS — S83241D Other tear of medial meniscus, current injury, right knee, subsequent encounter: Secondary | ICD-10-CM | POA: Diagnosis not present

## 2016-08-03 DIAGNOSIS — Z7982 Long term (current) use of aspirin: Secondary | ICD-10-CM | POA: Diagnosis not present

## 2016-08-03 DIAGNOSIS — E109 Type 1 diabetes mellitus without complications: Secondary | ICD-10-CM | POA: Diagnosis not present

## 2016-08-04 DIAGNOSIS — M25561 Pain in right knee: Secondary | ICD-10-CM | POA: Diagnosis not present

## 2016-08-05 ENCOUNTER — Telehealth: Payer: Self-pay | Admitting: Adult Health

## 2016-08-05 DIAGNOSIS — S83241D Other tear of medial meniscus, current injury, right knee, subsequent encounter: Secondary | ICD-10-CM | POA: Diagnosis not present

## 2016-08-05 DIAGNOSIS — I2510 Atherosclerotic heart disease of native coronary artery without angina pectoris: Secondary | ICD-10-CM | POA: Diagnosis not present

## 2016-08-05 DIAGNOSIS — I10 Essential (primary) hypertension: Secondary | ICD-10-CM | POA: Diagnosis not present

## 2016-08-05 DIAGNOSIS — R32 Unspecified urinary incontinence: Secondary | ICD-10-CM | POA: Diagnosis not present

## 2016-08-05 DIAGNOSIS — E785 Hyperlipidemia, unspecified: Secondary | ICD-10-CM | POA: Diagnosis not present

## 2016-08-05 DIAGNOSIS — Z7982 Long term (current) use of aspirin: Secondary | ICD-10-CM | POA: Diagnosis not present

## 2016-08-05 DIAGNOSIS — Z7902 Long term (current) use of antithrombotics/antiplatelets: Secondary | ICD-10-CM | POA: Diagnosis not present

## 2016-08-05 DIAGNOSIS — F0390 Unspecified dementia without behavioral disturbance: Secondary | ICD-10-CM | POA: Diagnosis not present

## 2016-08-05 DIAGNOSIS — E109 Type 1 diabetes mellitus without complications: Secondary | ICD-10-CM | POA: Diagnosis not present

## 2016-08-05 NOTE — Telephone Encounter (Signed)
I will route to Cory as FYI.  

## 2016-08-05 NOTE — Telephone Encounter (Signed)
William Aguirre is calling pt no longer needs skill nursing service however pt will continue with physical therapy

## 2016-08-05 NOTE — Telephone Encounter (Signed)
Noted  

## 2016-08-06 DIAGNOSIS — E109 Type 1 diabetes mellitus without complications: Secondary | ICD-10-CM | POA: Diagnosis not present

## 2016-08-06 DIAGNOSIS — Z7982 Long term (current) use of aspirin: Secondary | ICD-10-CM | POA: Diagnosis not present

## 2016-08-06 DIAGNOSIS — I10 Essential (primary) hypertension: Secondary | ICD-10-CM | POA: Diagnosis not present

## 2016-08-06 DIAGNOSIS — F0390 Unspecified dementia without behavioral disturbance: Secondary | ICD-10-CM | POA: Diagnosis not present

## 2016-08-06 DIAGNOSIS — S83241D Other tear of medial meniscus, current injury, right knee, subsequent encounter: Secondary | ICD-10-CM | POA: Diagnosis not present

## 2016-08-06 DIAGNOSIS — E785 Hyperlipidemia, unspecified: Secondary | ICD-10-CM | POA: Diagnosis not present

## 2016-08-06 DIAGNOSIS — R32 Unspecified urinary incontinence: Secondary | ICD-10-CM | POA: Diagnosis not present

## 2016-08-06 DIAGNOSIS — Z7902 Long term (current) use of antithrombotics/antiplatelets: Secondary | ICD-10-CM | POA: Diagnosis not present

## 2016-08-06 DIAGNOSIS — I2510 Atherosclerotic heart disease of native coronary artery without angina pectoris: Secondary | ICD-10-CM | POA: Diagnosis not present

## 2016-08-07 DIAGNOSIS — I10 Essential (primary) hypertension: Secondary | ICD-10-CM | POA: Diagnosis not present

## 2016-08-07 DIAGNOSIS — E785 Hyperlipidemia, unspecified: Secondary | ICD-10-CM | POA: Diagnosis not present

## 2016-08-07 DIAGNOSIS — I2510 Atherosclerotic heart disease of native coronary artery without angina pectoris: Secondary | ICD-10-CM | POA: Diagnosis not present

## 2016-08-07 DIAGNOSIS — S83241D Other tear of medial meniscus, current injury, right knee, subsequent encounter: Secondary | ICD-10-CM | POA: Diagnosis not present

## 2016-08-07 DIAGNOSIS — Z7982 Long term (current) use of aspirin: Secondary | ICD-10-CM | POA: Diagnosis not present

## 2016-08-07 DIAGNOSIS — Z7902 Long term (current) use of antithrombotics/antiplatelets: Secondary | ICD-10-CM | POA: Diagnosis not present

## 2016-08-07 DIAGNOSIS — E109 Type 1 diabetes mellitus without complications: Secondary | ICD-10-CM | POA: Diagnosis not present

## 2016-08-07 DIAGNOSIS — F0390 Unspecified dementia without behavioral disturbance: Secondary | ICD-10-CM | POA: Diagnosis not present

## 2016-08-07 DIAGNOSIS — R32 Unspecified urinary incontinence: Secondary | ICD-10-CM | POA: Diagnosis not present

## 2016-08-11 ENCOUNTER — Telehealth: Payer: Self-pay | Admitting: Endocrinology

## 2016-08-11 NOTE — Telephone Encounter (Signed)
Please call in lantus 25 u to [facility] rx # [phone] P # [phone]

## 2016-08-13 NOTE — Telephone Encounter (Signed)
This has been done [facility] sent it out to be delivered yesterday

## 2016-08-14 DIAGNOSIS — F0390 Unspecified dementia without behavioral disturbance: Secondary | ICD-10-CM | POA: Diagnosis not present

## 2016-08-14 DIAGNOSIS — Z7982 Long term (current) use of aspirin: Secondary | ICD-10-CM | POA: Diagnosis not present

## 2016-08-14 DIAGNOSIS — R32 Unspecified urinary incontinence: Secondary | ICD-10-CM | POA: Diagnosis not present

## 2016-08-14 DIAGNOSIS — Z7902 Long term (current) use of antithrombotics/antiplatelets: Secondary | ICD-10-CM | POA: Diagnosis not present

## 2016-08-14 DIAGNOSIS — S83241D Other tear of medial meniscus, current injury, right knee, subsequent encounter: Secondary | ICD-10-CM | POA: Diagnosis not present

## 2016-08-14 DIAGNOSIS — E785 Hyperlipidemia, unspecified: Secondary | ICD-10-CM | POA: Diagnosis not present

## 2016-08-14 DIAGNOSIS — I10 Essential (primary) hypertension: Secondary | ICD-10-CM | POA: Diagnosis not present

## 2016-08-14 DIAGNOSIS — I2510 Atherosclerotic heart disease of native coronary artery without angina pectoris: Secondary | ICD-10-CM | POA: Diagnosis not present

## 2016-08-14 DIAGNOSIS — E109 Type 1 diabetes mellitus without complications: Secondary | ICD-10-CM | POA: Diagnosis not present

## 2016-08-24 ENCOUNTER — Ambulatory Visit (INDEPENDENT_AMBULATORY_CARE_PROVIDER_SITE_OTHER): Payer: Medicare HMO | Admitting: Endocrinology

## 2016-08-24 ENCOUNTER — Encounter: Payer: Self-pay | Admitting: Endocrinology

## 2016-08-24 VITALS — BP 136/80 | HR 72 | Ht 69.0 in | Wt 205.0 lb

## 2016-08-24 DIAGNOSIS — E1142 Type 2 diabetes mellitus with diabetic polyneuropathy: Secondary | ICD-10-CM | POA: Diagnosis not present

## 2016-08-24 NOTE — Patient Instructions (Addendum)
check your blood sugar twice a day.  vary the time of day when you check, between before the 3 meals, and at bedtime.  also check if you have symptoms of your blood sugar being too high or too low.  please keep a record of the readings and bring it to your next appointment here (or you can bring the meter itself).  You can write it on any piece of paper.  please call us sooner if your blood sugar goes below 70, or if you have a lot of readings over 200.  Please continue the same lantus (25 units each morning).   Please come back for a follow-up appointment in 2 months.

## 2016-08-24 NOTE — Progress Notes (Signed)
Subjective:    Patient ID: William Aguirre, male    DOB: 1934/05/27, 82 y.o.   MRN: [id number]  HPI Pt returns for f/u of diabetes mellitus:  DM type: Insulin-requiring type 2.   Dx'ed: 6213: 1986.  Complications: CAD and polyneuropathy.   Therapy: insulin since soon after dx.  DKA: never.  Severe hypoglycemia: one possible episode, in early 2018.   Pancreatitis: never.   Other: he takes QD insulin, due to memory loss; wife draws up insulin, but pt gives to himself; wife checks cbg's.    Interval history:  pt has memory loss, and is here with wife today.  no cbg record, but wife says cbg's vary from 90-140.  There is no trend throughout the day.   Past Medical History:  Diagnosis Date  . Coronary artery disease    Moderate LAD, Diagonal, Circumflex disease by cath 2004  . Dementia   . Diabetes mellitus without complication (HCC)   . Fall at home 07/06/2016  . Hyperlipidemia   . Hypertension     Past Surgical History:  Procedure Laterality Date  . CATARACT EXTRACTION, BILATERAL      Social History   Social History  . Marital status: Married    Spouse name: N/A  . Number of children: 4  . Years of education: CPA   Occupational History  . Accountant     Retired   Social History Main Topics  . Smoking status: Former Smoker    Years: 3.00    Quit date: 04/13/1965  . Smokeless tobacco: Never Used  . Alcohol use No  . Drug use: No  . Sexual activity: Not on file   Other Topics Concern  . Not on file   Social History Narrative   Lives with wife in a one story home.   Retired IT trainerCPA.   Right-handed   Caffeine: 1 cup per day       Current Outpatient Prescriptions on File Prior to Visit  Medication Sig Dispense Refill  . ACCU-CHEK SOFTCLIX LANCETS lancets USE TO TEST BLOOD SUGAR 4 times  DAILY 100 each 2  . acetaminophen (TYLENOL) 325 MG tablet Take 2 tablets (650 mg total) by mouth every 6 (six) hours as needed for mild pain (or Fever >/= 101).    Marland Kitchen. atorvastatin  (LIPITOR) 80 MG tablet TAKE 1 TABLET BY MOUTH ONCE DAILY 30 tablet 8  . bisoprolol (ZEBETA) 5 MG tablet Take 1 tablet (5 mg total) by mouth 2 (two) times daily. 60 tablet 3  . clopidogrel (PLAVIX) 75 MG tablet TAKE 1 TABLET BY MOUTH ONCE DAILY WITH BREAKFAST 90 tablet 3  . glucose blood (ACCU-CHEK AVIVA PLUS) test strip Use to check blood sugar 4 times per day. Dx code: E11.9 100 each 12  . Insulin Glargine (LANTUS SOLOSTAR) 100 UNIT/ML Solostar Pen Inject 25 Units into the skin every morning. 30 mL 11  . Melatonin 3 MG TABS Take 6 mg by mouth at bedtime.     . pantoprazole (PROTONIX) 40 MG tablet Take 1 tablet (40 mg total) by mouth daily. 30 tablet 0  . SM ASPIRIN ADULT LOW STRENGTH 81 MG EC tablet TAKE 1 TABLET (81MG ) BY MOUTH ONCE DAILY 90 tablet 2  . traMADol (ULTRAM) 50 MG tablet Take 1 tablet (50 mg total) by mouth 2 (two) times daily as needed for severe pain. 180 tablet 0  . valsartan (DIOVAN) 80 MG tablet Take 1 tablet (80 mg total) by mouth daily. 90 tablet 1  No current facility-administered medications on file prior to visit.     Allergies  Allergen Reactions  . Hydrocodone-Acetaminophen Other (See Comments)     Delusions    Family History  Problem Relation Age of Onset  . Other Father        Deceased  . Other Mother        Deceased, 52  . Other Sister        Deceased  . Cancer Sister        Deceased  . Healthy Son        Living  . Heart disease Son        Deceased, 30 from heart valve dysfunction  . Healthy Daughter     BP 136/80   Pulse 72   Ht 5\' 9"  (1.753 m)   Wt 205 lb (93 kg)   SpO2 96%   BMI 30.27 kg/m     Review of Systems Wife says pt has not recently had hypoglycemia.     Objective:   Physical Exam VITAL SIGNS:  See vs page GENERAL: no distress Pulses: dorsalis pedis intact bilat.   MSK: no deformity of the feet CV: no leg edema Skin:  no ulcer on the feet.  normal color and temp on the feet.  Neuro: sensation is intact to touch on the  feet, but decreased from normal.        Assessment & Plan:  Insulin-requiring type 2 DM, with polyneuropathy: well-controlled.  Dememtia: hx needs to be obtained from wife. Also, he is not a candidate for aggressive glycemic control.   Patient Instructions  check your blood sugar twice a day.  vary the time of day when you check, between before the 3 meals, and at bedtime.  also check if you have symptoms of your blood sugar being too high or too low.  please keep a record of the readings and bring it to your next appointment here (or you can bring the meter itself).  You can write it on any piece of paper.  please call us sooner if your blood sugar goes below 70, or if you have a lot of readings over 200.  Please continue the same lantus (25 units each morning).   Please come back for a follow-up appointment in 2 months.

## 2016-09-24 ENCOUNTER — Telehealth: Payer: Self-pay | Admitting: Adult Health

## 2016-09-24 NOTE — Telephone Encounter (Signed)
What can we do ?

## 2016-09-24 NOTE — Telephone Encounter (Signed)
Wife has to leave town on 6/19 to be [facility] [place]. Wife states she will be gone 3 months. Wife states pt needs someone to come in and help him in the am and pm. They would like a referral to [facility] and someone to help them get in touch with "[employer] Whe[employer]" Wife states not sure which agency can come out, but they need another referral. [facility] and [facility] in the past as well. [name]  [facility] : [phone]

## 2016-09-24 NOTE — Telephone Encounter (Signed)
Please advise 

## 2016-09-25 ENCOUNTER — Telehealth: Payer: Self-pay | Admitting: Adult Health

## 2016-09-25 NOTE — Telephone Encounter (Signed)
[facility] called to state that they received referral and are reaching out to pt's spouse.

## 2016-09-25 NOTE — Telephone Encounter (Signed)
° °  Pt call to say that he would like a call back concerning some issues he is having

## 2016-09-25 NOTE — Telephone Encounter (Signed)
Unable to contact patient. Phone line kept ringing. I will try again later.

## 2016-09-25 NOTE — Telephone Encounter (Signed)
Have completed [facility] referral and faxed to their office.   Have spoke with [facility]. Pt's insurance will only cover 6 weeks of in-home nursing care, maximum. Pt will have to qualify. Other than that, pt can pay out of pocket for in-home care. Rates are $20/hr for M-F and $22/hr Sat-Sun. There is a 4 hour minimum. For anything less than 4 hours it is $60/visit for any time from 3130mins-2hrs. They will come to the home to do a free assessment and then care can start within 48 hours.  Called wife to inquire about pt's ADL's. She states that he does walk with a cane and needs assistance with all other ADL's. Advised her of [facility] referral and [facility] information. She will call us back if she needs any further information. Also advised her we will call her with any information on the [facility] referral.

## 2016-09-28 NOTE — Telephone Encounter (Signed)
Spoke with pt's wife and she states that she did have a call from [facility] but has not had a chance to call them back. She does not have anything arranged for patient as of yet, but is still planning to leave town by tomorrow. Advised her to contact [facility] immediately for some assistance. She states she will.

## 2016-09-29 NOTE — Telephone Encounter (Signed)
LMTCB for pt's wife to find out status of in-home care

## 2016-09-30 NOTE — Telephone Encounter (Signed)
I left message for patient to return phone call.   

## 2016-10-01 NOTE — Telephone Encounter (Signed)
That would be up to the Assisted living facilities.

## 2016-10-01 NOTE — Telephone Encounter (Signed)
LMTCB to check on pt's status.

## 2016-10-01 NOTE — Telephone Encounter (Signed)
3rd attempt to contact patient but phone line kept ringing. Unable to leave voicemail.

## 2016-10-01 NOTE — Telephone Encounter (Signed)
Spoke with pt's wife. She was able to have the pt's daughter and a friend of the family (who is a CNA2) assist with pt's care while she has been out of town. She has been gone for only a few days. She will not be leaving for the 3 month stay until August, and is aware that she will need something more stable for his care at that time. She is wondering if pt would qualify for ALF.   Kandee Keenory - Please advise on ALF qualifications. Thanks!

## 2016-10-02 NOTE — Telephone Encounter (Signed)
Spoke with NVR IncElissa @ [facility]. She states that they have attempted to do intake on pt but wife has refused services.

## 2016-10-02 NOTE — Telephone Encounter (Signed)
William Aguirre @ [facility] called re: pt. Returned her call and LMTCB [phone]

## 2016-10-07 ENCOUNTER — Ambulatory Visit (INDEPENDENT_AMBULATORY_CARE_PROVIDER_SITE_OTHER): Payer: Medicare HMO | Admitting: Endocrinology

## 2016-10-07 ENCOUNTER — Ambulatory Visit: Payer: Self-pay | Admitting: Neurology

## 2016-10-07 ENCOUNTER — Encounter: Payer: Self-pay | Admitting: Endocrinology

## 2016-10-07 VITALS — BP 132/84 | HR 70 | Ht 69.0 in | Wt 210.0 lb

## 2016-10-07 DIAGNOSIS — E1142 Type 2 diabetes mellitus with diabetic polyneuropathy: Secondary | ICD-10-CM | POA: Diagnosis not present

## 2016-10-07 LAB — POCT GLYCOSYLATED HEMOGLOBIN (HGB A1C): HEMOGLOBIN A1C: 8.5

## 2016-10-07 MED ORDER — INSULIN GLARGINE 100 UNIT/ML SOLOSTAR PEN: 28.0000 [IU] | PEN_INJECTOR | SUBCUTANEOUS | 11 refills | Status: DC

## 2016-10-07 NOTE — Progress Notes (Signed)
Subjective:    Patient ID: William Aguirre, male    DOB: 07-25-1934, 82 y.o.   MRN: [id number]  HPI Pt returns for f/u of diabetes mellitus:  DM type: Insulin-requiring type 2.   Dx'ed: 1191: 1986.  Complications: CAD and polyneuropathy.   Therapy: insulin since soon after dx.  DKA: never.  Severe hypoglycemia: one possible episode, in early 2018.   Pancreatitis: never.   Other: he takes QD insulin, due to memory loss; wife draws up insulin, but pt gives to himself; wife checks cbg's.    Interval history:  pt has memory loss, and is here with wife today.  no cbg record, but wife says cbg's vary from 133-209.  There is no trend throughout the day.  Past Medical History:  Diagnosis Date  . Coronary artery disease    Moderate LAD, Diagonal, Circumflex disease by cath 2004  . Dementia   . Diabetes mellitus without complication (HCC)   . Fall at home 07/06/2016  . Hyperlipidemia   . Hypertension     Past Surgical History:  Procedure Laterality Date  . CATARACT EXTRACTION, BILATERAL      Social History   Social History  . Marital status: Married    Spouse name: N/A  . Number of children: 4  . Years of education: CPA   Occupational History  . Accountant     Retired   Social History Main Topics  . Smoking status: Former Smoker    Years: 3.00    Quit date: 04/13/1965  . Smokeless tobacco: Never Used  . Alcohol use No  . Drug use: No  . Sexual activity: Not on file   Other Topics Concern  . Not on file   Social History Narrative   Lives with wife in a one story home.   Retired IT trainerCPA.   Right-handed   Caffeine: 1 cup per day       Current Outpatient Prescriptions on File Prior to Visit  Medication Sig Dispense Refill  . ACCU-CHEK SOFTCLIX LANCETS lancets USE TO TEST BLOOD SUGAR 4 times  DAILY 100 each 2  . acetaminophen (TYLENOL) 325 MG tablet Take 2 tablets (650 mg total) by mouth every 6 (six) hours as needed for mild pain (or Fever >/= 101).    Marland Kitchen. atorvastatin  (LIPITOR) 80 MG tablet TAKE 1 TABLET BY MOUTH ONCE DAILY 30 tablet 8  . bisoprolol (ZEBETA) 5 MG tablet Take 1 tablet (5 mg total) by mouth 2 (two) times daily. 60 tablet 3  . clopidogrel (PLAVIX) 75 MG tablet TAKE 1 TABLET BY MOUTH ONCE DAILY WITH BREAKFAST 90 tablet 3  . glucose blood (ACCU-CHEK AVIVA PLUS) test strip Use to check blood sugar 4 times per day. Dx code: E11.9 100 each 12  . Melatonin 3 MG TABS Take 6 mg by mouth at bedtime.     . pantoprazole (PROTONIX) 40 MG tablet Take 1 tablet (40 mg total) by mouth daily. 30 tablet 0  . SM ASPIRIN ADULT LOW STRENGTH 81 MG EC tablet TAKE 1 TABLET (81MG ) BY MOUTH ONCE DAILY 90 tablet 2  . traMADol (ULTRAM) 50 MG tablet Take 1 tablet (50 mg total) by mouth 2 (two) times daily as needed for severe pain. 180 tablet 0  . valsartan (DIOVAN) 80 MG tablet Take 1 tablet (80 mg total) by mouth daily. 90 tablet 1   No current facility-administered medications on file prior to visit.     Allergies  Allergen Reactions  . Hydrocodone-Acetaminophen Other (See  Comments)     Delusions    Family History  Problem Relation Age of Onset  . Other Father        Deceased  . Other Mother        Deceased, 46  . Other Sister        Deceased  . Cancer Sister        Deceased  . Healthy Son        Living  . Heart disease Son        Deceased, 30 from heart valve dysfunction  . Healthy Daughter     BP 132/84   Pulse 70   Ht 5\' 9"  (1.753 m)   Wt 210 lb (95.3 kg)   SpO2 97%   BMI 31.01 kg/m    Review of Systems She denies pt has hypoglycemia.      Objective:   Physical Exam VITAL SIGNS:  See vs page GENERAL: no distress Pulses: dorsalis pedis intact bilat.   MSK: no deformity of the feet CV: trace bilat leg edema Skin:  no ulcer on the feet.  normal color and temp on the feet.  Neuro: sensation is intact to touch on the feet, but decreased from normal.     A1c=8.5%    Assessment & Plan:  Insulin-requiring type 2 DM, with CAD: worse.    Memory loss: he is not a candidate for multiple daily injections.    Patient Instructions  check your blood sugar twice a day.  vary the time of day when you check, between before the 3 meals, and at bedtime.  also check if you have symptoms of your blood sugar being too high or too low.  please keep a record of the readings and bring it to your next appointment here (or you can bring the meter itself).  You can write it on any piece of paper.  please call us sooner if your blood sugar goes below 70, or if you have a lot of readings over 200.  Please increase the lantus to 28 units each morning.   Please come back for a follow-up appointment in 3 months.

## 2016-10-07 NOTE — Patient Instructions (Addendum)
check your blood sugar twice a day.  vary the time of day when you check, between before the 3 meals, and at bedtime.  also check if you have symptoms of your blood sugar being too high or too low.  please keep a record of the readings and bring it to your next appointment here (or you can bring the meter itself).  You can write it on any piece of paper.  please call us sooner if your blood sugar goes below 70, or if you have a lot of readings over 200.  Please increase the lantus to 28 units each morning.   Please come back for a follow-up appointment in 3 months.

## 2016-10-08 NOTE — Telephone Encounter (Signed)
Pt states [facility] wants $4200 / mo.  [facility] wants $6 /meal  So they have decided to change pt's plan to a PPO plan next month. Wants this is odne, they will be calling for orders. [place]  And [facility] [place] a place [place] So they will be needing orders.  Wife would like you to be aware they will be sending request/orders

## 2016-10-12 ENCOUNTER — Ambulatory Visit (INDEPENDENT_AMBULATORY_CARE_PROVIDER_SITE_OTHER): Payer: Medicare HMO | Admitting: Neurology

## 2016-10-12 ENCOUNTER — Encounter: Payer: Self-pay | Admitting: Neurology

## 2016-10-12 VITALS — BP 140/78 | HR 68 | Ht 69.0 in | Wt 200.5 lb

## 2016-10-12 DIAGNOSIS — R2681 Unsteadiness on feet: Secondary | ICD-10-CM | POA: Diagnosis not present

## 2016-10-12 DIAGNOSIS — M6281 Muscle weakness (generalized): Secondary | ICD-10-CM

## 2016-10-12 DIAGNOSIS — G3184 Mild cognitive impairment, so stated: Secondary | ICD-10-CM | POA: Diagnosis not present

## 2016-10-12 MED ORDER — DONEPEZIL HCL 5 MG PO TABS: 5.0000 mg | ORAL_TABLET | Freq: Every day | ORAL | 1 refills | Status: DC

## 2016-10-12 NOTE — Progress Notes (Signed)
Reason for visit: Memory disturbance  William Aguirre is an 82 y.o. male  History of present illness:  William Aguirre is an 82 year old right-handed white male with a history of a progressive memory disturbance. MRI of the brain done previously in March 2017 showed a moderate level of small vessel disease and significant global atrophy was seen. The patient does have diabetes, he has a left footdrop. He has undergone a fall on 07/08/2016, the patient fell at home and he could not get back up again. The patient does report some urinary urgency and incontinence at times. He reports no significant changes in memory since last seen. He underwent physical therapy for his walking, he believes that this did not help him. The patient is using a cane for ambulation. He does report some mild back and neck pain, but nothing severe.  Past Medical History:  Diagnosis Date  . Coronary artery disease    Moderate LAD, Diagonal, Circumflex disease by cath 2004  . Dementia   . Diabetes mellitus without complication (HCC)   . Fall at home 07/06/2016  . Hyperlipidemia   . Hypertension     Past Surgical History:  Procedure Laterality Date  . CATARACT EXTRACTION, BILATERAL      Family History  Problem Relation Age of Onset  . Other Father        Deceased  . Other Mother        Deceased, 98  . Other Sister        Deceased  . Cancer Sister        Deceased  . Healthy Son        Living  . Heart disease Son        Deceased, 30 from heart valve dysfunction  . Healthy Daughter     Social history:  reports that he quit smoking about 51 years ago. He quit after 3.00 years of use. He has never used smokeless tobacco. He reports that he does not drink alcohol or use drugs.    Allergies  Allergen Reactions  . Hydrocodone-Acetaminophen Other (See Comments)     Delusions    Medications:  Prior to Admission medications   Medication Sig Start Date End Date Taking? Authorizing Provider  ACCU-CHEK  SOFTCLIX LANCETS lancets USE TO TEST BLOOD SUGAR 4 times  DAILY 07/09/16  Yes Johnson, Clanford L, MD  acetaminophen (TYLENOL) 325 MG tablet Take 2 tablets (650 mg total) by mouth every 6 (six) hours as needed for mild pain (or Fever >/= 101). 07/09/16  Yes Johnson, Clanford L, MD  atorvastatin (LIPITOR) 80 MG tablet TAKE 1 TABLET BY MOUTH ONCE DAILY 05/22/16  Yes Kathleene Hazel, MD  bisoprolol (ZEBETA) 5 MG tablet Take 1 tablet (5 mg total) by mouth 2 (two) times daily. 06/04/16  Yes Nafziger, Kandee Keen, NP  clopidogrel (PLAVIX) 75 MG tablet TAKE 1 TABLET BY MOUTH ONCE DAILY WITH BREAKFAST 01/02/16  Yes Kathleene Hazel, MD  glucose blood (ACCU-CHEK AVIVA PLUS) test strip Use to check blood sugar 4 times per day. Dx code: E11.9 07/09/16  Yes Johnson, Clanford L, MD  Insulin Glargine (LANTUS SOLOSTAR) 100 UNIT/ML Solostar Pen Inject 28 Units into the skin every morning. 10/07/16  Yes Romero Belling, MD  Melatonin 5 MG TABS Take 5 mg by mouth at bedtime as needed.   Yes [provider]  pantoprazole (PROTONIX) 40 MG tablet Take 1 tablet (40 mg total) by mouth daily. 07/22/16  Yes Kathleene Hazel, MD  SM ASPIRIN ADULT LOW STRENGTH 81 MG EC tablet TAKE 1 TABLET (81MG ) BY MOUTH ONCE DAILY 04/24/16  Yes Kathleene HazelMcAlhany, Christopher D, MD  traMADol (ULTRAM) 50 MG tablet Take 1 tablet (50 mg total) by mouth 2 (two) times daily as needed for severe pain. 09/17/14  Yes Worthy RancherWebb, Padonda B, FNP  valsartan (DIOVAN) 80 MG tablet Take 1 tablet (80 mg total) by mouth daily. 06/18/16  Yes Nafziger, Kandee Keenory, NP    ROS:  Out of a complete 14 system review of symptoms, the patient complains only of the following symptoms, and all other reviewed systems are negative.  Constipation Incontinence of the bladder Walking difficulty  Blood pressure 140/78, pulse 68, height 5\' 9"  (1.753 m), weight 200 lb 8 oz (90.9 kg).  Physical Exam  General: The patient is alert and cooperative at the time of the examination. The  patient is moderately obese.  Skin: No significant peripheral edema is noted.   Neurologic Exam  Mental status: The patient is alert and oriented x 3 at the time of the examination. The Mini-Mental Status Examination done today shows a total score 26/30. The patient is able to name 8 animals in 30 seconds..   Cranial nerves: Facial symmetry is present. Speech is normal, no aphasia or dysarthria is noted. Extraocular movements are full. Visual fields are full.  Motor: The patient has 4/5 strength in the deltoids, biceps, and triceps muscles bilaterally. The patient has weakness in the intrinsic muscles of the right hand, atrophy of the right first dorsal interosseous muscle. There is weakness with hip flexion bilaterally, a mild left foot drop is noted.  Sensory examination: Soft touch sensation is symmetric on the face, decreased on the left arm and leg.  Coordination: The patient has good finger-nose-finger and heel-to-shin bilaterally.  Gait and station: The patient has a wide-based gait, he is able to ambulate independently, but he usually uses a cane. Tandem gait was not attempted. Romberg is negative.  Reflexes: Deep tendon reflexes are symmetric, but are somewhat brisk at the knee jerks bilaterally and the right greater than left triceps reflexes. The biceps reflexes are depressed, the right ankle jerk reflex is present but depressed, absent on the left.   Assessment/Plan:  1. Memory disorder  2. Gait disorder  The patient appears to have evidence of weakness of all 4 extremities with proximal weakness in arms and legs with elevation in reflexes of the right triceps and the knee jerk reflexes bilaterally. This pattern would be suggestive of a cervical myelopathy. The patient does have a moderate level of small vessel disease by MRI of the brain. The patient will be sent for blood work today to look for a myopathic problem, he will be set up for MRI of the cervical spine. He will  follow-up in about 4 months.  Marlan Palau. Keith Quynn Vilchis MD 10/12/2016 2:34 PM  [facility] [address] [place], [place] [id number]  Phone [phone] Fax [phone]

## 2016-10-12 NOTE — Patient Instructions (Signed)
   We will check MRI of the neck and check some blood work today.

## 2016-10-15 LAB — ANGIOTENSIN CONVERTING ENZYME: Angio Convert Enzyme: 47 U/L (ref 14–82)

## 2016-10-15 LAB — ACETYLCHOLINE RECEPTOR, BINDING: AChR Binding Ab, Serum: <0.03 nmol/L (ref 0.00–0.24)

## 2016-10-15 LAB — CK: CK TOTAL: 157 U/L (ref 24–204)

## 2016-10-25 ENCOUNTER — Ambulatory Visit
Admission: RE | Admit: 2016-10-25 | Discharge: 2016-10-25 | Disposition: A | Discharge status: Home / Self Care | Payer: Medicare HMO | Source: Ambulatory Visit | Attending: Neurology | Admitting: Neurology

## 2016-10-25 DIAGNOSIS — M4802 Spinal stenosis, cervical region: Secondary | ICD-10-CM | POA: Diagnosis not present

## 2016-10-25 DIAGNOSIS — M6281 Muscle weakness (generalized): Secondary | ICD-10-CM

## 2016-10-25 DIAGNOSIS — R2681 Unsteadiness on feet: Secondary | ICD-10-CM

## 2016-10-26 ENCOUNTER — Ambulatory Visit: Payer: Medicare HMO | Admitting: Endocrinology

## 2016-10-26 ENCOUNTER — Other Ambulatory Visit: Payer: Self-pay | Admitting: Cardiovascular Disease

## 2016-10-26 ENCOUNTER — Other Ambulatory Visit: Payer: Self-pay

## 2016-10-26 ENCOUNTER — Telehealth: Payer: Self-pay | Admitting: Neurology

## 2016-10-26 DIAGNOSIS — M4712 Other spondylosis with myelopathy, cervical region: Secondary | ICD-10-CM

## 2016-10-26 NOTE — Telephone Encounter (Signed)
  I called patient. I talk with the wife. MRI of the cervical spine shows multilevel spinal stenosis and spinal cord compression from the C3-4 level through the C5-6 level. There is evidence of increased signal intensity in the spinal cord. I would recommend a neurosurgical consultation, they are amenable to this and I will get this set up.  MRI cervical 10/26/16:  IMPRESSION: This MRI of the cervical spine without contrast shows the following: 1.    There is one millimeter of anterolisthesis of C2 upon C3 associated with severe right and mild left facet hypertrophy and uncovertebral spurring. There does not appear to be spinal cord or nerve root impingement.  2.    There is severe spinal stenosis with spinal cord compression adjacent at C3-C4 due to retrolisthesis, disc protrusion, uncovertebral spurring and facet hypertrophy. There is abnormal signal within the spinal cord to the left consistent with myelopathic signal. There is also moderately severe left foraminal narrowing at could lead to left C4 nerve root compression. 3.    There is moderately severe spinal stenosis with possible spinal cord compression adjacent to C4-C5 due to retrolisthesis, disc bulging and uncovertebral spurring. There is abnormal signal within the spinal cord to the left of midline consistent with myelopathy. 4.    There is mild spinal stenosis at C5-C6 due to disc protrusion and uncovertebral spurring. Abnormal spinal cord signal is noted consistent with myelopathy bilaterally. 5.    There is mild spinal stenosis at C6-C7 but no spinal cord compression or nerve root compression. 6.    There are degenerative changes at C7-T1 and T1-T2 that did not lead to spinal stenosis or nerve root

## 2016-10-28 ENCOUNTER — Telehealth: Payer: Self-pay

## 2016-10-28 NOTE — Telephone Encounter (Signed)
I did an Adult Aspirin PA through covermymeds and it was denied immediately-received a denial fax shortly after. Reason: Aspirin is an over the counter product and per Medicare rules is not covered. Faxed denial back to [employer].

## 2016-11-11 DIAGNOSIS — M4712 Other spondylosis with myelopathy, cervical region: Secondary | ICD-10-CM | POA: Diagnosis not present

## 2016-11-11 DIAGNOSIS — Z6831 Body mass index (BMI) 31.0-31.9, adult: Secondary | ICD-10-CM | POA: Diagnosis not present

## 2016-11-11 DIAGNOSIS — M4802 Spinal stenosis, cervical region: Secondary | ICD-10-CM | POA: Diagnosis not present

## 2016-11-11 DIAGNOSIS — I10 Essential (primary) hypertension: Secondary | ICD-10-CM | POA: Diagnosis not present

## 2016-11-16 ENCOUNTER — Encounter: Payer: Self-pay | Admitting: Family Medicine

## 2016-11-16 ENCOUNTER — Ambulatory Visit (INDEPENDENT_AMBULATORY_CARE_PROVIDER_SITE_OTHER): Payer: Medicare HMO | Admitting: Family Medicine

## 2016-11-16 ENCOUNTER — Telehealth: Payer: Self-pay | Admitting: Neurology

## 2016-11-16 VITALS — BP 110/70 | HR 76 | Temp 98.4°F | Wt 207.2 lb

## 2016-11-16 DIAGNOSIS — Z659 Problem related to unspecified psychosocial circumstances: Secondary | ICD-10-CM

## 2016-11-16 DIAGNOSIS — R419 Unspecified symptoms and signs involving cognitive functions and awareness: Secondary | ICD-10-CM

## 2016-11-16 DIAGNOSIS — G3184 Mild cognitive impairment, so stated: Secondary | ICD-10-CM

## 2016-11-16 NOTE — Telephone Encounter (Signed)
I will be happy to speak with the daughter once [facility] has [facility]. This patient has evidence of spinal stenosis and spinal cord compression, he was referred to a neurosurgeon.

## 2016-11-16 NOTE — Telephone Encounter (Signed)
Pt's daughter called, I spoke with the pt, (she is not DPR) request MRI results. He also said there has been a change in his care, his daughter will be taking care of him. Said she will bring him to sign updated DPR
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
## 2016-11-17 ENCOUNTER — Telehealth: Payer: Self-pay | Admitting: Family Medicine

## 2016-11-17 ENCOUNTER — Other Ambulatory Visit: Payer: Self-pay | Admitting: Adult Health
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
## 2016-11-17 NOTE — Telephone Encounter (Signed)
Ok to send in Cozaar 50 mg daily. Please follow up in 2 weeks to make sure this medication is working

## 2016-11-17 NOTE — Telephone Encounter (Signed)
Left a message with spouse for pt to return my call.

## 2016-11-17 NOTE — Telephone Encounter (Signed)
Received a fax from [facility].  Valsartan has been recalled.  They are unable to fill his prescription.  Needs new.  Please advise.

## 2016-11-18 ENCOUNTER — Telehealth: Payer: Self-pay | Admitting: Neurology

## 2016-11-18 NOTE — Telephone Encounter (Signed)
Called and spoke with [place] (on [place] Advised rx prescribed 10/12/16. First time prescribed. Verified he was here last 10/12/16 at 2pm.  Verified pt address on file per daughter request. Nothing further needed at this time.

## 2016-11-18 NOTE — Telephone Encounter (Signed)
Patient's daughter calling too find out when patient started taking donepezil (ARICEPT) 5 MG tablet.

## 2016-11-19 MED ORDER — LOSARTAN POTASSIUM 50 MG PO TABS: 50.0000 mg | ORAL_TABLET | Freq: Every day | ORAL | 0 refills | Status: DC

## 2016-11-19 NOTE — Telephone Encounter (Signed)
Spoke to Onton[place]Shannon (daughter) and informed her of change in medication.  Made future appointment.

## 2016-12-03 ENCOUNTER — Other Ambulatory Visit: Payer: Self-pay | Admitting: Neurology

## 2016-12-03 ENCOUNTER — Other Ambulatory Visit: Payer: Self-pay | Admitting: Adult Health

## 2016-12-03 DIAGNOSIS — I10 Essential (primary) hypertension: Secondary | ICD-10-CM

## 2016-12-03 NOTE — Telephone Encounter (Signed)
pts daughter is calling stating that the pt has been out since Tuesday 12/01/16 daughter want to know if some can be called in to a local pharmacy [facility] at [facility].  Rx's needed is bisoprolol,clopidogrel, pantoprazole, aspirin and valsartan.  Pt has a appointment on 12/04/16.

## 2016-12-03 NOTE — Telephone Encounter (Signed)
Appt on 12/04/16

## 2016-12-04 ENCOUNTER — Ambulatory Visit: Payer: Medicare HMO | Admitting: Adult Health

## 2016-12-04 MED ORDER — LOSARTAN POTASSIUM 50 MG PO TABS: 50.0000 mg | ORAL_TABLET | Freq: Every day | ORAL | 0 refills | Status: DC

## 2016-12-04 MED ORDER — BISOPROLOL FUMARATE 5 MG PO TABS
5.0000 mg | ORAL_TABLET | Freq: Two times a day (BID) | ORAL | 3 refills | Status: AC
Start: 2016-12-04 — End: ?

## 2016-12-04 NOTE — Telephone Encounter (Signed)
William Aguirre is prescribing losartan and bisoprolol.  Both sent to [facility] at [facility].  Valsartan has been discontinued due to the recall.  All others are prescribed by cardiology.  Will need to contact cardiology for those refills.

## 2016-12-09 ENCOUNTER — Ambulatory Visit (INDEPENDENT_AMBULATORY_CARE_PROVIDER_SITE_OTHER): Payer: Medicare HMO | Admitting: Adult Health

## 2016-12-09 ENCOUNTER — Encounter: Payer: Self-pay | Admitting: Adult Health

## 2016-12-09 VITALS — BP 120/78 | Temp 98.0°F | Ht 69.0 in | Wt 197.0 lb

## 2016-12-09 DIAGNOSIS — G3184 Mild cognitive impairment, so stated: Secondary | ICD-10-CM

## 2016-12-09 MED ORDER — ONDANSETRON HCL 4 MG PO TABS: 4.0000 mg | ORAL_TABLET | Freq: Three times a day (TID) | ORAL | 0 refills | Status: DC | PRN
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
## 2016-12-09 NOTE — Patient Instructions (Signed)
It was great seeing you today   Please follow up with me in December for your physical. If you need anything in the meantime, please let me know   I have sent in Zofran for nausea

## 2016-12-11 NOTE — Telephone Encounter (Addendum)
Pt's daughter([name]) called she is wanting to know the dx for the reason the pt was put on the medication. She said she has POA but the bank is not honoring it because the patient signed it 3 days ago and she was told "dementia does not happen over night". She is requesting a letter stating he can not handle his finances. She is wanting to know if the impairment is mild,moderate?? She said she has seen changes in him over the past month. Please call to discuss at [phone]. She is aware the clinic closes at noon today and is closed on Monday for Labor Day and Dr Anne HahnWillis is out of the office until Tuesday, this can wait until then.

## 2016-12-13 ENCOUNTER — Encounter: Payer: Self-pay | Admitting: Neurology

## 2016-12-13 NOTE — Telephone Encounter (Signed)
I tried to call the daughter, unable to leave a message. I will dictate a letter.

## 2016-12-15 NOTE — Telephone Encounter (Signed)
Tried calling daughter [name]([name]) back again. Went to VM, unable to LVM. Tried son, Windy FastRonald. LVM asking for a call back from daughter. Wanted to let her know letter she requested is ready to be picked up.

## 2016-12-16 NOTE — Telephone Encounter (Signed)
Tried calling daughter again, mailbox still full, unable to LVM.   Letter ready for pick up if she calls.

## 2016-12-17 NOTE — Telephone Encounter (Signed)
Daughter stopped at office to get letter. I was in room with patient. I let front desk know I could bring it up once finished with rooming pt. When I went up front, pt left without getting letter. I called daughter. Advised Dr Anne HahnWillis tried calling her, I tried calling x2, went to VM and unable to LVM d/t full mailbox. That is why I tried calling her brother. She verbalized understanding and appreciation for call. She is on campus for class and will come by after 4pm to pick up.

## 2017-01-01 ENCOUNTER — Encounter: Payer: Self-pay | Admitting: Adult Health

## 2017-01-06 ENCOUNTER — Observation Stay (HOSPITAL_COMMUNITY)
Admission: EM | Admit: 2017-01-06 | Discharge: 2017-01-08 | Disposition: A | Discharge status: Skilled Nursing Facility | Payer: Medicare HMO | Attending: Internal Medicine | Admitting: Internal Medicine

## 2017-01-06 ENCOUNTER — Emergency Department (HOSPITAL_COMMUNITY): Payer: Medicare HMO

## 2017-01-06 ENCOUNTER — Observation Stay (HOSPITAL_COMMUNITY): Payer: Medicare HMO

## 2017-01-06 ENCOUNTER — Encounter (HOSPITAL_COMMUNITY): Payer: Self-pay | Admitting: Emergency Medicine

## 2017-01-06 ENCOUNTER — Ambulatory Visit: Payer: Medicare HMO | Admitting: Adult Health

## 2017-01-06 ENCOUNTER — Other Ambulatory Visit: Payer: Self-pay | Admitting: Adult Health

## 2017-01-06 ENCOUNTER — Emergency Department (HOSPITAL_BASED_OUTPATIENT_CLINIC_OR_DEPARTMENT_OTHER)
Admission: EM | Admit: 2017-01-06 | Discharge: 2017-01-06 | Discharge status: Against Medical Advice | Payer: Medicare HMO

## 2017-01-06 DIAGNOSIS — Z79899 Other long term (current) drug therapy: Secondary | ICD-10-CM | POA: Insufficient documentation

## 2017-01-06 DIAGNOSIS — G9349 Other encephalopathy: Secondary | ICD-10-CM | POA: Diagnosis not present

## 2017-01-06 DIAGNOSIS — E669 Obesity, unspecified: Secondary | ICD-10-CM | POA: Insufficient documentation

## 2017-01-06 DIAGNOSIS — Z794 Long term (current) use of insulin: Secondary | ICD-10-CM | POA: Insufficient documentation

## 2017-01-06 DIAGNOSIS — F0150 Vascular dementia without behavioral disturbance: Secondary | ICD-10-CM | POA: Insufficient documentation

## 2017-01-06 DIAGNOSIS — R4182 Altered mental status, unspecified: Secondary | ICD-10-CM | POA: Diagnosis not present

## 2017-01-06 DIAGNOSIS — Z7982 Long term (current) use of aspirin: Secondary | ICD-10-CM | POA: Insufficient documentation

## 2017-01-06 DIAGNOSIS — N390 Urinary tract infection, site not specified: Secondary | ICD-10-CM | POA: Diagnosis present

## 2017-01-06 DIAGNOSIS — G459 Transient cerebral ischemic attack, unspecified: Secondary | ICD-10-CM | POA: Diagnosis not present

## 2017-01-06 DIAGNOSIS — I2510 Atherosclerotic heart disease of native coronary artery without angina pectoris: Secondary | ICD-10-CM | POA: Diagnosis not present

## 2017-01-06 DIAGNOSIS — R29702 NIHSS score 2: Secondary | ICD-10-CM | POA: Diagnosis not present

## 2017-01-06 DIAGNOSIS — Z6830 Body mass index (BMI) 30.0-30.9, adult: Secondary | ICD-10-CM | POA: Diagnosis not present

## 2017-01-06 DIAGNOSIS — E119 Type 2 diabetes mellitus without complications: Secondary | ICD-10-CM | POA: Diagnosis not present

## 2017-01-06 DIAGNOSIS — Z885 Allergy status to narcotic agent status: Secondary | ICD-10-CM | POA: Diagnosis not present

## 2017-01-06 DIAGNOSIS — R2990 Unspecified symptoms and signs involving the nervous system: Secondary | ICD-10-CM

## 2017-01-06 DIAGNOSIS — Z87891 Personal history of nicotine dependence: Secondary | ICD-10-CM | POA: Diagnosis not present

## 2017-01-06 DIAGNOSIS — Z7902 Long term (current) use of antithrombotics/antiplatelets: Secondary | ICD-10-CM | POA: Insufficient documentation

## 2017-01-06 DIAGNOSIS — F068 Other specified mental disorders due to known physiological condition: Secondary | ICD-10-CM | POA: Insufficient documentation

## 2017-01-06 DIAGNOSIS — R29818 Other symptoms and signs involving the nervous system: Secondary | ICD-10-CM | POA: Diagnosis not present

## 2017-01-06 DIAGNOSIS — Z961 Presence of intraocular lens: Secondary | ICD-10-CM | POA: Diagnosis not present

## 2017-01-06 DIAGNOSIS — E1142 Type 2 diabetes mellitus with diabetic polyneuropathy: Secondary | ICD-10-CM

## 2017-01-06 DIAGNOSIS — R4701 Aphasia: Secondary | ICD-10-CM | POA: Diagnosis not present

## 2017-01-06 DIAGNOSIS — G9520 Unspecified cord compression: Secondary | ICD-10-CM | POA: Diagnosis not present

## 2017-01-06 DIAGNOSIS — G3184 Mild cognitive impairment, so stated: Secondary | ICD-10-CM | POA: Diagnosis not present

## 2017-01-06 DIAGNOSIS — I672 Cerebral atherosclerosis: Secondary | ICD-10-CM | POA: Insufficient documentation

## 2017-01-06 DIAGNOSIS — E7800 Pure hypercholesterolemia, unspecified: Secondary | ICD-10-CM | POA: Diagnosis not present

## 2017-01-06 DIAGNOSIS — I10 Essential (primary) hypertension: Secondary | ICD-10-CM | POA: Diagnosis present

## 2017-01-06 DIAGNOSIS — E785 Hyperlipidemia, unspecified: Secondary | ICD-10-CM | POA: Diagnosis not present

## 2017-01-06 DIAGNOSIS — E118 Type 2 diabetes mellitus with unspecified complications: Secondary | ICD-10-CM | POA: Diagnosis not present

## 2017-01-06 DIAGNOSIS — R6889 Other general symptoms and signs: Secondary | ICD-10-CM | POA: Diagnosis present

## 2017-01-06 LAB — COMPREHENSIVE METABOLIC PANEL
ALK PHOS: 96 U/L (ref 38–126)
ALT: 15 U/L — ABNORMAL LOW (ref 17–63)
ANION GAP: 4 — AB (ref 5–15)
AST: 19 U/L (ref 15–41)
Albumin: 3.6 g/dL (ref 3.5–5.0)
BUN: 9 mg/dL (ref 6–20)
CALCIUM: 9.4 mg/dL (ref 8.9–10.3)
CHLORIDE: 108 mmol/L (ref 101–111)
CO2: 26 mmol/L (ref 22–32)
Creatinine, Ser: 1.14 mg/dL (ref 0.61–1.24)
GFR calc non Af Amer: 59 mL/min — ABNORMAL LOW (ref >60)
Glucose, Bld: 108 mg/dL — ABNORMAL HIGH (ref 65–99)
POTASSIUM: 3.6 mmol/L (ref 3.5–5.1)
SODIUM: 138 mmol/L (ref 135–145)
Total Bilirubin: 0.8 mg/dL (ref 0.3–1.2)
Total Protein: 7.2 g/dL (ref 6.5–8.1)

## 2017-01-06 LAB — DIFFERENTIAL
BASOS PCT: 0 %
Basophils Absolute: 0 10*3/uL (ref 0.0–0.1)
EOS ABS: 0.2 10*3/uL (ref 0.0–0.7)
EOS PCT: 2 %
Lymphocytes Relative: 40 %
Lymphs Abs: 3.3 10*3/uL (ref 0.7–4.0)
MONO ABS: 0.6 10*3/uL (ref 0.1–1.0)
Monocytes Relative: 7 %
NEUTROS PCT: 51 %
Neutro Abs: 4.1 10*3/uL (ref 1.7–7.7)

## 2017-01-06 LAB — I-STAT CHEM 8, ED
BUN: 13 mg/dL (ref 6–20)
Calcium, Ion: 1.18 mmol/L (ref 1.15–1.40)
Chloride: 107 mmol/L (ref 101–111)
Creatinine, Ser: 1.1 mg/dL (ref 0.61–1.24)
GLUCOSE: 110 mg/dL — AB (ref 65–99)
HCT: 43 % (ref 39.0–52.0)
HEMOGLOBIN: 14.6 g/dL (ref 13.0–17.0)
POTASSIUM: 3.7 mmol/L (ref 3.5–5.1)
SODIUM: 142 mmol/L (ref 135–145)
TCO2: 25 mmol/L (ref 22–32)

## 2017-01-06 LAB — CBC
HCT: 41.8 % (ref 39.0–52.0)
Hemoglobin: 13.8 g/dL (ref 13.0–17.0)
MCH: 26.7 pg (ref 26.0–34.0)
MCHC: 33 g/dL (ref 30.0–36.0)
MCV: 80.9 fL (ref 78.0–100.0)
PLATELETS: 151 10*3/uL (ref 150–400)
RBC: 5.17 MIL/uL (ref 4.22–5.81)
RDW: 14.4 % (ref 11.5–15.5)
WBC: 8.2 10*3/uL (ref 4.0–10.5)

## 2017-01-06 LAB — I-STAT TROPONIN, ED: Troponin i, poc: 0.01 ng/mL (ref 0.00–0.08)

## 2017-01-06 LAB — HEMOGLOBIN A1C
Hgb A1c MFr Bld: 7.7 % — ABNORMAL HIGH (ref 4.8–5.6)
Mean Plasma Glucose: 174.29 mg/dL

## 2017-01-06 LAB — ETHANOL: Alcohol, Ethyl (B): <10 mg/dL (ref <10)

## 2017-01-06 LAB — CBG MONITORING, ED
GLUCOSE-CAPILLARY: 102 mg/dL — AB (ref 65–99)
GLUCOSE-CAPILLARY: 116 mg/dL — AB (ref 65–99)
GLUCOSE-CAPILLARY: 135 mg/dL — AB (ref 65–99)

## 2017-01-06 LAB — PROTIME-INR
INR: 1.08
PROTHROMBIN TIME: 13.9 s (ref 11.4–15.2)

## 2017-01-06 LAB — APTT: aPTT: 28 seconds (ref 24–36)

## 2017-01-06 LAB — TROPONIN I

## 2017-01-06 MED ORDER — SENNOSIDES-DOCUSATE SODIUM 8.6-50 MG PO TABS: 1.0000 | ORAL_TABLET | Freq: Every evening | ORAL | Status: DC | PRN

## 2017-01-06 MED ORDER — HEPARIN SODIUM (PORCINE) 5000 UNIT/ML IJ SOLN
5000.0000 [IU] | Freq: Three times a day (TID) | INTRAMUSCULAR | Status: DC
  Administered 2017-01-06 – 2017-01-08 (×5): 5000 [IU] via SUBCUTANEOUS
  Filled 2017-01-06 (×5): qty 1

## 2017-01-06 MED ORDER — SODIUM CHLORIDE 0.9 % IV SOLN
INTRAVENOUS | Status: DC
  Administered 2017-01-06 – 2017-01-08 (×2): via INTRAVENOUS

## 2017-01-06 MED ORDER — ATORVASTATIN CALCIUM 80 MG PO TABS
80.0000 mg | ORAL_TABLET | Freq: Every day | ORAL | Status: DC
  Administered 2017-01-07 – 2017-01-08 (×2): 80 mg via ORAL
  Filled 2017-01-06 (×2): qty 1

## 2017-01-06 MED ORDER — MELATONIN 3 MG PO TABS
3.0000 mg | ORAL_TABLET | Freq: Every evening | ORAL | Status: DC | PRN
  Filled 2017-01-06: qty 1

## 2017-01-06 MED ORDER — HYDRALAZINE HCL 20 MG/ML IJ SOLN: 5.0000 mg | Freq: Three times a day (TID) | INTRAMUSCULAR | Status: DC | PRN

## 2017-01-06 MED ORDER — PANTOPRAZOLE SODIUM 40 MG PO TBEC
40.0000 mg | DELAYED_RELEASE_TABLET | Freq: Every day | ORAL | Status: DC
  Administered 2017-01-07 – 2017-01-08 (×2): 40 mg via ORAL
  Filled 2017-01-06 (×2): qty 1

## 2017-01-06 MED ORDER — BISOPROLOL FUMARATE 5 MG PO TABS
5.0000 mg | ORAL_TABLET | Freq: Two times a day (BID) | ORAL | Status: DC
  Administered 2017-01-07 – 2017-01-08 (×3): 5 mg via ORAL
  Filled 2017-01-06 (×4): qty 1

## 2017-01-06 MED ORDER — INSULIN ASPART 100 UNIT/ML ~~LOC~~ SOLN
0.0000 [IU] | Freq: Three times a day (TID) | SUBCUTANEOUS | Status: DC
  Administered 2017-01-07 (×2): 3 [IU] via SUBCUTANEOUS
  Administered 2017-01-07 (×2): 1 [IU] via SUBCUTANEOUS

## 2017-01-06 MED ORDER — GADOBENATE DIMEGLUMINE 529 MG/ML IV SOLN
20.0000 mL | Freq: Once | INTRAVENOUS | Status: DC | PRN
Start: 2017-01-06 — End: 2017-01-08

## 2017-01-06 MED ORDER — LOSARTAN POTASSIUM 50 MG PO TABS: 50.0000 mg | ORAL_TABLET | Freq: Every day | ORAL | Status: DC

## 2017-01-06 MED ORDER — STROKE: EARLY STAGES OF RECOVERY BOOK
Freq: Once | Status: AC
  Administered 2017-01-06
  Filled 2017-01-06: qty 1

## 2017-01-06 MED ORDER — ASPIRIN 81 MG PO TBEC: 81.0000 mg | DELAYED_RELEASE_TABLET | Freq: Every day | ORAL | Status: DC

## 2017-01-06 MED ORDER — INSULIN GLARGINE 100 UNIT/ML ~~LOC~~ SOLN
15.0000 [IU] | SUBCUTANEOUS | Status: DC
  Administered 2017-01-07 – 2017-01-08 (×2): 15 [IU] via SUBCUTANEOUS
  Filled 2017-01-06 (×3): qty 0.15

## 2017-01-06 MED ORDER — ASPIRIN EC 81 MG PO TBEC
81.0000 mg | DELAYED_RELEASE_TABLET | Freq: Every day | ORAL | Status: DC
  Administered 2017-01-07 – 2017-01-08 (×2): 81 mg via ORAL
  Filled 2017-01-06 (×2): qty 1

## 2017-01-06 MED ORDER — DONEPEZIL HCL 5 MG PO TABS
5.0000 mg | ORAL_TABLET | Freq: Every day | ORAL | Status: DC
  Administered 2017-01-06 – 2017-01-07 (×2): 5 mg via ORAL
  Filled 2017-01-06 (×2): qty 1

## 2017-01-06 MED ORDER — CLOPIDOGREL BISULFATE 75 MG PO TABS
75.0000 mg | ORAL_TABLET | Freq: Every day | ORAL | Status: DC
  Administered 2017-01-07 – 2017-01-08 (×2): 75 mg via ORAL
  Filled 2017-01-06 (×2): qty 1

## 2017-01-06 MED ORDER — TRAMADOL HCL 50 MG PO TABS: 50.0000 mg | ORAL_TABLET | Freq: Two times a day (BID) | ORAL | Status: DC | PRN

## 2017-01-06 NOTE — ED Notes (Signed)
Patient transported to MRI 

## 2017-01-06 NOTE — ED Notes (Addendum)
Patient taken to MRI

## 2017-01-06 NOTE — ED Notes (Signed)
Spoke with Dr. Read Drivers about pt. Dr. Read Drivers agreed that pt could not be forced to be seen.

## 2017-01-06 NOTE — ED Notes (Signed)
Patient finished eating and waiting for MRI.

## 2017-01-06 NOTE — ED Notes (Signed)
Charge nurse -  Receiving nurse updated on condition, VS and neuro check

## 2017-01-06 NOTE — ED Notes (Signed)
Pt left department with daughter, ambulatory, using walker.

## 2017-01-06 NOTE — ED Notes (Signed)
Notified pharmacy to verify all pt medications

## 2017-01-06 NOTE — ED Notes (Signed)
Patient returned from MRI.

## 2017-01-06 NOTE — ED Notes (Signed)
Upon getting patient's VS in triage and pt just having blood drawn, pt c/o nausea. Pt's eyes then rolled back and he lost consciousness while in the chair. Pt snoring for a couple of seconds and rolled back to a room. Pt regained consciousness upon reaching room, MD at bedside.

## 2017-01-06 NOTE — ED Notes (Signed)
Lab at beside collecting blood.

## 2017-01-06 NOTE — H&P (Signed)
History and Physical    William Aguirre ZOX:[id number] DOB: 12/22/1934 DOA: 01/06/2017   PCP: Shirline Frees, NP   Patient coming from:  Home    Chief Complaint: Dysarthria   HPI: William Aguirre is a 82 y.o. male with medical history significant for HTN, HLD, CAD, diabetes, mild dementia, brought today ED for evaluation of strokelike symptoms. In review, the patient was noted yesterday evening to have expressive face E by his daughter. He was initially taken by his daughter to [facility], but he refuses to go there, wanting to go to [facility]. Instead, he went home. According to his daughter, he was having "wobbly "speech. No apparent unilateral weakness or decreased sensation. He denies any history of seizures, or prior strokes. Denies any history of TIA. Denies vertigo dizziness or vision changes. Denies headaches or seizures. Denies any chest pain, or shortness of breath. Denies any fever or chills, or night sweats. No syncope or presyncope. In the recent past, he had several falls. No acute incontinence or retention. No tobacco. No new meds or hormonal supplements. Does take a regular ASA a day, as well as Plavix Denies any recent long distance trips or recent surgeries. No sick contacts. Patient was not administered TPA as is beyond time window for treatment consideration. NIHSS2      ED Course:  BP 131/76   Resp 13   Ht  (1.727 m)   Wt 90.7 kg (200 lb)   SpO2 95%   BMI 30.41 kg/m   CMET and CBC normal UDS pending Etoh neg CT head neg for acute intracranial abnormalities, bu chronic ischemic changes seen CXR NAD  last echo in 2014 EF 65-70%, grade 1 diastolic, and vigorous systolic function.   EKG Sinus rhythm Borderline right axis deviation Borderline T wave abnormalities Baseline wander in lead(s) II III aVF When compared to prior, no significant changes seen.  no STEMI   Review of Systems:  As per HPI otherwise all other systems reviewed and are  negative  Past Medical History:  Diagnosis Date  . Coronary artery disease    Moderate LAD, Diagonal, Circumflex disease by cath 2004  . Dementia   . Diabetes mellitus without complication (HCC)   . Fall at home 07/06/2016  . Hyperlipidemia   . Hypertension     Past Surgical History:  Procedure Laterality Date  . CATARACT EXTRACTION, BILATERAL      Social History Social History   Social History  . Marital status: Married    Spouse name: N/A  . Number of children: 4  . Years of education: CPA   Occupational History  . Accountant     Retired   Social History Main Topics  . Smoking status: Former Smoker    Years: 3.00    Quit date: 04/13/1965  . Smokeless tobacco: Never Used  . Alcohol use No  . Drug use: No  . Sexual activity: Not on file   Other Topics Concern  . Not on file   Social History Narrative   Lives with wife in a one story home.   Retired IT trainer.   Right-handed   Caffeine: 1 cup per day        Allergies  Allergen Reactions  . Hydrocodone-Acetaminophen Other (See Comments)     Delusions    Family History  Problem Relation Age of Onset  . Other Father        Deceased  . Other Mother  Deceased, 13  . Other Sister        Deceased  . Cancer Sister        Deceased  . Healthy Son        Living  . Heart disease Son        Deceased, 30 from heart valve dysfunction  . Healthy Daughter       Prior to Admission medications   Medication Sig Start Date End Date Taking? Authorizing Provider  ACCU-CHEK SOFTCLIX LANCETS lancets USE TO TEST BLOOD SUGAR 4 times  DAILY 07/09/16   Standley Dakins L, MD  acetaminophen (TYLENOL) 325 MG tablet Take 2 tablets (650 mg total) by mouth every 6 (six) hours as needed for mild pain (or Fever >/= 101). 07/09/16   Johnson, Clanford L, MD  atorvastatin (LIPITOR) 80 MG tablet TAKE 1 TABLET BY MOUTH ONCE DAILY 05/22/16   Kathleene Hazel, MD  bisoprolol (ZEBETA) 5 MG tablet Take 1 tablet (5 mg total) by  mouth 2 (two) times daily. 12/04/16   Nafziger, Kandee Keen, NP  clopidogrel (PLAVIX) 75 MG tablet TAKE 1 TABLET BY MOUTH ONCE DAILY WITH BREAKFAST 01/02/16   Kathleene Hazel, MD  donepezil (ARICEPT) 5 MG tablet TAKE 1 TABLET BY MOUTH EVERY NIGHT AT BEDTIME 12/03/16   York Spaniel, MD  glucose blood (ACCU-CHEK AVIVA PLUS) test strip Use to check blood sugar 4 times per day. Dx code: E11.9 07/09/16   Cleora Fleet, MD  Insulin Glargine (LANTUS SOLOSTAR) 100 UNIT/ML Solostar Pen Inject 28 Units into the skin every morning. Patient taking differently: Inject 30 Units into the skin every morning.  10/07/16   Romero Belling, MD  losartan (COZAAR) 50 MG tablet Take 1 tablet (50 mg total) by mouth daily. 12/04/16   Nafziger, Kandee Keen, NP  Melatonin 5 MG TABS Take 5 mg by mouth at bedtime as needed.    [provider]  ondansetron (ZOFRAN) 4 MG tablet Take 1 tablet (4 mg total) by mouth every 8 (eight) hours as needed for nausea or vomiting. 12/09/16   Nafziger, Kandee Keen, NP  pantoprazole (PROTONIX) 40 MG tablet TAKE 1 TABLET BY MOUTH EVERY DAY *REPLACES OMEPRAZOLE* 10/26/16   Kathleene Hazel, MD  SM ASPIRIN ADULT LOW STRENGTH 81 MG EC tablet TAKE 1 TABLET ( ) BY MOUTH ONCE DAILY 04/24/16   Kathleene Hazel, MD  traMADol (ULTRAM) 50 MG tablet Take 1 tablet (50 mg total) by mouth 2 (two) times daily as needed for severe pain. 09/17/14   Worthy Rancher B, FNP    Physical Exam:  Vitals:   01/06/17 1226 01/06/17 1248 01/06/17 1400 01/06/17 1415  BP:  (!) 161/90 122/69 131/76  Resp:  SpO2:  95%    Weight: 90.7 kg (200 lb)     Height:  (1.727 m)      Constitutional: NAD, calm, comfortable Eyes: PERRL, lids and conjunctivae normal ENMT: Mucous membranes are moist, without exudate or lesions  Neck: normal, supple, no masses, no thyromegaly Respiratory: clear to auscultation bilaterally, no wheezing, no crackles. Normal respiratory effort  Cardiovascular: Regular rate and  rhythm, 1/6 murmur, rubs or gallops. No extremity edema. 2+ pedal pulses. No carotid bruits.  Abdomen: Soft, non tender, No hepatosplenomegaly. Bowel sounds positive.  Musculoskeletal: no clubbing / cyanosis. Moves all extremities Skin: no jaundice, No lesions.  Neurologic: Sensation intact  Strength equal in all extremities. He continues show mild dysarthria, but he is not confused.  Psychiatric:   Alert  and oriented x 3. Normal mood.     Labs on Admission: I have personally reviewed following labs and imaging studies  CBC:  Recent Labs Lab 01/06/17 1238 01/06/17 1249  WBC 8.2  --   NEUTROABS 4.1  --   HGB 13.8 14.6  HCT 41.8 43.0  MCV 80.9  --   PLT 151  --     Basic Metabolic Panel:  Recent Labs Lab 01/06/17 1238 01/06/17 1249  NA 138 142  K 3.6 3.7  CL 108 107  CO2 26  --   GLUCOSE 108* 110*  BUN 9 13  CREATININE 1.14 1.10  CALCIUM 9.4  --     GFR: Estimated Creatinine Clearance: 58.6 mL/min (by C-G formula based on SCr of 1.1 mg/dL).  Liver Function Tests:  Recent Labs Lab 01/06/17 1238  AST 19  ALT 15*  ALKPHOS 96  BILITOT 0.8  PROT 7.2  ALBUMIN 3.6   No results for input(s): LIPASE, AMYLASE in the last 168 hours. No results for input(s): AMMONIA in the last 168 hours.  Coagulation Profile:  Recent Labs Lab 01/06/17 1238  INR 1.08    Cardiac Enzymes: No results for input(s): CKTOTAL, CKMB, CKMBINDEX, TROPONINI in the last 168 hours.  BNP (last 3 results) No results for input(s): PROBNP in the last 8760 hours.  HbA1C: No results for input(s): HGBA1C in the last 72 hours.  CBG:  Recent Labs Lab 01/06/17 1243 01/06/17 1244  GLUCAP 102* 116*    Lipid Profile: No results for input(s): CHOL, HDL, LDLCALC, TRIG, CHOLHDL, LDLDIRECT in the last 72 hours.  Thyroid Function Tests: No results for input(s): TSH, T4TOTAL, FREET4, T3FREE, THYROIDAB in the last 72 hours.  Anemia Panel: No results for input(s): VITAMINB12, FOLATE,  FERRITIN, TIBC, IRON, RETICCTPCT in the last 72 hours.  Urine analysis:    Component Value Date/Time   COLORURINE YELLOW 07/08/2016 1338   APPEARANCEUR CLEAR 07/08/2016 1338   LABSPEC 1.016 07/08/2016 1338   PHURINE 5.0 07/08/2016 1338   GLUCOSEU NEGATIVE 07/08/2016 1338   HGBUR NEGATIVE 07/08/2016 1338   BILIRUBINUR NEGATIVE 07/08/2016 1338   BILIRUBINUR n 03/26/2015 1025   KETONESUR NEGATIVE 07/08/2016 1338   PROTEINUR NEGATIVE 07/08/2016 1338   UROBILINOGEN 0.2 03/26/2015 1025   UROBILINOGEN 0.2 05/29/2013 0216   NITRITE NEGATIVE 07/08/2016 1338   LEUKOCYTESUR NEGATIVE 07/08/2016 1338    Sepsis Labs: (procalcitonin:4,lacticidven:4) )No results found for this or any previous visit (from the past 240 hour(s)).   Radiological Exams on Admission: Ct Head Wo Contrast  Result Date: 01/06/2017 CLINICAL DATA:  82 year old male with focal neurologic deficit for greater than 6 hours. Possible cerebrovascular accident. EXAM: CT HEAD WITHOUT CONTRAST TECHNIQUE: Contiguous axial images were obtained from the base of the skull through the vertex without intravenous contrast. COMPARISON:  PET-CT 07/08/2016. FINDINGS: Brain: Mild to moderate cerebral atrophy. Patchy and confluent areas of decreased attenuation are noted throughout the deep and periventricular white matter of the cerebral hemispheres bilaterally, compatible with chronic microvascular ischemic disease. No evidence of acute infarction, hemorrhage, hydrocephalus, extra-axial collection or mass lesion/mass effect. Vascular: No hyperdense vessel or unexpected calcification. Skull: Normal. Negative for fracture or focal lesion. Sinuses/Orbits: No acute finding. Other: None. IMPRESSION: 1. No acute intracranial abnormalities. 2. Mild to moderate cerebral atrophy with extensive chronic microvascular ischemic changes in the cerebral white matter, similar to the prior examination. Electronically Signed   By: Trudie Reed M.D.   On:  01/06/2017 13:13   Dg Chest Portable 1 View  Result Date: 01/06/2017 CLINICAL DATA:  AMS,HX HTN,DM EXAM: PORTABLE CHEST 1 VIEW COMPARISON:  07/08/2016 FINDINGS: Normal mediastinum and cardiac silhouette. Normal pulmonary vasculature. No evidence of effusion, infiltrate, or pneumothorax. No acute bony abnormality. IMPRESSION: No acute cardiopulmonary process. Electronically Signed   By: Genevive Bi M.D.   On: 01/06/2017 13:28    EKG: Independently reviewed.  Assessment/Plan Active Problems:   Stroke-like symptoms   Diabetes mellitus (HCC)   Hyperlipidemia   Essential hypertension   CORONARY ATHEROSCLEROSIS NATIVE CORONARY ARTERY   Mild cognitive impairment with memory loss   CXR NAD  last echo in 2014   EKG Sinus rhythm Borderline right axis deviation Borderline T wave abnormalities Baseline wander in lead(s) II III aVF When compared to prior, no significant changes seen.  no STEMI    Stroke-like symptoms, acute dysarthria. Findings suspicious for stroke. Not a TPA candidate as last known normal was the night prior to hospitalization. CT head shows no  intracranial abnormalities, but chronic ischemic changes seen.  CMET and CBC normal UDS pending Etoh neg  Admit to Tele / Obs  Stroke order set  MRI/MRA brain   MRA neck Allow permissive HTN Echo  PT/OT/SLP  lipid panel A1C Aspirin and Lipitor daily  Neuro  following, awaiting recommendations   Type II Diabetes Current blood sugar level is 10 Lab Results  Component Value Date   HGBA1C 8.5 10/07/2016  Hgb A1C Hold home oral diabetic medications.  Lantus , SSI  CAD,  EKG w SR no changes from prior  Tn neg, last 2 D echo 2014  EF 65-70%, grade 1 diastolic, and vigorous systolic function.  Continue ASA, Plavix high dose statin,  Zebeta dose in am    Hypertension BP150/87   Resp 12  Continue home anti-hypertensive medications in am  Add Hydralazine Q6 hours as needed for BP 210/110 (pt on permissive  HTN)  Hyperlipidemia Continue home statins  History of Dementia, on Aricept  . No acute issues Continue Aricept   GERD, no acute symptoms Continue PPI   DVT prophylaxis:   Code Status:     Family Communication:  Discussed with patient Disposition Plan: Expect patient to be discharged to home after condition improves Consults called:     Admission status:    William Eke, PA-C [facility]   01/06/2017, 3:41 PM

## 2017-01-06 NOTE — ED Triage Notes (Signed)
Called to lobby to speak with pt. Pt was brought here by daughter for evaluation of slurred speech that began approximately 9pm. Pt is refusing to be seen in this ED. Pt is alert and oriented to person, place and time. Spoke with pt at length about being evaluated by doctor. Pt is adamantly refusing to be seen. Spoke with daughter at length as well. Pt is able to make his own decisions and I can't force him to be seen. Pt verbalizes understanding. Pt speech is soft but doesn't sound slurred. Pt was on phone speaking to his son without difficulty. Pt refuses to have vitals signs taken.

## 2017-01-06 NOTE — ED Notes (Signed)
XR at bedside

## 2017-01-06 NOTE — ED Provider Notes (Signed)
[facility]-EMERGENCY DEPT Provider Note   CSN: [id number] Arrival date & time: 01/06/17  1157     History   Chief Complaint Chief Complaint  Patient presents with  . Altered Mental Status    HPI William Aguirre is a 82 y.o. male.  The history is provided by the patient, a relative and medical records. No language interpreter was used.  Neurologic Problem  This is a new problem. The current episode started yesterday. The problem occurs constantly. The problem has not changed since onset.Pertinent negatives include no chest pain, no abdominal pain, no headaches and no shortness of breath. Nothing aggravates the symptoms. Nothing relieves the symptoms. He has tried nothing for the symptoms. The treatment provided no relief.    Past Medical History:  Diagnosis Date  . Coronary artery disease    Moderate LAD, Diagonal, Circumflex disease by cath 2004  . Dementia   . Diabetes mellitus without complication (HCC)   . Fall at home 07/06/2016  . Hyperlipidemia   . Hypertension     Patient Active Problem List   Diagnosis Date Noted  . Right knee pain 07/08/2016  . Unable to bear weight 07/08/2016  . Hypoglycemia 07/08/2016  . Upper airway cough syndrome 01/31/2015  . Dyspnea 01/31/2015  . Special screening for malignant neoplasms, colon 06/01/2013  . Mild cognitive impairment with memory loss 06/01/2013  . Unsteady gait 05/08/2013  . Hyperlipidemia 08/18/2008  . Essential hypertension 08/18/2008  . INTRAOCULAR LENS IMPLANT, HX OF 08/18/2008  . CATARACT EXTRACTION, RIGHT EYE, HX OF 08/18/2008  . Diabetes (HCC) 08/17/2008  . CORONARY ATHEROSCLEROSIS NATIVE CORONARY ARTERY 08/13/2008    Past Surgical History:  Procedure Laterality Date  . CATARACT EXTRACTION, BILATERAL         Home Medications    Prior to Admission medications   Medication Sig Start Date End Date Taking? Authorizing Provider  ACCU-CHEK SOFTCLIX LANCETS lancets USE TO TEST BLOOD SUGAR 4 times  DAILY 07/09/16    Standley Dakins L, MD  acetaminophen (TYLENOL) 325 MG tablet Take 2 tablets (650 mg total) by mouth every 6 (six) hours as needed for mild pain (or Fever >/= 101). 07/09/16   Johnson, Clanford L, MD  atorvastatin (LIPITOR) 80 MG tablet TAKE 1 TABLET BY MOUTH ONCE DAILY 05/22/16   Kathleene Hazel, MD  bisoprolol (ZEBETA) 5 MG tablet Take 1 tablet (5 mg total) by mouth 2 (two) times daily. 12/04/16   Nafziger, Kandee Keen, NP  clopidogrel (PLAVIX) 75 MG tablet TAKE 1 TABLET BY MOUTH ONCE DAILY WITH BREAKFAST 01/02/16   Kathleene Hazel, MD  donepezil (ARICEPT) 5 MG tablet TAKE 1 TABLET BY MOUTH EVERY NIGHT AT BEDTIME 12/03/16   York Spaniel, MD  glucose blood (ACCU-CHEK AVIVA PLUS) test strip Use to check blood sugar 4 times per day. Dx code: E11.9 07/09/16   Cleora Fleet, MD  Insulin Glargine (LANTUS SOLOSTAR) 100 UNIT/ML Solostar Pen Inject 28 Units into the skin every morning. Patient taking differently: Inject 30 Units into the skin every morning.  10/07/16   Romero Belling, MD  losartan (COZAAR) 50 MG tablet Take 1 tablet (50 mg total) by mouth daily. 12/04/16   Nafziger, Kandee Keen, NP  Melatonin 5 MG TABS Take 5 mg by mouth at bedtime as needed.    [provider]  ondansetron (ZOFRAN) 4 MG tablet Take 1 tablet (4 mg total) by mouth every 8 (eight) hours as needed for nausea or vomiting. 12/09/16   Shirline Frees, NP  pantoprazole (  PROTONIX) 40 MG tablet TAKE 1 TABLET BY MOUTH EVERY DAY *REPLACES OMEPRAZOLE* 10/26/16   Kathleene Hazel, MD  SM ASPIRIN ADULT LOW STRENGTH 81 MG EC tablet TAKE 1 TABLET ( ) BY MOUTH ONCE DAILY 04/24/16   Kathleene Hazel, MD  traMADol (ULTRAM) 50 MG tablet Take 1 tablet (50 mg total) by mouth 2 (two) times daily as needed for severe pain. 09/17/14   Eulis Foster, FNP    Family History Family History  Problem Relation Age of Onset  . Other Father        Deceased  . Other Mother        Deceased, 33  . Other Sister         Deceased  . Cancer Sister        Deceased  . Healthy Son        Living  . Heart disease Son        Deceased, 30 from heart valve dysfunction  . Healthy Daughter     Social History Social History  Substance Use Topics  . Smoking status: Former Smoker    Years: 3.00    Quit date: 04/13/1965  . Smokeless tobacco: Never Used  . Alcohol use No     Allergies   Hydrocodone-acetaminophen   Review of Systems Review of Systems  Constitutional: Negative for chills, fatigue and fever.  HENT: Negative for congestion and rhinorrhea.   Eyes: Negative for visual disturbance.  Respiratory: Negative for cough, chest tightness and shortness of breath.   Cardiovascular: Negative for chest pain.  Gastrointestinal: Negative for abdominal pain, constipation, diarrhea, nausea and vomiting.  Genitourinary: Negative for dysuria.  Musculoskeletal: Negative for back pain, neck pain and neck stiffness.  Skin: Negative for rash and wound.  Neurological: Positive for speech difficulty. Negative for seizures, light-headedness, numbness and headaches.  Psychiatric/Behavioral: Negative for agitation.  All other systems reviewed and are negative.    Physical Exam Updated Vital Signs BP (!) 161/90   Resp 13   Ht  (1.727 m)   Wt 90.7 kg (200 lb)   SpO2 95%   BMI 30.41 kg/m   Physical Exam  Constitutional: He is oriented to person, place, and time. He appears well-developed and well-nourished. No distress.  HENT:  Head: Normocephalic and atraumatic.  Nose: Nose normal.  Mouth/Throat: Oropharynx is clear and moist. No oropharyngeal exudate.  Eyes: Pupils are equal, round, and reactive to light. Conjunctivae and EOM are normal.  Neck: Normal range of motion.  Cardiovascular: Normal rate and intact distal pulses.   No murmur heard. Pulmonary/Chest: Effort normal. No stridor. No respiratory distress. He has no wheezes. He exhibits no tenderness.  Abdominal: There is no tenderness.    Musculoskeletal: He exhibits no tenderness.  Neurological: He is alert and oriented to person, place, and time. He is not disoriented. He displays normal reflexes. No cranial nerve deficit or sensory deficit. He exhibits normal muscle tone. He displays no seizure activity. Coordination normal.  Expressive aphasia and some dysarthria.   Skin: Capillary refill takes less than 2 seconds. He is not diaphoretic. No erythema. No pallor.  Psychiatric: He has a normal mood and affect.  Nursing note and vitals reviewed.    ED Treatments / Results  Labs (all labs ordered are listed, but only abnormal results are displayed) Labs Reviewed  COMPREHENSIVE METABOLIC PANEL - Abnormal; Notable for the following:       Result Value   Glucose, Bld 108 (*)    ALT  15 (*)    GFR calc non Af Amer 59 (*)    Anion gap 4 (*)    All other components within normal limits  HEMOGLOBIN A1C - Abnormal; Notable for the following:    Hgb A1c MFr Bld 7.7 (*)    All other components within normal limits  CBG MONITORING, ED - Abnormal; Notable for the following:    Glucose-Capillary 102 (*)    All other components within normal limits  I-STAT CHEM 8, ED - Abnormal; Notable for the following:    Glucose, Bld 110 (*)    All other components within normal limits  CBG MONITORING, ED - Abnormal; Notable for the following:    Glucose-Capillary 116 (*)    All other components within normal limits  PROTIME-INR  APTT  CBC  DIFFERENTIAL  ETHANOL  TROPONIN I  RAPID URINE DRUG SCREEN, HOSP PERFORMED  URINALYSIS, ROUTINE W REFLEX MICROSCOPIC  LIPID PANEL  I-STAT TROPONIN, ED    EKG  EKG Interpretation  Date/Time:  Wednesday January 06 2017 12:48:46 EDT Ventricular Rate:  61 PR Interval:    QRS Duration: 109 QT Interval:  425 QTC Calculation: 429 R Axis:   83 Text Interpretation:  Sinus rhythm Borderline right axis deviation Borderline T wave abnormalities Baseline wander in lead(s) II III aVF When compared  to prior, no significant changes seen.  no STEMI Confirmed by Theda Belfast ([id number]) on 01/06/2017 1:34:08 PM       Radiology Ct Head Wo Contrast  Result Date: 01/06/2017 CLINICAL DATA:  82 year old male with focal neurologic deficit for greater than 6 hours. Possible cerebrovascular accident. EXAM: CT HEAD WITHOUT CONTRAST TECHNIQUE: Contiguous axial images were obtained from the base of the skull through the vertex without intravenous contrast. COMPARISON:  PET-CT 07/08/2016. FINDINGS: Brain: Mild to moderate cerebral atrophy. Patchy and confluent areas of decreased attenuation are noted throughout the deep and periventricular white matter of the cerebral hemispheres bilaterally, compatible with chronic microvascular ischemic disease. No evidence of acute infarction, hemorrhage, hydrocephalus, extra-axial collection or mass lesion/mass effect. Vascular: No hyperdense vessel or unexpected calcification. Skull: Normal. Negative for fracture or focal lesion. Sinuses/Orbits: No acute finding. Other: None. IMPRESSION: 1. No acute intracranial abnormalities. 2. Mild to moderate cerebral atrophy with extensive chronic microvascular ischemic changes in the cerebral white matter, similar to the prior examination. Electronically Signed   By: Trudie Reed M.D.   On: 01/06/2017 13:13   Dg Chest Portable 1 View  Result Date: 01/06/2017 CLINICAL DATA:  AMS,HX HTN,DM EXAM: PORTABLE CHEST 1 VIEW COMPARISON:  07/08/2016 FINDINGS: Normal mediastinum and cardiac silhouette. Normal pulmonary vasculature. No evidence of effusion, infiltrate, or pneumothorax. No acute bony abnormality. IMPRESSION: No acute cardiopulmonary process. Electronically Signed   By: Genevive Bi M.D.   On: 01/06/2017 13:28    Procedures Procedures (including critical care time)  Medications Ordered in ED Medications  donepezil (ARICEPT) tablet 5 mg (not administered)  bisoprolol (ZEBETA) tablet 5 mg (not administered)  losartan  (COZAAR) tablet 50 mg (not administered)  pantoprazole (PROTONIX) EC tablet 40 mg (not administered)  Melatonin TABS 5 mg (not administered)  Insulin Glargine (LANTUS) Solostar Pen 15 Units (not administered)  atorvastatin (LIPITOR) tablet 80 mg (not administered)  clopidogrel (PLAVIX) tablet 75 mg (not administered)  traMADol (ULTRAM) tablet 50 mg (not administered)   stroke: mapping our early stages of recovery book (not administered)  0.9 %  sodium chloride infusion (not administered)  senna-docusate (Senokot-S) tablet 1 tablet (not administered)  heparin injection  5,000 Units (not administered)  insulin aspart (novoLOG) injection 0-9 Units (not administered)  aspirin EC tablet 81 mg (not administered)  hydrALAZINE (APRESOLINE) injection 5-10 mg (not administered)     Initial Impression / Assessment and Plan / ED Course  I have reviewed the triage vital signs and the nursing notes.  Pertinent labs & imaging results that were available during my care of the patient were reviewed by me and considered in my medical decision making (see chart for details).     William Aguirre is a 82 y.o. male with a past medical history significant for CAD, diabetes, hypertension, and hyperlipidemia who presents for speech abnormality since last night and then a syncopal event in triage. According to patient's daughter,patient was last normal at 8 PM last night. They report that at 9:30 PM, patient was having difficulty speaking with what sounds like expressive aphasia. Daughter provided a recording to corroborate description. They deny patient has had any recent symptoms aside from a chronic cough. They initially took patient to another facility last night, however, [facility] Patient was then taken back home.  This morning, patient continued to have difficulty speaking and they were directed to take him to the ED. While in triage getting blood  work, patient saw the needlestick and blood, and had a syncopal event lasting several seconds. According to daughter, patient does not like blood or "gruesome things".  Patient did not have a postictal period. Patient quickly brought back to exam room where he was seen.  On my initial exam, the  Only abnormality seen was a mild expressive aphasia. Patient was able to answer some questions but was having difficulty with others. Patient had symmetric grip strength, symmetric leg and arm strength. Normal sensation throughout. No neglect. Normal extra ocular movement. Normal pupil exam. Lungs clear. Chest and abdomen nontender.  Zenaida Niece screen is negative with no weakness seen.   Code stroke was not activated given hisLast normal time however, stroke orders were placed given the clinical concern. Patient went to CT scanner showing extensive ischemic disease but no acute abnormality.  Neurology was called and will see the patient. Anticipate further workup and management of possible stroke.  Patient will have other labs and imaging to look for occult infection or other causes of syncope but suspect a vasovagal syncope with his blood draw.  Neurology saw the patient and agree with stroke concern. They recommend MRIs. Pt will be admitted to medicine for further stroke workup.    Final Clinical Impressions(s) / ED Diagnoses   Final diagnoses:  Aphasia     Clinical Impression: 1. Aphasia     Disposition: Admit to Hospitalist service    Eiko Mcgowen, Canary Brim, MD 01/06/17 2011

## 2017-01-06 NOTE — ED Notes (Signed)
ED Provider at bedside. 

## 2017-01-06 NOTE — Consult Note (Signed)
Requesting Physician: Dr. Julieanne Manson    Chief Complaint: Expressive aphasia  History obtained from:  Patient  And daughter  HPI:                                                                                                                                         MUZAMMIL BRUINS is an 82 y.o. male with no previous stroke but does take aspirin and Plavix.  Patient does have hypertension.ith no previous stroke but does take aspirin and Plavix. The patient does have hypertension. Yesterday night at 2115 he was noted to have expressive aphasia. Pt was taken to [facility] but pt refused care because he wanted to go to [facility]. Pt continued to have confusion this morning. Pt is A&O x 3, disoriented to situation. He knows he is in the ED but unable to give a history and does not feel his speech is off. The daughter has a phone recording which clearly shows expressive  aphasia. At current time he has dysarthria and slight right NL fold decrease.   Date last known well: Date: 01/05/2017 Time last known well: Time: 21:15 tPA Given: No: out of window NIHSS 2 Modified Rankin: Rankin Score=0    Past Medical History:  Diagnosis Date  . Coronary artery disease    Moderate LAD, Diagonal, Circumflex disease by cath 2004  . Dementia   . Diabetes mellitus without complication (HCC)   . Fall at home 07/06/2016  . Hyperlipidemia   . Hypertension     Past Surgical History:  Procedure Laterality Date  . CATARACT EXTRACTION, BILATERAL      Family History  Problem Relation Age of Onset  . Other Father        Deceased  . Other Mother        Deceased, 72  . Other Sister        Deceased  . Cancer Sister        Deceased  . Healthy Son        Living  . Heart disease Son        Deceased, 30 from heart valve dysfunction  . Healthy Daughter    Social History:  reports that he quit smoking about 51 years ago. He quit after 3.00 years of use. He has never used smokeless tobacco. He reports that he  does not drink alcohol or use drugs.  Allergies:  Allergies  Allergen Reactions  . Hydrocodone-Acetaminophen Other (See Comments)     Delusions    Medications:  No current facility-administered medications for this encounter.    Current Outpatient Prescriptions  Medication Sig Dispense Refill  . ACCU-CHEK SOFTCLIX LANCETS lancets USE TO TEST BLOOD SUGAR 4 times  DAILY 100 each 2  . acetaminophen (TYLENOL) 325 MG tablet Take 2 tablets (650 mg total) by mouth every 6 (six) hours as needed for mild pain (or Fever >/= 101).    Marland Kitchen atorvastatin (LIPITOR) 80 MG tablet TAKE 1 TABLET BY MOUTH ONCE DAILY 30 tablet 8  . bisoprolol (ZEBETA) 5 MG tablet Take 1 tablet (5 mg total) by mouth 2 (two) times daily. 60 tablet 3  . clopidogrel (PLAVIX) 75 MG tablet TAKE 1 TABLET BY MOUTH ONCE DAILY WITH BREAKFAST 90 tablet 3  . donepezil (ARICEPT) 5 MG tablet TAKE 1 TABLET BY MOUTH EVERY NIGHT AT BEDTIME 30 tablet 3  . glucose blood (ACCU-CHEK AVIVA PLUS) test strip Use to check blood sugar 4 times per day. Dx code: E11.9 100 each 12  . Insulin Glargine (LANTUS SOLOSTAR) 100 UNIT/ML Solostar Pen Inject 28 Units into the skin every morning. (Patient taking differently: Inject 30 Units into the skin every morning. ) 30 mL 11  . losartan (COZAAR) 50 MG tablet Take 1 tablet (50 mg total) by mouth daily. 90 tablet 0  . Melatonin 5 MG TABS Take 5 mg by mouth at bedtime as needed.    . ondansetron (ZOFRAN) 4 MG tablet Take 1 tablet (4 mg total) by mouth every 8 (eight) hours as needed for nausea or vomiting. 20 tablet 0  . pantoprazole (PROTONIX) 40 MG tablet TAKE 1 TABLET BY MOUTH EVERY DAY *REPLACES OMEPRAZOLE* 90 tablet 0  . SM ASPIRIN ADULT LOW STRENGTH 81 MG EC tablet TAKE 1 TABLET ( ) BY MOUTH ONCE DAILY 90 tablet 2  . traMADol (ULTRAM) 50 MG tablet Take 1 tablet (50 mg total) by mouth 2  (two) times daily as needed for severe pain. 180 tablet 0     ROS:                                                                                                                                       History obtained from the patient  General ROS: negative for - chills, fatigue, fever, night sweats, weight gain or weight loss Psychological ROS: negative for - behavioral disorder, hallucinations, memory difficulties, mood swings or suicidal ideation Ophthalmic ROS: negative for - blurry vision, double vision, eye pain or loss of vision ENT ROS: negative for - epistaxis, nasal discharge, oral lesions, sore throat, tinnitus or vertigo Allergy and Immunology ROS: negative for - hives or itchy/watery eyes Hematological and Lymphatic ROS: negative for - bleeding problems, bruising or swollen lymph nodes Endocrine ROS: negative for - galactorrhea, hair pattern changes, polydipsia/polyuria or temperature intolerance Respiratory ROS: negative for - cough, hemoptysis, shortness of breath or wheezing Cardiovascular ROS: negative for - chest pain, dyspnea on exertion, edema or  irregular heartbeat Gastrointestinal ROS: negative for - abdominal pain, diarrhea, hematemesis, nausea/vomiting or stool incontinence Genito-Urinary ROS: negative for - dysuria, hematuria, incontinence or urinary frequency/urgency Musculoskeletal ROS: negative for - joint swelling or muscular weakness Neurological ROS: as noted in HPI Dermatological ROS: negative for rash and skin lesion changes  Neurologic Examination:                                                                                                      Blood pressure 131/76, resp. rate 13, height  (1.727 m), weight 90.7 kg (200 lb), SpO2 95 %.  HEENT-  Normocephalic, no lesions, without obvious abnormality.  Normal external eye and conjunctiva.  Normal TM's bilaterally.  Normal auditory canals and external ears. Normal external nose, mucus membranes and  septum.  Normal pharynx. Cardiovascular- S1, S2 normal, pulses palpable throughout   Lungs- chest clear, no wheezing, rales, normal symmetric air entry Abdomen- normal findings: bowel sounds normal Extremities- no edema Lymph-no adenopathy palpable Musculoskeletal-no joint tenderness, deformity or swelling Skin-warm and dry, no hyperpigmentation, vitiligo, or suspicious lesions  Neurological Examination Mental Status: Alert, oriented, thought content appropriate.  Speech dysarthric without evidence of aphasia.  Able to name, repeat, express and follow commands. Able to follow 3 step commands without difficulty. Cranial Nerves: II: Visual fields grossly normal,  III,IV, VI: ptosis right eye, extra-ocular motions intact bilaterally, pupils equal, round, reactive to light and accommodation V,VII: smile symmetric with right NL fold decrease, facial light touch sensation normal bilaterally VIII: hearing normal bilaterally IX,X: uvula rises symmetrically XI: bilateral shoulder shrug XII: midline tongue extension Motor: Right : Upper extremity   5/5    Left:     Upper extremity   5/5  Lower extremity   5/5     Lower extremity   5/5 Tone and bulk:normal tone throughout; no atrophy noted Sensory: Pinprick and light touch intact throughout, bilaterally Deep Tendon Reflexes: 2+ and symmetric throughout Plantars: Right: downgoing   Left: downgoing Cerebellar: normal finger-to-nose,and normal heel-to-shin test Gait: not tested       Lab Results: Basic Metabolic Panel:  Recent Labs Lab 01/06/17 1238 01/06/17 1249  NA 138 142  K 3.6 3.7  CL 108 107  CO2 26  --   GLUCOSE 108* 110*  BUN 9 13  CREATININE 1.14 1.10  CALCIUM 9.4  --     Liver Function Tests:  Recent Labs Lab 01/06/17 1238  AST 19  ALT 15*  ALKPHOS 96  BILITOT 0.8  PROT 7.2  ALBUMIN 3.6   No results for input(s): LIPASE, AMYLASE in the last 168 hours. No results for input(s): AMMONIA in the last 168  hours.  CBC:  Recent Labs Lab 01/06/17 1238 01/06/17 1249  WBC 8.2  --   NEUTROABS 4.1  --   HGB 13.8 14.6  HCT 41.8 43.0  MCV 80.9  --   PLT 151  --     Cardiac Enzymes: No results for input(s): CKTOTAL, CKMB, CKMBINDEX, TROPONINI in the last 168 hours.  Lipid Panel: No results for input(s): CHOL, TRIG, HDL,  CHOLHDL, VLDL, LDLCALC in the last 168 hours.  CBG:  Recent Labs Lab 01/06/17 1243 01/06/17 1244  GLUCAP 102* 116*    Microbiology: No results found for this or any previous visit.  Coagulation Studies:  Recent Labs  01/06/17 1238  LABPROT 13.9  INR 1.08    Imaging: Ct Head Wo Contrast  Result Date: 01/06/2017 CLINICAL DATA:  82 year old male with focal neurologic deficit for greater than 6 hours. Possible cerebrovascular accident. EXAM: CT HEAD WITHOUT CONTRAST TECHNIQUE: Contiguous axial images were obtained from the base of the skull through the vertex without intravenous contrast. COMPARISON:  PET-CT 07/08/2016. FINDINGS: Brain: Mild to moderate cerebral atrophy. Patchy and confluent areas of decreased attenuation are noted throughout the deep and periventricular white matter of the cerebral hemispheres bilaterally, compatible with chronic microvascular ischemic disease. No evidence of acute infarction, hemorrhage, hydrocephalus, extra-axial collection or mass lesion/mass effect. Vascular: No hyperdense vessel or unexpected calcification. Skull: Normal. Negative for fracture or focal lesion. Sinuses/Orbits: No acute finding. Other: None. IMPRESSION: 1. No acute intracranial abnormalities. 2. Mild to moderate cerebral atrophy with extensive chronic microvascular ischemic changes in the cerebral white matter, similar to the prior examination. Electronically Signed   By: Trudie Reed M.D.   On: 01/06/2017 13:13   Dg Chest Portable 1 View  Result Date: 01/06/2017 CLINICAL DATA:  AMS,HX HTN,DM EXAM: PORTABLE CHEST 1 VIEW COMPARISON:  07/08/2016 FINDINGS:  Normal mediastinum and cardiac silhouette. Normal pulmonary vasculature. No evidence of effusion, infiltrate, or pneumothorax. No acute bony abnormality. IMPRESSION: No acute cardiopulmonary process. Electronically Signed   By: Genevive Bi M.D.   On: 01/06/2017 13:28       Assessment and plan discussed with with attending physician and they are in agreement.    Felicie Morn PA-C [facility] [phone]  01/06/2017, 3:04 PM   Assessment: 82 y.o. male with dysarthria and right NL fold decrease after transient expressive aphasia last night. Not a tPA candidate. Likely a distal MCA territory TIA.   Stroke Risk Factors - hyperlipidemia and hypertension  Recommend 1. HgbA1c, fasting lipid panel 2. MRI/MRA of the brain without contrast 3. PT consult, OT consult, Speech consult 4. Echocardiogram 5. 80 mg of Atorvastatin 6. Prophylactic therapy-Antiplatelet med: ASA and Plavix  7. Risk factor modification 8. Telemetry monitoring 9. Frequent neuro checks 10 NPO until passes stroke swallow screen 11 please page stroke NP  Or  PA  Or MD from 8am -4 pm  as this patient from this time will be  followed by the stroke.   You can look them up on [email or web address]  Password [email or web address]   Ritta Slot, MD [facility] [phone]  If 7pm- 7am, please page neurology on call as listed in AMION.

## 2017-01-06 NOTE — ED Triage Notes (Signed)
Daughter is trying to convince pt to stay and be seen, and will let us know with their decision.

## 2017-01-06 NOTE — ED Notes (Signed)
Patient transported to CT. RN with pateint

## 2017-01-06 NOTE — ED Triage Notes (Signed)
Pt brought in by his daughter with c/o AMS - pt's daughter got home at about 2115 last night and shortly afterward, noticed patient having trouble with his words, calling his grandkids by odd names, and was confused. Pt was taken to [facility] but pt refused care because he wanted to go to [facility]. Pt continued to have confusion this morning. Pt is A&O x 3, disoriented to situation. Daughter states she just started caring for pt in August and knows he has dementia, but this was different. Patient ambulates with walker, no focal symptoms noted, gait at baseline. No droop or extremity weakness.

## 2017-01-06 NOTE — ED Notes (Signed)
Attempted report 

## 2017-01-07 ENCOUNTER — Observation Stay (HOSPITAL_BASED_OUTPATIENT_CLINIC_OR_DEPARTMENT_OTHER): Payer: Medicare HMO

## 2017-01-07 DIAGNOSIS — G451 Carotid artery syndrome (hemispheric): Secondary | ICD-10-CM

## 2017-01-07 DIAGNOSIS — I2510 Atherosclerotic heart disease of native coronary artery without angina pectoris: Secondary | ICD-10-CM | POA: Diagnosis not present

## 2017-01-07 DIAGNOSIS — I10 Essential (primary) hypertension: Secondary | ICD-10-CM

## 2017-01-07 DIAGNOSIS — G3184 Mild cognitive impairment, so stated: Secondary | ICD-10-CM

## 2017-01-07 DIAGNOSIS — N390 Urinary tract infection, site not specified: Secondary | ICD-10-CM | POA: Diagnosis present

## 2017-01-07 DIAGNOSIS — E1142 Type 2 diabetes mellitus with diabetic polyneuropathy: Secondary | ICD-10-CM

## 2017-01-07 DIAGNOSIS — E7800 Pure hypercholesterolemia, unspecified: Secondary | ICD-10-CM

## 2017-01-07 DIAGNOSIS — G459 Transient cerebral ischemic attack, unspecified: Secondary | ICD-10-CM | POA: Diagnosis present

## 2017-01-07 DIAGNOSIS — R4701 Aphasia: Secondary | ICD-10-CM

## 2017-01-07 DIAGNOSIS — I6789 Other cerebrovascular disease: Secondary | ICD-10-CM | POA: Diagnosis not present

## 2017-01-07 LAB — URINALYSIS, ROUTINE W REFLEX MICROSCOPIC
Bilirubin Urine: NEGATIVE
GLUCOSE, UA: NEGATIVE mg/dL
KETONES UR: NEGATIVE mg/dL
Nitrite: NEGATIVE
PROTEIN: NEGATIVE mg/dL
Specific Gravity, Urine: 1.016 (ref 1.005–1.030)
pH: 5 (ref 5.0–8.0)

## 2017-01-07 LAB — ECHOCARDIOGRAM COMPLETE
Height: 68 in
WEIGHTICAEL: 3199.32 [oz_av]

## 2017-01-07 LAB — CBC
HCT: 37.3 % — ABNORMAL LOW (ref 39.0–52.0)
Hemoglobin: 12.4 g/dL — ABNORMAL LOW (ref 13.0–17.0)
MCH: 26.8 pg (ref 26.0–34.0)
MCHC: 33.2 g/dL (ref 30.0–36.0)
MCV: 80.6 fL (ref 78.0–100.0)
PLATELETS: 139 10*3/uL — AB (ref 150–400)
RBC: 4.63 MIL/uL (ref 4.22–5.81)
RDW: 14.8 % (ref 11.5–15.5)
WBC: 8 10*3/uL (ref 4.0–10.5)

## 2017-01-07 LAB — BASIC METABOLIC PANEL
ANION GAP: 8 (ref 5–15)
BUN: 10 mg/dL (ref 6–20)
CALCIUM: 8.7 mg/dL — AB (ref 8.9–10.3)
CO2: 22 mmol/L (ref 22–32)
CREATININE: 1.01 mg/dL (ref 0.61–1.24)
Chloride: 108 mmol/L (ref 101–111)
GLUCOSE: 164 mg/dL — AB (ref 65–99)
Potassium: 3.3 mmol/L — ABNORMAL LOW (ref 3.5–5.1)
Sodium: 138 mmol/L (ref 135–145)

## 2017-01-07 LAB — LIPID PANEL
CHOL/HDL RATIO: 5.1 ratio
CHOLESTEROL: 158 mg/dL (ref 0–200)
HDL: 31 mg/dL — ABNORMAL LOW (ref >40)
LDL Cholesterol: 97 mg/dL (ref 0–99)
Triglycerides: 148 mg/dL (ref <150)
VLDL: 30 mg/dL (ref 0–40)

## 2017-01-07 LAB — RAPID URINE DRUG SCREEN, HOSP PERFORMED
Amphetamines: NOT DETECTED
BENZODIAZEPINES: NOT DETECTED
Barbiturates: NOT DETECTED
Cocaine: NOT DETECTED
OPIATES: NOT DETECTED
Tetrahydrocannabinol: NOT DETECTED

## 2017-01-07 LAB — GLUCOSE, CAPILLARY
GLUCOSE-CAPILLARY: 112 mg/dL — AB (ref 65–99)
GLUCOSE-CAPILLARY: 120 mg/dL — AB (ref 65–99)
Glucose-Capillary: 207 mg/dL — ABNORMAL HIGH (ref 65–99)
Glucose-Capillary: 123 mg/dL — ABNORMAL HIGH (ref 65–99)

## 2017-01-07 MED ORDER — CEFTRIAXONE SODIUM 1 G IJ SOLR
1.0000 g | INTRAMUSCULAR | Status: DC
  Administered 2017-01-08: 1 g via INTRAVENOUS
  Filled 2017-01-07 (×2): qty 10

## 2017-01-07 MED ORDER — POTASSIUM CHLORIDE CRYS ER 20 MEQ PO TBCR
40.0000 meq | EXTENDED_RELEASE_TABLET | Freq: Once | ORAL | Status: AC
  Administered 2017-01-07: 40 meq via ORAL
  Filled 2017-01-07: qty 2

## 2017-01-07 NOTE — Progress Notes (Signed)
  Echocardiogram 2D Echocardiogram has been performed.  Celene Skeen 01/07/2017, 4:18 PM

## 2017-01-07 NOTE — Progress Notes (Signed)
[facility]                                                                              Patient Demographics  Roxas Clymer, is a 82 y.o. male, DOB - [birth year], ZOX:[id number]  Admit date - 01/06/2017   Admitting Physician Ozella Rocks, MD  Outpatient Primary MD for the patient is Shirline Frees, NP  Outpatient specialists:   LOS - 0  days   Medical records reviewed and are as summarized below:    Chief Complaint  Patient presents with  . Altered Mental Status       Brief summary   Per admit note by Dr. Margot Ables on 9/26  KYLIE GROS is a 82 y.o. male with medical history significant for HTN, HLD, CAD, diabetes, mild dementia, brought today ED for evaluation of strokelike symptoms. In review, the patient was noted yesterday evening to have expressive face E by his daughter. He was initially taken by his daughter to [facility], but he refuses to go there, wanting to go to [facility] Instead, he went home. According to his daughter, he was having "wobbly "speech. No apparent unilateral weakness or decreased sensation.     Assessment & Plan    Principal Problem:   TIA (transient ischemic attack) - Presented with slurred speech. Outside of the TPA window due to delayed presentation -MRI of the brain showed no acute intracranial abnormality. 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 MRA neck showed patent carotid arteries systems bilaterally without high-grade or critical flow-limiting stenosis. - Lipid panel showed LDL 97, placed on statin, hemoglobin A1c 7.7. UDS negative - follow-up 2-D echo - PTOT evaluation recommended 24 7 supervision,SNF.however per daughter it has been difficult to take care of him at home, recently was kicked out by his wife  Active Problems:   Diabetes mellitus (HCC) - continue sliding scale insulin, Lantus 15 units daily    Hyperlipidemia - lipid panel  showed an LDL of 7, placed on statin    Essential hypertension - currently stable, continue be bisoprolol    CORONARY ATHEROSCLEROSIS NATIVE CORONARY ARTERY - stable, no chest pain or shortness of breath, continue aspirin, statin, beta blocker    Mild cognitive impairment with memory loss - continue Aricept    UTI (urinary tract infection) - follow urine culture and sensitivities, placed on IV Rocephin  Code Status:full  DVT Prophylaxis:  heparin Family Communication: Discussed in detail with the patient, all imaging results, lab results explained to the patient   Disposition Plan: SNF when bed available, likely in a.m.  Time Spent in minutes   25 minutes  Procedures:    Consultants:   neurology  Antimicrobials:   IV Rocephin, 9/27   Medications  Scheduled Meds: . aspirin EC  81 mg Oral Daily  . atorvastatin  80 mg Oral Daily  . bisoprolol  5 mg Oral BID  . clopidogrel  75 mg Oral Daily  . donepezil  5 mg Oral QHS  . heparin  5,000 Units Subcutaneous Q8H  . insulin aspart  0-9 Units Subcutaneous TID WC  .  insulin glargine  15 Units Subcutaneous BH-q7a  . pantoprazole  40 mg Oral Daily  . potassium chloride  40 mEq Oral Once   Continuous Infusions: . sodium chloride 75 mL/hr at 01/06/17 2346  . cefTRIAXone (ROCEPHIN)  IV     PRN Meds:.gadobenate dimeglumine, hydrALAZINE, Melatonin, senna-docusate, traMADol   Antibiotics   Anti-infectives    Start     Dose/Rate Route Frequency Ordered Stop   01/07/17 0630  cefTRIAXone (ROCEPHIN) 1 g in dextrose 5 % 50 mL IVPB     1 g 100 mL/hr over 30 Minutes Intravenous Every 24 hours 01/07/17 1610          Subjective:   Kaesen Rodriguez was seen and examined today.   Patient denies dizziness, chest pain, shortness of breath, abdominal pain, N/V/D/C, new weakness, numbess, tingling. No acute events overnight.    Objective:   Vitals:   01/07/17 0200 01/07/17 0400 01/07/17 0522 01/07/17 0948  BP: (!) 163/80 (!)  151/73 123/87 (!) 145/70  Pulse: 64 60 (!) 59 62  Resp: 18 18 18 18   Temp:   98.4 F (36.9 C) 98.1 F (36.7 C)  TempSrc:   Oral Oral  SpO2: 98% 97% 99% 95%  Weight:      Height:        Intake/Output Summary (Last 24 hours) at 01/07/17 1317 Last data filed at 01/07/17 0346  Gross per 24 hour  Intake            242.5 ml  Output             1000 ml  Net           -757.5 ml     Wt Readings from Last 3 Encounters:  01/06/17 90.7 kg (199 lb 15.3 oz)  12/09/16 89.4 kg (197 lb)  11/16/16 94 kg (207 lb 3.2 oz)     Exam  General: Alert and oriented x 3, NAD  Eyes:   HEENT:  Atraumatic, normocephalic  Cardiovascular: S1 S2 auscultated, no rubs, murmurs or gallops. Regular rate and rhythm.  Respiratory: Clear to auscultation bilaterally, no wheezing, rales or rhonchi  Gastrointestinal: Soft, nontender, nondistended, + bowel sounds  Ext: no pedal edema bilaterally  Neuro: AAOx3, Cr N's II- XII. Strength 5/5 upper and lower extremities bilaterally, speech clear  Musculoskeletal: No digital cyanosis, clubbing  Skin: No rashes  Psych: Normal affect and demeanor, alert and oriented x3    Data Reviewed:  I have personally reviewed following labs and imaging studies  Micro Results No results found for this or any previous visit (from the past 240 hour(s)).  Radiology Reports Ct Head Wo Contrast  Result Date: 01/06/2017 CLINICAL DATA:  82 year old male with focal neurologic deficit for greater than 6 hours. Possible cerebrovascular accident. EXAM: CT HEAD WITHOUT CONTRAST TECHNIQUE: Contiguous axial images were obtained from the base of the skull through the vertex without intravenous contrast. COMPARISON:  PET-CT 07/08/2016. FINDINGS: Brain: Mild to moderate cerebral atrophy. Patchy and confluent areas of decreased attenuation are noted throughout the deep and periventricular white matter of the cerebral hemispheres bilaterally, compatible with chronic microvascular ischemic  disease. No evidence of acute infarction, hemorrhage, hydrocephalus, extra-axial collection or mass lesion/mass effect. Vascular: No hyperdense vessel or unexpected calcification. Skull: Normal. Negative for fracture or focal lesion. Sinuses/Orbits: No acute finding. Other: None. IMPRESSION: 1. No acute intracranial abnormalities. 2. Mild to moderate cerebral atrophy with extensive chronic microvascular ischemic changes in the cerebral white matter, similar to the prior examination.  Electronically Signed   By: Trudie Reed M.D.   On: 01/06/2017 13:13   Mr Maxine Glenn Neck W Wo Contrast  Result Date: 01/06/2017 CLINICAL DATA:  Initial evaluation for acute altered mental status. EXAM: MRI HEAD WITHOUT CONTRAST MRA HEAD WITHOUT CONTRAST MRA NECK WITHOUT AND WITH CONTRAST TECHNIQUE: Multiplanar, multiecho pulse sequences of the brain and surrounding structures were obtained without intravenous contrast. Angiographic images of the Circle of Willis were obtained using MRA technique without intravenous contrast. Angiographic images of the neck were obtained using MRA technique without and with intravenous contrast. Carotid stenosis measurements (when applicable) are obtained utilizing NASCET criteria, using the distal internal carotid diameter as the denominator. CONTRAST:  20 cc of MultiHance. COMPARISON:  Prior CT from earlier same day. FINDINGS: MRI HEAD FINDINGS Diffuse prominence of the CSF containing spaces compatible with generalized cerebral atrophy. Patchy and confluent T2/FLAIR hyperintensity within the periventricular and deep white matter both cerebral hemispheres most consistent with chronic micro vessel ischemic changes, mild to moderate nature. No abnormal foci of restricted diffusion to suggest acute or subacute ischemia. Gray-white matter differentiation maintained. No encephalomalacia to suggest remote cortical infarction. No acute or chronic intracranial hemorrhage. No mass lesion, midline shift or mass  effect. Diffuse ventricular prominence related to global parenchymal volume loss without hydrocephalus. No extra-axial fluid collection. Major dural sinuses grossly patent. Pituitary gland suprasellar region within normal limits. Midline structures intact and normal. Heterogeneous flow void within the left V4 segment, which may be related to slow flow and/ or occlusion. Similarly, poor flow void present within the basilar artery. Right vertebral artery appears patent. Remainder the intracranial vascular flow voids maintained. Craniocervical junction normal. Advanced degenerative spondylolysis noted within the upper cervical spine with associated severe stenosis at C3-4, grossly stable relative to recent cervical spine MRI from 10/25/2016. Bone marrow signal intensity within normal limits. No scalp soft tissue abnormality. Globes and orbital soft tissues within normal limits. Patient status post lens extraction bilaterally. Paranasal sinuses largely clear. Right mastoid effusion noted. Inner ear structures normal. MRA HEAD FINDINGS ANTERIOR CIRCULATION: Study degraded by motion artifact. Distal cervical segments of the internal carotid arteries are patent with antegrade flow. Petrous segments patent bilaterally. Scattered multifocal atheromatous irregularity within the cavernous/ supraclinoid ICAs with mild to moderate multifocal narrowing, right worse than left. No high-grade flow-limiting stenosis. ICA termini patent. Dominant left A1 segment widely patent. Right A1 segment hypoplastic and attenuated as compared to the left, which likely accounts for the diminutive right ICA as compared to the left. Patent anterior communicating artery. Anterior cerebral arteries patent to their distal aspects. M1 segments patent without stenosis or occlusion. No proximal M2 occlusion. Distal MCA branches demonstrate multifocal atheromatous irregularity, slightly worse on the right. POSTERIOR CIRCULATION: Atheromatous irregularity  throughout the right V4 segment which is patent to the vertebrobasilar junction without high-grade stenosis. Right PICA patent proximally. The left vertebral artery not visualize, likely occluded. There is filling of the left PICA and/or dominant left AICA, likely via retrograde flow. 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 Distal reconstitution at the basilar tip via flow from prominent bilateral posterior communicating arteries. Superior cerebral arteries are perfused and patent bilaterally. PCAs supplied via the posterior communicating arteries and are patent to their distal aspects. Short-segment severe distal right P3 stenosis noted (series 406, image 16). Additional small vessel atheromatous irregularity noted within the distal PCA branches bilaterally. No aneurysm. MRA NECK FINDINGS Source images reviewed. Visualized aortic arch of normal caliber with normal 3 vessel morphology. No flow-limiting  stenosis about the origin of the great vessels. Visualized subclavian artery is patent without stenosis. Right common carotid artery patent from its origin to the bifurcation without stenosis. Atheromatous irregularity about the right bifurcation without flow-limiting stenosis. Right ICA mildly tortuous but patent to the skullbase without stenosis or occlusion. Left common carotid artery tortuous proximally but widely patent to the carotid bifurcation. Atheromatous regularity about the left carotid bifurcation without stenosis. Left ICA tortuous but patent distally to the skullbase without stenosis or occlusion. Both of the vertebral arteries arise from the subclavian arteries. Right vertebral artery dominant. Tandem moderate to severe proximal/ right V1 stenoses (series 16109, image 14). Right vertebral artery otherwise patent to the skullbase without stenosis. Left vertebral artery diffusely hypoplastic with multifocal atheromatous irregularity throughout the V1 and V2 segments. Left vertebral artery  occludes as it courses into the cranial vault. IMPRESSION: MRI HEAD IMPRESSION: 1. No acute intracranial infarct or other abnormality. 2. Moderate cerebral atrophy with chronic microvascular ischemic disease. 3. 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 MRA HEAD IMPRESSION: 1. Occlusion of the proximal basilar artery. Distal reconstitution via flow from the anterior circulation via the posterior communicating arteries. PCAs are well perfused bilaterally. 2. Occluded left vertebral artery. 3. Additional moderate atheromatous irregularity throughout the intracranial circulation, primarily involving the carotid siphons and distal small vessels. MRA NECK IMPRESSION: 1. Patent carotid artery systems bilaterally without high-grade or critical flow limiting stenosis. 2. Tandem moderate to severe proximal right V1 stenoses. Dominant right vertebral artery otherwise patent within the neck. 3. Diffusely irregular hypoplastic left vertebral artery, which occludes at the skullbase. Electronically Signed   By: Rise Mu M.D.   On: 01/06/2017 22:56   Mr Brain Wo Contrast  Result Date: 01/06/2017 CLINICAL DATA:  Initial evaluation for acute altered mental status. EXAM: MRI HEAD WITHOUT CONTRAST MRA HEAD WITHOUT CONTRAST MRA NECK WITHOUT AND WITH CONTRAST TECHNIQUE: Multiplanar, multiecho pulse sequences of the brain and surrounding structures were obtained without intravenous contrast. Angiographic images of the Circle of Willis were obtained using MRA technique without intravenous contrast. Angiographic images of the neck were obtained using MRA technique without and with intravenous contrast. Carotid stenosis measurements (when applicable) are obtained utilizing NASCET criteria, using the distal internal carotid diameter as the denominator. CONTRAST:  20 cc of MultiHance. COMPARISON:  Prior CT from  earlier same day. FINDINGS: MRI HEAD FINDINGS Diffuse prominence of the CSF containing spaces compatible with generalized cerebral atrophy. Patchy and confluent T2/FLAIR hyperintensity within the periventricular and deep white matter both cerebral hemispheres most consistent with chronic micro vessel ischemic changes, mild to moderate nature. No abnormal foci of restricted diffusion to suggest acute or subacute ischemia. Gray-white matter differentiation maintained. No encephalomalacia to suggest remote cortical infarction. No acute or chronic intracranial hemorrhage. No mass lesion, midline shift or mass effect. Diffuse ventricular prominence related to global parenchymal volume loss without hydrocephalus. No extra-axial fluid collection. Major dural sinuses grossly patent. Pituitary gland suprasellar region within normal limits. Midline structures intact and normal. Heterogeneous flow void within the left V4 segment, which may be related to slow flow and/ or occlusion. Similarly, poor flow void present within the basilar artery. Right vertebral artery appears patent. Remainder the intracranial vascular flow voids maintained. Craniocervical junction normal. Advanced degenerative spondylolysis noted within the upper cervical spine with associated severe stenosis at C3-4, grossly stable relative to recent cervical spine MRI from 10/25/2016. Bone marrow signal intensity  within normal limits. No scalp soft tissue abnormality. Globes and orbital soft tissues within normal limits. Patient status post lens extraction bilaterally. Paranasal sinuses largely clear. Right mastoid effusion noted. Inner ear structures normal. MRA HEAD FINDINGS ANTERIOR CIRCULATION: Study degraded by motion artifact. Distal cervical segments of the internal carotid arteries are patent with antegrade flow. Petrous segments patent bilaterally. Scattered multifocal atheromatous irregularity within the cavernous/ supraclinoid ICAs with mild to  moderate multifocal narrowing, right worse than left. No high-grade flow-limiting stenosis. ICA termini patent. Dominant left A1 segment widely patent. Right A1 segment hypoplastic and attenuated as compared to the left, which likely accounts for the diminutive right ICA as compared to the left. Patent anterior communicating artery. Anterior cerebral arteries patent to their distal aspects. M1 segments patent without stenosis or occlusion. No proximal M2 occlusion. Distal MCA branches demonstrate multifocal atheromatous irregularity, slightly worse on the right. POSTERIOR CIRCULATION: Atheromatous irregularity throughout the right V4 segment which is patent to the vertebrobasilar junction without high-grade stenosis. Right PICA patent proximally. The left vertebral artery not visualize, likely occluded. There is filling of the left PICA and/or dominant left AICA, likely via retrograde flow. 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 Distal reconstitution at the basilar tip via flow from prominent bilateral posterior communicating arteries. Superior cerebral arteries are perfused and patent bilaterally. PCAs supplied via the posterior communicating arteries and are patent to their distal aspects. Short-segment severe distal right P3 stenosis noted (series 406, image 16). Additional small vessel atheromatous irregularity noted within the distal PCA branches bilaterally. No aneurysm. MRA NECK FINDINGS Source images reviewed. Visualized aortic arch of normal caliber with normal 3 vessel morphology. No flow-limiting stenosis about the origin of the great vessels. Visualized subclavian artery is patent without stenosis. Right common carotid artery patent from its origin to the bifurcation without stenosis. Atheromatous irregularity about the right bifurcation without flow-limiting stenosis. Right ICA mildly tortuous but patent to the skullbase without stenosis or occlusion. Left common carotid artery tortuous  proximally but widely patent to the carotid bifurcation. Atheromatous regularity about the left carotid bifurcation without stenosis. Left ICA tortuous but patent distally to the skullbase without stenosis or occlusion. Both of the vertebral arteries arise from the subclavian arteries. Right vertebral artery dominant. Tandem moderate to severe proximal/ right V1 stenoses (series 16109, image 14). Right vertebral artery otherwise patent to the skullbase without stenosis. Left vertebral artery diffusely hypoplastic with multifocal atheromatous irregularity throughout the V1 and V2 segments. Left vertebral artery occludes as it courses into the cranial vault. IMPRESSION: MRI HEAD IMPRESSION: 1. No acute intracranial infarct or other abnormality. 2. Moderate cerebral atrophy with chronic microvascular ischemic disease. 3. 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 MRA HEAD IMPRESSION: 1. Occlusion of the proximal basilar artery. Distal reconstitution via flow from the anterior circulation via the posterior communicating arteries. PCAs are well perfused bilaterally. 2. Occluded left vertebral artery. 3. Additional moderate atheromatous irregularity throughout the intracranial circulation, primarily involving the carotid siphons and distal small vessels. MRA NECK IMPRESSION: 1. Patent carotid artery systems bilaterally without high-grade or critical flow limiting stenosis. 2. Tandem moderate to severe proximal right V1 stenoses. Dominant right vertebral artery otherwise patent within the neck. 3. Diffusely irregular hypoplastic left vertebral artery, which occludes at the skullbase. Electronically Signed   By: Rise Mu M.D.   On: 01/06/2017 22:56   Dg Chest Portable 1 View  Result Date: 01/06/2017 CLINICAL  DATA:  AMS,HX HTN,DM EXAM: PORTABLE CHEST 1 VIEW COMPARISON:  07/08/2016 FINDINGS: Normal  mediastinum and cardiac silhouette. Normal pulmonary vasculature. No evidence of effusion, infiltrate, or pneumothorax. No acute bony abnormality. IMPRESSION: No acute cardiopulmonary process. Electronically Signed   By: Genevive Bi M.D.   On: 01/06/2017 13:28   Mr Maxine Glenn Head Wo Contrast  Result Date: 01/06/2017 CLINICAL DATA:  Initial evaluation for acute altered mental status. EXAM: MRI HEAD WITHOUT CONTRAST MRA HEAD WITHOUT CONTRAST MRA NECK WITHOUT AND WITH CONTRAST TECHNIQUE: Multiplanar, multiecho pulse sequences of the brain and surrounding structures were obtained without intravenous contrast. Angiographic images of the Circle of Willis were obtained using MRA technique without intravenous contrast. Angiographic images of the neck were obtained using MRA technique without and with intravenous contrast. Carotid stenosis measurements (when applicable) are obtained utilizing NASCET criteria, using the distal internal carotid diameter as the denominator. CONTRAST:  20 cc of MultiHance. COMPARISON:  Prior CT from earlier same day. FINDINGS: MRI HEAD FINDINGS Diffuse prominence of the CSF containing spaces compatible with generalized cerebral atrophy. Patchy and confluent T2/FLAIR hyperintensity within the periventricular and deep white matter both cerebral hemispheres most consistent with chronic micro vessel ischemic changes, mild to moderate nature. No abnormal foci of restricted diffusion to suggest acute or subacute ischemia. Gray-white matter differentiation maintained. No encephalomalacia to suggest remote cortical infarction. No acute or chronic intracranial hemorrhage. No mass lesion, midline shift or mass effect. Diffuse ventricular prominence related to global parenchymal volume loss without hydrocephalus. No extra-axial fluid collection. Major dural sinuses grossly patent. Pituitary gland suprasellar region within normal limits. Midline structures intact and normal. Heterogeneous flow void  within the left V4 segment, which may be related to slow flow and/ or occlusion. Similarly, poor flow void present within the basilar artery. Right vertebral artery appears patent. Remainder the intracranial vascular flow voids maintained. Craniocervical junction normal. Advanced degenerative spondylolysis noted within the upper cervical spine with associated severe stenosis at C3-4, grossly stable relative to recent cervical spine MRI from 10/25/2016. Bone marrow signal intensity within normal limits. No scalp soft tissue abnormality. Globes and orbital soft tissues within normal limits. Patient status post lens extraction bilaterally. Paranasal sinuses largely clear. Right mastoid effusion noted. Inner ear structures normal. MRA HEAD FINDINGS ANTERIOR CIRCULATION: Study degraded by motion artifact. Distal cervical segments of the internal carotid arteries are patent with antegrade flow. Petrous segments patent bilaterally. Scattered multifocal atheromatous irregularity within the cavernous/ supraclinoid ICAs with mild to moderate multifocal narrowing, right worse than left. No high-grade flow-limiting stenosis. ICA termini patent. Dominant left A1 segment widely patent. Right A1 segment hypoplastic and attenuated as compared to the left, which likely accounts for the diminutive right ICA as compared to the left. Patent anterior communicating artery. Anterior cerebral arteries patent to their distal aspects. M1 segments patent without stenosis or occlusion. No proximal M2 occlusion. Distal MCA branches demonstrate multifocal atheromatous irregularity, slightly worse on the right. POSTERIOR CIRCULATION: Atheromatous irregularity throughout the right V4 segment which is patent to the vertebrobasilar junction without high-grade stenosis. Right PICA patent proximally. The left vertebral artery not visualize, likely occluded. There is filling of the left PICA and/or dominant left AICA, likely via retrograde flow. Abrupt  occlusion of the basilar artery proximally. Distal reconstitution at the basilar tip via flow from prominent bilateral posterior communicating arteries. Superior cerebral arteries are perfused and patent bilaterally. PCAs supplied via the posterior communicating arteries and are patent to their distal aspects. Short-segment severe distal right P3 stenosis noted (  series 406, image 16). Additional small vessel atheromatous irregularity noted within the distal PCA branches bilaterally. No aneurysm. MRA NECK FINDINGS Source images reviewed. Visualized aortic arch of normal caliber with normal 3 vessel morphology. No flow-limiting stenosis about the origin of the great vessels. Visualized subclavian artery is patent without stenosis. Right common carotid artery patent from its origin to the bifurcation without stenosis. Atheromatous irregularity about the right bifurcation without flow-limiting stenosis. Right ICA mildly tortuous but patent to the skullbase without stenosis or occlusion. Left common carotid artery tortuous proximally but widely patent to the carotid bifurcation. Atheromatous regularity about the left carotid bifurcation without stenosis. Left ICA tortuous but patent distally to the skullbase without stenosis or occlusion. Both of the vertebral arteries arise from the subclavian arteries. Right vertebral artery dominant. Tandem moderate to severe proximal/ right V1 stenoses (series 16109, image 14). Right vertebral artery otherwise patent to the skullbase without stenosis. Left vertebral artery diffusely hypoplastic with multifocal atheromatous irregularity throughout the V1 and V2 segments. Left vertebral artery occludes as it courses into the cranial vault. IMPRESSION: MRI HEAD IMPRESSION: 1. No acute intracranial infarct or other abnormality. 2. Moderate cerebral atrophy with chronic microvascular ischemic disease. 3. Advanced degenerative spondylolysis within the upper cervical spine with associated  severe stenosis at C3-4, better evaluated on recent cervical spine MRI. MRA HEAD IMPRESSION: 1. Occlusion of the proximal basilar artery. Distal reconstitution via flow from the anterior circulation via the posterior communicating arteries. PCAs are well perfused bilaterally. 2. Occluded left vertebral artery. 3. Additional moderate atheromatous irregularity throughout the intracranial circulation, primarily involving the carotid siphons and distal small vessels. MRA NECK IMPRESSION: 1. Patent carotid artery systems bilaterally without high-grade or critical flow limiting stenosis. 2. Tandem moderate to severe proximal right V1 stenoses. Dominant right vertebral artery otherwise patent within the neck. 3. Diffusely irregular hypoplastic left vertebral artery, which occludes at the skullbase. Electronically Signed   By: Rise Mu M.D.   On: 01/06/2017 22:56    Lab Data:  CBC:  Recent Labs Lab 01/06/17 1238 01/06/17 1249 01/07/17 0946  WBC 8.2  --  8.0  NEUTROABS 4.1  --   --   HGB 13.8 14.6 12.4*  HCT 41.8 43.0 37.3*  MCV 80.9  --  80.6  PLT 151  --  139*   Basic Metabolic Panel:  Recent Labs Lab 01/06/17 1238 01/06/17 1249 01/07/17 0946  NA 138 142 138  K 3.6 3.7 3.3*  CL 108 107 108  CO2 26  --  22  GLUCOSE 108* 110* 164*  BUN 9 13 10   CREATININE 1.14 1.10 1.01  CALCIUM 9.4  --  8.7*   GFR: Estimated Creatinine Clearance: 63.8 mL/min (by C-G formula based on SCr of 1.01 mg/dL). Liver Function Tests:  Recent Labs Lab 01/06/17 1238  AST 19  ALT 15*  ALKPHOS 96  BILITOT 0.8  PROT 7.2  ALBUMIN 3.6   No results for input(s): LIPASE, AMYLASE in the last 168 hours. No results for input(s): AMMONIA in the last 168 hours. Coagulation Profile:  Recent Labs Lab 01/06/17 1238  INR 1.08   Cardiac Enzymes:  Recent Labs Lab 01/06/17 1614  TROPONINI <0.03   BNP (last 3 results) No results for input(s): PROBNP in the last 8760 hours. HbA1C:  Recent  Labs  01/06/17 1629  HGBA1C 7.7*   CBG:  Recent Labs Lab 01/06/17 1243 01/06/17 1244 01/06/17 2140 01/07/17 0609 01/07/17 1145  GLUCAP 102* 116* 135* 112* 207*   Lipid  Profile:  Recent Labs  01/07/17 0554  CHOL 158  HDL 31*  LDLCALC 97  TRIG 161  CHOLHDL 5.1   Thyroid Function Tests: No results for input(s): TSH, T4TOTAL, FREET4, T3FREE, THYROIDAB in the last 72 hours. Anemia Panel: No results for input(s): VITAMINB12, FOLATE, FERRITIN, TIBC, IRON, RETICCTPCT in the last 72 hours. Urine analysis:    Component Value Date/Time   COLORURINE YELLOW 01/07/2017 0012   APPEARANCEUR HAZY (A) 01/07/2017 0012   LABSPEC 1.016 01/07/2017 0012   PHURINE 5.0 01/07/2017 0012   GLUCOSEU NEGATIVE 01/07/2017 0012   HGBUR SMALL (A) 01/07/2017 0012   BILIRUBINUR NEGATIVE 01/07/2017 0012   BILIRUBINUR n 03/26/2015 1025   KETONESUR NEGATIVE 01/07/2017 0012   PROTEINUR NEGATIVE 01/07/2017 0012   UROBILINOGEN 0.2 03/26/2015 1025   UROBILINOGEN 0.2 05/29/2013 0216   NITRITE NEGATIVE 01/07/2017 0012   LEUKOCYTESUR LARGE (A) 01/07/2017 0012     Ripudeep Rai M.D. [facility] 01/07/2017, 1:17 PM  Pager: [phone] Between 7am to 7pm - call Pager - [phone]  After 7pm go to [email or web address] - password [email or web address]  Call night coverage person covering after 7pm

## 2017-01-07 NOTE — Progress Notes (Addendum)
STROKE TEAM PROGRESS NOTE   HISTORY OF PRESENT ILLNESS (per record) William Aguirre is an 82 y.o. male with dementia, hypertension, spinal cord compression, follow by Dr. Anne Hahn, who presented with expressive aphasia and confusion.  Yesterday night at 2115 he was noted to have expressive aphasia and agraphia. Pt was taken to [facility] but pt refused care because he wanted to go to [facility]. Pt continued to have confusion this morning. Pt is A&O x 3, disoriented to situation. He knows he is in the ED but unable to give a history and does not feel his speech is off. The daughter has a phone recording which clearly shows expressive aphasia. At current time he has dysarthria and slight right NL fold decrease.  Pt currently has a UTI.  Date last known well: Date: 01/05/2017 Time last known well: Time: 21:15 NIHSS 2 Modified Rankin: Rankin Score=0  Patient was not administered IV t-PA secondary to low NIHSS/presenting with mild symptoms. He was admitted to General Neurology for further evaluation and treatment.   SUBJECTIVE (INTERVAL HISTORY) His daughter is at the bedside.  The pt is awake and alert, and follows all commands appropriately.  Some hesitancy in word-finding noted.  Unclear whether this is baseline with his history of dementia or due to acute UTI.  UA with large amount of leukocytes.  Urine culture pending.  Treated with IV ceftriaxone.   OBJECTIVE Temp:  [98.1 F (36.7 C)-98.4 F (36.9 C)] 98.1 F (36.7 C) (09/27 0948) Pulse Rate:  [58-64] 62 (09/27 0948) Cardiac Rhythm: Normal sinus rhythm (09/27 0700) Resp:  [12-21] 18 (09/27 0948) BP: (123-170)/(69-96) 145/70 (09/27 0948) SpO2:  [95 %-100 %] 95 % (09/27 0948) Weight:  [90.7 kg (199 lb 15.3 oz)] 90.7 kg (199 lb 15.3 oz) (09/26 2214)  CBC:   Recent Labs Lab 01/06/17 1238 01/06/17 1249 01/07/17 0946  WBC 8.2  --  8.0  NEUTROABS 4.1  --   --   HGB 13.8 14.6 12.4*  HCT 41.8 43.0 37.3*  MCV 80.9  --  80.6  PLT  151  --  139*    Basic Metabolic Panel:   Recent Labs Lab 01/06/17 1238 01/06/17 1249 01/07/17 0946  NA 138 142 138  K 3.6 3.7 3.3*  CL 108 107 108  CO2 26  --  22  GLUCOSE 108* 110* 164*  BUN CREATININE 1.14 1.10 1.01  CALCIUM 9.4  --  8.7*    Lipid Panel:     Component Value Date/Time   CHOL 158 01/07/2017 0554   TRIG 148 01/07/2017 0554   HDL 31 (L) 01/07/2017 0554   CHOLHDL 5.1 01/07/2017 0554   VLDL 30 01/07/2017 0554   LDLCALC 97 01/07/2017 0554   HgbA1c:  Lab Results  Component Value Date   HGBA1C 7.7 (H) 01/06/2017   Urine Drug Screen:     Component Value Date/Time   LABOPIA NONE DETECTED 01/07/2017 0011   COCAINSCRNUR NONE DETECTED 01/07/2017 0011   LABBENZ NONE DETECTED 01/07/2017 0011   AMPHETMU NONE DETECTED 01/07/2017 0011   THCU NONE DETECTED 01/07/2017 0011   LABBARB NONE DETECTED 01/07/2017 0011    Alcohol Level     Component Value Date/Time   ETH <10 01/06/2017 1304    IMAGING I have personally reviewed the radiological images below and agree with the radiology interpretations.  Ct Head Wo Contrast 01/06/2017 IMPRESSION: 1. No acute intracranial abnormalities. 2. Mild to moderate cerebral atrophy with extensive chronic microvascular ischemic  changes in the cerebral white matter, similar to the prior examination. Electronically Signed   By: Trudie Reed M.D.   On: 01/06/2017 13:13   Mr Maxine Glenn Neck W Wo Contrast 01/06/2017 MRA NECK IMPRESSION: 1. Patent carotid artery systems bilaterally without high-grade or critical flow limiting stenosis. 2. Tandem moderate to severe proximal right V1 stenoses. Dominant right vertebral artery otherwise patent within the neck. 3. Diffusely irregular hypoplastic left vertebral artery, which occludes at the skullbase.   Mr Brain Wo Contrast 01/06/2017 MRI HEAD IMPRESSION: 1. No acute intracranial infarct or other abnormality. 2. Moderate cerebral atrophy with chronic microvascular ischemic disease.  3. 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  Mr Maxine Glenn Head Wo Contrast 01/06/2017 IMPRESSION:  MRA HEAD IMPRESSION: 1. Occlusion of the proximal basilar artery. Distal reconstitution via flow from the anterior circulation via the posterior communicating arteries. PCAs are well perfused bilaterally. 2. Occluded left vertebral artery. 3. Additional moderate atheromatous irregularity throughout the intracranial circulation, primarily involving the carotid siphons and distal small vessels.  Dg Chest Portable 1 View 01/06/2017  IMPRESSION: No acute cardiopulmonary process.   TTE 01/07/2017 - Left ventricle: The cavity size was normal. Wall thickness was   normal. Systolic function was normal. The estimated ejection   fraction was in the range of 55% to 60%. Wall motion was normal;   there were no regional wall motion abnormalities. Doppler   parameters are consistent with abnormal left ventricular   relaxation (grade 1 diastolic dysfunction).    PHYSICAL EXAM  Temp:  [97.6 F (36.4 C)-98.4 F (36.9 C)] 97.6 F (36.4 C) (09/27 1646) Pulse Rate:  [58-64] 64 (09/27 1646) Resp:  [12-21] 20 (09/27 1646) BP: (123-170)/(69-96) 152/71 (09/27 1646) SpO2:  [95 %-100 %] 98 % (09/27 1646) Weight:  [199 lb 15.3 oz (90.7 kg)] 199 lb 15.3 oz (90.7 kg) (09/26 2214)  General - Well nourished, well developed, in no apparent distress.  Ophthalmologic - Fundi not visualized due to noncooperation.  Cardiovascular - Regular rate and rhythm.  Mental Status -  Level of arousal and orientation to time, place, and person were intact. Language including expression, naming, repetition, comprehension was assessed and found intact. Fund of knowledge impaired  Cranial Nerves II - XII - II - Visual field intact OU. III, IV, VI - Extraocular movements intact. V - Facial sensation intact  bilaterally. VII - Facial movement intact bilaterally. VIII - Hearing & vestibular intact bilaterally. X - Palate elevates symmetrically. XI - Chin turning & shoulder shrug intact bilaterally. XII - Tongue protrusion intact.  Motor Strength - The patient's strength was normal in all extremities and pronator drift was absent.  Bulk was normal and fasciculations were absent.   Motor Tone - Muscle tone was assessed at the neck and appendages and was normal.  Reflexes - The patient's reflexes were 1+ in all extremities and he had no pathological reflexes.  Sensory - Light touch, temperature/pinprick were assessed and were symmetrical.    Coordination - The patient had normal movements in the hands with no ataxia or dysmetria.  Tremor was absent.  Gait and Station - deferred   ASSESSMENT/PLAN William Aguirre is a 82 y.o. male with history of  dementia, hypertension, spinal cord compression, follow by Dr. Anne Hahn, who presented with expressive aphasia and confusion.  He did not receive IV t-PA due to presenting with low NIHSS/mild symptoms.   Encephalopathy secondary to  UTI in the setting of dementia vs TIA due to severe intracranial atherosclerosis  Resultant  at baseline  CT head: No acute stroke  MRI head: No acute stroke  MRA head: Occluded possible BA, distal reconstitution retrograde flow from PCOM. Occluded L VA.  Moderate intracranial atherosclerosis including bilateral siphons and distal small vessels  MRA neck: Tandem moderate to severe proximal R V1 stenoses.  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  2D Echo: EF 55-60%  LDL 97  HgbA1c 7.7  SCDs for VTE prophylaxis Diet Carb Modified Fluid consistency: Thin; Room service appropriate? Yes  clopidogrel 75 mg daily prior to admission, now on aspirin 81 mg daily and clopidogrel 75 mg daily. Continue DAPT for 3 months is and then Plavix alone.  Patient counseled to be compliant with his  antithrombotic medications  Ongoing aggressive stroke risk factor management  Therapy recommendations: SNF  Disposition: pending  Cervical spinal cord compression  MRI C-spine in 10/2016 showed cervical spine C3-C5 spinal cord compression  Symptomatically with gait disability and falls  Follows with Dr. Anne Hahn at [facility]  UTI  UA WBC TNTC  treated with IV ceftriaxone  Hypertension  Stable  Permissive hypertension (OK if < 220/120) but gradually normalize in 5-7 days  Long-term BP goal 130 t0 150 due to BA occlusion and severe intracranial stenosis  Hyperlipidemia  Home meds: atorvastatin 80 mg PO daily, resumed in hospital  LDL 97, goal < 70  Continue high-dose statin at discharge  Diabetes  HgbA1c 7.7, goal < 7.0  Uncontrolled  On Lantus  SSI  Close PCP follow-up for better DM control  Other Stroke Risk Factors  Advanced age  Obesity, Body mass index is 30.4 kg/m., recommend weight loss, diet and exercise as appropriate   CAD  Other Active Problems  Dementia - on Aricept  Hospital day # 0  Neurology will sign off. Please call with questions. Pt will follow up with Dr. Nechama Guard NP at [facility] on 02/23/17. Thanks for the consult.  Marvel Plan, MD PhD Stroke Neurology 01/07/2017 5:28 PM   To contact Stroke Continuity provider, please refer to [email or web address]. After hours, contact General Neurology

## 2017-01-07 NOTE — Clinical Social Work Note (Signed)
Clinical Social Work Assessment  Patient Details  Name: William Aguirre MRN: [id number] Date of Birth: 03/12/1935  Date of referral:                  Reason for consult:  Facility Placement                Permission sought to share information with:  Facility Contact Representative, Family Supports Permission granted to share information::  Yes, Verbal Permission Granted  Name::     [name]  Agency::  SNF  Relationship::  Daughter  Contact Information:     Housing/Transportation Living arrangements for the past 2 months:  Single Family Home Source of Information:  Adult Children Patient Interpreter Needed:  None Criminal Activity/Legal Involvement Pertinent to Current Situation/Hospitalization:  No - Comment as needed Significant Relationships:  Adult Children, Other Family Members Lives with:  Self, Adult Children, Other (Comment) (grandchildren) Do you feel safe going back to the place where you live?  Yes Need for family participation in patient care:  Yes (Comment) (patient has dementia, not oriented)  Care giving concerns:  Patient has been living with his daughter, [name], after his wife kicked him out in August. Patient's daughter is not able to provide care for the patient, as she is also a full time student and has three teenage children. Patient's daughter has been looking into a long term care option for the patient, and would be interested in SNF for rehab if recommended to give her some additional time to locate a placement for the patient for long term care.   Social Worker assessment / plan:  CSW met with patient's daughter, [name], per RN request, to discuss discharge planning and patient's daughters concerns with locating a safe, stable place for the patient to live long term. Patient's daughter discussed that she had been touring facilities and is aware that she will have to private pay as a spend down before the Medicaid application can begin for long term care.  CSW explained options available at discharge, and patient's daughter requested [facility], if available. CSW to fax out referral and follow up with bed offers.   Employment status:  Retired Insurance information:  Managed Medicare PT Recommendations:  Skilled Nursing Facility Information / Referral to community resources:  Skilled Nursing Facility  Patient/Family's Response to care:  Patient's daughter is agreeable to SNF placement.  Patient/Family's Understanding of and Emotional Response to Diagnosis, Current Treatment, and Prognosis:  Patient's daughter discussed that she was very overwhelmed with the process of looking for care for the patient. Patient's daughter complained about how all of this has been dumped on her lap after the patient was kicked out by his wife, and her siblings live way outside of town so they are not able to help with the day to day tasks. Patient's daughter seemed relieved that CSW could assist with locating short term placement at discharge, and indicated understanding of role and discharge process. Patient's daughter appreciated assistance in working with her dad.  Emotional Assessment Appearance:  Appears stated age Attitude/Demeanor/Rapport:  Unable to Assess Affect (typically observed):  Unable to Assess Orientation:  Oriented to Self, Oriented to Place Alcohol / Substance use:  Not Applicable Psych involvement (Current and /or in the community):  No (Comment)  Discharge Needs  Concerns to be addressed:  Care Coordination Readmission within the last 30 days:  No Current discharge risk:  Physical Impairment, Cognitively Impaired Barriers to Discharge:  Continued Medical Work up, Insurance Authorization      M , LCSW 01/07/2017, 12:24 PM  

## 2017-01-07 NOTE — Telephone Encounter (Signed)
Ok to refill for 30 days  

## 2017-01-07 NOTE — NC FL2 (Signed)
[place] MEDICAID FL2 LEVEL OF CARE SCREENING TOOL     IDENTIFICATION  Patient Name: William Aguirre Birthdate: 1935-02-19 Sex: male Admission Date (Current Location): 01/06/2017  [facility] and [place] Number:  Producer, television/film/video and Address:  [facility] [place], [address] [place], [place] [address]      Provider Number: [id number]  Attending Physician Name and Address:  Cathren Harsh, MD  Relative Name and Phone Number:       Current Level of Care: Hospital Recommended Level of Care: Skilled Nursing Facility Prior Approval Number:    Date Approved/Denied:   PASRR Number: [id number]  Discharge Plan: SNF    Current Diagnoses: Patient Active Problem List   Diagnosis Date Noted  . TIA (transient ischemic attack) 01/07/2017  . UTI (urinary tract infection) 01/07/2017  . Stroke-like symptoms 01/06/2017  . Right knee pain 07/08/2016  . Unable to bear weight 07/08/2016  . Hypoglycemia 07/08/2016  . Upper airway cough syndrome 01/31/2015  . Dyspnea 01/31/2015  . Special screening for malignant neoplasms, colon 06/01/2013  . Mild cognitive impairment with memory loss 06/01/2013  . Unsteady gait 05/08/2013  . Hyperlipidemia 08/18/2008  . Essential hypertension 08/18/2008  . INTRAOCULAR LENS IMPLANT, HX OF 08/18/2008  . CATARACT EXTRACTION, RIGHT EYE, HX OF 08/18/2008  . Diabetes mellitus (HCC) 08/17/2008  . CORONARY ATHEROSCLEROSIS NATIVE CORONARY ARTERY 08/13/2008    Orientation RESPIRATION BLADDER Height & Weight     Self, Place  Normal Continent Weight: 199 lb 15.3 oz (90.7 kg) Height:   (172.7 cm)  BEHAVIORAL SYMPTOMS/MOOD NEUROLOGICAL BOWEL NUTRITION STATUS      Continent Diet (carb modified)  AMBULATORY STATUS COMMUNICATION OF NEEDS Skin   Limited Assist Verbally Normal                       Personal Care Assistance Level of Assistance  Bathing, Dressing Bathing Assistance: Limited assistance   Dressing Assistance:  Limited assistance     Functional Limitations Info             SPECIAL CARE FACTORS FREQUENCY  PT (By licensed PT), OT (By licensed OT)     PT Frequency: 5x/wk OT Frequency: 5x/wk            Contractures      Additional Factors Info  Code Status, Allergies, Insulin Sliding Scale Code Status Info: Full Allergies Info: Hydrocodone-acetaminophen   Insulin Sliding Scale Info: 0-9 units 3x/day with meals; Lantus 15 units every morning       Current Medications (01/07/2017):  This is the current hospital active medication list Current Facility-Administered Medications  Medication Dose Route Frequency Provider Last Rate Last Dose  . 0.9 %  sodium chloride infusion   Intravenous Continuous Marcos Eke, PA-C 75 mL/hr at 01/06/17 2346    . aspirin EC tablet 81 mg  81 mg Oral Daily Marcos Eke, PA-C   81 mg at 01/07/17 1025  . atorvastatin (LIPITOR) tablet 80 mg  80 mg Oral Daily Marcos Eke, PA-C   80 mg at 01/07/17 1026  . bisoprolol (ZEBETA) tablet 5 mg  5 mg Oral BID Marcos Eke, PA-C   5 mg at 01/07/17 1026  . cefTRIAXone (ROCEPHIN) 1 g in dextrose 5 % 50 mL IVPB  1 g Intravenous Q24H Rai, Ripudeep K, MD      . clopidogrel (PLAVIX) tablet 75 mg  75 mg Oral Daily Marcos Eke, PA-C  75 mg at 01/07/17 1025  . donepezil (ARICEPT) tablet 5 mg  5 mg Oral QHS Marcos Eke, PA-C   5 mg at 01/06/17 2334  . gadobenate dimeglumine (MULTIHANCE) injection 20 mL  20 mL Intravenous Once PRN Ozella Rocks, MD      . heparin injection 5,000 Units  5,000 Units Subcutaneous Q8H Marcos Eke, PA-C   5,000 Units at 01/07/17 1430  . hydrALAZINE (APRESOLINE) injection 5-10 mg  5-10 mg Intravenous Q8H PRN Marlowe Kays E, PA-C      . insulin aspart (novoLOG) injection 0-9 Units  0-9 Units Subcutaneous TID WC Marcos Eke, PA-C   3 Units at 01/07/17 1202  . insulin glargine (LANTUS) injection 15 Units  15 Units Subcutaneous Jenene Slicker, [place]   15 Units at  01/07/17 0636  . Melatonin TABS 3 mg  3 mg Oral QHS PRN Marcos Eke, PA-C      . pantoprazole (PROTONIX) EC tablet 40 mg  40 mg Oral Daily Marlowe Kays E, PA-C   40 mg at 01/07/17 1026  . senna-docusate (Senokot-S) tablet 1 tablet  1 tablet Oral QHS PRN Marcos Eke, PA-C      . traMADol Janean Sark) tablet 50 mg  50 mg Oral BID PRN Marcos Eke, PA-C         Discharge Medications: Please see discharge summary for a list of discharge medications.  Relevant Imaging Results:  Relevant Lab Results:   Additional Information SS#: [id number]  Baldemar Lenis, LCSW

## 2017-01-07 NOTE — Progress Notes (Signed)
Pt admitted from ED with stroke diagnosis, alert and oriented but forgetful, hx of dementia, pt settled in bed with call light at bedside, tele monitor put and verified on pt, v/s stable, safety concern addressed, will continue to monitor. Obasogie-Asidi, Mollie Rossano Efe

## 2017-01-07 NOTE — Evaluation (Signed)
Physical Therapy Evaluation Patient Details Name: William Aguirre MRN: [id number] DOB: June 17, 1934 Today's Date: 01/07/2017   History of Present Illness  Pt is an 82 y/o male admitted secondary to dysarthria. MRI was negative. PMH including but not limited to mild dementia, HTN, HLD, CAD and DM.  Clinical Impression  Pt presented supine in bed with HOB elevated, awake and willing to participate in therapy session. Prior to admission, pt reported that he ambulates with use of RW and requires assistance for ADLs. Pt stated that he has a nurse that comes to his home Monday through Saturday from 10:00 am to 5:00 pm. He lives with his sister who is at home when the nurse is not present. Pt also reported that he receives [facility] PT services. Pt currently requires min A for transfers and min A for stability with ambulation using a RW. Pt would continue to benefit from skilled physical therapy services at this time while admitted and after d/c to address the below listed limitations in order to improve overall safety and independence with functional mobility.     Follow Up Recommendations Home health PT;Supervision/Assistance - 24 hour    Equipment Recommendations  None recommended by PT    Recommendations for Other Services       Precautions / Restrictions Precautions Precautions: Fall Restrictions Weight Bearing Restrictions: No      Mobility  Bed Mobility Overal bed mobility: Needs Assistance Bed Mobility: Supine to Sit;Sit to Supine     Supine to sit: Min guard Sit to supine: Min guard   General bed mobility comments: increased time and effort, use of bed rails, min guard for safety  Transfers Overall transfer level: Needs assistance Equipment used: Rolling walker (2 wheeled) Transfers: Sit to/from Stand Sit to Stand: Min assist         General transfer comment: increased time and effort, multiple attempts and assist to power into standing from  EOB  Ambulation/Gait Ambulation/Gait assistance: Min assist Ambulation Distance (Feet): 20 Feet Assistive device: Rolling walker (2 wheeled) Gait Pattern/deviations: Step-through pattern;Decreased step length - right;Decreased step length - left;Decreased stride length Gait velocity: decreased Gait velocity interpretation: Below normal speed for age/gender General Gait Details: assist for stability with use of RW, pt with poor safety awareness and difficulty navigating around obstacles  Stairs            Wheelchair Mobility    Modified Rankin (Stroke Patients Only)       Balance Overall balance assessment: Needs assistance Sitting-balance support: Feet supported Sitting balance-Leahy Scale: Good     Standing balance support: During functional activity;Bilateral upper extremity supported Standing balance-Leahy Scale: Poor Standing balance comment: pt reliant on bilateral UEs on RW                             Pertinent Vitals/Pain Pain Assessment: No/denies pain    Home Living Family/patient expects to be discharged to:: Private residence Living Arrangements: Other relatives;Other (Comment) (sister William Aguirre)) Available Help at Discharge: Family;Personal care attendant;Available 24 hours/day Type of Home: House Home Access: Stairs to enter Entrance Stairs-Rails: Right;Left;Can reach both Entrance Stairs-Number of Steps: 3 Home Layout: One level Home Equipment: Bedside commode;Walker - 2 wheels Additional Comments: information provided by pt, no family/caregivers present to verify    Prior Function Level of Independence: Needs assistance   Gait / Transfers Assistance Needed: ambulates with RW  ADL's / Homemaking Assistance Needed: pt reported that he has an Engineer, civil (consulting)  that comes Monday through Saturday from 10:00am to 5:00pm and assists him with ADLs        Hand Dominance        Extremity/Trunk Assessment   Upper Extremity Assessment Upper  Extremity Assessment: Defer to OT evaluation    Lower Extremity Assessment Lower Extremity Assessment: Generalized weakness       Communication   Communication: No difficulties  Cognition Arousal/Alertness: Awake/alert Behavior During Therapy: WFL for tasks assessed/performed Overall Cognitive Status: No family/caregiver present to determine baseline cognitive functioning Area of Impairment: Following commands;Safety/judgement;Problem solving                       Following Commands: Follows one step commands with increased time;Follows one step commands consistently Safety/Judgement: Decreased awareness of safety;Decreased awareness of deficits   Problem Solving: Decreased initiation;Difficulty sequencing;Requires verbal cues        General Comments      Exercises     Assessment/Plan    PT Assessment Patient needs continued PT services  PT Problem List Decreased strength;Decreased balance;Decreased mobility;Decreased coordination;Decreased knowledge of use of DME;Decreased cognition;Decreased safety awareness;Decreased knowledge of precautions       PT Treatment Interventions DME instruction;Gait training;Stair training;Functional mobility training;Therapeutic activities;Therapeutic exercise;Balance training;Neuromuscular re-education;Cognitive remediation;Patient/family education    PT Goals (Current goals can be found in the Care Plan section)  Acute Rehab PT Goals Patient Stated Goal: return home PT Goal Formulation: With patient Time For Goal Achievement: 01/21/17 Potential to Achieve Goals: Good    Frequency Min 3X/week   Barriers to discharge        Co-evaluation               AM-PAC PT "6 Clicks" Daily Activity  Outcome Measure Difficulty turning over in bed (including adjusting bedclothes, sheets and blankets)?: A Little Difficulty moving from lying on back to sitting on the side of the bed? : A Lot Difficulty sitting down on and  standing up from a chair with arms (e.g., wheelchair, bedside commode, etc,.)?: Unable Help needed moving to and from a bed to chair (including a wheelchair)?: A Little Help needed walking in hospital room?: A Little Help needed climbing 3-5 steps with a railing? : A Little 6 Click Score: 15    End of Session Equipment Utilized During Treatment: Gait belt Activity Tolerance: Patient tolerated treatment well Patient left: in bed;with call bell/phone within reach;with bed alarm set Nurse Communication: Mobility status PT Visit Diagnosis: Unsteadiness on feet (R26.81);Other abnormalities of gait and mobility (R26.89)    Time: 4098-1191 PT Time Calculation (min) (ACUTE ONLY): 18 min   Charges:   PT Evaluation $PT Eval Moderate Complexity: 1 Mod     PT G Codes:   PT G-Codes **NOT FOR INPATIENT CLASS** Functional Assessment Tool Used: AM-PAC 6 Clicks Basic Mobility;Clinical judgement Functional Limitation: Mobility: Walking and moving around Mobility: Walking and Moving Around Current Status (Y7829): At least 40 percent but less than 60 percent impaired, limited or restricted Mobility: Walking and Moving Around Goal Status [phone]): At least 1 percent but less than 20 percent impaired, limited or restricted    [facility], [place], DPT [phone]   Alessandra Bevels Kieren Ricci 01/07/2017, 9:44 AM

## 2017-01-07 NOTE — Progress Notes (Signed)
SLP Cancellation Note  Patient Details Name: William Aguirre MRN: [id number] DOB: 04-Mar-1935   Cancelled treatment:       Reason Eval/Treat Not Completed: SLP screened, no needs identified, will sign off  Speech is currently at baseline and daughter has 24 hour supervision at home prior to this admission d/t dementia dx. No acute deficits noted. ST to sign off.    [name] 01/07/2017, 12:21 PM

## 2017-01-07 NOTE — Care Management Obs Status (Signed)
MEDICARE OBSERVATION STATUS NOTIFICATION   Patient Details  Name: William Aguirre MRN: [id number] Date of Birth: 1935-03-10   Medicare Observation Status Notification Given:  Yes    Kermit Balo, RN 01/07/2017, 4:12 PM

## 2017-01-07 NOTE — Telephone Encounter (Signed)
Sent to the pharmacy by e-scribe. 

## 2017-01-07 NOTE — Progress Notes (Signed)
Occupational Therapy Evaluation Patient Details Name: William Aguirre MRN: [id number] DOB: January 31, 1935 Today's Date: 01/07/2017    History of Present Illness Pt is an 82 y/o male admitted secondary to dysarthria. MRI was negative. PMH including but not limited to mild dementia, HTN, HLD, CAD and DM.   Clinical Impression   PTA, pt lived at home with his daughter and had assistance from caregivers for ADL as needed during the day. Pt needs 24/7 S due to apparent safety concerns. Feel pt would benefit form rehab at SNF to maximize functional level of independence to facilitate safe DC home.     Follow Up Recommendations  SNF    Equipment Recommendations  None recommended by OT    Recommendations for Other Services       Precautions / Restrictions Precautions Precautions: Fall Restrictions Weight Bearing Restrictions: No      Mobility Bed Mobility Overal bed mobility: Needs Assistance Bed Mobility: Supine to Sit;Sit to Supine     Supine to sit: Supervision        Transfers Overall transfer level: Needs assistance Equipment used: Rolling walker (2 wheeled) Transfers: Sit to/from Stand Sit to Stand: Min assist         General transfer comment: increased time and effort, multiple attempts and assist to power into standing from EOB    Balance Overall balance assessment: Needs assistance Sitting-balance support: Feet supported Sitting balance-Leahy Scale: Good     Standing balance support: During functional activity;Bilateral upper extremity supported Standing balance-Leahy Scale: Poor Standing balance comment: pt reliant on bilateral UEs on RW                           ADL either performed or assessed with clinical judgement   ADL Overall ADL's : Needs assistance/impaired Eating/Feeding: Modified independent   Grooming: Set up;Standing   Upper Body Bathing: Set up;Sitting   Lower Body Bathing: Minimal assistance;Sit to/from stand   Upper  Body Dressing : Minimal assistance;Sitting   Lower Body Dressing: Minimal assistance;Sit to/from stand   Toilet Transfer: Minimal assistance;RW;Ambulation;Comfort height toilet   Toileting- Clothing Manipulation and Hygiene: Minimal assistance;Sit to/from stand       Functional mobility during ADLs: Minimal assistance;Rolling walker;Cueing for safety General ADL Comments: States he has assistance at home with ADL tasks     Vision Baseline Vision/History: Wears glasses Wears Glasses: At all times Patient Visual Report: No change from baseline Vision Assessment?: No apparent visual deficits     Perception     Praxis      Pertinent Vitals/Pain Pain Assessment: No/denies pain     Hand Dominance Right   Extremity/Trunk Assessment Upper Extremity Assessment Upper Extremity Assessment: Generalized weakness   Lower Extremity Assessment Lower Extremity Assessment: Defer to PT evaluation   Cervical / Trunk Assessment Cervical / Trunk Assessment: Normal   Communication Communication Communication: No difficulties (mild dysarthria)   Cognition Arousal/Alertness: Awake/alert Behavior During Therapy: Impulsive (at times) Overall Cognitive Status: History of cognitive impairments - at baseline Area of Impairment: Following commands;Safety/judgement;Problem solving                       Following Commands: Follows one step commands consistently Safety/Judgement: Decreased awareness of safety;Decreased awareness of deficits   Problem Solving: Difficulty sequencing;Requires verbal cues;Slow processing     General Comments       Exercises     Shoulder Instructions      Home Living Family/patient  expects to be discharged to:: Private residence Living Arrangements: Other relatives;Other (Comment) (sister William Aguirre)) Available Help at Discharge: Family;Personal care attendant;Available 24 hours/day Type of Home: House Home Access: Stairs to enter [employer] of Steps: 3 Entrance Stairs-Rails: Right;Left;Can reach both Home Layout: One level     Bathroom Shower/Tub: Chief Strategy Officer: Standard Bathroom Accessibility: Yes How Accessible: Accessible via walker Home Equipment: Bedside commode;Walker - 2 wheels   Additional Comments: information provided by pt, no family/caregivers present to verify      Prior Functioning/Environment Level of Independence: Needs assistance  Gait / Transfers Assistance Needed: ambulates with RW ADL's / Homemaking Assistance Needed: pt reported that he has an nurse that comes Monday through Saturday from 10:00am to 5:00pm and assists him with ADLs   Comments: used cane prior to coming into hosital        OT Problem List: Decreased strength;Decreased activity tolerance;Impaired balance (sitting and/or standing);Decreased safety awareness      OT Treatment/Interventions: Self-care/ADL training;Therapeutic exercise;DME and/or AE instruction;Therapeutic activities;Cognitive remediation/compensation;Patient/family education;Balance training    OT Goals(Current goals can be found in the care plan section) Acute Rehab OT Goals Patient Stated Goal: return home OT Goal Formulation: With patient/family Time For Goal Achievement: 01/21/17 Potential to Achieve Goals: Good ADL Goals Pt Will Perform Lower Body Dressing: with supervision;sit to/from stand Pt Will Transfer to Toilet: with modified independence;ambulating;bedside commode Pt Will Perform Toileting - Clothing Manipulation and hygiene: with modified independence  OT Frequency: Min 2X/week   Barriers to D/C:            Co-evaluation              AM-PAC PT "6 Clicks" Daily Activity     Outcome Measure Help from another person eating meals?: None Help from another person taking care of personal grooming?: None Help from another person toileting, which includes using toliet, bedpan, or urinal?: A Little Help from  another person bathing (including washing, rinsing, drying)?: A Little Help from another person to put on and taking off regular upper body clothing?: A Little Help from another person to put on and taking off regular lower body clothing?: A Little 6 Click Score: 20   End of Session Equipment Utilized During Treatment: Gait belt;Rolling walker Nurse Communication: Mobility status  Activity Tolerance: Patient tolerated treatment well Patient left: in chair;with call bell/phone within reach;with chair alarm set;with family/visitor present  OT Visit Diagnosis: Unsteadiness on feet (R26.81);Muscle weakness (generalized) (M62.81);Other symptoms and signs involving cognitive function                Time: 1202-1219 OT Time Calculation (min): 17 min Charges:  OT General Charges $OT Visit: 1 Visit OT Evaluation $OT Eval Moderate Complexity: 1 Mod G-Codes: OT G-codes **NOT FOR INPATIENT CLASS** Functional Assessment Tool Used: Clinical judgement Functional Limitation: Self care Self Care Current Status (Z6109): At least 20 percent but less than 40 percent impaired, limited or restricted Self Care Goal Status (U0454): At least 1 percent but less than 20 percent impaired, limited or restricted   [facility], OT/L  [phone] 01/07/2017  Kalieb Freeland,HILLARY 01/07/2017, 2:36 PM

## 2017-01-08 ENCOUNTER — Ambulatory Visit: Payer: Medicare HMO | Admitting: Endocrinology

## 2017-01-08 DIAGNOSIS — E559 Vitamin D deficiency, unspecified: Secondary | ICD-10-CM | POA: Diagnosis not present

## 2017-01-08 DIAGNOSIS — F068 Other specified mental disorders due to known physiological condition: Secondary | ICD-10-CM | POA: Diagnosis not present

## 2017-01-08 DIAGNOSIS — E119 Type 2 diabetes mellitus without complications: Secondary | ICD-10-CM | POA: Diagnosis not present

## 2017-01-08 DIAGNOSIS — G3184 Mild cognitive impairment, so stated: Secondary | ICD-10-CM | POA: Diagnosis not present

## 2017-01-08 DIAGNOSIS — R2689 Other abnormalities of gait and mobility: Secondary | ICD-10-CM | POA: Diagnosis not present

## 2017-01-08 DIAGNOSIS — R4701 Aphasia: Secondary | ICD-10-CM | POA: Diagnosis not present

## 2017-01-08 DIAGNOSIS — R296 Repeated falls: Secondary | ICD-10-CM | POA: Diagnosis not present

## 2017-01-08 DIAGNOSIS — M6281 Muscle weakness (generalized): Secondary | ICD-10-CM | POA: Diagnosis not present

## 2017-01-08 DIAGNOSIS — G459 Transient cerebral ischemic attack, unspecified: Secondary | ICD-10-CM | POA: Diagnosis not present

## 2017-01-08 DIAGNOSIS — E785 Hyperlipidemia, unspecified: Secondary | ICD-10-CM | POA: Diagnosis not present

## 2017-01-08 DIAGNOSIS — I10 Essential (primary) hypertension: Secondary | ICD-10-CM | POA: Diagnosis not present

## 2017-01-08 DIAGNOSIS — R41841 Cognitive communication deficit: Secondary | ICD-10-CM | POA: Diagnosis not present

## 2017-01-08 DIAGNOSIS — F0391 Unspecified dementia with behavioral disturbance: Secondary | ICD-10-CM | POA: Diagnosis not present

## 2017-01-08 DIAGNOSIS — E7800 Pure hypercholesterolemia, unspecified: Secondary | ICD-10-CM | POA: Diagnosis not present

## 2017-01-08 DIAGNOSIS — G9349 Other encephalopathy: Secondary | ICD-10-CM | POA: Diagnosis not present

## 2017-01-08 DIAGNOSIS — E039 Hypothyroidism, unspecified: Secondary | ICD-10-CM | POA: Diagnosis not present

## 2017-01-08 DIAGNOSIS — N390 Urinary tract infection, site not specified: Secondary | ICD-10-CM | POA: Diagnosis not present

## 2017-01-08 DIAGNOSIS — G9520 Unspecified cord compression: Secondary | ICD-10-CM | POA: Diagnosis not present

## 2017-01-08 DIAGNOSIS — R278 Other lack of coordination: Secondary | ICD-10-CM | POA: Diagnosis not present

## 2017-01-08 DIAGNOSIS — B999 Unspecified infectious disease: Secondary | ICD-10-CM | POA: Diagnosis not present

## 2017-01-08 DIAGNOSIS — D649 Anemia, unspecified: Secondary | ICD-10-CM | POA: Diagnosis not present

## 2017-01-08 DIAGNOSIS — K219 Gastro-esophageal reflux disease without esophagitis: Secondary | ICD-10-CM | POA: Diagnosis not present

## 2017-01-08 DIAGNOSIS — I2510 Atherosclerotic heart disease of native coronary artery without angina pectoris: Secondary | ICD-10-CM | POA: Diagnosis not present

## 2017-01-08 DIAGNOSIS — E1142 Type 2 diabetes mellitus with diabetic polyneuropathy: Secondary | ICD-10-CM | POA: Diagnosis not present

## 2017-01-08 LAB — GLUCOSE, CAPILLARY
GLUCOSE-CAPILLARY: 115 mg/dL — AB (ref 65–99)
GLUCOSE-CAPILLARY: 103 mg/dL — AB (ref 65–99)
GLUCOSE-CAPILLARY: 177 mg/dL — AB (ref 65–99)

## 2017-01-08 MED ORDER — INSULIN GLARGINE 100 UNIT/ML SOLOSTAR PEN: 15.0000 [IU] | PEN_INJECTOR | SUBCUTANEOUS | 11 refills | Status: DC

## 2017-01-08 MED ORDER — INSULIN ASPART 100 UNIT/ML ~~LOC~~ SOLN: 0.0000 [IU] | Freq: Three times a day (TID) | SUBCUTANEOUS | 11 refills | Status: DC

## 2017-01-08 MED ORDER — TRAMADOL HCL 50 MG PO TABS: 50.0000 mg | ORAL_TABLET | Freq: Two times a day (BID) | ORAL | 0 refills | Status: DC | PRN

## 2017-01-08 MED ORDER — LOSARTAN POTASSIUM 50 MG PO TABS
50.0000 mg | ORAL_TABLET | Freq: Every day | ORAL | Status: DC
  Administered 2017-01-08: 50 mg via ORAL

## 2017-01-08 MED ORDER — AMOXICILLIN 500 MG PO CAPS: 500.0000 mg | ORAL_CAPSULE | Freq: Three times a day (TID) | ORAL | Status: DC

## 2017-01-08 MED ORDER — CEFUROXIME AXETIL 500 MG PO TABS: 500.0000 mg | ORAL_TABLET | Freq: Two times a day (BID) | ORAL | Status: DC

## 2017-01-08 MED ORDER — ASPIRIN 81 MG PO TBEC: 81.0000 mg | DELAYED_RELEASE_TABLET | Freq: Every day | ORAL | 2 refills | Status: AC

## 2017-01-08 MED ORDER — AMOXICILLIN 500 MG PO CAPS
500.0000 mg | ORAL_CAPSULE | Freq: Three times a day (TID) | ORAL | Status: DC
  Administered 2017-01-08: 500 mg via ORAL

## 2017-01-08 NOTE — Clinical Social Work Placement (Signed)
   CLINICAL SOCIAL WORK PLACEMENT  NOTE  Date:  01/08/2017  Patient Details  Name: William Aguirre MRN: [id number] Date of Birth: Feb 05, 1935  Clinical Social Work is seeking post-discharge placement for this patient at the Skilled  Nursing Facility level of care (*CSW will initial, date and re-position this form in  chart as items are completed):  Yes   Patient/family provided with [facility] Clinical Social Work Department's list of facilities offering this level of care within the geographic area requested by the patient (or if unable, by the patient's family).  Yes   Patient/family informed of their freedom to choose among providers that offer the needed level of care, that participate in Medicare, Medicaid or managed care program needed by the patient, have an available bed and are willing to accept the patient.  Yes   Patient/family informed of 's ownership interest in [facility] and [facility], as well as of the fact that they are under no obligation to receive care at these facilities.  PASRR submitted to EDS on 01/07/17     PASRR number received on 01/07/17     Existing PASRR number confirmed on       FL2 transmitted to all facilities in geographic area requested by pt/family on 01/07/17     FL2 transmitted to all facilities within larger geographic area on       Patient informed that his/her managed care company has contracts with or will negotiate with certain facilities, including the following:        Yes   Patient/family informed of bed offers received.  Patient chooses bed at [facility]     Physician recommends and patient chooses bed at      Patient to be transferred to [facility] on 01/08/17.  Patient to be transferred to facility by PTAR     Patient family notified on 01/08/17 of transfer.  Name of family member notified:  Marni Griffon     PHYSICIAN       Additional Comment:     _______________________________________________ Margarito Liner, LCSW 01/08/2017, 4:58 PM

## 2017-01-08 NOTE — Clinical Social Work Note (Addendum)
[facility] is full. Patient is agreeable to [facility]. They have extended a bed offer but CSW waiting on confirmation that they can take him today. CSW faxed clinicals to [facility] for review.  Charlynn Court, CSW [phone]  12:30 pm [facility] is full. Patient's bed offers are [facility], [facility], and [facility]. The only two pending referrals that have not told CSW they are full are [place]. Patient's daughter is going to call a friend and get some input on which facility she should choose. Received a call from [facility] that they did receive the clinicals but need a facility to assign to it before they can build the case.  Charlynn Court, CSW [phone]  1:10 pm Patient's daughter has chosen [facility]. Hospital liaison for the facility is waiting on confirmation that they have a bed available today.  Charlynn Court, CSW [phone]  2:15 pm [facility] can take patient today. CSW notified Gunnar Fusi with [address]   Charlynn Court, CSW [phone]  3:43 pm Authorization still pending.  Charlynn Court, CSW [phone]  4:38 pm Authorization approved: [id number]. SNF admissions coordinator notified.  Charlynn Court, CSW [phone]

## 2017-01-08 NOTE — Care Management Note (Signed)
Case Management Note  Patient Details  Name: OMEGA SLAGER MRN: [id number] Date of Birth: July 23, 1934  Subjective/Objective:                    Action/Plan: Patient discharging to [facility] today. No further needs per CM.   Expected Discharge Date:  01/08/17               Expected Discharge Plan:  Skilled Nursing Facility  In-House Referral:  Clinical Social Work  Discharge planning Services     Post Acute Care Choice:    Choice offered to:     DME Arranged:    DME Agency:     HH Arranged:    HH Agency:     Status of Service:  Completed, signed off  If discussed at [employer], dates discussed:    Additional Comments:  Kermit Balo, RN 01/08/2017, 1:57 PM

## 2017-01-08 NOTE — Discharge Summary (Signed)
Physician Discharge Summary   Patient ID: William Aguirre MRN: [id number] DOB/AGE: 11/07/1934 82 y.o.  Admit date: 01/06/2017 Discharge date: 01/08/2017  Primary Care Physician:  Shirline Frees, NP  Discharge Diagnoses:    . Hyperlipidemia . Essential hypertension . CORONARY ATHEROSCLEROSIS NATIVE CORONARY ARTERY . Mild cognitive impairment with memory loss . TIA (transient ischemic attack) . UTI (urinary tract infection)   Diabetes mellitus   Consults:  neurology  Recommendations for Outpatient Follow-up:  1. Please repeat CBC/BMET at next visit 2. PT recommended supervision/assistance 24 hours   DIET:carb modified diet    Allergies:   Allergies  Allergen Reactions  . Hydrocodone-Acetaminophen Other (See Comments)     Delusions     DISCHARGE MEDICATIONS: Current Discharge Medication List    START taking these medications   Details  amoxicillin (AMOXIL) 500 MG capsule Take 1 capsule (500 mg total) by mouth every 8 (eight) hours. X 7 days    insulin aspart (NOVOLOG) 100 UNIT/ML injection Inject 0-9 Units into the skin 3 (three) times daily with meals. Sliding scale CBG 70 - 120: 0 units CBG 121 - 150: 1 unit,  CBG 151 - 200: 2 units,  CBG 201 - 250: 3 units,  CBG 251 - 300: 5 units,  CBG 301 - 350: 7 units,  CBG 351 - 400: 9 units   CBG > 400: 9 units and notify your MD Qty: 10 mL, Refills: 11      CONTINUE these medications which have CHANGED   Details  aspirin (SM ASPIRIN ADULT LOW STRENGTH) 81 MG EC tablet Take 1 tablet (81 mg total) by mouth daily. Swallow whole. Qty: 90 tablet, Refills: 2    Insulin Glargine (LANTUS SOLOSTAR) 100 UNIT/ML Solostar Pen Inject 15 Units into the skin every morning. Qty: 30 mL, Refills: 11    traMADol (ULTRAM) 50 MG tablet Take 1 tablet (50 mg total) by mouth 2 (two) times daily as needed for severe pain. Qty: 20 tablet, Refills: 0      CONTINUE these medications which have NOT CHANGED   Details  acetaminophen  (TYLENOL) 325 MG tablet Take 2 tablets (650 mg total) by mouth every 6 (six) hours as needed for mild pain (or Fever >/= 101).    atorvastatin (LIPITOR) 80 MG tablet TAKE 1 TABLET BY MOUTH ONCE DAILY Qty: 30 tablet, Refills: 8    bisoprolol (ZEBETA) 5 MG tablet Take 1 tablet (5 mg total) by mouth 2 (two) times daily. Qty: 60 tablet, Refills: 3   Associated Diagnoses: Essential hypertension    clopidogrel (PLAVIX) 75 MG tablet TAKE 1 TABLET BY MOUTH ONCE DAILY WITH BREAKFAST Qty: 90 tablet, Refills: 3    donepezil (ARICEPT) 5 MG tablet TAKE 1 TABLET BY MOUTH EVERY NIGHT AT BEDTIME Qty: 30 tablet, Refills: 3    losartan (COZAAR) 50 MG tablet Take 1 tablet (50 mg total) by mouth daily. Qty: 90 tablet, Refills: 0    pantoprazole (PROTONIX) 40 MG tablet TAKE 1 TABLET BY MOUTH EVERY DAY *REPLACES OMEPRAZOLE* Qty: 90 tablet, Refills: 0    ondansetron (ZOFRAN) 4 MG tablet Take 1 tablet (4 mg total) by mouth every 8 (eight) hours as needed for nausea or vomiting. Qty: 20 tablet, Refills: 0      STOP taking these medications     Melatonin 5 MG TABS          Brief H and P: For complete details please refer to admission H and P, but in brief Per admit note  by Dr. Margot Ables on 9/26 William Apley Gladdenis a 82 y.o.malewith medical history significant for HTN, HLD,CAD, diabetes, mild dementia, brought today ED for evaluation of strokelike symptoms. In review, the patient was noted yesterday evening to have expressive face E by his daughter. He was initially taken by his daughter to [facility], but he refuses to go there, wanting to go to [facility]. Instead, he went home. According to his daughter, he was having "wobbly "speech. No apparent unilateral weakness or decreased sensation.   Hospital Course:  TIA (transient ischemic attack) - Presented with slurred speech. Outside of the TPA window due to delayed presentation -MRI of the brain showed no acute intracranial  abnormality. 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 MRA neck showed patent carotid arteries systems bilaterally without high-grade or critical flow-limiting stenosis. - Lipid panel showed LDL 97, placed on statin, hemoglobin A1c 7.7. UDS negative - 2-D echo showed EF of 55-60%, normal wall motion, grade 1 diastolic dysfunction - PTOT evaluation recommended 24 7 supervision,SNF. However per daughter it has been difficult to take care of him at home, recently was kicked out by his wife    Diabetes mellitus (HCC) - continue sliding scale insulin, Lantus 15 units daily, adjust insulin regimen with CBG readings - hemoglobin A1c 7.7    Hyperlipidemia - lipid panel showed an LDL of 97, placed on statin    Essential hypertension - currently stable, continue be bisoprolol    CORONARY ATHEROSCLEROSIS NATIVE CORONARY ARTERY - stable, no chest pain or shortness of breath, continue aspirin, statin, beta blocker    Mild cognitive impairment with memory loss - continue Aricept    UTI (urinary tract infection) -urine culture showed enterococcus, placed on amoxicillin for 7 days per pharmacy recommendations  Day of Discharge BP (!) 142/85 (BP Location: Left Arm)   Pulse 74   Temp 99 F (37.2 C) (Oral)   Resp 18   Ht  (1.727 m)   Wt 90.7 kg (199 lb 15.3 oz)   SpO2 97%   BMI 30.40 kg/m   Physical Exam: General: Alert and awake oriented x3 not in any acute distress. HEENT: anicteric sclera, pupils reactive to light and accommodation CVS: S1-S2 clear no murmur rubs or gallops Chest: clear to auscultation bilaterally, no wheezing rales or rhonchi Abdomen: soft nontender, nondistended, normal bowel sounds Extremities: no cyanosis, clubbing or edema noted bilaterally Neuro: no new deficits   The results of significant diagnostics from this hospitalization (including imaging, microbiology, ancillary and laboratory) are listed below for  reference.    LAB RESULTS: Basic Metabolic Panel:  Recent Labs Lab 01/06/17 1238 01/06/17 1249 01/07/17 0946  NA 138 142 138  K 3.6 3.7 3.3*  CL 108 107 108  CO2 26  --  22  GLUCOSE 108* 110* 164*  BUN CREATININE 1.14 1.10 1.01  CALCIUM 9.4  --  8.7*   Liver Function Tests:  Recent Labs Lab 01/06/17 1238  AST 19  ALT 15*  ALKPHOS 96  BILITOT 0.8  PROT 7.2  ALBUMIN 3.6   No results for input(s): LIPASE, AMYLASE in the last 168 hours. No results for input(s): AMMONIA in the last 168 hours. CBC:  Recent Labs Lab 01/06/17 1238 01/06/17 1249 01/07/17 0946  WBC 8.2  --  8.0  NEUTROABS 4.1  --   --   HGB 13.8 14.6 12.4*  HCT 41.8 43.0 37.3*  MCV 80.9  --  80.6  PLT 151  --  139*   Cardiac Enzymes:  Recent Labs Lab 01/06/17 1614  TROPONINI <0.03   BNP: Invalid input(s): POCBNP CBG:  Recent Labs Lab 01/08/17 1100 01/08/17 1607  GLUCAP 103* 177*    Significant Diagnostic Studies:  Ct Head Wo Contrast  Result Date: 01/06/2017 CLINICAL DATA:  82 year old male with focal neurologic deficit for greater than 6 hours. Possible cerebrovascular accident. EXAM: CT HEAD WITHOUT CONTRAST TECHNIQUE: Contiguous axial images were obtained from the base of the skull through the vertex without intravenous contrast. COMPARISON:  PET-CT 07/08/2016. FINDINGS: Brain: Mild to moderate cerebral atrophy. Patchy and confluent areas of decreased attenuation are noted throughout the deep and periventricular white matter of the cerebral hemispheres bilaterally, compatible with chronic microvascular ischemic disease. No evidence of acute infarction, hemorrhage, hydrocephalus, extra-axial collection or mass lesion/mass effect. Vascular: No hyperdense vessel or unexpected calcification. Skull: Normal. Negative for fracture or focal lesion. Sinuses/Orbits: No acute finding. Other: None. IMPRESSION: 1. No acute intracranial abnormalities. 2. Mild to moderate cerebral atrophy with  extensive chronic microvascular ischemic changes in the cerebral white matter, similar to the prior examination. Electronically Signed   By: Trudie Reed M.D.   On: 01/06/2017 13:13   Mr Maxine Glenn Neck W Wo Contrast  Result Date: 01/06/2017 CLINICAL DATA:  Initial evaluation for acute altered mental status. EXAM: MRI HEAD WITHOUT CONTRAST MRA HEAD WITHOUT CONTRAST MRA NECK WITHOUT AND WITH CONTRAST TECHNIQUE: Multiplanar, multiecho pulse sequences of the brain and surrounding structures were obtained without intravenous contrast. Angiographic images of the Circle of Willis were obtained using MRA technique without intravenous contrast. Angiographic images of the neck were obtained using MRA technique without and with intravenous contrast. Carotid stenosis measurements (when applicable) are obtained utilizing NASCET criteria, using the distal internal carotid diameter as the denominator. CONTRAST:  20 cc of MultiHance. COMPARISON:  Prior CT from earlier same day. FINDINGS: MRI HEAD FINDINGS Diffuse prominence of the CSF containing spaces compatible with generalized cerebral atrophy. Patchy and confluent T2/FLAIR hyperintensity within the periventricular and deep white matter both cerebral hemispheres most consistent with chronic micro vessel ischemic changes, mild to moderate nature. No abnormal foci of restricted diffusion to suggest acute or subacute ischemia. Gray-white matter differentiation maintained. No encephalomalacia to suggest remote cortical infarction. No acute or chronic intracranial hemorrhage. No mass lesion, midline shift or mass effect. Diffuse ventricular prominence related to global parenchymal volume loss without hydrocephalus. No extra-axial fluid collection. Major dural sinuses grossly patent. Pituitary gland suprasellar region within normal limits. Midline structures intact and normal. Heterogeneous flow void within the left V4 segment, which may be related to slow flow and/ or occlusion.  Similarly, poor flow void present within the basilar artery. Right vertebral artery appears patent. Remainder the intracranial vascular flow voids maintained. Craniocervical junction normal. Advanced degenerative spondylolysis noted within the upper cervical spine with associated severe stenosis at C3-4, grossly stable relative to recent cervical spine MRI from 10/25/2016. Bone marrow signal intensity within normal limits. No scalp soft tissue abnormality. Globes and orbital soft tissues within normal limits. Patient status post lens extraction bilaterally. Paranasal sinuses largely clear. Right mastoid effusion noted. Inner ear structures normal. MRA HEAD FINDINGS ANTERIOR CIRCULATION: Study degraded by motion artifact. Distal cervical segments of the internal carotid arteries are patent with antegrade flow. Petrous segments patent bilaterally. Scattered multifocal atheromatous irregularity within the cavernous/ supraclinoid ICAs with mild to moderate multifocal narrowing, right worse than left. No high-grade flow-limiting stenosis. ICA termini patent. Dominant left A1  segment widely patent. Right A1 segment hypoplastic and attenuated as compared to the left, which likely accounts for the diminutive right ICA as compared to the left. Patent anterior communicating artery. Anterior cerebral arteries patent to their distal aspects. M1 segments patent without stenosis or occlusion. No proximal M2 occlusion. Distal MCA branches demonstrate multifocal atheromatous irregularity, slightly worse on the right. POSTERIOR CIRCULATION: Atheromatous irregularity throughout the right V4 segment which is patent to the vertebrobasilar junction without high-grade stenosis. Right PICA patent proximally. The left vertebral artery not visualize, likely occluded. There is filling of the left PICA and/or dominant left AICA, likely via retrograde flow. 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 Distal reconstitution at the basilar  tip via flow from prominent bilateral posterior communicating arteries. Superior cerebral arteries are perfused and patent bilaterally. PCAs supplied via the posterior communicating arteries and are patent to their distal aspects. Short-segment severe distal right P3 stenosis noted (series 406, image 16). Additional small vessel atheromatous irregularity noted within the distal PCA branches bilaterally. No aneurysm. MRA NECK FINDINGS Source images reviewed. Visualized aortic arch of normal caliber with normal 3 vessel morphology. No flow-limiting stenosis about the origin of the great vessels. Visualized subclavian artery is patent without stenosis. Right common carotid artery patent from its origin to the bifurcation without stenosis. Atheromatous irregularity about the right bifurcation without flow-limiting stenosis. Right ICA mildly tortuous but patent to the skullbase without stenosis or occlusion. Left common carotid artery tortuous proximally but widely patent to the carotid bifurcation. Atheromatous regularity about the left carotid bifurcation without stenosis. Left ICA tortuous but patent distally to the skullbase without stenosis or occlusion. Both of the vertebral arteries arise from the subclavian arteries. Right vertebral artery dominant. Tandem moderate to severe proximal/ right V1 stenoses (series 16109, image 14). Right vertebral artery otherwise patent to the skullbase without stenosis. Left vertebral artery diffusely hypoplastic with multifocal atheromatous irregularity throughout the V1 and V2 segments. Left vertebral artery occludes as it courses into the cranial vault. IMPRESSION: MRI HEAD IMPRESSION: 1. No acute intracranial infarct or other abnormality. 2. Moderate cerebral atrophy with chronic microvascular ischemic disease. 3. 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 MRA HEAD  IMPRESSION: 1. Occlusion of the proximal basilar artery. Distal reconstitution via flow from the anterior circulation via the posterior communicating arteries. PCAs are well perfused bilaterally. 2. Occluded left vertebral artery. 3. Additional moderate atheromatous irregularity throughout the intracranial circulation, primarily involving the carotid siphons and distal small vessels. MRA NECK IMPRESSION: 1. Patent carotid artery systems bilaterally without high-grade or critical flow limiting stenosis. 2. Tandem moderate to severe proximal right V1 stenoses. Dominant right vertebral artery otherwise patent within the neck. 3. Diffusely irregular hypoplastic left vertebral artery, which occludes at the skullbase. Electronically Signed   By: Rise Mu M.D.   On: 01/06/2017 22:56   Mr Brain Wo Contrast  Result Date: 01/06/2017 CLINICAL DATA:  Initial evaluation for acute altered mental status. EXAM: MRI HEAD WITHOUT CONTRAST MRA HEAD WITHOUT CONTRAST MRA NECK WITHOUT AND WITH CONTRAST TECHNIQUE: Multiplanar, multiecho pulse sequences of the brain and surrounding structures were obtained without intravenous contrast. Angiographic images of the Circle of Willis were obtained using MRA technique without intravenous contrast. Angiographic images of the neck were obtained using MRA technique without and with intravenous contrast. Carotid stenosis measurements (when applicable) are obtained utilizing NASCET criteria, using the distal internal carotid diameter as the denominator. CONTRAST:  20 cc of MultiHance. COMPARISON:  Prior CT from earlier same day. FINDINGS: MRI HEAD FINDINGS Diffuse prominence of the CSF containing spaces compatible with generalized cerebral atrophy. Patchy and confluent T2/FLAIR hyperintensity within the periventricular and deep white matter both cerebral hemispheres most consistent with chronic micro vessel ischemic changes, mild to moderate nature. No abnormal foci of restricted  diffusion to suggest acute or subacute ischemia. Gray-white matter differentiation maintained. No encephalomalacia to suggest remote cortical infarction. No acute or chronic intracranial hemorrhage. No mass lesion, midline shift or mass effect. Diffuse ventricular prominence related to global parenchymal volume loss without hydrocephalus. No extra-axial fluid collection. Major dural sinuses grossly patent. Pituitary gland suprasellar region within normal limits. Midline structures intact and normal. Heterogeneous flow void within the left V4 segment, which may be related to slow flow and/ or occlusion. Similarly, poor flow void present within the basilar artery. Right vertebral artery appears patent. Remainder the intracranial vascular flow voids maintained. Craniocervical junction normal. Advanced degenerative spondylolysis noted within the upper cervical spine with associated severe stenosis at C3-4, grossly stable relative to recent cervical spine MRI from 10/25/2016. Bone marrow signal intensity within normal limits. No scalp soft tissue abnormality. Globes and orbital soft tissues within normal limits. Patient status post lens extraction bilaterally. Paranasal sinuses largely clear. Right mastoid effusion noted. Inner ear structures normal. MRA HEAD FINDINGS ANTERIOR CIRCULATION: Study degraded by motion artifact. Distal cervical segments of the internal carotid arteries are patent with antegrade flow. Petrous segments patent bilaterally. Scattered multifocal atheromatous irregularity within the cavernous/ supraclinoid ICAs with mild to moderate multifocal narrowing, right worse than left. No high-grade flow-limiting stenosis. ICA termini patent. Dominant left A1 segment widely patent. Right A1 segment hypoplastic and attenuated as compared to the left, which likely accounts for the diminutive right ICA as compared to the left. Patent anterior communicating artery. Anterior cerebral arteries patent to their  distal aspects. M1 segments patent without stenosis or occlusion. No proximal M2 occlusion. Distal MCA branches demonstrate multifocal atheromatous irregularity, slightly worse on the right. POSTERIOR CIRCULATION: Atheromatous irregularity throughout the right V4 segment which is patent to the vertebrobasilar junction without high-grade stenosis. Right PICA patent proximally. The left vertebral artery not visualize, likely occluded. There is filling of the left PICA and/or dominant left AICA, likely via retrograde flow. 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 Distal reconstitution at the basilar tip via flow from prominent bilateral posterior communicating arteries. Superior cerebral arteries are perfused and patent bilaterally. PCAs supplied via the posterior communicating arteries and are patent to their distal aspects. Short-segment severe distal right P3 stenosis noted (series 406, image 16). Additional small vessel atheromatous irregularity noted within the distal PCA branches bilaterally. No aneurysm. MRA NECK FINDINGS Source images reviewed. Visualized aortic arch of normal caliber with normal 3 vessel morphology. No flow-limiting stenosis about the origin of the great vessels. Visualized subclavian artery is patent without stenosis. Right common carotid artery patent from its origin to the bifurcation without stenosis. Atheromatous irregularity about the right bifurcation without flow-limiting stenosis. Right ICA mildly tortuous but patent to the skullbase without stenosis or occlusion. Left common carotid artery tortuous proximally but widely patent to the carotid bifurcation. Atheromatous regularity about the left carotid bifurcation without stenosis. Left ICA tortuous but patent distally to the skullbase without stenosis or occlusion. Both of the vertebral arteries arise from the subclavian arteries. Right vertebral artery dominant. Tandem moderate to severe proximal/ right V1 stenoses (series  [id number], image 14). Right vertebral artery otherwise patent to  the skullbase without stenosis. Left vertebral artery diffusely hypoplastic with multifocal atheromatous irregularity throughout the V1 and V2 segments. Left vertebral artery occludes as it courses into the cranial vault. IMPRESSION: MRI HEAD IMPRESSION: 1. No acute intracranial infarct or other abnormality. 2. Moderate cerebral atrophy with chronic microvascular ischemic disease. 3. 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 MRA HEAD IMPRESSION: 1. Occlusion of the proximal basilar artery. Distal reconstitution via flow from the anterior circulation via the posterior communicating arteries. PCAs are well perfused bilaterally. 2. Occluded left vertebral artery. 3. Additional moderate atheromatous irregularity throughout the intracranial circulation, primarily involving the carotid siphons and distal small vessels. MRA NECK IMPRESSION: 1. Patent carotid artery systems bilaterally without high-grade or critical flow limiting stenosis. 2. Tandem moderate to severe proximal right V1 stenoses. Dominant right vertebral artery otherwise patent within the neck. 3. Diffusely irregular hypoplastic left vertebral artery, which occludes at the skullbase. Electronically Signed   By: Rise Mu M.D.   On: 01/06/2017 22:56   Dg Chest Portable 1 View  Result Date: 01/06/2017 CLINICAL DATA:  AMS,HX HTN,DM EXAM: PORTABLE CHEST 1 VIEW COMPARISON:  07/08/2016 FINDINGS: Normal mediastinum and cardiac silhouette. Normal pulmonary vasculature. No evidence of effusion, infiltrate, or pneumothorax. No acute bony abnormality. IMPRESSION: No acute cardiopulmonary process. Electronically Signed   By: Genevive Bi M.D.   On: 01/06/2017 13:28   Mr Maxine Glenn Head Wo Contrast  Result Date: 01/06/2017 CLINICAL DATA:  Initial evaluation for acute altered mental  status. EXAM: MRI HEAD WITHOUT CONTRAST MRA HEAD WITHOUT CONTRAST MRA NECK WITHOUT AND WITH CONTRAST TECHNIQUE: Multiplanar, multiecho pulse sequences of the brain and surrounding structures were obtained without intravenous contrast. Angiographic images of the Circle of Willis were obtained using MRA technique without intravenous contrast. Angiographic images of the neck were obtained using MRA technique without and with intravenous contrast. Carotid stenosis measurements (when applicable) are obtained utilizing NASCET criteria, using the distal internal carotid diameter as the denominator. CONTRAST:  20 cc of MultiHance. COMPARISON:  Prior CT from earlier same day. FINDINGS: MRI HEAD FINDINGS Diffuse prominence of the CSF containing spaces compatible with generalized cerebral atrophy. Patchy and confluent T2/FLAIR hyperintensity within the periventricular and deep white matter both cerebral hemispheres most consistent with chronic micro vessel ischemic changes, mild to moderate nature. No abnormal foci of restricted diffusion to suggest acute or subacute ischemia. Gray-white matter differentiation maintained. No encephalomalacia to suggest remote cortical infarction. No acute or chronic intracranial hemorrhage. No mass lesion, midline shift or mass effect. Diffuse ventricular prominence related to global parenchymal volume loss without hydrocephalus. No extra-axial fluid collection. Major dural sinuses grossly patent. Pituitary gland suprasellar region within normal limits. Midline structures intact and normal. Heterogeneous flow void within the left V4 segment, which may be related to slow flow and/ or occlusion. Similarly, poor flow void present within the basilar artery. Right vertebral artery appears patent. Remainder the intracranial vascular flow voids maintained. Craniocervical junction normal. Advanced degenerative spondylolysis noted within the upper cervical spine with associated severe stenosis at C3-4,  grossly stable relative to recent cervical spine MRI from 10/25/2016. Bone marrow signal intensity within normal limits. No scalp soft tissue abnormality. Globes and orbital soft tissues within normal limits. Patient status post lens extraction bilaterally. Paranasal sinuses largely clear. Right mastoid effusion noted. Inner ear structures normal. MRA HEAD FINDINGS ANTERIOR CIRCULATION: Study degraded by motion artifact. Distal cervical segments of the internal carotid arteries are  patent with antegrade flow. Petrous segments patent bilaterally. Scattered multifocal atheromatous irregularity within the cavernous/ supraclinoid ICAs with mild to moderate multifocal narrowing, right worse than left. No high-grade flow-limiting stenosis. ICA termini patent. Dominant left A1 segment widely patent. Right A1 segment hypoplastic and attenuated as compared to the left, which likely accounts for the diminutive right ICA as compared to the left. Patent anterior communicating artery. Anterior cerebral arteries patent to their distal aspects. M1 segments patent without stenosis or occlusion. No proximal M2 occlusion. Distal MCA branches demonstrate multifocal atheromatous irregularity, slightly worse on the right. POSTERIOR CIRCULATION: Atheromatous irregularity throughout the right V4 segment which is patent to the vertebrobasilar junction without high-grade stenosis. Right PICA patent proximally. The left vertebral artery not visualize, likely occluded. There is filling of the left PICA and/or dominant left AICA, likely via retrograde flow. 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 Distal reconstitution at the basilar tip via flow from prominent bilateral posterior communicating arteries. Superior cerebral arteries are perfused and patent bilaterally. PCAs supplied via the posterior communicating arteries and are patent to their distal aspects. Short-segment severe distal right P3 stenosis noted (series 406, image  16). Additional small vessel atheromatous irregularity noted within the distal PCA branches bilaterally. No aneurysm. MRA NECK FINDINGS Source images reviewed. Visualized aortic arch of normal caliber with normal 3 vessel morphology. No flow-limiting stenosis about the origin of the great vessels. Visualized subclavian artery is patent without stenosis. Right common carotid artery patent from its origin to the bifurcation without stenosis. Atheromatous irregularity about the right bifurcation without flow-limiting stenosis. Right ICA mildly tortuous but patent to the skullbase without stenosis or occlusion. Left common carotid artery tortuous proximally but widely patent to the carotid bifurcation. Atheromatous regularity about the left carotid bifurcation without stenosis. Left ICA tortuous but patent distally to the skullbase without stenosis or occlusion. Both of the vertebral arteries arise from the subclavian arteries. Right vertebral artery dominant. Tandem moderate to severe proximal/ right V1 stenoses (series 16109, image 14). Right vertebral artery otherwise patent to the skullbase without stenosis. Left vertebral artery diffusely hypoplastic with multifocal atheromatous irregularity throughout the V1 and V2 segments. Left vertebral artery occludes as it courses into the cranial vault. IMPRESSION: MRI HEAD IMPRESSION: 1. No acute intracranial infarct or other abnormality. 2. Moderate cerebral atrophy with chronic microvascular ischemic disease. 3. 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 MRA HEAD IMPRESSION: 1. Occlusion of the proximal basilar artery. Distal reconstitution via flow from the anterior circulation via the posterior communicating arteries. PCAs are well perfused bilaterally. 2. Occluded left vertebral artery. 3. Additional moderate atheromatous irregularity throughout the  intracranial circulation, primarily involving the carotid siphons and distal small vessels. MRA NECK IMPRESSION: 1. Patent carotid artery systems bilaterally without high-grade or critical flow limiting stenosis. 2. Tandem moderate to severe proximal right V1 stenoses. Dominant right vertebral artery otherwise patent within the neck. 3. Diffusely irregular hypoplastic left vertebral artery, which occludes at the skullbase. Electronically Signed   By: Rise Mu M.D.   On: 01/06/2017 22:56    2D ECHO: Study Conclusions  - Left ventricle: The cavity size was normal. Wall thickness was   normal. Systolic function was normal. The estimated ejection   fraction was in the range of 55% to 60%. Wall motion was normal;   there were no regional wall motion abnormalities. Doppler   parameters are consistent with abnormal left ventricular  relaxation (grade 1 diastolic dysfunction).  Disposition and Follow-up: Discharge Instructions    Diet - low sodium heart healthy    Complete by:  As directed    Diet Carb Modified    Complete by:  As directed    Increase activity slowly    Complete by:  As directed        DISPOSITION:SNF   DISCHARGE FOLLOW-UP  Contact information for follow-up providers    Butch Penny, NP. Go on 02/23/2017.   Specialty:  Gerontology Contact information: [address] [place] [address] [phone]        Marvel Plan, MD. Schedule an appointment as soon as possible for a visit in 6 week(s).   Specialty:  Neurology Contact information: [address] [place] [address] [phone]            Contact information for after-discharge care    Destination    [facility] .   Specialty:  Skilled Nursing Facility Contact information: [address] [place] [address] [phone]                   Time spent on Discharge:   Signed:   Thad Ranger  M.D. [facility] 01/08/2017, 4:34 PM Pager: [phone]

## 2017-01-08 NOTE — Progress Notes (Signed)
[facility] arrived to take patient to [facility]. IV removed by previous RN. Telemetry box removed.

## 2017-01-08 NOTE — Clinical Social Work Note (Signed)
CSW facilitated patient discharge including contacting patient family and facility to confirm patient discharge plans. Clinical information faxed to facility and family agreeable with plan. CSW arranged ambulance transport via [facility] to [facility]. RN to call report prior to discharge ([phone]).  CSW will sign off for now as social work intervention is no longer needed. Please consult us again if new needs arise.  Favian Kittleson, CSW [phone]   

## 2017-01-08 NOTE — Progress Notes (Signed)
Report called to [facility]. Awaiting [facility] for transport. Madeline Bebout M Chanta Bauers  

## 2017-01-09 LAB — URINE CULTURE

## 2017-01-11 ENCOUNTER — Other Ambulatory Visit: Payer: Self-pay | Admitting: Cardiovascular Disease

## 2017-01-11 DIAGNOSIS — R296 Repeated falls: Secondary | ICD-10-CM | POA: Diagnosis not present

## 2017-01-11 DIAGNOSIS — N390 Urinary tract infection, site not specified: Secondary | ICD-10-CM | POA: Diagnosis not present

## 2017-01-11 DIAGNOSIS — G459 Transient cerebral ischemic attack, unspecified: Secondary | ICD-10-CM | POA: Diagnosis not present

## 2017-01-11 DIAGNOSIS — I10 Essential (primary) hypertension: Secondary | ICD-10-CM | POA: Diagnosis not present

## 2017-01-15 DIAGNOSIS — N390 Urinary tract infection, site not specified: Secondary | ICD-10-CM | POA: Diagnosis not present

## 2017-01-15 DIAGNOSIS — I2510 Atherosclerotic heart disease of native coronary artery without angina pectoris: Secondary | ICD-10-CM | POA: Diagnosis not present

## 2017-01-15 DIAGNOSIS — F0391 Unspecified dementia with behavioral disturbance: Secondary | ICD-10-CM | POA: Diagnosis not present

## 2017-01-15 DIAGNOSIS — I10 Essential (primary) hypertension: Secondary | ICD-10-CM | POA: Diagnosis not present

## 2017-01-15 DIAGNOSIS — G459 Transient cerebral ischemic attack, unspecified: Secondary | ICD-10-CM | POA: Diagnosis not present

## 2017-01-15 DIAGNOSIS — E119 Type 2 diabetes mellitus without complications: Secondary | ICD-10-CM | POA: Diagnosis not present

## 2017-02-02 DIAGNOSIS — I2510 Atherosclerotic heart disease of native coronary artery without angina pectoris: Secondary | ICD-10-CM | POA: Diagnosis not present

## 2017-02-02 DIAGNOSIS — R296 Repeated falls: Secondary | ICD-10-CM | POA: Diagnosis not present

## 2017-02-02 DIAGNOSIS — G459 Transient cerebral ischemic attack, unspecified: Secondary | ICD-10-CM | POA: Diagnosis not present

## 2017-02-02 DIAGNOSIS — N390 Urinary tract infection, site not specified: Secondary | ICD-10-CM | POA: Diagnosis not present

## 2017-02-09 ENCOUNTER — Telehealth: Payer: Self-pay | Admitting: Neurology

## 2017-02-09 NOTE — Telephone Encounter (Signed)
Patient's daughter is calling to discuss if the patient should be in an assisted living facility or independent living.

## 2017-02-09 NOTE — Telephone Encounter (Signed)
I tried to call William Aguirre, unable to leave a message and she did not pick up.  I called the patient's home, they are trying to decide whether he should be in independent living or assisted living, given his memory problems and physical deficits, assisted living is more appropriate.  I relayed this message.

## 2017-02-12 DIAGNOSIS — G8929 Other chronic pain: Secondary | ICD-10-CM | POA: Diagnosis not present

## 2017-02-12 DIAGNOSIS — F0150 Vascular dementia without behavioral disturbance: Secondary | ICD-10-CM | POA: Diagnosis not present

## 2017-02-12 DIAGNOSIS — E119 Type 2 diabetes mellitus without complications: Secondary | ICD-10-CM | POA: Diagnosis not present

## 2017-02-12 DIAGNOSIS — I10 Essential (primary) hypertension: Secondary | ICD-10-CM | POA: Diagnosis not present

## 2017-02-12 DIAGNOSIS — E785 Hyperlipidemia, unspecified: Secondary | ICD-10-CM | POA: Diagnosis not present

## 2017-02-12 DIAGNOSIS — Z8673 Personal history of transient ischemic attack (TIA), and cerebral infarction without residual deficits: Secondary | ICD-10-CM | POA: Diagnosis not present

## 2017-02-12 DIAGNOSIS — I2510 Atherosclerotic heart disease of native coronary artery without angina pectoris: Secondary | ICD-10-CM | POA: Diagnosis not present

## 2017-02-12 DIAGNOSIS — K219 Gastro-esophageal reflux disease without esophagitis: Secondary | ICD-10-CM | POA: Diagnosis not present

## 2017-02-15 ENCOUNTER — Telehealth: Payer: Self-pay | Admitting: Adult Health

## 2017-02-15 ENCOUNTER — Ambulatory Visit: Payer: Medicare HMO | Admitting: Cardiovascular Disease

## 2017-02-15 NOTE — Telephone Encounter (Signed)
[facility] is calling they will not be able to do start of care until 02-16-17

## 2017-02-15 NOTE — Progress Notes (Deleted)
No chief complaint on file.   History of Present Illness: 82 yo male with history of CAD, HTN, HLD, DM who is here today for cardiac follow up. He had been followed in the past by Dr. Riley Kill. His last cath was in 2004 and showed moderate LAD, Diagonal, Circumflex disease with diabetic appearance of vessels. He has been managed medically since then. Exercise stress test 05/31/12 and he exercised for 3 minutes and stopped due to fatigue. He and Dr. Riley Kill discussed arranging a stress myoview but the patient declined. I saw him in June 2016 and he described chest pain that was worrisome for angina but he once again refused testing. He stopped Zetia in August 2017 because of cost. Admitted to [facility] September 2018 with a TIA. Echo September 2018 with normal LV size and function. No valve disease.    He is here today for follow up. The patient denies any chest pain, dyspnea, palpitations, lower extremity edema, orthopnea, PND, dizziness, near syncope or syncope.   Primary Care Provider:  Shirline Frees, NP   Past Medical History:  Diagnosis Date  . Coronary artery disease    Moderate LAD, Diagonal, Circumflex disease by cath 2004  . Dementia   . Diabetes mellitus without complication (HCC)   . Fall at home 07/06/2016  . Hyperlipidemia   . Hypertension     Past Surgical History:  Procedure Laterality Date  . CATARACT EXTRACTION, BILATERAL      Current Outpatient Medications  Medication Sig Dispense Refill  . acetaminophen (TYLENOL) 325 MG tablet Take 2 tablets (650 mg total) by mouth every 6 (six) hours as needed for mild pain (or Fever >/= 101).    Marland Kitchen amoxicillin (AMOXIL) 500 MG capsule Take 1 capsule (500 mg total) by mouth every 8 (eight) hours. X 7 days    . aspirin (SM ASPIRIN ADULT LOW STRENGTH) 81 MG EC tablet Take 1 tablet (81 mg total) by mouth daily. Swallow whole. 90 tablet 2  . atorvastatin (LIPITOR) 80 MG tablet TAKE 1 TABLET BY MOUTH ONCE DAILY (Patient taking differently:  TAKE 80 mg TABLET BY MOUTH ONCE DAILY) 30 tablet 8  . bisoprolol (ZEBETA) 5 MG tablet Take 1 tablet (5 mg total) by mouth 2 (two) times daily. 60 tablet 3  . clopidogrel (PLAVIX) 75 MG tablet TAKE 1 TABLET BY MOUTH ONCE DAILY WITH BREAKFAST (Patient taking differently: TAKE 75 mg TABLET BY MOUTH ONCE DAILY WITH BREAKFAST) 90 tablet 3  . donepezil (ARICEPT) 5 MG tablet TAKE 1 TABLET BY MOUTH EVERY NIGHT AT BEDTIME (Patient taking differently: TAKE 5 mg TABLET BY MOUTH EVERY NIGHT AT BEDTIME) 30 tablet 3  . insulin aspart (NOVOLOG) 100 UNIT/ML injection Inject 0-9 Units into the skin 3 (three) times daily with meals. Sliding scale CBG 70 - 120: 0 units CBG 121 - 150: 1 unit,  CBG 151 - 200: 2 units,  CBG 201 - 250: 3 units,  CBG 251 - 300: 5 units,  CBG 301 - 350: 7 units,  CBG 351 - 400: 9 units   CBG > 400: 9 units and notify your MD 10 mL 11  . Insulin Glargine (LANTUS SOLOSTAR) 100 UNIT/ML Solostar Pen Inject 15 Units into the skin every morning. 30 mL 11  . losartan (COZAAR) 50 MG tablet Take 1 tablet (50 mg total) by mouth daily. 90 tablet 0  . ondansetron (ZOFRAN) 4 MG tablet Take 1 tablet (4 mg total) by mouth every 8 (eight) hours as needed for nausea  or vomiting. 20 tablet 0  . pantoprazole (PROTONIX) 40 MG tablet Take 1 tablet (40 mg total) by mouth daily. 90 tablet 0  . traMADol (ULTRAM) 50 MG tablet Take 1 tablet (50 mg total) by mouth 2 (two) times daily as needed for severe pain. 20 tablet 0   No current facility-administered medications for this visit.     Allergies  Allergen Reactions  . Hydrocodone-Acetaminophen Other (See Comments)     Delusions    Social History   Socioeconomic History  . Marital status: Married    Spouse name: Not on file  . Number of children: 4  . Years of education: IT trainer  . Highest education level: Not on file  Social Needs  . Financial resource strain: Not on file  . Food insecurity - worry: Not on file  . Food insecurity - inability: Not on file    . Transportation needs - medical: Not on file  . Transportation needs - non-medical: Not on file  Occupational History  . Occupation: Airline pilot    Comment: Retired  Tobacco Use  . Smoking status: Former Smoker    Years: 3.00    Last attempt to quit: 04/13/1965    Years since quitting: 51.8  . Smokeless tobacco: Never Used  Substance and Sexual Activity  . Alcohol use: No    Alcohol/week: 0.0 oz  . Drug use: No  . Sexual activity: No  Other Topics Concern  . Not on file  Social History Narrative   Lives with wife in a one story home.   Retired IT trainer.   Right-handed   Caffeine: 1 cup per day    Family History  Problem Relation Age of Onset  . Other Father        Deceased  . Other Mother        Deceased, 70  . Other Sister        Deceased  . Cancer Sister        Deceased  . Healthy Son        Living  . Heart disease Son        Deceased, 30 from heart valve dysfunction  . Healthy Daughter     Review of Systems:  As stated in the HPI and otherwise negative.   There were no vitals taken for this visit.  Physical Examination:  General: Well developed, well nourished, NAD  HEENT: OP clear, mucus membranes moist  SKIN: warm, dry. No rashes. Neuro: No focal deficits  Musculoskeletal: Muscle strength 5/5 all ext  Psychiatric: Mood and affect normal  Neck: No JVD, no carotid bruits, no thyromegaly, no lymphadenopathy.  Lungs:Clear bilaterally, no wheezes, rhonci, crackles Cardiovascular: Regular rate and rhythm. No murmurs, gallops or rubs. Abdomen:Soft. Bowel sounds present. Non-tender.  Extremities: No lower extremity edema. Pulses are 2 + in the bilateral DP/PT.  Cardiac cath December 2004: 1. Ventriculography was performed in the RAO projection. Overall systolic  function was mildly reduced. Ejection fraction was calculated at 47% but  appeared to be visually better. There was hypokinesis of the inferobasal  segment. No significant mitral regurgitation was  noted.  2. The left main coronary artery was free of critical disease.  3. The left anterior descending artery coursed to the apex. The LAD and its  branch had a fairly typical diabetic appearance with multiple areas of  luminal irregularity. Beyond the origin of the major diagonal branch was  a 40-50% area of focal stenosis that was slightly hypodense that appeared  to be less severe in some views than others. There was also a 30-40%  ostial narrowing of the major diagonal branch. There was moderate  plaquing noted at the apical portion of the LAD as well; critical  stenoses, however, were not noted.  4. The circumflex is a dominant vessel. There is a first marginal branch  that has a typical diabetic appearance and a focal area of about 60%  narrowing. There is also a second marginal branch that bifurcates  distally and again has diffuse luminal irregularities compatible with  diabetes. The A-V circumflex courses to the posterior wall, where it  supplies two posterolateral branches and then is totally occluded,  functioning as a posterior descending branch. In the initial RAO films,  there was evidence of some collateralization of the distal PDA, and this  had an appearance of being somewhat diffusely irregular.  5. The right coronary artery was a nondominant vessel that was free of  critical disease.  CONCLUSIONS:  1. Mild reduction in left ventricular function with an inferobasal wall  motion abnormality but with hypokinesis and not akinesis.  2. Currently normal electrocardiogram.  3. Total occlusion of the distal circumflex prior to the posterior  descending branch with early collateralization of this vessel.  4. Moderate disease of the left anterior descending and diagonal system.  5. Typical diabetic appearance of the coronary arteries.  Echo 01/07/17: - Left ventricle: The cavity size was normal. Wall thickness was   normal. Systolic function was normal. The estimated  ejection   fraction was in the range of 55% to 60%. Wall motion was normal;   there were no regional wall motion abnormalities. Doppler   parameters are consistent with abnormal left ventricular   relaxation (grade 1 diastolic dysfunction).  EKG:  EKG is ***  ordered today. The ekg ordered today demonstrates   Recent Labs: 01/06/2017: ALT 15 01/07/2017: BUN 10; Creatinine, Ser 1.01; Hemoglobin 12.4; Platelets 139; Potassium 3.3; Sodium 138   Lipid Panel    Component Value Date/Time   CHOL 158 01/07/2017 0554   TRIG 148 01/07/2017 0554   HDL 31 (L) 01/07/2017 0554   CHOLHDL 5.1 01/07/2017 0554   VLDL 30 01/07/2017 0554   LDLCALC 97 01/07/2017 0554     Wt Readings from Last 3 Encounters:  12/09/16 197 lb (89.4 kg)  11/16/16 207 lb 3.2 oz (94 kg)  10/12/16 200 lb 8 oz (90.9 kg)     Other studies Reviewed: Additional studies/ records that were reviewed today include:  Review of the above records demonstrates:    Assessment and Plan:   1. CAD without angina: No chest pain suggestive of angina. LV function normal by echo September 2018. Continue DAPT with ASA and Plavix. Continue statin and beta blocker.   2. Hyperlipidemia: Lipids followed in primary care. Continue statin.    3. HTN: BP controlled. No changes.   Current medicines are reviewed at length with the patient today.  The patient does not have concerns regarding medicines.  The following changes have been made:  no change  Labs/ tests ordered today include:   No orders of the defined types were placed in this encounter.   Disposition:   FU with me in 12 months  Signed, Verne Carrowhristopher Arista Kettlewell, MD 02/15/2017 11:50 AM    [facility] [address], [place], [place]  [address] Phone: [phone]; Fax: [phone]

## 2017-02-16 ENCOUNTER — Encounter: Payer: Self-pay | Admitting: Cardiovascular Disease

## 2017-02-16 ENCOUNTER — Telehealth: Payer: Self-pay | Admitting: Cardiovascular Disease

## 2017-02-16 NOTE — Telephone Encounter (Signed)
Spoke with mildred, referred to patients medical doctor for orders.

## 2017-02-16 NOTE — Telephone Encounter (Signed)
Noted  

## 2017-02-16 NOTE — Telephone Encounter (Signed)
Rhunette CroftMildred [facility] is calling to orders to start  PT .Marland Kitchen.Please [facility] the [facility] [place] [facility]. FAx # his [phone]. She will see the patient on tomorrow

## 2017-02-17 ENCOUNTER — Telehealth: Payer: Self-pay | Admitting: Adult Health

## 2017-02-17 NOTE — Telephone Encounter (Signed)
ERROR/NJR °

## 2017-02-19 DIAGNOSIS — E119 Type 2 diabetes mellitus without complications: Secondary | ICD-10-CM | POA: Diagnosis not present

## 2017-02-19 DIAGNOSIS — I2510 Atherosclerotic heart disease of native coronary artery without angina pectoris: Secondary | ICD-10-CM | POA: Diagnosis not present

## 2017-02-19 DIAGNOSIS — Z8673 Personal history of transient ischemic attack (TIA), and cerebral infarction without residual deficits: Secondary | ICD-10-CM | POA: Diagnosis not present

## 2017-02-19 DIAGNOSIS — Z87440 Personal history of urinary (tract) infections: Secondary | ICD-10-CM | POA: Diagnosis not present

## 2017-02-19 DIAGNOSIS — R262 Difficulty in walking, not elsewhere classified: Secondary | ICD-10-CM | POA: Diagnosis not present

## 2017-02-19 DIAGNOSIS — G3184 Mild cognitive impairment, so stated: Secondary | ICD-10-CM | POA: Diagnosis not present

## 2017-02-19 DIAGNOSIS — M6281 Muscle weakness (generalized): Secondary | ICD-10-CM | POA: Diagnosis not present

## 2017-02-19 DIAGNOSIS — I10 Essential (primary) hypertension: Secondary | ICD-10-CM | POA: Diagnosis not present

## 2017-02-19 DIAGNOSIS — Z794 Long term (current) use of insulin: Secondary | ICD-10-CM | POA: Diagnosis not present

## 2017-02-23 ENCOUNTER — Telehealth: Payer: Self-pay | Admitting: *Deleted

## 2017-02-23 ENCOUNTER — Ambulatory Visit: Payer: Medicare HMO | Admitting: Adult Health

## 2017-02-23 DIAGNOSIS — F419 Anxiety disorder, unspecified: Secondary | ICD-10-CM | POA: Diagnosis not present

## 2017-02-23 NOTE — Telephone Encounter (Signed)
Patient was no show for follow up with NP today.  

## 2017-02-24 ENCOUNTER — Encounter: Payer: Self-pay | Admitting: Adult Health

## 2017-02-24 DIAGNOSIS — I2510 Atherosclerotic heart disease of native coronary artery without angina pectoris: Secondary | ICD-10-CM | POA: Diagnosis not present

## 2017-02-24 DIAGNOSIS — Z794 Long term (current) use of insulin: Secondary | ICD-10-CM | POA: Diagnosis not present

## 2017-02-24 DIAGNOSIS — Z8673 Personal history of transient ischemic attack (TIA), and cerebral infarction without residual deficits: Secondary | ICD-10-CM | POA: Diagnosis not present

## 2017-02-24 DIAGNOSIS — R262 Difficulty in walking, not elsewhere classified: Secondary | ICD-10-CM | POA: Diagnosis not present

## 2017-02-24 DIAGNOSIS — G3184 Mild cognitive impairment, so stated: Secondary | ICD-10-CM | POA: Diagnosis not present

## 2017-02-24 DIAGNOSIS — M6281 Muscle weakness (generalized): Secondary | ICD-10-CM | POA: Diagnosis not present

## 2017-02-24 DIAGNOSIS — I10 Essential (primary) hypertension: Secondary | ICD-10-CM | POA: Diagnosis not present

## 2017-02-24 DIAGNOSIS — Z87440 Personal history of urinary (tract) infections: Secondary | ICD-10-CM | POA: Diagnosis not present

## 2017-02-24 DIAGNOSIS — E119 Type 2 diabetes mellitus without complications: Secondary | ICD-10-CM | POA: Diagnosis not present

## 2017-02-26 DIAGNOSIS — G3184 Mild cognitive impairment, so stated: Secondary | ICD-10-CM | POA: Diagnosis not present

## 2017-02-26 DIAGNOSIS — Z87440 Personal history of urinary (tract) infections: Secondary | ICD-10-CM | POA: Diagnosis not present

## 2017-02-26 DIAGNOSIS — I10 Essential (primary) hypertension: Secondary | ICD-10-CM | POA: Diagnosis not present

## 2017-02-26 DIAGNOSIS — E119 Type 2 diabetes mellitus without complications: Secondary | ICD-10-CM | POA: Diagnosis not present

## 2017-02-26 DIAGNOSIS — Z794 Long term (current) use of insulin: Secondary | ICD-10-CM | POA: Diagnosis not present

## 2017-02-26 DIAGNOSIS — R262 Difficulty in walking, not elsewhere classified: Secondary | ICD-10-CM | POA: Diagnosis not present

## 2017-02-26 DIAGNOSIS — Z8673 Personal history of transient ischemic attack (TIA), and cerebral infarction without residual deficits: Secondary | ICD-10-CM | POA: Diagnosis not present

## 2017-02-26 DIAGNOSIS — I2510 Atherosclerotic heart disease of native coronary artery without angina pectoris: Secondary | ICD-10-CM | POA: Diagnosis not present

## 2017-02-26 DIAGNOSIS — M6281 Muscle weakness (generalized): Secondary | ICD-10-CM | POA: Diagnosis not present

## 2017-03-01 DIAGNOSIS — E119 Type 2 diabetes mellitus without complications: Secondary | ICD-10-CM | POA: Diagnosis not present

## 2017-03-01 DIAGNOSIS — Z794 Long term (current) use of insulin: Secondary | ICD-10-CM | POA: Diagnosis not present

## 2017-03-01 DIAGNOSIS — M6281 Muscle weakness (generalized): Secondary | ICD-10-CM | POA: Diagnosis not present

## 2017-03-01 DIAGNOSIS — G3184 Mild cognitive impairment, so stated: Secondary | ICD-10-CM | POA: Diagnosis not present

## 2017-03-01 DIAGNOSIS — I10 Essential (primary) hypertension: Secondary | ICD-10-CM | POA: Diagnosis not present

## 2017-03-01 DIAGNOSIS — R262 Difficulty in walking, not elsewhere classified: Secondary | ICD-10-CM | POA: Diagnosis not present

## 2017-03-01 DIAGNOSIS — Z8673 Personal history of transient ischemic attack (TIA), and cerebral infarction without residual deficits: Secondary | ICD-10-CM | POA: Diagnosis not present

## 2017-03-01 DIAGNOSIS — Z87440 Personal history of urinary (tract) infections: Secondary | ICD-10-CM | POA: Diagnosis not present

## 2017-03-01 DIAGNOSIS — I2510 Atherosclerotic heart disease of native coronary artery without angina pectoris: Secondary | ICD-10-CM | POA: Diagnosis not present

## 2017-03-03 DIAGNOSIS — I10 Essential (primary) hypertension: Secondary | ICD-10-CM | POA: Diagnosis not present

## 2017-03-03 DIAGNOSIS — Z8673 Personal history of transient ischemic attack (TIA), and cerebral infarction without residual deficits: Secondary | ICD-10-CM | POA: Diagnosis not present

## 2017-03-03 DIAGNOSIS — M6281 Muscle weakness (generalized): Secondary | ICD-10-CM | POA: Diagnosis not present

## 2017-03-03 DIAGNOSIS — E119 Type 2 diabetes mellitus without complications: Secondary | ICD-10-CM | POA: Diagnosis not present

## 2017-03-03 DIAGNOSIS — Z794 Long term (current) use of insulin: Secondary | ICD-10-CM | POA: Diagnosis not present

## 2017-03-03 DIAGNOSIS — Z87440 Personal history of urinary (tract) infections: Secondary | ICD-10-CM | POA: Diagnosis not present

## 2017-03-03 DIAGNOSIS — G3184 Mild cognitive impairment, so stated: Secondary | ICD-10-CM | POA: Diagnosis not present

## 2017-03-03 DIAGNOSIS — R262 Difficulty in walking, not elsewhere classified: Secondary | ICD-10-CM | POA: Diagnosis not present

## 2017-03-03 DIAGNOSIS — I2510 Atherosclerotic heart disease of native coronary artery without angina pectoris: Secondary | ICD-10-CM | POA: Diagnosis not present

## 2017-03-10 DIAGNOSIS — I10 Essential (primary) hypertension: Secondary | ICD-10-CM | POA: Diagnosis not present

## 2017-03-10 DIAGNOSIS — M6281 Muscle weakness (generalized): Secondary | ICD-10-CM | POA: Diagnosis not present

## 2017-03-10 DIAGNOSIS — Z794 Long term (current) use of insulin: Secondary | ICD-10-CM | POA: Diagnosis not present

## 2017-03-10 DIAGNOSIS — Z87440 Personal history of urinary (tract) infections: Secondary | ICD-10-CM | POA: Diagnosis not present

## 2017-03-10 DIAGNOSIS — G3184 Mild cognitive impairment, so stated: Secondary | ICD-10-CM | POA: Diagnosis not present

## 2017-03-10 DIAGNOSIS — I2510 Atherosclerotic heart disease of native coronary artery without angina pectoris: Secondary | ICD-10-CM | POA: Diagnosis not present

## 2017-03-10 DIAGNOSIS — R262 Difficulty in walking, not elsewhere classified: Secondary | ICD-10-CM | POA: Diagnosis not present

## 2017-03-10 DIAGNOSIS — Z8673 Personal history of transient ischemic attack (TIA), and cerebral infarction without residual deficits: Secondary | ICD-10-CM | POA: Diagnosis not present

## 2017-03-10 DIAGNOSIS — E119 Type 2 diabetes mellitus without complications: Secondary | ICD-10-CM | POA: Diagnosis not present

## 2017-03-12 DIAGNOSIS — F0150 Vascular dementia without behavioral disturbance: Secondary | ICD-10-CM | POA: Diagnosis not present

## 2017-03-12 DIAGNOSIS — G8929 Other chronic pain: Secondary | ICD-10-CM | POA: Diagnosis not present

## 2017-03-12 DIAGNOSIS — I2510 Atherosclerotic heart disease of native coronary artery without angina pectoris: Secondary | ICD-10-CM | POA: Diagnosis not present

## 2017-03-12 DIAGNOSIS — I10 Essential (primary) hypertension: Secondary | ICD-10-CM | POA: Diagnosis not present

## 2017-03-12 DIAGNOSIS — E785 Hyperlipidemia, unspecified: Secondary | ICD-10-CM | POA: Diagnosis not present

## 2017-03-12 DIAGNOSIS — E119 Type 2 diabetes mellitus without complications: Secondary | ICD-10-CM | POA: Diagnosis not present

## 2017-03-12 DIAGNOSIS — Z8673 Personal history of transient ischemic attack (TIA), and cerebral infarction without residual deficits: Secondary | ICD-10-CM | POA: Diagnosis not present

## 2017-03-12 DIAGNOSIS — K219 Gastro-esophageal reflux disease without esophagitis: Secondary | ICD-10-CM | POA: Diagnosis not present

## 2017-03-16 DIAGNOSIS — F419 Anxiety disorder, unspecified: Secondary | ICD-10-CM | POA: Diagnosis not present

## 2017-03-17 DIAGNOSIS — I2510 Atherosclerotic heart disease of native coronary artery without angina pectoris: Secondary | ICD-10-CM | POA: Diagnosis not present

## 2017-03-17 DIAGNOSIS — Z8673 Personal history of transient ischemic attack (TIA), and cerebral infarction without residual deficits: Secondary | ICD-10-CM | POA: Diagnosis not present

## 2017-03-17 DIAGNOSIS — E119 Type 2 diabetes mellitus without complications: Secondary | ICD-10-CM | POA: Diagnosis not present

## 2017-03-17 DIAGNOSIS — Z794 Long term (current) use of insulin: Secondary | ICD-10-CM | POA: Diagnosis not present

## 2017-03-17 DIAGNOSIS — G3184 Mild cognitive impairment, so stated: Secondary | ICD-10-CM | POA: Diagnosis not present

## 2017-03-17 DIAGNOSIS — M6281 Muscle weakness (generalized): Secondary | ICD-10-CM | POA: Diagnosis not present

## 2017-03-17 DIAGNOSIS — R262 Difficulty in walking, not elsewhere classified: Secondary | ICD-10-CM | POA: Diagnosis not present

## 2017-03-17 DIAGNOSIS — I10 Essential (primary) hypertension: Secondary | ICD-10-CM | POA: Diagnosis not present

## 2017-03-17 DIAGNOSIS — Z87440 Personal history of urinary (tract) infections: Secondary | ICD-10-CM | POA: Diagnosis not present

## 2017-03-23 DIAGNOSIS — F419 Anxiety disorder, unspecified: Secondary | ICD-10-CM | POA: Diagnosis not present

## 2017-03-24 DIAGNOSIS — F338 Other recurrent depressive disorders: Secondary | ICD-10-CM | POA: Diagnosis not present

## 2017-03-24 DIAGNOSIS — F0150 Vascular dementia without behavioral disturbance: Secondary | ICD-10-CM | POA: Diagnosis not present

## 2017-03-29 DIAGNOSIS — B351 Tinea unguium: Secondary | ICD-10-CM | POA: Diagnosis not present

## 2017-03-29 DIAGNOSIS — E119 Type 2 diabetes mellitus without complications: Secondary | ICD-10-CM | POA: Diagnosis not present

## 2017-03-29 DIAGNOSIS — M2010 Hallux valgus (acquired), unspecified foot: Secondary | ICD-10-CM | POA: Diagnosis not present

## 2017-03-29 DIAGNOSIS — L853 Xerosis cutis: Secondary | ICD-10-CM | POA: Diagnosis not present

## 2017-03-29 DIAGNOSIS — R600 Localized edema: Secondary | ICD-10-CM | POA: Diagnosis not present

## 2017-03-29 DIAGNOSIS — I739 Peripheral vascular disease, unspecified: Secondary | ICD-10-CM | POA: Diagnosis not present

## 2017-03-29 DIAGNOSIS — L603 Nail dystrophy: Secondary | ICD-10-CM | POA: Diagnosis not present

## 2017-03-29 DIAGNOSIS — Q845 Enlarged and hypertrophic nails: Secondary | ICD-10-CM | POA: Diagnosis not present

## 2017-03-30 DIAGNOSIS — F419 Anxiety disorder, unspecified: Secondary | ICD-10-CM | POA: Diagnosis not present

## 2017-04-09 DIAGNOSIS — K219 Gastro-esophageal reflux disease without esophagitis: Secondary | ICD-10-CM | POA: Diagnosis not present

## 2017-04-09 DIAGNOSIS — E785 Hyperlipidemia, unspecified: Secondary | ICD-10-CM | POA: Diagnosis not present

## 2017-04-09 DIAGNOSIS — Z8673 Personal history of transient ischemic attack (TIA), and cerebral infarction without residual deficits: Secondary | ICD-10-CM | POA: Diagnosis not present

## 2017-04-09 DIAGNOSIS — E119 Type 2 diabetes mellitus without complications: Secondary | ICD-10-CM | POA: Diagnosis not present

## 2017-04-09 DIAGNOSIS — G8929 Other chronic pain: Secondary | ICD-10-CM | POA: Diagnosis not present

## 2017-04-09 DIAGNOSIS — I2510 Atherosclerotic heart disease of native coronary artery without angina pectoris: Secondary | ICD-10-CM | POA: Diagnosis not present

## 2017-04-09 DIAGNOSIS — I10 Essential (primary) hypertension: Secondary | ICD-10-CM | POA: Diagnosis not present

## 2017-04-19 ENCOUNTER — Encounter (HOSPITAL_COMMUNITY): Payer: Self-pay | Admitting: Emergency Medicine

## 2017-04-19 ENCOUNTER — Emergency Department (HOSPITAL_COMMUNITY)
Admission: EM | Admit: 2017-04-19 | Discharge: 2017-04-20 | Disposition: A | Discharge status: Home / Self Care | Payer: Medicare HMO | Attending: Emergency Medicine | Admitting: Emergency Medicine

## 2017-04-19 DIAGNOSIS — Y999 Unspecified external cause status: Secondary | ICD-10-CM | POA: Diagnosis not present

## 2017-04-19 DIAGNOSIS — Z794 Long term (current) use of insulin: Secondary | ICD-10-CM | POA: Diagnosis not present

## 2017-04-19 DIAGNOSIS — F0390 Unspecified dementia without behavioral disturbance: Secondary | ICD-10-CM | POA: Diagnosis not present

## 2017-04-19 DIAGNOSIS — Y92129 Unspecified place in nursing home as the place of occurrence of the external cause: Secondary | ICD-10-CM | POA: Diagnosis not present

## 2017-04-19 DIAGNOSIS — Z79899 Other long term (current) drug therapy: Secondary | ICD-10-CM | POA: Insufficient documentation

## 2017-04-19 DIAGNOSIS — S39012A Strain of muscle, fascia and tendon of lower back, initial encounter: Secondary | ICD-10-CM | POA: Insufficient documentation

## 2017-04-19 DIAGNOSIS — Z7982 Long term (current) use of aspirin: Secondary | ICD-10-CM | POA: Insufficient documentation

## 2017-04-19 DIAGNOSIS — I10 Essential (primary) hypertension: Secondary | ICD-10-CM | POA: Diagnosis not present

## 2017-04-19 DIAGNOSIS — Z87891 Personal history of nicotine dependence: Secondary | ICD-10-CM | POA: Insufficient documentation

## 2017-04-19 DIAGNOSIS — I2510 Atherosclerotic heart disease of native coronary artery without angina pectoris: Secondary | ICD-10-CM | POA: Insufficient documentation

## 2017-04-19 DIAGNOSIS — T148XXA Other injury of unspecified body region, initial encounter: Secondary | ICD-10-CM | POA: Diagnosis not present

## 2017-04-19 DIAGNOSIS — E119 Type 2 diabetes mellitus without complications: Secondary | ICD-10-CM | POA: Diagnosis not present

## 2017-04-19 DIAGNOSIS — M545 Low back pain: Secondary | ICD-10-CM | POA: Diagnosis present

## 2017-04-19 DIAGNOSIS — Y9301 Activity, walking, marching and hiking: Secondary | ICD-10-CM | POA: Insufficient documentation

## 2017-04-19 DIAGNOSIS — W1830XA Fall on same level, unspecified, initial encounter: Secondary | ICD-10-CM | POA: Diagnosis not present

## 2017-04-19 DIAGNOSIS — W19XXXA Unspecified fall, initial encounter: Secondary | ICD-10-CM

## 2017-04-19 DIAGNOSIS — M5489 Other dorsalgia: Secondary | ICD-10-CM | POA: Diagnosis not present

## 2017-04-19 NOTE — ED Provider Notes (Signed)
[facility]-EMERGENCY DEPT Provider Note   CSN: [id number] Arrival date & time: 04/19/17  2019     History   Chief Complaint Chief Complaint  Patient presents with  . Back Pain  . Fall    HPI TALTON DELPRIORE is a 83 y.o. male.  Is here for evaluation of fall with back injury, after his right knee gave way while walking.  The patient is a poor historian.  He told me that he slipped out of bed today injuring his back.  He apparently told the nurse the explanation given above.  Patient denies other problems.  Level 5 caveat-poor historian  HPI  Past Medical History:  Diagnosis Date  . Coronary artery disease    Moderate LAD, Diagonal, Circumflex disease by cath 2004  . Dementia   . Diabetes mellitus without complication (HCC)   . Fall at home 07/06/2016  . Hyperlipidemia   . Hypertension     Patient Active Problem List   Diagnosis Date Noted  . TIA (transient ischemic attack) 01/07/2017  . UTI (urinary tract infection) 01/07/2017  . Stroke-like symptoms 01/06/2017  . Right knee pain 07/08/2016  . Unable to bear weight 07/08/2016  . Hypoglycemia 07/08/2016  . Upper airway cough syndrome 01/31/2015  . Dyspnea 01/31/2015  . Special screening for malignant neoplasms, colon 06/01/2013  . Mild cognitive impairment with memory loss 06/01/2013  . Unsteady gait 05/08/2013  . Hyperlipidemia 08/18/2008  . Essential hypertension 08/18/2008  . INTRAOCULAR LENS IMPLANT, HX OF 08/18/2008  . CATARACT EXTRACTION, RIGHT EYE, HX OF 08/18/2008  . Diabetes mellitus (HCC) 08/17/2008  . CORONARY ATHEROSCLEROSIS NATIVE CORONARY ARTERY 08/13/2008    Past Surgical History:  Procedure Laterality Date  . CATARACT EXTRACTION, BILATERAL         Home Medications    Prior to Admission medications   Medication Sig Start Date End Date Taking? Authorizing Provider  aspirin (SM ASPIRIN ADULT LOW STRENGTH) 81 MG EC tablet Take 1 tablet (81 mg total) by mouth daily.  Swallow whole. 01/08/17  Yes Rai, Ripudeep K, MD  atorvastatin (LIPITOR) 80 MG tablet TAKE 1 TABLET BY MOUTH ONCE DAILY Patient taking differently: TAKE 80 mg TABLET BY MOUTH ONCE DAILY 05/22/16  Yes Kathleene Hazel, MD  bisoprolol (ZEBETA) 5 MG tablet Take 1 tablet (5 mg total) by mouth 2 (two) times daily. Patient taking differently: Take 5 mg by mouth daily.  12/04/16  Yes Nafziger, Kandee Keen, NP  clopidogrel (PLAVIX) 75 MG tablet TAKE 1 TABLET BY MOUTH ONCE DAILY WITH BREAKFAST Patient taking differently: TAKE 75 mg TABLET BY MOUTH ONCE DAILY WITH BREAKFAST 01/02/16  Yes Kathleene Hazel, MD  donepezil (ARICEPT) 5 MG tablet TAKE 1 TABLET BY MOUTH EVERY NIGHT AT BEDTIME Patient taking differently: TAKE 5 mg TABLET BY MOUTH EVERY NIGHT AT BEDTIME 12/03/16  Yes York Spaniel, MD  insulin aspart (NOVOLOG) 100 UNIT/ML injection Inject 0-9 Units into the skin 3 (three) times daily with meals. Sliding scale CBG 70 - 120: 0 units CBG 121 - 150: 1 unit,  CBG 151 - 200: 2 units,  CBG 201 - 250: 3 units,  CBG 251 - 300: 5 units,  CBG 301 - 350: 7 units,  CBG 351 - 400: 9 units   CBG > 400: 9 units and notify your MD 01/08/17  Yes Rai, Ripudeep K, MD  LEVEMIR FLEXTOUCH 100 UNIT/ML Pen Inject 15 Units into the skin every morning.  04/15/17  Yes [provider]  losartan (COZAAR) 50 MG tablet Take 1 tablet (50 mg total) by mouth daily. 12/04/16  Yes Nafziger, Kandee Keen, NP  pantoprazole (PROTONIX) 40 MG tablet Take 1 tablet (40 mg total) by mouth daily. 01/13/17  Yes Kathleene Hazel, MD  traMADol (ULTRAM) 50 MG tablet Take 1 tablet (50 mg total) by mouth 2 (two) times daily as needed for severe pain. 01/08/17  Yes Rai, Ripudeep K, MD  acetaminophen (TYLENOL) 325 MG tablet Take 2 tablets (650 mg total) by mouth every 6 (six) hours as needed for mild pain (or Fever >/= 101). 07/09/16   Johnson, Clanford L, MD  amoxicillin (AMOXIL) 500 MG capsule Take 1 capsule (500 mg total) by mouth every 8 (eight)  hours. X 7 days Patient not taking: Reported on 04/19/2017 01/08/17   Rai, Delene Ruffini, MD  Insulin Glargine (LANTUS SOLOSTAR) 100 UNIT/ML Solostar Pen Inject 15 Units into the skin every morning. Patient not taking: Reported on 04/19/2017 01/08/17   Cathren Harsh, MD  NOVOLOG FLEXPEN 100 UNIT/ML FlexPen  01/29/17   [provider]  ondansetron (ZOFRAN) 4 MG tablet Take 1 tablet (4 mg total) by mouth every 8 (eight) hours as needed for nausea or vomiting. Patient not taking: Reported on 04/19/2017 01/07/17   Shirline Frees, NP    Family History Family History  Problem Relation Age of Onset  . Other Father        Deceased  . Other Mother        Deceased, 75  . Other Sister        Deceased  . Cancer Sister        Deceased  . Healthy Son        Living  . Heart disease Son        Deceased, 30 from heart valve dysfunction  . Healthy Daughter     Social History Social History   Tobacco Use  . Smoking status: Former Smoker    Years: 3.00    Last attempt to quit: 04/13/1965    Years since quitting: 52.0  . Smokeless tobacco: Never Used  Substance Use Topics  . Alcohol use: No    Alcohol/week: 0.0 oz  . Drug use: No     Allergies   Hydrocodone-acetaminophen   Review of Systems Review of Systems  Unable to perform ROS: Other     Physical Exam Updated Vital Signs BP (!) 139/97 (BP Location: Left Arm)   Pulse 62   Temp 98 F (36.7 C)   Resp 18   SpO2 98%   Physical Exam  Constitutional: He is oriented to person, place, and time. He appears well-developed and well-nourished. No distress.  HENT:  Head: Normocephalic and atraumatic.  Right Ear: External ear normal.  Left Ear: External ear normal.  Eyes: Conjunctivae and EOM are normal. Pupils are equal, round, and reactive to light.  Neck: Normal range of motion and phonation normal. Neck supple.  Cardiovascular: Normal rate and regular rhythm.  Pulmonary/Chest: Effort normal. He exhibits no bony tenderness.    Abdominal: Soft. There is no tenderness.  Musculoskeletal: Normal range of motion.  Normal active and passive range of motion, bilaterally.  Neurological: He is alert and oriented to person, place, and time. No cranial nerve deficit or sensory deficit. He exhibits normal muscle tone. Coordination normal.  Skin: Skin is warm, dry and intact.  Psychiatric: He has a normal mood and affect. His behavior is normal. Judgment and thought content normal.  Nursing note and vitals  reviewed.    ED Treatments / Results  Labs (all labs ordered are listed, but only abnormal results are displayed) Labs Reviewed - No data to display  EKG  EKG Interpretation None       Radiology No results found.  Procedures Procedures (including critical care time)  Medications Ordered in ED Medications - No data to display   Initial Impression / Assessment and Plan / ED Course  I have reviewed the triage vital signs and the nursing notes.  Pertinent labs & imaging results that were available during my care of the patient were reviewed by me and considered in my medical decision making (see chart for details).      Patient Vitals for the past 24 hrs:  BP Temp Pulse Resp SpO2  04/19/17 2032 (!) 139/97 98 F (36.7 C) 62 18 98 %    9:41 PM Reevaluation with update and discussion. After initial assessment and treatment, an updated evaluation reveals no change in clinical status.  Findings discussed with the patient. Mancel BaleElliott Tahlia Deamer      Final Clinical Impressions(s) / ED Diagnoses   Final diagnoses:  Fall, initial encounter  Muscle strain   Fall without serious injury.   Nursing Notes Reviewed/ Care Coordinated Applicable Imaging Reviewed Interpretation of Laboratory Data incorporated into ED treatment  The patient appears reasonably screened and/or stabilized for discharge and I doubt any other medical condition or other [facility] requiring further screening, evaluation, or treatment in the ED  at this time prior to discharge.  Plan: Home Medications- resume home meds; Home Treatments- rest; return here if the recommended treatment, does not improve the symptoms; Recommended follow up- PCP prn  ED Discharge Orders    None       Mancel BaleWentz, Corryn Madewell, MD 04/19/17 2144

## 2017-04-19 NOTE — ED Notes (Signed)
Bed: WA20 Expected date:  Expected time:  Means of arrival:  Comments: EMS Fall  

## 2017-04-19 NOTE — ED Triage Notes (Signed)
[facility] after an unwitnessed fall states his right knee gave out when attempting to walk to restroom. Denies hitting head or LOC. Patient is on blood thinners. No neck pain. C/o lower right sided back pain.

## 2017-04-19 NOTE — Discharge Instructions (Signed)
Be careful when you stand up and walk.  Use Tylenol if needed for pain.  See your doctor as needed for problems.

## 2017-04-20 ENCOUNTER — Other Ambulatory Visit: Payer: Self-pay

## 2017-04-20 ENCOUNTER — Emergency Department (HOSPITAL_COMMUNITY): Payer: Medicare HMO

## 2017-04-20 ENCOUNTER — Encounter (HOSPITAL_COMMUNITY): Payer: Self-pay | Admitting: Emergency Medicine

## 2017-04-20 ENCOUNTER — Emergency Department (HOSPITAL_COMMUNITY)
Admission: EM | Admit: 2017-04-20 | Discharge: 2017-04-20 | Disposition: A | Discharge status: Home / Self Care | Payer: Medicare HMO | Attending: Emergency Medicine | Admitting: Emergency Medicine

## 2017-04-20 ENCOUNTER — Ambulatory Visit: Payer: Medicare HMO | Admitting: Endocrinology

## 2017-04-20 DIAGNOSIS — Y92121 Bathroom in nursing home as the place of occurrence of the external cause: Secondary | ICD-10-CM | POA: Insufficient documentation

## 2017-04-20 DIAGNOSIS — G8911 Acute pain due to trauma: Secondary | ICD-10-CM | POA: Diagnosis not present

## 2017-04-20 DIAGNOSIS — W06XXXA Fall from bed, initial encounter: Secondary | ICD-10-CM | POA: Diagnosis not present

## 2017-04-20 DIAGNOSIS — I2510 Atherosclerotic heart disease of native coronary artery without angina pectoris: Secondary | ICD-10-CM | POA: Diagnosis not present

## 2017-04-20 DIAGNOSIS — M545 Low back pain: Secondary | ICD-10-CM

## 2017-04-20 DIAGNOSIS — F0390 Unspecified dementia without behavioral disturbance: Secondary | ICD-10-CM | POA: Insufficient documentation

## 2017-04-20 DIAGNOSIS — Y999 Unspecified external cause status: Secondary | ICD-10-CM | POA: Insufficient documentation

## 2017-04-20 DIAGNOSIS — I10 Essential (primary) hypertension: Secondary | ICD-10-CM | POA: Diagnosis not present

## 2017-04-20 DIAGNOSIS — Z7902 Long term (current) use of antithrombotics/antiplatelets: Secondary | ICD-10-CM | POA: Insufficient documentation

## 2017-04-20 DIAGNOSIS — Z794 Long term (current) use of insulin: Secondary | ICD-10-CM | POA: Insufficient documentation

## 2017-04-20 DIAGNOSIS — Z8673 Personal history of transient ischemic attack (TIA), and cerebral infarction without residual deficits: Secondary | ICD-10-CM | POA: Diagnosis not present

## 2017-04-20 DIAGNOSIS — M5440 Lumbago with sciatica, unspecified side: Secondary | ICD-10-CM | POA: Insufficient documentation

## 2017-04-20 DIAGNOSIS — E119 Type 2 diabetes mellitus without complications: Secondary | ICD-10-CM | POA: Insufficient documentation

## 2017-04-20 DIAGNOSIS — Z7982 Long term (current) use of aspirin: Secondary | ICD-10-CM | POA: Insufficient documentation

## 2017-04-20 DIAGNOSIS — M5410 Radiculopathy, site unspecified: Secondary | ICD-10-CM | POA: Diagnosis not present

## 2017-04-20 DIAGNOSIS — M25552 Pain in left hip: Secondary | ICD-10-CM | POA: Diagnosis not present

## 2017-04-20 DIAGNOSIS — Z79899 Other long term (current) drug therapy: Secondary | ICD-10-CM | POA: Diagnosis not present

## 2017-04-20 DIAGNOSIS — Z87891 Personal history of nicotine dependence: Secondary | ICD-10-CM | POA: Diagnosis not present

## 2017-04-20 DIAGNOSIS — R296 Repeated falls: Secondary | ICD-10-CM | POA: Diagnosis not present

## 2017-04-20 DIAGNOSIS — M5416 Radiculopathy, lumbar region: Secondary | ICD-10-CM | POA: Diagnosis not present

## 2017-04-20 DIAGNOSIS — Y9389 Activity, other specified: Secondary | ICD-10-CM | POA: Insufficient documentation

## 2017-04-20 DIAGNOSIS — S0990XA Unspecified injury of head, initial encounter: Secondary | ICD-10-CM | POA: Diagnosis not present

## 2017-04-20 DIAGNOSIS — W19XXXA Unspecified fall, initial encounter: Secondary | ICD-10-CM

## 2017-04-20 LAB — CBC
HEMATOCRIT: 43.5 % (ref 39.0–52.0)
Hemoglobin: 13.9 g/dL (ref 13.0–17.0)
MCH: 26.9 pg (ref 26.0–34.0)
MCHC: 32 g/dL (ref 30.0–36.0)
MCV: 84.1 fL (ref 78.0–100.0)
Platelets: 160 10*3/uL (ref 150–400)
RBC: 5.17 MIL/uL (ref 4.22–5.81)
RDW: 14.1 % (ref 11.5–15.5)
WBC: 8.4 10*3/uL (ref 4.0–10.5)

## 2017-04-20 LAB — BASIC METABOLIC PANEL
Anion gap: 8 (ref 5–15)
BUN: 11 mg/dL (ref 6–20)
CO2: 24 mmol/L (ref 22–32)
Calcium: 9.3 mg/dL (ref 8.9–10.3)
Chloride: 105 mmol/L (ref 101–111)
Creatinine, Ser: 1.09 mg/dL (ref 0.61–1.24)
Glucose, Bld: 90 mg/dL (ref 65–99)
POTASSIUM: 3.5 mmol/L (ref 3.5–5.1)
SODIUM: 137 mmol/L (ref 135–145)

## 2017-04-20 MED ORDER — CYCLOBENZAPRINE HCL 5 MG PO TABS: 5.0000 mg | ORAL_TABLET | Freq: Two times a day (BID) | ORAL | 0 refills | Status: AC | PRN

## 2017-04-20 MED ORDER — ACETAMINOPHEN 500 MG PO TABS
1000.0000 mg | ORAL_TABLET | Freq: Once | ORAL | Status: AC
  Administered 2017-04-20: 1000 mg via ORAL
  Filled 2017-04-20: qty 2

## 2017-04-20 MED ORDER — TRAMADOL HCL 50 MG PO TABS
50.0000 mg | ORAL_TABLET | Freq: Once | ORAL | Status: AC
  Administered 2017-04-20: 50 mg via ORAL
  Filled 2017-04-20: qty 1

## 2017-04-20 NOTE — ED Notes (Signed)
Pt ambulatory in hallway with ED staff utilizing walker

## 2017-04-20 NOTE — Discharge Instructions (Signed)
Take Tylenol 1000 mg 4 times a day for 1 week. This is the maximum dose of Tylenol (acetaminophen) you can take from all sources. Please check other over-the-counter medications and prescriptions to ensure you are not taking other medications that contain acetaminophen.  You may also take ibuprofen 400 mg 6 times a day alternating with or at the same time as tylenol.  °

## 2017-04-20 NOTE — ED Notes (Signed)
ED Provider at bedside. 

## 2017-04-20 NOTE — ED Notes (Signed)
Dr. Schlossman at bedside. 

## 2017-04-20 NOTE — ED Notes (Signed)
Patient verbalizes understanding of discharge instructions. Opportunity for questioning and answers were provided. Armband removed by staff, pt discharged from ED via wheelchair with granddaughter.

## 2017-04-20 NOTE — ED Notes (Signed)
Patient transported to X-ray 

## 2017-04-20 NOTE — ED Triage Notes (Signed)
Pt arrives with daughter from [facility] after falling today bed states from the bed he slipped down into the floor. [facility] Pt reports lower back pain. Pt denies hitting head or any LOC.

## 2017-04-20 NOTE — ED Notes (Signed)
Bed: WHALE Expected date:  Expected time:  Means of arrival:  Comments: 

## 2017-04-20 NOTE — ED Provider Notes (Signed)
[facility] EMERGENC[facility] Provider Note   CSN: [id number] Arrival date & time: 04/20/17  1021     History   Chief Complaint Chief Complaint  Patient presents with  . Fall    HPI William Aguirre is a 83 y.o. male.   83 year old male presents with concern for fall.  He was been seen yesterday in the [facility] for a fall that he experienced while walking.  Reports that he was walking and had sudden pain in his left leg yesterday, felt like it didn't work right and fell.  He reports he has had back pain for about 3 days and that left hip/leg pain has been intermittent.  Denies numbness, abdominal pain, cough, fever, flank pain, urinary symptoms, urinary or stool incontinence. Denies facial droop, arm weakness or numbness.    Today, reports he fell out of bed, sleeps close to the edge and that his mattress is uneven and he ended up sliding out, landing on his bottom. Denies head trauma or headache. No n/v. No neck pain, no other injuries.   Back pain is severe, worse with movements, and pain located into leg at times.   Past Medical History:  Diagnosis Date  . Coronary artery disease    Moderate LAD, Diagonal, Circumflex disease by cath 2004  . Dementia   . Diabetes mellitus without complication (HCC)   . Fall at home 07/06/2016  . Hyperlipidemia   . Hypertension     Patient Active Problem List   Diagnosis Date Noted  . TIA (transient ischemic attack) 01/07/2017  . UTI (urinary tract infection) 01/07/2017  . Stroke-like symptoms 01/06/2017  . Right knee pain 07/08/2016  . Unable to bear weight 07/08/2016  . Hypoglycemia 07/08/2016  . Upper airway cough syndrome 01/31/2015  . Dyspnea 01/31/2015  . Special screening for malignant neoplasms, colon 06/01/2013  . Mild cognitive impairment with memory loss 06/01/2013  . Unsteady gait 05/08/2013  . Hyperlipidemia 08/18/2008  . Essential hypertension 08/18/2008  . INTRAOCULAR LENS IMPLANT, HX  OF 08/18/2008  . CATARACT EXTRACTION, RIGHT EYE, HX OF 08/18/2008  . Diabetes mellitus (HCC) 08/17/2008  . CORONARY ATHEROSCLEROSIS NATIVE CORONARY ARTERY 08/13/2008    Past Surgical History:  Procedure Laterality Date  . CATARACT EXTRACTION, BILATERAL         Home Medications    Prior to Admission medications   Medication Sig Start Date End Date Taking? Authorizing Provider  acetaminophen (TYLENOL) 325 MG tablet Take 2 tablets (650 mg total) by mouth every 6 (six) hours as needed for mild pain (or Fever >/= 101). 07/09/16   Johnson, Clanford L, MD  amoxicillin (AMOXIL) 500 MG capsule Take 1 capsule (500 mg total) by mouth every 8 (eight) hours. X 7 days Patient not taking: Reported on 04/19/2017 01/08/17   Rai, Delene Ruffiniipudeep K, MD  aspirin (SM ASPIRIN ADULT LOW STRENGTH) 81 MG EC tablet Take 1 tablet (81 mg total) by mouth daily. Swallow whole. 01/08/17   Rai, Ripudeep K, MD  atorvastatin (LIPITOR) 80 MG tablet TAKE 1 TABLET BY MOUTH ONCE DAILY Patient taking differently: TAKE 80 mg TABLET BY MOUTH ONCE DAILY 05/22/16   Kathleene HazelMcAlhany, Christopher D, MD  bisoprolol (ZEBETA) 5 MG tablet Take 1 tablet (5 mg total) by mouth 2 (two) times daily. Patient taking differently: Take 5 mg by mouth daily.  12/04/16   Nafziger, Kandee Keenory, NP  clopidogrel (PLAVIX) 75 MG tablet TAKE 1 TABLET BY MOUTH ONCE DAILY WITH BREAKFAST Patient taking differently: TAKE 75  mg TABLET BY MOUTH ONCE DAILY WITH BREAKFAST 01/02/16   Kathleene Hazel, MD  cyclobenzaprine (FLEXERIL) 5 MG tablet Take 1 tablet (5 mg total) by mouth 2 (two) times daily as needed for muscle spasms. 04/20/17   Alvira Monday, MD  donepezil (ARICEPT) 5 MG tablet TAKE 1 TABLET BY MOUTH EVERY NIGHT AT BEDTIME Patient taking differently: TAKE 5 mg TABLET BY MOUTH EVERY NIGHT AT BEDTIME 12/03/16   York Spaniel, MD  insulin aspart (NOVOLOG) 100 UNIT/ML injection Inject 0-9 Units into the skin 3 (three) times daily with meals. Sliding scale CBG 70 - 120: 0  units CBG 121 - 150: 1 unit,  CBG 151 - 200: 2 units,  CBG 201 - 250: 3 units,  CBG 251 - 300: 5 units,  CBG 301 - 350: 7 units,  CBG 351 - 400: 9 units   CBG > 400: 9 units and notify your MD 01/08/17   Rai, Delene Ruffini, MD  Insulin Glargine (LANTUS SOLOSTAR) 100 UNIT/ML Solostar Pen Inject 15 Units into the skin every morning. Patient not taking: Reported on 04/19/2017 01/08/17   Rai, Delene Ruffini, MD  LEVEMIR FLEXTOUCH 100 UNIT/ML Pen Inject 15 Units into the skin every morning.  04/15/17   [provider]  losartan (COZAAR) 50 MG tablet Take 1 tablet (50 mg total) by mouth daily. 12/04/16   Nafziger, Kandee Keen, NP  NOVOLOG FLEXPEN 100 UNIT/ML FlexPen  01/29/17   [provider]  ondansetron (ZOFRAN) 4 MG tablet Take 1 tablet (4 mg total) by mouth every 8 (eight) hours as needed for nausea or vomiting. Patient not taking: Reported on 04/19/2017 01/07/17   Shirline Frees, NP  pantoprazole (PROTONIX) 40 MG tablet Take 1 tablet (40 mg total) by mouth daily. 01/13/17   Kathleene Hazel, MD  traMADol (ULTRAM) 50 MG tablet Take 1 tablet (50 mg total) by mouth 2 (two) times daily as needed for severe pain. 01/08/17   Cathren Harsh, MD    Family History Family History  Problem Relation Age of Onset  . Other Father        Deceased  . Other Mother        Deceased, 69  . Other Sister        Deceased  . Cancer Sister        Deceased  . Healthy Son        Living  . Heart disease Son        Deceased, 30 from heart valve dysfunction  . Healthy Daughter     Social History Social History   Tobacco Use  . Smoking status: Former Smoker    Years: 3.00    Last attempt to quit: 04/13/1965    Years since quitting: 52.0  . Smokeless tobacco: Never Used  Substance Use Topics  . Alcohol use: No    Alcohol/week: 0.0 oz  . Drug use: No     Allergies   Hydrocodone-acetaminophen   Review of Systems Review of Systems  Constitutional: Negative for fever.  HENT: Negative for sore throat.    Eyes: Negative for visual disturbance.  Respiratory: Negative for shortness of breath.   Cardiovascular: Negative for chest pain.  Gastrointestinal: Negative for abdominal pain, diarrhea, nausea and vomiting.  Genitourinary: Negative for difficulty urinating.  Musculoskeletal: Positive for arthralgias and back pain. Negative for neck stiffness.  Skin: Negative for rash.  Neurological: Negative for syncope, facial asymmetry, speech difficulty, weakness, numbness and headaches.     Physical Exam  Updated Vital Signs BP (!) 172/87 (BP Location: Left Arm)   Pulse 71   Temp 97.8 F (36.6 C) (Oral)   Resp 16   Ht 5\' 8"  (1.727 m)   Wt 86.2 kg (190 lb)   SpO2 99%   BMI 28.89 kg/m   Physical Exam  Constitutional: He is oriented to person, place, and time. He appears well-developed and well-nourished. No distress.  HENT:  Head: Normocephalic and atraumatic.  Eyes: Conjunctivae and EOM are normal.  Neck: Normal range of motion.  Cardiovascular: Normal rate, regular rhythm, normal heart sounds and intact distal pulses. Exam reveals no gallop and no friction rub.  No murmur heard. Pulmonary/Chest: Effort normal and breath sounds normal. No respiratory distress. He has no wheezes. He has no rales.  Abdominal: Soft. He exhibits no distension. There is no tenderness. There is no guarding.  No cva tenderness   Musculoskeletal: He exhibits no edema.       Right hip: He exhibits normal range of motion and normal strength.       Left hip: He exhibits normal range of motion and normal strength.       Cervical back: He exhibits no tenderness and no bony tenderness.       Thoracic back: He exhibits no tenderness and no bony tenderness.       Lumbar back: He exhibits tenderness and bony tenderness.  Neurological: He is alert and oriented to person, place, and time. He has normal strength. No sensory deficit.  Skin: Skin is warm and dry. He is not diaphoretic.  Nursing note and vitals  reviewed.    ED Treatments / Results  Labs (all labs ordered are listed, but only abnormal results are displayed) Labs Reviewed  BASIC METABOLIC PANEL  CBC    EKG  EKG Interpretation None       Radiology Dg Lumbar Spine Complete  Result Date: 04/20/2017 CLINICAL DATA:  Multiple falls over the past few days. Low back pain. Initial encounter. EXAM: LUMBAR SPINE - COMPLETE 4+ VIEW COMPARISON:  Two views lumbar spine 05/26/2013 and MRI lumbar spine 05/22/2013. FINDINGS: The patient has mild, remote superior endplate compression fractures of T12 and L1 which were present on the prior exam. No acute fracture. No malalignment. Loss of disc space height and facet arthropathy appear worst at L4-5 and L5-S1. Atherosclerosis noted. IMPRESSION: No acute abnormality. Remote T12 and L1 compression fractures. Lower lumbar spondylosis. Electronically Signed   By: Drusilla Kanner M.D.   On: 04/20/2017 17:39    Procedures Procedures (including critical care time)  Medications Ordered in ED Medications  acetaminophen (TYLENOL) tablet 1,000 mg (1,000 mg Oral Given 04/20/17 1745)  traMADol (ULTRAM) tablet 50 mg (50 mg Oral Given 04/20/17 1745)     Initial Impression / Assessment and Plan / ED Course  I have reviewed the triage vital signs and the nursing notes.  Pertinent labs & imaging results that were available during my care of the patient were reviewed by me and considered in my medical decision making (see chart for details).     83 year old patient with history above presents from [place] after concern for fall out of bed.  He had presented yesterday to [facility] after a fall while walking.  Patient reports that he fell out of bed onto his bottom, denies any head trauma, headache, nausea, vomiting, has no signs of head trauma and doubt intracranial bleed.  Reports pain in back as well as intermittent pain shooting into hip.  He  has good strength on exam, normal neurologic exam and doubt  CVA by history and exam.  No other acute medical concerns at this time.  No other areas of pain or injury. Good pulses bilaterally, normal VS, doubt AAA.  Doubt UTI as etiology of back pain given location, hx of fall, pain with movement. No CVA tenderness. Labs at baseline.   XR of lumbar spine shows no significant findings, prior compression fracture. Doubt hip fracture given intermittent pain, good ROM of hip, patient able to ambulate with only intermittent moments of pain.   Patient able to ambulate independently with use of walker.   Suspect most likely pain from back to left leg is secondary to radicular pain from back, possible herniate disc versus possible muscular strain.  Given rx for flexeril, recommend tylenol and ibuprofen. Rec close PCP follow up. Pt reports he was supposed to see spine surgeon today, and discussed recommendation to reschedule this appointment. Patient discharged in stable condition with understanding of reasons to return.     Final Clinical Impressions(s) / ED Diagnoses   Final diagnoses:  Fall, initial encounter  Acute bilateral low back pain, with sciatica presence unspecified  Left hip pain  Radicular pain    ED Discharge Orders        Ordered    cyclobenzaprine (FLEXERIL) 5 MG tablet  2 times daily PRN     04/20/17 1827       Alvira Monday, MD 04/21/17 [phone]

## 2017-04-27 DIAGNOSIS — F419 Anxiety disorder, unspecified: Secondary | ICD-10-CM | POA: Diagnosis not present

## 2017-04-29 ENCOUNTER — Encounter: Payer: Self-pay | Admitting: Endocrinology

## 2017-04-29 ENCOUNTER — Ambulatory Visit (INDEPENDENT_AMBULATORY_CARE_PROVIDER_SITE_OTHER): Payer: Medicare HMO | Admitting: Endocrinology

## 2017-04-29 VITALS — BP 136/84 | HR 71 | Resp 20

## 2017-04-29 DIAGNOSIS — Z794 Long term (current) use of insulin: Secondary | ICD-10-CM

## 2017-04-29 DIAGNOSIS — E1121 Type 2 diabetes mellitus with diabetic nephropathy: Secondary | ICD-10-CM | POA: Diagnosis not present

## 2017-04-29 LAB — POCT GLYCOSYLATED HEMOGLOBIN (HGB A1C): Hemoglobin A1C: 8.5

## 2017-04-29 MED ORDER — INSULIN ASPART 100 UNIT/ML ~~LOC~~ SOLN: 3.0000 [IU] | Freq: Three times a day (TID) | SUBCUTANEOUS | 11 refills | Status: AC

## 2017-04-29 MED ORDER — INSULIN DETEMIR 100 UNIT/ML FLEXPEN: 20.0000 [IU] | PEN_INJECTOR | SUBCUTANEOUS | 11 refills | Status: DC

## 2017-04-29 MED ORDER — LEVEMIR FLEXTOUCH 100 UNIT/ML ~~LOC~~ SOPN: 15.0000 [IU] | PEN_INJECTOR | SUBCUTANEOUS | 11 refills | Status: DC

## 2017-04-29 NOTE — Patient Instructions (Addendum)
check your blood sugar twice a day.  vary the time of day when you check, between before the 3 meals, and at bedtime.  also check if you have symptoms of your blood sugar being too high or too low.  please keep a record of the readings and bring it to your next appointment here (or you can bring the meter itself).  You can write it on any piece of paper.  please call us sooner if your blood sugar goes below 70, or if you have a lot of readings over 200.  Please increase the lantus to 28 units each morning.   Please give novolog QAC, just 3 units, and only if cbg is over 300.   Please come back for a follow-up appointment in 2 months.

## 2017-04-29 NOTE — Progress Notes (Signed)
Subjective:    Patient ID: William Aguirre, male    DOB: 04-28-1934, 83 y.o.   MRN: [id number]  HPI Pt returns for f/u of diabetes mellitus:  DM type: Insulin-requiring type 2.   Dx'ed: 4098.  Complications: CAD and polyneuropathy.   Therapy: insulin since soon after dx.  DKA: never.  Severe hypoglycemia: one possible episode, in early 2018.   Pancreatitis: never.   Other: he takes QD insulin, due to memory loss; wife draws up insulin, but pt gives to himself; wife checks cbg's; he now lives at [facility] Interval history: He takes levemir, 15 units qam, and prn novolog.  Facility sends a record of his cbg's which I have reviewed today.  It varies from 117-275.  There is no trend throughout the day. Past Medical History:  Diagnosis Date  . Coronary artery disease    Moderate LAD, Diagonal, Circumflex disease by cath 2004  . Dementia   . Diabetes mellitus without complication (HCC)   . Fall at home 07/06/2016  . Hyperlipidemia   . Hypertension     Past Surgical History:  Procedure Laterality Date  . CATARACT EXTRACTION, BILATERAL      Social History   Socioeconomic History  . Marital status: Married    Spouse name: Not on file  . Number of children: 4  . Years of education: IT trainer  . Highest education level: Not on file  Social Needs  . Financial resource strain: Not on file  . Food insecurity - worry: Not on file  . Food insecurity - inability: Not on file  . Transportation needs - medical: Not on file  . Transportation needs - non-medical: Not on file  Occupational History  . Occupation: Airline pilot    Comment: Retired  Tobacco Use  . Smoking status: Former Smoker    Years: 3.00    Last attempt to quit: 04/13/1965    Years since quitting: 52.0  . Smokeless tobacco: Never Used  Substance and Sexual Activity  . Alcohol use: No    Alcohol/week: 0.0 oz  . Drug use: No  . Sexual activity: No  Other Topics Concern  . Not on file  Social History Narrative   Lives  with wife in a one story home.   Retired IT trainer.   Right-handed   Caffeine: 1 cup per day    Current Outpatient Medications on File Prior to Visit  Medication Sig Dispense Refill  . acetaminophen (TYLENOL) 325 MG tablet Take 2 tablets (650 mg total) by mouth every 6 (six) hours as needed for mild pain (or Fever >/= 101).    Marland Kitchen aspirin (SM ASPIRIN ADULT LOW STRENGTH) 81 MG EC tablet Take 1 tablet (81 mg total) by mouth daily. Swallow whole. 90 tablet 2  . atorvastatin (LIPITOR) 80 MG tablet TAKE 1 TABLET BY MOUTH ONCE DAILY (Patient taking differently: TAKE 80 mg TABLET BY MOUTH ONCE DAILY) 30 tablet 8  . bisoprolol (ZEBETA) 5 MG tablet Take 1 tablet (5 mg total) by mouth 2 (two) times daily. (Patient taking differently: Take 5 mg by mouth daily. ) 60 tablet 3  . clopidogrel (PLAVIX) 75 MG tablet TAKE 1 TABLET BY MOUTH ONCE DAILY WITH BREAKFAST (Patient taking differently: TAKE 75 mg TABLET BY MOUTH ONCE DAILY WITH BREAKFAST) 90 tablet 3  . cyclobenzaprine (FLEXERIL) 5 MG tablet Take 1 tablet (5 mg total) by mouth 2 (two) times daily as needed for muscle spasms. 16 tablet 0  . donepezil (ARICEPT) 5 MG tablet TAKE  1 TABLET BY MOUTH EVERY NIGHT AT BEDTIME (Patient taking differently: TAKE 5 mg TABLET BY MOUTH EVERY NIGHT AT BEDTIME) 30 tablet 3  . losartan (COZAAR) 50 MG tablet Take 1 tablet (50 mg total) by mouth daily. 90 tablet 0  . ondansetron (ZOFRAN) 4 MG tablet Take 1 tablet (4 mg total) by mouth every 8 (eight) hours as needed for nausea or vomiting. 20 tablet 0  . pantoprazole (PROTONIX) 40 MG tablet Take 1 tablet (40 mg total) by mouth daily. 90 tablet 0  . traMADol (ULTRAM) 50 MG tablet Take 1 tablet (50 mg total) by mouth 2 (two) times daily as needed for severe pain. 20 tablet 0   No current facility-administered medications on file prior to visit.     Allergies  Allergen Reactions  . Hydrocodone-Acetaminophen Other (See Comments)     Delusions    Family History  Problem Relation  Age of Onset  . Other Father        Deceased  . Other Mother        Deceased, 5879  . Other Sister        Deceased  . Cancer Sister        Deceased  . Healthy Son        Living  . Heart disease Son        Deceased, 30 from heart valve dysfunction  . Healthy Daughter     BP 136/84 (BP Location: Left Arm, Patient Position: Sitting, Cuff Size: Normal)   Pulse 71   Resp 20   SpO2 98%    Review of Systems No hypoglycemia    Objective:   Physical Exam VITAL SIGNS:  See vs page GENERAL: no distress Pulses: dorsalis pedis intact bilat.   MSK: no deformity of the feet CV: trace bilat leg edema Skin:  no ulcer on the feet.  normal color and temp on the feet.  Neuro: sensation is intact to touch on the feet, but decreased from normal.     Lab Results  Component Value Date   HGBA1C 8.5 04/29/2017      Assessment & Plan:  Insulin-requiring type 2 DM: he needs sligtly increased rx.    Patient Instructions  check your blood sugar twice a day.  vary the time of day when you check, between before the 3 meals, and at bedtime.  also check if you have symptoms of your blood sugar being too high or too low.  please keep a record of the readings and bring it to your next appointment here (or you can bring the meter itself).  You can write it on any piece of paper.  please call us sooner if your blood sugar goes below 70, or if you have a lot of readings over 200.  Please increase the lantus to 28 units each morning.   Please give novolog QAC, just 3 units, and only if cbg is over 300.   Please come back for a follow-up appointment in 2 months.

## 2017-04-30 DIAGNOSIS — R2681 Unsteadiness on feet: Secondary | ICD-10-CM | POA: Diagnosis not present

## 2017-04-30 DIAGNOSIS — M62838 Other muscle spasm: Secondary | ICD-10-CM | POA: Diagnosis not present

## 2017-04-30 DIAGNOSIS — M545 Low back pain: Secondary | ICD-10-CM | POA: Diagnosis not present

## 2017-04-30 DIAGNOSIS — M6281 Muscle weakness (generalized): Secondary | ICD-10-CM | POA: Diagnosis not present

## 2017-04-30 DIAGNOSIS — R296 Repeated falls: Secondary | ICD-10-CM | POA: Diagnosis not present

## 2017-05-04 ENCOUNTER — Telehealth: Payer: Self-pay | Admitting: Endocrinology

## 2017-05-04 DIAGNOSIS — F419 Anxiety disorder, unspecified: Secondary | ICD-10-CM | POA: Diagnosis not present

## 2017-05-04 NOTE — Telephone Encounter (Signed)
[facility]irre from [facility] ph# [phone] called re: she needs clarification on instructions for Novalog & Lantis. Please call William Aguirre at above ph# to advise

## 2017-05-05 ENCOUNTER — Other Ambulatory Visit: Payer: Self-pay

## 2017-05-05 NOTE — Telephone Encounter (Signed)
I called & spoke with [facility]. I have faxed over his updated prescription list to them for his novolog & levemir.

## 2017-05-05 NOTE — Telephone Encounter (Signed)
[facility] is calling in regards to the script list that was sent over, They stated they had to have orders for him, not the list.   Please advise  [phone]

## 2017-05-05 NOTE — Telephone Encounter (Signed)
I called [facility] back & they stated they would get someone to return my call.

## 2017-05-06 DIAGNOSIS — F0150 Vascular dementia without behavioral disturbance: Secondary | ICD-10-CM | POA: Diagnosis not present

## 2017-05-06 NOTE — Telephone Encounter (Signed)
Shawna calling from [facility] stated patient Lantus instructions are not clear please advise [phone]

## 2017-05-07 ENCOUNTER — Other Ambulatory Visit: Payer: Self-pay

## 2017-05-07 DIAGNOSIS — R296 Repeated falls: Secondary | ICD-10-CM | POA: Diagnosis not present

## 2017-05-07 DIAGNOSIS — M545 Low back pain: Secondary | ICD-10-CM | POA: Diagnosis not present

## 2017-05-07 DIAGNOSIS — M6281 Muscle weakness (generalized): Secondary | ICD-10-CM | POA: Diagnosis not present

## 2017-05-07 DIAGNOSIS — M62838 Other muscle spasm: Secondary | ICD-10-CM | POA: Diagnosis not present

## 2017-05-07 DIAGNOSIS — R2681 Unsteadiness on feet: Secondary | ICD-10-CM | POA: Diagnosis not present

## 2017-05-07 NOTE — Telephone Encounter (Signed)
I have called back Shauna to clarify. She stated that she is sending us a fax so we can fill it out, then she can have it in writing the correct dosages.

## 2017-05-11 DIAGNOSIS — F0150 Vascular dementia without behavioral disturbance: Secondary | ICD-10-CM | POA: Diagnosis not present

## 2017-05-11 DIAGNOSIS — I2510 Atherosclerotic heart disease of native coronary artery without angina pectoris: Secondary | ICD-10-CM | POA: Diagnosis not present

## 2017-05-11 DIAGNOSIS — K219 Gastro-esophageal reflux disease without esophagitis: Secondary | ICD-10-CM | POA: Diagnosis not present

## 2017-05-11 DIAGNOSIS — I10 Essential (primary) hypertension: Secondary | ICD-10-CM | POA: Diagnosis not present

## 2017-05-11 DIAGNOSIS — G8929 Other chronic pain: Secondary | ICD-10-CM | POA: Diagnosis not present

## 2017-05-11 DIAGNOSIS — E785 Hyperlipidemia, unspecified: Secondary | ICD-10-CM | POA: Diagnosis not present

## 2017-05-11 DIAGNOSIS — Z8673 Personal history of transient ischemic attack (TIA), and cerebral infarction without residual deficits: Secondary | ICD-10-CM | POA: Diagnosis not present

## 2017-05-11 DIAGNOSIS — E119 Type 2 diabetes mellitus without complications: Secondary | ICD-10-CM | POA: Diagnosis not present

## 2017-05-11 DIAGNOSIS — F419 Anxiety disorder, unspecified: Secondary | ICD-10-CM | POA: Diagnosis not present

## 2017-05-13 DIAGNOSIS — M4712 Other spondylosis with myelopathy, cervical region: Secondary | ICD-10-CM | POA: Diagnosis not present

## 2017-05-13 DIAGNOSIS — M545 Low back pain: Secondary | ICD-10-CM | POA: Diagnosis not present

## 2017-05-13 DIAGNOSIS — I10 Essential (primary) hypertension: Secondary | ICD-10-CM | POA: Diagnosis not present

## 2017-05-18 DIAGNOSIS — F419 Anxiety disorder, unspecified: Secondary | ICD-10-CM | POA: Diagnosis not present

## 2017-05-25 DIAGNOSIS — F419 Anxiety disorder, unspecified: Secondary | ICD-10-CM | POA: Diagnosis not present

## 2017-06-01 DIAGNOSIS — F419 Anxiety disorder, unspecified: Secondary | ICD-10-CM | POA: Diagnosis not present

## 2017-06-04 DIAGNOSIS — M62838 Other muscle spasm: Secondary | ICD-10-CM | POA: Diagnosis not present

## 2017-06-04 DIAGNOSIS — M6281 Muscle weakness (generalized): Secondary | ICD-10-CM | POA: Diagnosis not present

## 2017-06-04 DIAGNOSIS — M545 Low back pain: Secondary | ICD-10-CM | POA: Diagnosis not present

## 2017-06-04 DIAGNOSIS — R296 Repeated falls: Secondary | ICD-10-CM | POA: Diagnosis not present

## 2017-06-08 DIAGNOSIS — F419 Anxiety disorder, unspecified: Secondary | ICD-10-CM | POA: Diagnosis not present

## 2017-06-11 DIAGNOSIS — E119 Type 2 diabetes mellitus without complications: Secondary | ICD-10-CM | POA: Diagnosis not present

## 2017-06-11 DIAGNOSIS — E785 Hyperlipidemia, unspecified: Secondary | ICD-10-CM | POA: Diagnosis not present

## 2017-06-11 DIAGNOSIS — R2681 Unsteadiness on feet: Secondary | ICD-10-CM | POA: Diagnosis not present

## 2017-06-11 DIAGNOSIS — M6281 Muscle weakness (generalized): Secondary | ICD-10-CM | POA: Diagnosis not present

## 2017-06-11 DIAGNOSIS — M545 Low back pain: Secondary | ICD-10-CM | POA: Diagnosis not present

## 2017-06-11 DIAGNOSIS — G8929 Other chronic pain: Secondary | ICD-10-CM | POA: Diagnosis not present

## 2017-06-11 DIAGNOSIS — M62838 Other muscle spasm: Secondary | ICD-10-CM | POA: Diagnosis not present

## 2017-06-11 DIAGNOSIS — R296 Repeated falls: Secondary | ICD-10-CM | POA: Diagnosis not present

## 2017-06-15 DIAGNOSIS — F419 Anxiety disorder, unspecified: Secondary | ICD-10-CM | POA: Diagnosis not present

## 2017-06-22 DIAGNOSIS — F419 Anxiety disorder, unspecified: Secondary | ICD-10-CM | POA: Diagnosis not present

## 2017-06-24 DIAGNOSIS — M6281 Muscle weakness (generalized): Secondary | ICD-10-CM | POA: Diagnosis not present

## 2017-06-24 DIAGNOSIS — R278 Other lack of coordination: Secondary | ICD-10-CM | POA: Diagnosis not present

## 2017-06-28 ENCOUNTER — Ambulatory Visit (INDEPENDENT_AMBULATORY_CARE_PROVIDER_SITE_OTHER): Payer: Medicare HMO | Admitting: Endocrinology

## 2017-06-28 ENCOUNTER — Encounter: Payer: Self-pay | Admitting: Endocrinology

## 2017-06-28 VITALS — BP 132/84 | HR 70 | Ht 69.0 in | Wt 201.0 lb

## 2017-06-28 DIAGNOSIS — E1142 Type 2 diabetes mellitus with diabetic polyneuropathy: Secondary | ICD-10-CM | POA: Diagnosis not present

## 2017-06-28 LAB — HEMOGLOBIN A1C: Hgb A1c MFr Bld: 9.1 % — ABNORMAL HIGH (ref 4.6–6.5)

## 2017-06-28 MED ORDER — INSULIN DETEMIR 100 UNIT/ML FLEXPEN: 32.0000 [IU] | PEN_INJECTOR | SUBCUTANEOUS | 11 refills | Status: DC

## 2017-06-28 MED ORDER — INSULIN DETEMIR 100 UNIT/ML FLEXPEN: 28.0000 [IU] | PEN_INJECTOR | SUBCUTANEOUS | 11 refills | Status: DC

## 2017-06-28 NOTE — Patient Instructions (Addendum)
check your blood sugar twice a day.  vary the time of day when you check, between before the 3 meals, and at bedtime.  also check if you have symptoms of your blood sugar being too high or too low.  please keep a record of the readings and bring it to your next appointment here (or you can bring the meter itself).  You can write it on any piece of paper.  please call us sooner if your blood sugar goes below 70, or if you have a lot of readings over 200. blood tests are requested for you today.  We'll let you know about the results.   Please come back for a follow-up appointment in 2 months.   

## 2017-06-28 NOTE — Progress Notes (Signed)
Subjective:    Patient ID: William Aguirre, male    DOB: [birth year], 83 y.o.   MRN: [id number]  HPI Pt returns for f/u of diabetes mellitus:  DM type: Insulin-requiring type 2.   Dx'ed: 7829: 1986.  Complications: CAD, renal insuff, and polyneuropathy.   Therapy: insulin since soon after dx.  DKA: never.  Severe hypoglycemia: one possible episode, in early 2018.   Pancreatitis: never.   Other: he takes QD insulin, due to memory loss; he [facility] lives [facility]. Interval history: He takes levemir, 15 units qam, and prn novolog.  Facility sends a record of his cbg's which I have reviewed today.  It varies from 122-324.  It is in general higher as the day goes on.  Past Medical History:  Diagnosis Date  . Coronary artery disease    Moderate LAD, Diagonal, Circumflex disease by cath 2004  . Dementia   . Diabetes mellitus without complication (HCC)   . Fall at home 07/06/2016  . Hyperlipidemia   . Hypertension     Past Surgical History:  Procedure Laterality Date  . CATARACT EXTRACTION, BILATERAL      Social History   Socioeconomic History  . Marital status: Married    Spouse name: Not on file  . Number of children: 4  . Years of education: IT trainerCPA  . Highest education level: Not on file  Social Needs  . Financial resource strain: Not on file  . Food insecurity - worry: Not on file  . Food insecurity - inability: Not on file  . Transportation needs - medical: Not on file  . Transportation needs - non-medical: Not on file  Occupational History  . Occupation: Airline pilotAccountant    Comment: Retired  Tobacco Use  . Smoking status: Former Smoker    Years: 3.00    Last attempt to quit: 04/13/1965    Years since quitting: 52.2  . Smokeless tobacco: Never Used  Substance and Sexual Activity  . Alcohol use: No    Alcohol/week: 0.0 oz  . Drug use: No  . Sexual activity: No  Other Topics Concern  . Not on file  Social History Narrative   Lives with wife in a one story home.   Retired  IT trainerCPA.   Right-handed   Caffeine: 1 cup per day    Current Outpatient Medications on File Prior to Visit  Medication Sig Dispense Refill  . acetaminophen (TYLENOL) 325 MG tablet Take 2 tablets (650 mg total) by mouth every 6 (six) hours as needed for mild pain (or Fever >/= 101).    Marland Kitchen. aspirin (SM ASPIRIN ADULT LOW STRENGTH) 81 MG EC tablet Take 1 tablet (81 mg total) by mouth daily. Swallow whole. 90 tablet 2  . atorvastatin (LIPITOR) 80 MG tablet TAKE 1 TABLET BY MOUTH ONCE DAILY (Patient taking differently: TAKE 80 mg TABLET BY MOUTH ONCE DAILY) 30 tablet 8  . bisoprolol (ZEBETA) 5 MG tablet Take 1 tablet (5 mg total) by mouth 2 (two) times daily. (Patient taking differently: Take 5 mg by mouth daily. ) 60 tablet 3  . clopidogrel (PLAVIX) 75 MG tablet TAKE 1 TABLET BY MOUTH ONCE DAILY WITH BREAKFAST (Patient taking differently: TAKE 75 mg TABLET BY MOUTH ONCE DAILY WITH BREAKFAST) 90 tablet 3  . cyclobenzaprine (FLEXERIL) 5 MG tablet Take 1 tablet (5 mg total) by mouth 2 (two) times daily as needed for muscle spasms. 16 tablet 0  . donepezil (ARICEPT) 5 MG tablet TAKE 1 TABLET BY MOUTH EVERY NIGHT  AT BEDTIME (Patient taking differently: TAKE 5 mg TABLET BY MOUTH EVERY NIGHT AT BEDTIME) 30 tablet 3  . insulin aspart (NOVOLOG) 100 UNIT/ML injection Inject 3 Units into the skin 3 (three) times daily with meals. Only if cbg over 300 10 mL 11  . losartan (COZAAR) 50 MG tablet Take 1 tablet (50 mg total) by mouth daily. 90 tablet 0  . ondansetron (ZOFRAN) 4 MG tablet Take 1 tablet (4 mg total) by mouth every 8 (eight) hours as needed for nausea or vomiting. 20 tablet 0  . pantoprazole (PROTONIX) 40 MG tablet Take 1 tablet (40 mg total) by mouth daily. 90 tablet 0  . traMADol (ULTRAM) 50 MG tablet Take 1 tablet (50 mg total) by mouth 2 (two) times daily as needed for severe pain. 20 tablet 0   No current facility-administered medications on file prior to visit.     Allergies  Allergen Reactions  .  Hydrocodone-Acetaminophen Other (See Comments)     Delusions    Family History  Problem Relation Age of Onset  . Other Father        Deceased  . Other Mother        Deceased, 50  . Other Sister        Deceased  . Cancer Sister        Deceased  . Healthy Son        Living  . Heart disease Son        Deceased, 30 from heart valve dysfunction  . Healthy Daughter     BP 132/84   Pulse 70   Ht 5\' 9"  (1.753 m)   Wt 201 lb (91.2 kg)   SpO2 (!) 70%   BMI 29.68 kg/m   Review of Systems He denies hypoglycemia    Objective:   Physical Exam VITAL SIGNS:  See vs page GENERAL: no distress Pulses: dorsalis pedis intact bilat.   MSK: no deformity of the feet CV: trace bilat leg edema Skin:  no ulcer on the feet.  normal color and temp on the feet.  Neuro: sensation is intact to touch on the feet, but decreased from normal.    Lab Results  Component Value Date   HGBA1C 9.1 (H) 06/28/2017   Lab Results  Component Value Date   CREATININE 1.09 04/20/2017   BUN 11 04/20/2017   NA 137 04/20/2017   K 3.5 04/20/2017   CL 105 04/20/2017   CO2 24 04/20/2017      Assessment & Plan:  Insulin-requiring type 2 DM: worse.  Increase levemir to 32 units qam CAD: in this context, he should avoid hypoglycemia, so we'll have to titrate levemir slowly

## 2017-06-29 DIAGNOSIS — F419 Anxiety disorder, unspecified: Secondary | ICD-10-CM | POA: Diagnosis not present

## 2017-07-01 ENCOUNTER — Other Ambulatory Visit: Payer: Self-pay

## 2017-07-01 DIAGNOSIS — F0150 Vascular dementia without behavioral disturbance: Secondary | ICD-10-CM | POA: Diagnosis not present

## 2017-07-01 DIAGNOSIS — F5105 Insomnia due to other mental disorder: Secondary | ICD-10-CM | POA: Diagnosis not present

## 2017-07-01 DIAGNOSIS — E119 Type 2 diabetes mellitus without complications: Secondary | ICD-10-CM | POA: Diagnosis not present

## 2017-07-01 MED ORDER — INSULIN DETEMIR 100 UNIT/ML FLEXPEN: 32.0000 [IU] | PEN_INJECTOR | SUBCUTANEOUS | 11 refills | Status: DC

## 2017-07-02 ENCOUNTER — Telehealth: Payer: Self-pay | Admitting: Endocrinology

## 2017-07-02 NOTE — Telephone Encounter (Signed)
I have sent note signed by Dr[facility] Everardo [facility]Ellison to [facility] via fax.

## 2017-07-02 NOTE — Telephone Encounter (Signed)
OK 

## 2017-07-02 NOTE — Telephone Encounter (Signed)
I sent [facility] patient's new Levemir prescription yesterday for the increase. Now they are wanting a letter or note to d/c the old dose of 28 units. Ok to type a note to have you sign?

## 2017-07-02 NOTE — Telephone Encounter (Signed)
Naomi with [facility] [phone]#[phone] needs the DC Order for prior [facility] Order

## 2017-07-06 DIAGNOSIS — E119 Type 2 diabetes mellitus without complications: Secondary | ICD-10-CM | POA: Diagnosis not present

## 2017-07-06 DIAGNOSIS — I10 Essential (primary) hypertension: Secondary | ICD-10-CM | POA: Diagnosis not present

## 2017-07-06 DIAGNOSIS — G8929 Other chronic pain: Secondary | ICD-10-CM | POA: Diagnosis not present

## 2017-07-06 DIAGNOSIS — R2681 Unsteadiness on feet: Secondary | ICD-10-CM | POA: Diagnosis not present

## 2017-07-06 DIAGNOSIS — E785 Hyperlipidemia, unspecified: Secondary | ICD-10-CM | POA: Diagnosis not present

## 2017-07-06 DIAGNOSIS — F419 Anxiety disorder, unspecified: Secondary | ICD-10-CM | POA: Diagnosis not present

## 2017-07-06 DIAGNOSIS — K219 Gastro-esophageal reflux disease without esophagitis: Secondary | ICD-10-CM | POA: Diagnosis not present

## 2017-07-06 DIAGNOSIS — M6281 Muscle weakness (generalized): Secondary | ICD-10-CM | POA: Diagnosis not present

## 2017-07-06 DIAGNOSIS — F0150 Vascular dementia without behavioral disturbance: Secondary | ICD-10-CM | POA: Diagnosis not present

## 2017-07-06 DIAGNOSIS — I2510 Atherosclerotic heart disease of native coronary artery without angina pectoris: Secondary | ICD-10-CM | POA: Diagnosis not present

## 2017-07-23 DIAGNOSIS — K219 Gastro-esophageal reflux disease without esophagitis: Secondary | ICD-10-CM | POA: Diagnosis not present

## 2017-07-23 DIAGNOSIS — R296 Repeated falls: Secondary | ICD-10-CM | POA: Diagnosis not present

## 2017-07-23 DIAGNOSIS — E119 Type 2 diabetes mellitus without complications: Secondary | ICD-10-CM | POA: Diagnosis not present

## 2017-07-23 DIAGNOSIS — I2510 Atherosclerotic heart disease of native coronary artery without angina pectoris: Secondary | ICD-10-CM | POA: Diagnosis not present

## 2017-07-23 DIAGNOSIS — I10 Essential (primary) hypertension: Secondary | ICD-10-CM | POA: Diagnosis not present

## 2017-07-23 DIAGNOSIS — M62838 Other muscle spasm: Secondary | ICD-10-CM | POA: Diagnosis not present

## 2017-07-23 DIAGNOSIS — E785 Hyperlipidemia, unspecified: Secondary | ICD-10-CM | POA: Diagnosis not present

## 2017-07-23 DIAGNOSIS — G8929 Other chronic pain: Secondary | ICD-10-CM | POA: Diagnosis not present

## 2017-07-23 DIAGNOSIS — M545 Low back pain: Secondary | ICD-10-CM | POA: Diagnosis not present

## 2017-07-25 DIAGNOSIS — R278 Other lack of coordination: Secondary | ICD-10-CM | POA: Diagnosis not present

## 2017-07-25 DIAGNOSIS — M6281 Muscle weakness (generalized): Secondary | ICD-10-CM | POA: Diagnosis not present

## 2017-07-28 DIAGNOSIS — E119 Type 2 diabetes mellitus without complications: Secondary | ICD-10-CM | POA: Diagnosis not present

## 2017-08-04 DIAGNOSIS — E785 Hyperlipidemia, unspecified: Secondary | ICD-10-CM | POA: Diagnosis not present

## 2017-08-04 DIAGNOSIS — F0150 Vascular dementia without behavioral disturbance: Secondary | ICD-10-CM | POA: Diagnosis not present

## 2017-08-04 DIAGNOSIS — I10 Essential (primary) hypertension: Secondary | ICD-10-CM | POA: Diagnosis not present

## 2017-08-04 DIAGNOSIS — E119 Type 2 diabetes mellitus without complications: Secondary | ICD-10-CM | POA: Diagnosis not present

## 2017-08-04 DIAGNOSIS — Z8673 Personal history of transient ischemic attack (TIA), and cerebral infarction without residual deficits: Secondary | ICD-10-CM | POA: Diagnosis not present

## 2017-08-04 DIAGNOSIS — I2510 Atherosclerotic heart disease of native coronary artery without angina pectoris: Secondary | ICD-10-CM | POA: Diagnosis not present

## 2017-08-04 DIAGNOSIS — F5105 Insomnia due to other mental disorder: Secondary | ICD-10-CM | POA: Diagnosis not present

## 2017-08-10 DIAGNOSIS — F419 Anxiety disorder, unspecified: Secondary | ICD-10-CM | POA: Diagnosis not present

## 2017-08-12 DIAGNOSIS — E119 Type 2 diabetes mellitus without complications: Secondary | ICD-10-CM | POA: Diagnosis not present

## 2017-08-20 DIAGNOSIS — E119 Type 2 diabetes mellitus without complications: Secondary | ICD-10-CM | POA: Diagnosis not present

## 2017-08-20 DIAGNOSIS — G8929 Other chronic pain: Secondary | ICD-10-CM | POA: Diagnosis not present

## 2017-08-20 DIAGNOSIS — M62838 Other muscle spasm: Secondary | ICD-10-CM | POA: Diagnosis not present

## 2017-08-20 DIAGNOSIS — I2510 Atherosclerotic heart disease of native coronary artery without angina pectoris: Secondary | ICD-10-CM | POA: Diagnosis not present

## 2017-08-20 DIAGNOSIS — I10 Essential (primary) hypertension: Secondary | ICD-10-CM | POA: Diagnosis not present

## 2017-08-20 DIAGNOSIS — Z8673 Personal history of transient ischemic attack (TIA), and cerebral infarction without residual deficits: Secondary | ICD-10-CM | POA: Diagnosis not present

## 2017-08-20 DIAGNOSIS — K219 Gastro-esophageal reflux disease without esophagitis: Secondary | ICD-10-CM | POA: Diagnosis not present

## 2017-08-20 DIAGNOSIS — M545 Low back pain: Secondary | ICD-10-CM | POA: Diagnosis not present

## 2017-08-20 DIAGNOSIS — E785 Hyperlipidemia, unspecified: Secondary | ICD-10-CM | POA: Diagnosis not present

## 2017-08-24 DIAGNOSIS — M6281 Muscle weakness (generalized): Secondary | ICD-10-CM | POA: Diagnosis not present

## 2017-08-24 DIAGNOSIS — F419 Anxiety disorder, unspecified: Secondary | ICD-10-CM | POA: Diagnosis not present

## 2017-08-24 DIAGNOSIS — R278 Other lack of coordination: Secondary | ICD-10-CM | POA: Diagnosis not present

## 2017-08-27 ENCOUNTER — Ambulatory Visit (INDEPENDENT_AMBULATORY_CARE_PROVIDER_SITE_OTHER): Payer: Medicare HMO | Admitting: Endocrinology

## 2017-08-27 ENCOUNTER — Encounter: Payer: Self-pay | Admitting: Endocrinology

## 2017-08-27 VITALS — BP 130/82 | HR 66 | Wt 199.8 lb

## 2017-08-27 DIAGNOSIS — M545 Low back pain: Secondary | ICD-10-CM | POA: Diagnosis not present

## 2017-08-27 DIAGNOSIS — F5105 Insomnia due to other mental disorder: Secondary | ICD-10-CM | POA: Diagnosis not present

## 2017-08-27 DIAGNOSIS — M6281 Muscle weakness (generalized): Secondary | ICD-10-CM | POA: Diagnosis not present

## 2017-08-27 DIAGNOSIS — E1142 Type 2 diabetes mellitus with diabetic polyneuropathy: Secondary | ICD-10-CM | POA: Diagnosis not present

## 2017-08-27 DIAGNOSIS — F0150 Vascular dementia without behavioral disturbance: Secondary | ICD-10-CM | POA: Diagnosis not present

## 2017-08-27 LAB — POCT GLYCOSYLATED HEMOGLOBIN (HGB A1C): Hemoglobin A1C: 8.7

## 2017-08-27 NOTE — Patient Instructions (Addendum)
check your blood sugar twice a day.  vary the time of day when you check, between before the 3 meals, and at bedtime.  also check if you have symptoms of your blood sugar being too high or too low.  please keep a record of the readings and bring it to your next appointment here (or you can bring the meter itself).  You can write it on any piece of paper.  please call us sooner if your blood sugar goes below 70, or if you have a lot of readings over 200.  Please increase the levemir to 35 units each morning, and: Please continue the same as-needed novolog.   Please come back for a follow-up appointment in 2 months.

## 2017-08-27 NOTE — Progress Notes (Signed)
Subjective:    Patient ID: William Aguirre, male    DOB: [birth year], 83 y.o.   MRN: [id number]  HPI Pt returns for f/u of diabetes mellitus:  DM type: Insulin-requiring type 2.   Dx'ed: 4098.  Complications: CAD, renal insuff, and polyneuropathy.   Therapy: insulin since soon after dx.  DKA: never.  Severe hypoglycemia: one possible episode, in early 2018.   Pancreatitis: never.   Other: he takes QD insulin, due to memory loss; he now lives at [facility]. Interval history: He takes levemir, 32 units qam, and prn novolog.  Facility sends a record of his cbg's which I have reviewed today.  It varies from 131-321.  It is in general higher as the day goes on, but not necessarily so.  Past Medical History:  Diagnosis Date  . Coronary artery disease    Moderate LAD, Diagonal, Circumflex disease by cath 2004  . Dementia   . Diabetes mellitus without complication (HCC)   . Fall at home 07/06/2016  . Hyperlipidemia   . Hypertension     Past Surgical History:  Procedure Laterality Date  . CATARACT EXTRACTION, BILATERAL      Social History   Socioeconomic History  . Marital status: Married    Spouse name: Not on file  . Number of children: 4  . Years of education: IT trainer  . Highest education level: Not on file  Occupational History  . Occupation: Airline pilot    Comment: Retired  Engineer, production  . Financial resource strain: Not on file  . Food insecurity:    Worry: Not on file    Inability: Not on file  . Transportation needs:    Medical: Not on file    Non-medical: Not on file  Tobacco Use  . Smoking status: Former Smoker    Years: 3.00    Last attempt to quit: 04/13/1965    Years since quitting: 52.4  . Smokeless tobacco: Never Used  Substance and Sexual Activity  . Alcohol use: No    Alcohol/week: 0.0 oz  . Drug use: No  . Sexual activity: Never  Lifestyle  . Physical activity:    Days per week: Not on file    Minutes per session: Not on file  . Stress: Not on file   Relationships  . Social connections:    Talks on phone: Not on file    Gets together: Not on file    Attends religious service: Not on file    Active member of club or organization: Not on file    Attends meetings of clubs or organizations: Not on file    Relationship status: Not on file  . Intimate partner violence:    Fear of current or ex partner: Not on file    Emotionally abused: Not on file    Physically abused: Not on file    Forced sexual activity: Not on file  Other Topics Concern  . Not on file  Social History Narrative   Lives with wife in a one story home.   Retired IT trainer.   Right-handed   Caffeine: 1 cup per day    Current Outpatient Medications on File Prior to Visit  Medication Sig Dispense Refill  . acetaminophen (TYLENOL) 325 MG tablet Take 2 tablets (650 mg total) by mouth every 6 (six) hours as needed for mild pain (or Fever >/= 101).    Marland Kitchen aspirin (SM ASPIRIN ADULT LOW STRENGTH) 81 MG EC tablet Take 1 tablet (81 mg total) by  mouth daily. Swallow whole. 90 tablet 2  . atorvastatin (LIPITOR) 80 MG tablet TAKE 1 TABLET BY MOUTH ONCE DAILY (Patient taking differently: TAKE 80 mg TABLET BY MOUTH ONCE DAILY) 30 tablet 8  . bisoprolol (ZEBETA) 5 MG tablet Take 1 tablet (5 mg total) by mouth 2 (two) times daily. (Patient taking differently: Take 5 mg by mouth daily. ) 60 tablet 3  . clopidogrel (PLAVIX) 75 MG tablet TAKE 1 TABLET BY MOUTH ONCE DAILY WITH BREAKFAST (Patient taking differently: TAKE 75 mg TABLET BY MOUTH ONCE DAILY WITH BREAKFAST) 90 tablet 3  . cyclobenzaprine (FLEXERIL) 5 MG tablet Take 1 tablet (5 mg total) by mouth 2 (two) times daily as needed for muscle spasms. 16 tablet 0  . donepezil (ARICEPT) 5 MG tablet TAKE 1 TABLET BY MOUTH EVERY NIGHT AT BEDTIME (Patient taking differently: TAKE 5 mg TABLET BY MOUTH EVERY NIGHT AT BEDTIME) 30 tablet 3  . insulin aspart (NOVOLOG) 100 UNIT/ML injection Inject 3 Units into the skin 3 (three) times daily with meals.  Only if cbg over 300 10 mL 11  . Insulin Detemir (LEVEMIR FLEXTOUCH) 100 UNIT/ML Pen Inject 32 Units into the skin every morning. 20 mL 11  . losartan (COZAAR) 50 MG tablet Take 1 tablet (50 mg total) by mouth daily. 90 tablet 0  . ondansetron (ZOFRAN) 4 MG tablet Take 1 tablet (4 mg total) by mouth every 8 (eight) hours as needed for nausea or vomiting. 20 tablet 0  . pantoprazole (PROTONIX) 40 MG tablet Take 1 tablet (40 mg total) by mouth daily. 90 tablet 0  . traMADol (ULTRAM) 50 MG tablet Take 1 tablet (50 mg total) by mouth 2 (two) times daily as needed for severe pain. 20 tablet 0   No current facility-administered medications on file prior to visit.     Allergies  Allergen Reactions  . Hydrocodone-Acetaminophen Other (See Comments)     Delusions    Family History  Problem Relation Age of Onset  . Other Father        Deceased  . Other Mother        Deceased, 71  . Other Sister        Deceased  . Cancer Sister        Deceased  . Healthy Son        Living  . Heart disease Son        Deceased, 30 from heart valve dysfunction  . Healthy Daughter     BP 130/82   Pulse 66   Wt 199 lb 12.8 oz (90.6 kg)   SpO2 99%   BMI 29.51 kg/m    Review of Systems He denies hypoglycemia    Objective:   Physical Exam VITAL SIGNS:  See vs page GENERAL: no distress Pulses: dorsalis pedis intact bilat.   MSK: no deformity of the feet CV: no leg edema Skin:  no ulcer on the feet.  normal color and temp on the feet.  Neuro: sensation is intact to touch on the feet, but decreased from normal.   A1c=8.7%    Assessment & Plan:  Insulin-requiring type 2 DM: worse.  Increase levemir to 32 units qam CAD: in this context, he should avoid hypoglycemia, so we'll have to titrate levemir slowly  Patient Instructions  check your blood sugar twice a day.  vary the time of day when you check, between before the 3 meals, and at bedtime.  also check if you have symptoms of your blood sugar  being too high or too low.  please keep a record of the readings and bring it to your next appointment here (or you can bring the meter itself).  You can write it on any piece of paper.  please call us sooner if your blood sugar goes below 70, or if you have a lot of readings over 200.  Please increase the levemir to 35 units each morning, and: Please continue the same as-needed novolog.   Please come back for a follow-up appointment in 2 months.

## 2017-09-03 DIAGNOSIS — Z9181 History of falling: Secondary | ICD-10-CM | POA: Diagnosis not present

## 2017-09-03 DIAGNOSIS — E119 Type 2 diabetes mellitus without complications: Secondary | ICD-10-CM | POA: Diagnosis not present

## 2017-09-03 DIAGNOSIS — I10 Essential (primary) hypertension: Secondary | ICD-10-CM | POA: Diagnosis not present

## 2017-09-03 DIAGNOSIS — Z794 Long term (current) use of insulin: Secondary | ICD-10-CM | POA: Diagnosis not present

## 2017-09-03 DIAGNOSIS — Z7902 Long term (current) use of antithrombotics/antiplatelets: Secondary | ICD-10-CM | POA: Diagnosis not present

## 2017-09-03 DIAGNOSIS — I2510 Atherosclerotic heart disease of native coronary artery without angina pectoris: Secondary | ICD-10-CM | POA: Diagnosis not present

## 2017-09-03 DIAGNOSIS — M545 Low back pain: Secondary | ICD-10-CM | POA: Diagnosis not present

## 2017-09-03 DIAGNOSIS — R2681 Unsteadiness on feet: Secondary | ICD-10-CM | POA: Diagnosis not present

## 2017-09-07 DIAGNOSIS — E119 Type 2 diabetes mellitus without complications: Secondary | ICD-10-CM | POA: Diagnosis not present

## 2017-09-07 DIAGNOSIS — R2681 Unsteadiness on feet: Secondary | ICD-10-CM | POA: Diagnosis not present

## 2017-09-07 DIAGNOSIS — Z7902 Long term (current) use of antithrombotics/antiplatelets: Secondary | ICD-10-CM | POA: Diagnosis not present

## 2017-09-07 DIAGNOSIS — F419 Anxiety disorder, unspecified: Secondary | ICD-10-CM | POA: Diagnosis not present

## 2017-09-07 DIAGNOSIS — M545 Low back pain: Secondary | ICD-10-CM | POA: Diagnosis not present

## 2017-09-07 DIAGNOSIS — Z794 Long term (current) use of insulin: Secondary | ICD-10-CM | POA: Diagnosis not present

## 2017-09-07 DIAGNOSIS — I2510 Atherosclerotic heart disease of native coronary artery without angina pectoris: Secondary | ICD-10-CM | POA: Diagnosis not present

## 2017-09-07 DIAGNOSIS — Z9181 History of falling: Secondary | ICD-10-CM | POA: Diagnosis not present

## 2017-09-07 DIAGNOSIS — I10 Essential (primary) hypertension: Secondary | ICD-10-CM | POA: Diagnosis not present

## 2017-09-08 DIAGNOSIS — Z7902 Long term (current) use of antithrombotics/antiplatelets: Secondary | ICD-10-CM | POA: Diagnosis not present

## 2017-09-08 DIAGNOSIS — Z9181 History of falling: Secondary | ICD-10-CM | POA: Diagnosis not present

## 2017-09-08 DIAGNOSIS — I10 Essential (primary) hypertension: Secondary | ICD-10-CM | POA: Diagnosis not present

## 2017-09-08 DIAGNOSIS — M545 Low back pain: Secondary | ICD-10-CM | POA: Diagnosis not present

## 2017-09-08 DIAGNOSIS — E119 Type 2 diabetes mellitus without complications: Secondary | ICD-10-CM | POA: Diagnosis not present

## 2017-09-08 DIAGNOSIS — I2510 Atherosclerotic heart disease of native coronary artery without angina pectoris: Secondary | ICD-10-CM | POA: Diagnosis not present

## 2017-09-08 DIAGNOSIS — F0150 Vascular dementia without behavioral disturbance: Secondary | ICD-10-CM | POA: Diagnosis not present

## 2017-09-08 DIAGNOSIS — Z794 Long term (current) use of insulin: Secondary | ICD-10-CM | POA: Diagnosis not present

## 2017-09-08 DIAGNOSIS — R2681 Unsteadiness on feet: Secondary | ICD-10-CM | POA: Diagnosis not present

## 2017-09-10 DIAGNOSIS — M545 Low back pain: Secondary | ICD-10-CM | POA: Diagnosis not present

## 2017-09-10 DIAGNOSIS — E119 Type 2 diabetes mellitus without complications: Secondary | ICD-10-CM | POA: Diagnosis not present

## 2017-09-10 DIAGNOSIS — Z9181 History of falling: Secondary | ICD-10-CM | POA: Diagnosis not present

## 2017-09-10 DIAGNOSIS — I2510 Atherosclerotic heart disease of native coronary artery without angina pectoris: Secondary | ICD-10-CM | POA: Diagnosis not present

## 2017-09-10 DIAGNOSIS — Z794 Long term (current) use of insulin: Secondary | ICD-10-CM | POA: Diagnosis not present

## 2017-09-10 DIAGNOSIS — Z7902 Long term (current) use of antithrombotics/antiplatelets: Secondary | ICD-10-CM | POA: Diagnosis not present

## 2017-09-10 DIAGNOSIS — I10 Essential (primary) hypertension: Secondary | ICD-10-CM | POA: Diagnosis not present

## 2017-09-10 DIAGNOSIS — R2681 Unsteadiness on feet: Secondary | ICD-10-CM | POA: Diagnosis not present

## 2017-09-13 ENCOUNTER — Telehealth: Payer: Self-pay | Admitting: Endocrinology

## 2017-09-13 DIAGNOSIS — I2510 Atherosclerotic heart disease of native coronary artery without angina pectoris: Secondary | ICD-10-CM | POA: Diagnosis not present

## 2017-09-13 DIAGNOSIS — Z7902 Long term (current) use of antithrombotics/antiplatelets: Secondary | ICD-10-CM | POA: Diagnosis not present

## 2017-09-13 DIAGNOSIS — E119 Type 2 diabetes mellitus without complications: Secondary | ICD-10-CM | POA: Diagnosis not present

## 2017-09-13 DIAGNOSIS — R2681 Unsteadiness on feet: Secondary | ICD-10-CM | POA: Diagnosis not present

## 2017-09-13 DIAGNOSIS — Z794 Long term (current) use of insulin: Secondary | ICD-10-CM | POA: Diagnosis not present

## 2017-09-13 DIAGNOSIS — Z9181 History of falling: Secondary | ICD-10-CM | POA: Diagnosis not present

## 2017-09-13 DIAGNOSIS — M545 Low back pain: Secondary | ICD-10-CM | POA: Diagnosis not present

## 2017-09-13 DIAGNOSIS — I10 Essential (primary) hypertension: Secondary | ICD-10-CM | POA: Diagnosis not present

## 2017-09-13 NOTE — Telephone Encounter (Signed)
William Aguirre is calling from [facility] to see if the has been any changes in any of his insulins? Please advise  Phone# [phone] Fax# [phone]

## 2017-09-14 ENCOUNTER — Other Ambulatory Visit: Payer: Self-pay

## 2017-09-14 DIAGNOSIS — I2510 Atherosclerotic heart disease of native coronary artery without angina pectoris: Secondary | ICD-10-CM | POA: Diagnosis not present

## 2017-09-14 DIAGNOSIS — M545 Low back pain: Secondary | ICD-10-CM | POA: Diagnosis not present

## 2017-09-14 DIAGNOSIS — I10 Essential (primary) hypertension: Secondary | ICD-10-CM | POA: Diagnosis not present

## 2017-09-14 DIAGNOSIS — Z794 Long term (current) use of insulin: Secondary | ICD-10-CM | POA: Diagnosis not present

## 2017-09-14 DIAGNOSIS — E119 Type 2 diabetes mellitus without complications: Secondary | ICD-10-CM | POA: Diagnosis not present

## 2017-09-14 DIAGNOSIS — R2681 Unsteadiness on feet: Secondary | ICD-10-CM | POA: Diagnosis not present

## 2017-09-14 DIAGNOSIS — Z9181 History of falling: Secondary | ICD-10-CM | POA: Diagnosis not present

## 2017-09-14 DIAGNOSIS — Z7902 Long term (current) use of antithrombotics/antiplatelets: Secondary | ICD-10-CM | POA: Diagnosis not present

## 2017-09-14 MED ORDER — INSULIN DETEMIR 100 UNIT/ML FLEXPEN: 35.0000 [IU] | PEN_INJECTOR | SUBCUTANEOUS | 11 refills | Status: DC

## 2017-09-14 NOTE — Telephone Encounter (Signed)
I called and spoke with Janelle FloorNaomi. I have faxed over new increased Levemir prescription.

## 2017-09-15 DIAGNOSIS — I10 Essential (primary) hypertension: Secondary | ICD-10-CM | POA: Diagnosis not present

## 2017-09-15 DIAGNOSIS — Z794 Long term (current) use of insulin: Secondary | ICD-10-CM | POA: Diagnosis not present

## 2017-09-15 DIAGNOSIS — M545 Low back pain: Secondary | ICD-10-CM | POA: Diagnosis not present

## 2017-09-15 DIAGNOSIS — I2510 Atherosclerotic heart disease of native coronary artery without angina pectoris: Secondary | ICD-10-CM | POA: Diagnosis not present

## 2017-09-15 DIAGNOSIS — R2681 Unsteadiness on feet: Secondary | ICD-10-CM | POA: Diagnosis not present

## 2017-09-15 DIAGNOSIS — E119 Type 2 diabetes mellitus without complications: Secondary | ICD-10-CM | POA: Diagnosis not present

## 2017-09-15 DIAGNOSIS — Z7902 Long term (current) use of antithrombotics/antiplatelets: Secondary | ICD-10-CM | POA: Diagnosis not present

## 2017-09-15 DIAGNOSIS — Z9181 History of falling: Secondary | ICD-10-CM | POA: Diagnosis not present

## 2017-09-16 DIAGNOSIS — E119 Type 2 diabetes mellitus without complications: Secondary | ICD-10-CM | POA: Diagnosis not present

## 2017-09-16 DIAGNOSIS — Z9181 History of falling: Secondary | ICD-10-CM | POA: Diagnosis not present

## 2017-09-16 DIAGNOSIS — Z794 Long term (current) use of insulin: Secondary | ICD-10-CM | POA: Diagnosis not present

## 2017-09-16 DIAGNOSIS — I2510 Atherosclerotic heart disease of native coronary artery without angina pectoris: Secondary | ICD-10-CM | POA: Diagnosis not present

## 2017-09-16 DIAGNOSIS — I10 Essential (primary) hypertension: Secondary | ICD-10-CM | POA: Diagnosis not present

## 2017-09-16 DIAGNOSIS — M545 Low back pain: Secondary | ICD-10-CM | POA: Diagnosis not present

## 2017-09-16 DIAGNOSIS — Z7902 Long term (current) use of antithrombotics/antiplatelets: Secondary | ICD-10-CM | POA: Diagnosis not present

## 2017-09-16 DIAGNOSIS — R2681 Unsteadiness on feet: Secondary | ICD-10-CM | POA: Diagnosis not present

## 2017-09-17 DIAGNOSIS — M62838 Other muscle spasm: Secondary | ICD-10-CM | POA: Diagnosis not present

## 2017-09-17 DIAGNOSIS — Z8673 Personal history of transient ischemic attack (TIA), and cerebral infarction without residual deficits: Secondary | ICD-10-CM | POA: Diagnosis not present

## 2017-09-17 DIAGNOSIS — K219 Gastro-esophageal reflux disease without esophagitis: Secondary | ICD-10-CM | POA: Diagnosis not present

## 2017-09-17 DIAGNOSIS — R2681 Unsteadiness on feet: Secondary | ICD-10-CM | POA: Diagnosis not present

## 2017-09-17 DIAGNOSIS — I2510 Atherosclerotic heart disease of native coronary artery without angina pectoris: Secondary | ICD-10-CM | POA: Diagnosis not present

## 2017-09-17 DIAGNOSIS — M545 Low back pain: Secondary | ICD-10-CM | POA: Diagnosis not present

## 2017-09-17 DIAGNOSIS — E785 Hyperlipidemia, unspecified: Secondary | ICD-10-CM | POA: Diagnosis not present

## 2017-09-17 DIAGNOSIS — I10 Essential (primary) hypertension: Secondary | ICD-10-CM | POA: Diagnosis not present

## 2017-09-17 DIAGNOSIS — G8929 Other chronic pain: Secondary | ICD-10-CM | POA: Diagnosis not present

## 2017-09-20 DIAGNOSIS — Z7902 Long term (current) use of antithrombotics/antiplatelets: Secondary | ICD-10-CM | POA: Diagnosis not present

## 2017-09-20 DIAGNOSIS — R2681 Unsteadiness on feet: Secondary | ICD-10-CM | POA: Diagnosis not present

## 2017-09-20 DIAGNOSIS — M545 Low back pain: Secondary | ICD-10-CM | POA: Diagnosis not present

## 2017-09-20 DIAGNOSIS — I2510 Atherosclerotic heart disease of native coronary artery without angina pectoris: Secondary | ICD-10-CM | POA: Diagnosis not present

## 2017-09-20 DIAGNOSIS — I739 Peripheral vascular disease, unspecified: Secondary | ICD-10-CM | POA: Diagnosis not present

## 2017-09-20 DIAGNOSIS — E119 Type 2 diabetes mellitus without complications: Secondary | ICD-10-CM | POA: Diagnosis not present

## 2017-09-20 DIAGNOSIS — I10 Essential (primary) hypertension: Secondary | ICD-10-CM | POA: Diagnosis not present

## 2017-09-20 DIAGNOSIS — Z794 Long term (current) use of insulin: Secondary | ICD-10-CM | POA: Diagnosis not present

## 2017-09-20 DIAGNOSIS — E1159 Type 2 diabetes mellitus with other circulatory complications: Secondary | ICD-10-CM | POA: Diagnosis not present

## 2017-09-20 DIAGNOSIS — Z9181 History of falling: Secondary | ICD-10-CM | POA: Diagnosis not present

## 2017-09-21 DIAGNOSIS — F419 Anxiety disorder, unspecified: Secondary | ICD-10-CM | POA: Diagnosis not present

## 2017-09-22 DIAGNOSIS — E119 Type 2 diabetes mellitus without complications: Secondary | ICD-10-CM | POA: Diagnosis not present

## 2017-09-22 DIAGNOSIS — Z794 Long term (current) use of insulin: Secondary | ICD-10-CM | POA: Diagnosis not present

## 2017-09-22 DIAGNOSIS — I10 Essential (primary) hypertension: Secondary | ICD-10-CM | POA: Diagnosis not present

## 2017-09-22 DIAGNOSIS — R2681 Unsteadiness on feet: Secondary | ICD-10-CM | POA: Diagnosis not present

## 2017-09-22 DIAGNOSIS — Z7902 Long term (current) use of antithrombotics/antiplatelets: Secondary | ICD-10-CM | POA: Diagnosis not present

## 2017-09-22 DIAGNOSIS — M545 Low back pain: Secondary | ICD-10-CM | POA: Diagnosis not present

## 2017-09-22 DIAGNOSIS — I2510 Atherosclerotic heart disease of native coronary artery without angina pectoris: Secondary | ICD-10-CM | POA: Diagnosis not present

## 2017-09-22 DIAGNOSIS — Z9181 History of falling: Secondary | ICD-10-CM | POA: Diagnosis not present

## 2017-09-24 DIAGNOSIS — R278 Other lack of coordination: Secondary | ICD-10-CM | POA: Diagnosis not present

## 2017-09-24 DIAGNOSIS — F5105 Insomnia due to other mental disorder: Secondary | ICD-10-CM | POA: Diagnosis not present

## 2017-09-24 DIAGNOSIS — M6281 Muscle weakness (generalized): Secondary | ICD-10-CM | POA: Diagnosis not present

## 2017-09-24 DIAGNOSIS — F0150 Vascular dementia without behavioral disturbance: Secondary | ICD-10-CM | POA: Diagnosis not present

## 2017-09-27 DIAGNOSIS — M545 Low back pain: Secondary | ICD-10-CM | POA: Diagnosis not present

## 2017-09-27 DIAGNOSIS — I2510 Atherosclerotic heart disease of native coronary artery without angina pectoris: Secondary | ICD-10-CM | POA: Diagnosis not present

## 2017-09-27 DIAGNOSIS — Z9181 History of falling: Secondary | ICD-10-CM | POA: Diagnosis not present

## 2017-09-27 DIAGNOSIS — Z794 Long term (current) use of insulin: Secondary | ICD-10-CM | POA: Diagnosis not present

## 2017-09-27 DIAGNOSIS — I10 Essential (primary) hypertension: Secondary | ICD-10-CM | POA: Diagnosis not present

## 2017-09-27 DIAGNOSIS — Z7902 Long term (current) use of antithrombotics/antiplatelets: Secondary | ICD-10-CM | POA: Diagnosis not present

## 2017-09-27 DIAGNOSIS — E119 Type 2 diabetes mellitus without complications: Secondary | ICD-10-CM | POA: Diagnosis not present

## 2017-09-27 DIAGNOSIS — R2681 Unsteadiness on feet: Secondary | ICD-10-CM | POA: Diagnosis not present

## 2017-09-28 DIAGNOSIS — R2681 Unsteadiness on feet: Secondary | ICD-10-CM | POA: Diagnosis not present

## 2017-09-28 DIAGNOSIS — I10 Essential (primary) hypertension: Secondary | ICD-10-CM | POA: Diagnosis not present

## 2017-09-28 DIAGNOSIS — E119 Type 2 diabetes mellitus without complications: Secondary | ICD-10-CM | POA: Diagnosis not present

## 2017-09-28 DIAGNOSIS — Z9181 History of falling: Secondary | ICD-10-CM | POA: Diagnosis not present

## 2017-09-28 DIAGNOSIS — I2510 Atherosclerotic heart disease of native coronary artery without angina pectoris: Secondary | ICD-10-CM | POA: Diagnosis not present

## 2017-09-28 DIAGNOSIS — M545 Low back pain: Secondary | ICD-10-CM | POA: Diagnosis not present

## 2017-09-28 DIAGNOSIS — Z7902 Long term (current) use of antithrombotics/antiplatelets: Secondary | ICD-10-CM | POA: Diagnosis not present

## 2017-09-28 DIAGNOSIS — Z794 Long term (current) use of insulin: Secondary | ICD-10-CM | POA: Diagnosis not present

## 2017-09-29 DIAGNOSIS — Z794 Long term (current) use of insulin: Secondary | ICD-10-CM | POA: Diagnosis not present

## 2017-09-29 DIAGNOSIS — R2681 Unsteadiness on feet: Secondary | ICD-10-CM | POA: Diagnosis not present

## 2017-09-29 DIAGNOSIS — Z9181 History of falling: Secondary | ICD-10-CM | POA: Diagnosis not present

## 2017-09-29 DIAGNOSIS — Z7902 Long term (current) use of antithrombotics/antiplatelets: Secondary | ICD-10-CM | POA: Diagnosis not present

## 2017-09-29 DIAGNOSIS — I2510 Atherosclerotic heart disease of native coronary artery without angina pectoris: Secondary | ICD-10-CM | POA: Diagnosis not present

## 2017-09-29 DIAGNOSIS — M545 Low back pain: Secondary | ICD-10-CM | POA: Diagnosis not present

## 2017-09-29 DIAGNOSIS — I10 Essential (primary) hypertension: Secondary | ICD-10-CM | POA: Diagnosis not present

## 2017-09-29 DIAGNOSIS — E119 Type 2 diabetes mellitus without complications: Secondary | ICD-10-CM | POA: Diagnosis not present

## 2017-09-30 ENCOUNTER — Telehealth: Payer: Self-pay

## 2017-09-30 NOTE — Telephone Encounter (Signed)
William Aguirre is requesting orders for this patient to be faxed to [phone]- she is trying to update the patients chart and wants to make sure she has the most current med changes

## 2017-10-01 NOTE — Telephone Encounter (Signed)
I have printed off OV notes & sent updated prescription previously faxed 6/4.

## 2017-10-04 DIAGNOSIS — I10 Essential (primary) hypertension: Secondary | ICD-10-CM | POA: Diagnosis not present

## 2017-10-04 DIAGNOSIS — I2510 Atherosclerotic heart disease of native coronary artery without angina pectoris: Secondary | ICD-10-CM | POA: Diagnosis not present

## 2017-10-04 DIAGNOSIS — M545 Low back pain: Secondary | ICD-10-CM | POA: Diagnosis not present

## 2017-10-04 DIAGNOSIS — Z7902 Long term (current) use of antithrombotics/antiplatelets: Secondary | ICD-10-CM | POA: Diagnosis not present

## 2017-10-04 DIAGNOSIS — Z794 Long term (current) use of insulin: Secondary | ICD-10-CM | POA: Diagnosis not present

## 2017-10-04 DIAGNOSIS — Z9181 History of falling: Secondary | ICD-10-CM | POA: Diagnosis not present

## 2017-10-04 DIAGNOSIS — E119 Type 2 diabetes mellitus without complications: Secondary | ICD-10-CM | POA: Diagnosis not present

## 2017-10-04 DIAGNOSIS — R2681 Unsteadiness on feet: Secondary | ICD-10-CM | POA: Diagnosis not present

## 2017-10-05 DIAGNOSIS — F419 Anxiety disorder, unspecified: Secondary | ICD-10-CM | POA: Diagnosis not present

## 2017-10-08 DIAGNOSIS — I10 Essential (primary) hypertension: Secondary | ICD-10-CM | POA: Diagnosis not present

## 2017-10-08 DIAGNOSIS — M545 Low back pain: Secondary | ICD-10-CM | POA: Diagnosis not present

## 2017-10-08 DIAGNOSIS — R2681 Unsteadiness on feet: Secondary | ICD-10-CM | POA: Diagnosis not present

## 2017-10-08 DIAGNOSIS — Z794 Long term (current) use of insulin: Secondary | ICD-10-CM | POA: Diagnosis not present

## 2017-10-08 DIAGNOSIS — E119 Type 2 diabetes mellitus without complications: Secondary | ICD-10-CM | POA: Diagnosis not present

## 2017-10-08 DIAGNOSIS — Z9181 History of falling: Secondary | ICD-10-CM | POA: Diagnosis not present

## 2017-10-08 DIAGNOSIS — I2510 Atherosclerotic heart disease of native coronary artery without angina pectoris: Secondary | ICD-10-CM | POA: Diagnosis not present

## 2017-10-08 DIAGNOSIS — Z7902 Long term (current) use of antithrombotics/antiplatelets: Secondary | ICD-10-CM | POA: Diagnosis not present

## 2017-10-12 DIAGNOSIS — R2681 Unsteadiness on feet: Secondary | ICD-10-CM | POA: Diagnosis not present

## 2017-10-12 DIAGNOSIS — Z9181 History of falling: Secondary | ICD-10-CM | POA: Diagnosis not present

## 2017-10-12 DIAGNOSIS — Z7902 Long term (current) use of antithrombotics/antiplatelets: Secondary | ICD-10-CM | POA: Diagnosis not present

## 2017-10-12 DIAGNOSIS — E119 Type 2 diabetes mellitus without complications: Secondary | ICD-10-CM | POA: Diagnosis not present

## 2017-10-12 DIAGNOSIS — M545 Low back pain: Secondary | ICD-10-CM | POA: Diagnosis not present

## 2017-10-12 DIAGNOSIS — I2510 Atherosclerotic heart disease of native coronary artery without angina pectoris: Secondary | ICD-10-CM | POA: Diagnosis not present

## 2017-10-12 DIAGNOSIS — I10 Essential (primary) hypertension: Secondary | ICD-10-CM | POA: Diagnosis not present

## 2017-10-12 DIAGNOSIS — Z794 Long term (current) use of insulin: Secondary | ICD-10-CM | POA: Diagnosis not present

## 2017-10-15 DIAGNOSIS — Z9181 History of falling: Secondary | ICD-10-CM | POA: Diagnosis not present

## 2017-10-15 DIAGNOSIS — M545 Low back pain: Secondary | ICD-10-CM | POA: Diagnosis not present

## 2017-10-15 DIAGNOSIS — Z794 Long term (current) use of insulin: Secondary | ICD-10-CM | POA: Diagnosis not present

## 2017-10-15 DIAGNOSIS — R2681 Unsteadiness on feet: Secondary | ICD-10-CM | POA: Diagnosis not present

## 2017-10-15 DIAGNOSIS — E119 Type 2 diabetes mellitus without complications: Secondary | ICD-10-CM | POA: Diagnosis not present

## 2017-10-15 DIAGNOSIS — Z7902 Long term (current) use of antithrombotics/antiplatelets: Secondary | ICD-10-CM | POA: Diagnosis not present

## 2017-10-15 DIAGNOSIS — I10 Essential (primary) hypertension: Secondary | ICD-10-CM | POA: Diagnosis not present

## 2017-10-15 DIAGNOSIS — I2510 Atherosclerotic heart disease of native coronary artery without angina pectoris: Secondary | ICD-10-CM | POA: Diagnosis not present

## 2017-10-18 DIAGNOSIS — R2681 Unsteadiness on feet: Secondary | ICD-10-CM | POA: Diagnosis not present

## 2017-10-18 DIAGNOSIS — Z9181 History of falling: Secondary | ICD-10-CM | POA: Diagnosis not present

## 2017-10-18 DIAGNOSIS — E119 Type 2 diabetes mellitus without complications: Secondary | ICD-10-CM | POA: Diagnosis not present

## 2017-10-18 DIAGNOSIS — M545 Low back pain: Secondary | ICD-10-CM | POA: Diagnosis not present

## 2017-10-18 DIAGNOSIS — Z7902 Long term (current) use of antithrombotics/antiplatelets: Secondary | ICD-10-CM | POA: Diagnosis not present

## 2017-10-18 DIAGNOSIS — I10 Essential (primary) hypertension: Secondary | ICD-10-CM | POA: Diagnosis not present

## 2017-10-18 DIAGNOSIS — I2510 Atherosclerotic heart disease of native coronary artery without angina pectoris: Secondary | ICD-10-CM | POA: Diagnosis not present

## 2017-10-18 DIAGNOSIS — Z794 Long term (current) use of insulin: Secondary | ICD-10-CM | POA: Diagnosis not present

## 2017-10-19 DIAGNOSIS — F419 Anxiety disorder, unspecified: Secondary | ICD-10-CM | POA: Diagnosis not present

## 2017-10-22 DIAGNOSIS — K219 Gastro-esophageal reflux disease without esophagitis: Secondary | ICD-10-CM | POA: Diagnosis not present

## 2017-10-22 DIAGNOSIS — M545 Low back pain: Secondary | ICD-10-CM | POA: Diagnosis not present

## 2017-10-22 DIAGNOSIS — I10 Essential (primary) hypertension: Secondary | ICD-10-CM | POA: Diagnosis not present

## 2017-10-22 DIAGNOSIS — G8929 Other chronic pain: Secondary | ICD-10-CM | POA: Diagnosis not present

## 2017-10-22 DIAGNOSIS — Z8673 Personal history of transient ischemic attack (TIA), and cerebral infarction without residual deficits: Secondary | ICD-10-CM | POA: Diagnosis not present

## 2017-10-22 DIAGNOSIS — I2510 Atherosclerotic heart disease of native coronary artery without angina pectoris: Secondary | ICD-10-CM | POA: Diagnosis not present

## 2017-10-22 DIAGNOSIS — Z794 Long term (current) use of insulin: Secondary | ICD-10-CM | POA: Diagnosis not present

## 2017-10-22 DIAGNOSIS — E119 Type 2 diabetes mellitus without complications: Secondary | ICD-10-CM | POA: Diagnosis not present

## 2017-10-22 DIAGNOSIS — M62838 Other muscle spasm: Secondary | ICD-10-CM | POA: Diagnosis not present

## 2017-10-22 DIAGNOSIS — Z7902 Long term (current) use of antithrombotics/antiplatelets: Secondary | ICD-10-CM | POA: Diagnosis not present

## 2017-10-22 DIAGNOSIS — F0150 Vascular dementia without behavioral disturbance: Secondary | ICD-10-CM | POA: Diagnosis not present

## 2017-10-22 DIAGNOSIS — F5105 Insomnia due to other mental disorder: Secondary | ICD-10-CM | POA: Diagnosis not present

## 2017-10-22 DIAGNOSIS — R2681 Unsteadiness on feet: Secondary | ICD-10-CM | POA: Diagnosis not present

## 2017-10-22 DIAGNOSIS — Z9181 History of falling: Secondary | ICD-10-CM | POA: Diagnosis not present

## 2017-10-22 DIAGNOSIS — E785 Hyperlipidemia, unspecified: Secondary | ICD-10-CM | POA: Diagnosis not present

## 2017-10-22 DIAGNOSIS — F330 Major depressive disorder, recurrent, mild: Secondary | ICD-10-CM | POA: Diagnosis not present

## 2017-10-24 DIAGNOSIS — M6281 Muscle weakness (generalized): Secondary | ICD-10-CM | POA: Diagnosis not present

## 2017-10-24 DIAGNOSIS — R278 Other lack of coordination: Secondary | ICD-10-CM | POA: Diagnosis not present

## 2017-10-26 ENCOUNTER — Telehealth: Payer: Self-pay | Admitting: Neurology

## 2017-10-26 DIAGNOSIS — M545 Low back pain: Secondary | ICD-10-CM | POA: Diagnosis not present

## 2017-10-26 DIAGNOSIS — R2681 Unsteadiness on feet: Secondary | ICD-10-CM | POA: Diagnosis not present

## 2017-10-26 DIAGNOSIS — Z794 Long term (current) use of insulin: Secondary | ICD-10-CM | POA: Diagnosis not present

## 2017-10-26 DIAGNOSIS — Z9181 History of falling: Secondary | ICD-10-CM | POA: Diagnosis not present

## 2017-10-26 DIAGNOSIS — Z7902 Long term (current) use of antithrombotics/antiplatelets: Secondary | ICD-10-CM | POA: Diagnosis not present

## 2017-10-26 DIAGNOSIS — I2510 Atherosclerotic heart disease of native coronary artery without angina pectoris: Secondary | ICD-10-CM | POA: Diagnosis not present

## 2017-10-26 DIAGNOSIS — E119 Type 2 diabetes mellitus without complications: Secondary | ICD-10-CM | POA: Diagnosis not present

## 2017-10-26 DIAGNOSIS — I10 Essential (primary) hypertension: Secondary | ICD-10-CM | POA: Diagnosis not present

## 2017-10-26 NOTE — Telephone Encounter (Signed)
Pt daughter(on DPR-[place],[name] [phone]) has called and states she has questions re: health concerns of pt.  She is asking RN calls to discuss when she is is available

## 2017-10-26 NOTE — Telephone Encounter (Signed)
Dr. Epimenio FootSater- [facility]. This is a Dr. Anne HahnWillis pt. Can you please review and let me know if we need to do anything more?   I called daughter back. Pt has not been seen in over a year -since 10-12-16. Appears he no showed appt back in November with MM,NP. [place]lee [place]Shannon lives [place] now and her daughter helps manage care. She was not aware of this. Her sister and brother also live out of state. They are not involved in care. She states he keeps complaining that he has appt for back surgery and he does not have one scheduled that she knows of. [facility] says he's becoming more difficult. Defecating on himself, using wheelchair, declining therapy they are offering. He keeps saying he is going to have back surgery. She asked if I knew anything about this. I did not see any notes in Epic. [facility]. But has not seen him recently. She is going to contact [facility] and speak with management to get more information on what is going on and call PCP. I suggested they do labs/UA to r/o causes of recent behavioral changes. They can request orders from PCP if needed.  She states this started in the last couple weeks. I scheduled appt with Dr. Anne HahnWillis on 11/16/17 at 730am for pt to f/u in our office since it's been over a year. She verbalized understanding and appreciation for call.

## 2017-10-26 NOTE — Telephone Encounter (Signed)
Agree with checking labs to see if reason for change in behavior.  F/u with Dr. Anne HahnWillis as scheduled

## 2017-10-29 DIAGNOSIS — Z794 Long term (current) use of insulin: Secondary | ICD-10-CM | POA: Diagnosis not present

## 2017-10-29 DIAGNOSIS — I2510 Atherosclerotic heart disease of native coronary artery without angina pectoris: Secondary | ICD-10-CM | POA: Diagnosis not present

## 2017-10-29 DIAGNOSIS — M545 Low back pain: Secondary | ICD-10-CM | POA: Diagnosis not present

## 2017-10-29 DIAGNOSIS — Z7902 Long term (current) use of antithrombotics/antiplatelets: Secondary | ICD-10-CM | POA: Diagnosis not present

## 2017-10-29 DIAGNOSIS — I10 Essential (primary) hypertension: Secondary | ICD-10-CM | POA: Diagnosis not present

## 2017-10-29 DIAGNOSIS — E119 Type 2 diabetes mellitus without complications: Secondary | ICD-10-CM | POA: Diagnosis not present

## 2017-10-29 DIAGNOSIS — Z9181 History of falling: Secondary | ICD-10-CM | POA: Diagnosis not present

## 2017-10-29 DIAGNOSIS — R2681 Unsteadiness on feet: Secondary | ICD-10-CM | POA: Diagnosis not present

## 2017-11-02 ENCOUNTER — Ambulatory Visit: Payer: Medicare HMO | Admitting: Endocrinology

## 2017-11-02 DIAGNOSIS — F419 Anxiety disorder, unspecified: Secondary | ICD-10-CM | POA: Diagnosis not present

## 2017-11-03 DIAGNOSIS — M545 Low back pain: Secondary | ICD-10-CM | POA: Diagnosis not present

## 2017-11-03 DIAGNOSIS — Z7902 Long term (current) use of antithrombotics/antiplatelets: Secondary | ICD-10-CM | POA: Diagnosis not present

## 2017-11-03 DIAGNOSIS — Z9181 History of falling: Secondary | ICD-10-CM | POA: Diagnosis not present

## 2017-11-03 DIAGNOSIS — I10 Essential (primary) hypertension: Secondary | ICD-10-CM | POA: Diagnosis not present

## 2017-11-03 DIAGNOSIS — Z794 Long term (current) use of insulin: Secondary | ICD-10-CM | POA: Diagnosis not present

## 2017-11-03 DIAGNOSIS — E119 Type 2 diabetes mellitus without complications: Secondary | ICD-10-CM | POA: Diagnosis not present

## 2017-11-03 DIAGNOSIS — R2681 Unsteadiness on feet: Secondary | ICD-10-CM | POA: Diagnosis not present

## 2017-11-03 DIAGNOSIS — I2510 Atherosclerotic heart disease of native coronary artery without angina pectoris: Secondary | ICD-10-CM | POA: Diagnosis not present

## 2017-11-05 DIAGNOSIS — E119 Type 2 diabetes mellitus without complications: Secondary | ICD-10-CM | POA: Diagnosis not present

## 2017-11-05 DIAGNOSIS — I10 Essential (primary) hypertension: Secondary | ICD-10-CM | POA: Diagnosis not present

## 2017-11-05 DIAGNOSIS — Z794 Long term (current) use of insulin: Secondary | ICD-10-CM | POA: Diagnosis not present

## 2017-11-05 DIAGNOSIS — R2681 Unsteadiness on feet: Secondary | ICD-10-CM | POA: Diagnosis not present

## 2017-11-05 DIAGNOSIS — M545 Low back pain: Secondary | ICD-10-CM | POA: Diagnosis not present

## 2017-11-05 DIAGNOSIS — Z9181 History of falling: Secondary | ICD-10-CM | POA: Diagnosis not present

## 2017-11-05 DIAGNOSIS — I2510 Atherosclerotic heart disease of native coronary artery without angina pectoris: Secondary | ICD-10-CM | POA: Diagnosis not present

## 2017-11-05 DIAGNOSIS — Z7902 Long term (current) use of antithrombotics/antiplatelets: Secondary | ICD-10-CM | POA: Diagnosis not present

## 2017-11-08 DIAGNOSIS — Z794 Long term (current) use of insulin: Secondary | ICD-10-CM | POA: Diagnosis not present

## 2017-11-08 DIAGNOSIS — Z7902 Long term (current) use of antithrombotics/antiplatelets: Secondary | ICD-10-CM | POA: Diagnosis not present

## 2017-11-08 DIAGNOSIS — I10 Essential (primary) hypertension: Secondary | ICD-10-CM | POA: Diagnosis not present

## 2017-11-08 DIAGNOSIS — E119 Type 2 diabetes mellitus without complications: Secondary | ICD-10-CM | POA: Diagnosis not present

## 2017-11-08 DIAGNOSIS — I2510 Atherosclerotic heart disease of native coronary artery without angina pectoris: Secondary | ICD-10-CM | POA: Diagnosis not present

## 2017-11-08 DIAGNOSIS — Z9181 History of falling: Secondary | ICD-10-CM | POA: Diagnosis not present

## 2017-11-08 DIAGNOSIS — M545 Low back pain: Secondary | ICD-10-CM | POA: Diagnosis not present

## 2017-11-08 DIAGNOSIS — R2681 Unsteadiness on feet: Secondary | ICD-10-CM | POA: Diagnosis not present

## 2017-11-10 DIAGNOSIS — Z9181 History of falling: Secondary | ICD-10-CM | POA: Diagnosis not present

## 2017-11-10 DIAGNOSIS — E119 Type 2 diabetes mellitus without complications: Secondary | ICD-10-CM | POA: Diagnosis not present

## 2017-11-10 DIAGNOSIS — Z7902 Long term (current) use of antithrombotics/antiplatelets: Secondary | ICD-10-CM | POA: Diagnosis not present

## 2017-11-10 DIAGNOSIS — Z794 Long term (current) use of insulin: Secondary | ICD-10-CM | POA: Diagnosis not present

## 2017-11-10 DIAGNOSIS — I10 Essential (primary) hypertension: Secondary | ICD-10-CM | POA: Diagnosis not present

## 2017-11-10 DIAGNOSIS — M545 Low back pain: Secondary | ICD-10-CM | POA: Diagnosis not present

## 2017-11-10 DIAGNOSIS — R2681 Unsteadiness on feet: Secondary | ICD-10-CM | POA: Diagnosis not present

## 2017-11-10 DIAGNOSIS — I2510 Atherosclerotic heart disease of native coronary artery without angina pectoris: Secondary | ICD-10-CM | POA: Diagnosis not present

## 2017-11-16 ENCOUNTER — Encounter: Payer: Self-pay | Admitting: Neurology

## 2017-11-16 ENCOUNTER — Telehealth: Payer: Self-pay | Admitting: Neurology

## 2017-11-16 ENCOUNTER — Ambulatory Visit: Payer: Medicare HMO | Admitting: Neurology

## 2017-11-16 VITALS — BP 133/82 | HR 70 | Ht 69.0 in | Wt 204.0 lb

## 2017-11-16 DIAGNOSIS — F0281 Dementia in other diseases classified elsewhere with behavioral disturbance: Secondary | ICD-10-CM | POA: Diagnosis not present

## 2017-11-16 DIAGNOSIS — F02818 Dementia in other diseases classified elsewhere, unspecified severity, with other behavioral disturbance: Secondary | ICD-10-CM

## 2017-11-16 DIAGNOSIS — Z7902 Long term (current) use of antithrombotics/antiplatelets: Secondary | ICD-10-CM | POA: Diagnosis not present

## 2017-11-16 DIAGNOSIS — G959 Disease of spinal cord, unspecified: Secondary | ICD-10-CM | POA: Diagnosis not present

## 2017-11-16 DIAGNOSIS — Z794 Long term (current) use of insulin: Secondary | ICD-10-CM | POA: Diagnosis not present

## 2017-11-16 DIAGNOSIS — Z9181 History of falling: Secondary | ICD-10-CM | POA: Diagnosis not present

## 2017-11-16 DIAGNOSIS — M545 Low back pain: Secondary | ICD-10-CM | POA: Diagnosis not present

## 2017-11-16 DIAGNOSIS — G301 Alzheimer's disease with late onset: Secondary | ICD-10-CM | POA: Diagnosis not present

## 2017-11-16 DIAGNOSIS — I2510 Atherosclerotic heart disease of native coronary artery without angina pectoris: Secondary | ICD-10-CM | POA: Diagnosis not present

## 2017-11-16 DIAGNOSIS — E119 Type 2 diabetes mellitus without complications: Secondary | ICD-10-CM | POA: Diagnosis not present

## 2017-11-16 DIAGNOSIS — R2681 Unsteadiness on feet: Secondary | ICD-10-CM | POA: Diagnosis not present

## 2017-11-16 DIAGNOSIS — I10 Essential (primary) hypertension: Secondary | ICD-10-CM | POA: Diagnosis not present

## 2017-11-16 HISTORY — DX: Disease of spinal cord, unspecified: G95.9

## 2017-11-16 MED ORDER — DONEPEZIL HCL 10 MG PO TABS: 10.0000 mg | ORAL_TABLET | Freq: Every day | ORAL | 3 refills | Status: AC

## 2017-11-16 NOTE — Telephone Encounter (Signed)
Naomi/[facility] [phone] called, she said the pt was seen today and on AVS directions read to start Aricept 5mg  at night x 1 mth and the then start 10mg  but the pt is already taking 5mg  at night and has been for awhile. Please call to discuss Also she needs order to d/scontinue flexeril. Please fax# [phone]

## 2017-11-16 NOTE — Patient Instructions (Signed)
   We will go up on the Aricept to 10 mg at night. Stop the Flexeril if he is still taking this.  Begin Aricept (donepezil) at 5 mg at night for one month. If this medication is well-tolerated, please call our office and we will call in a prescription for the 10 mg tablets. Look out for side effects that may include nausea, diarrhea, weight loss, or stomach cramps. This medication will also cause a runny nose, therefore there is no need for allergy medications for this purpose.

## 2017-11-16 NOTE — Telephone Encounter (Signed)
I called [facility], they are to go from 5 mg of Aricept at night to a 10 mg dose.  I will fax in the order for the Flexeril discontinuation.

## 2017-11-16 NOTE — Progress Notes (Signed)
Reason for visit: Memory disturbance  William Aguirre is an 83 y.o. male  History of present illness:  William Aguirre is an 83 year old right-handed black male with a history of a progressive memory disturbance.  The patient has had a significant change in his memory over the last 1 year, in July 2018, he was scoring 26/30 on his Mini-Mental status examination, now he is scoring 16/30.  The patient is residing at [facility], he has had documented mild quadriparesis, he was found to have spinal cord compression on MRI of the cervical spine.  He was referred to neurosurgery, he saw Dr. Lovell Sheehan, but the family did not wish to consider surgery for him.  The patient is using a wheelchair alternating with a walker.  He has trouble getting up from a chair.  He is having weakness in the hands, right greater than left.  He is having fecal and urinary incontinence, he wears adult diapers.  The patient requires assistance with bathing and dressing, he can feed himself.  He is having some delusional thinking that he is having surgery on his neck at any moment.  He is not having significant agitation according to the family.  The patient returns to this office for an evaluation.  He remains on low-dose Aricept, apparently he is tolerating this well.  Past Medical History:  Diagnosis Date  . Cervical myelopathy (HCC) 11/16/2017  . Coronary artery disease    Moderate LAD, Diagonal, Circumflex disease by cath 2004  . Dementia   . Diabetes mellitus without complication (HCC)   . Fall at home 07/06/2016  . Hyperlipidemia   . Hypertension   . Memory loss     Past Surgical History:  Procedure Laterality Date  . CATARACT EXTRACTION, BILATERAL      Family History  Problem Relation Age of Onset  . Other Father        Deceased  . Other Mother        Deceased, 87  . Other Sister        Deceased  . Cancer Sister        Deceased  . Healthy Son        Living  . Heart disease Son        Deceased, 30 from  heart valve dysfunction  . Healthy Daughter     Social history:  reports that he quit smoking about 52 years ago. He quit after 3.00 years of use. He has never used smokeless tobacco. He reports that he does not drink alcohol or use drugs.    Allergies  Allergen Reactions  . Hydrocodone-Acetaminophen Other (See Comments)     Delusions    Medications:  Prior to Admission medications   Medication Sig Start Date End Date Taking? Authorizing Provider  acetaminophen (TYLENOL) 325 MG tablet Take 2 tablets (650 mg total) by mouth every 6 (six) hours as needed for mild pain (or Fever >/= 101). 07/09/16   Johnson, Clanford L, MD  aspirin (SM ASPIRIN ADULT LOW STRENGTH) 81 MG EC tablet Take 1 tablet (81 mg total) by mouth daily. Swallow whole. 01/08/17   Rai, Ripudeep K, MD  atorvastatin (LIPITOR) 80 MG tablet TAKE 1 TABLET BY MOUTH ONCE DAILY Patient taking differently: TAKE 80 mg TABLET BY MOUTH ONCE DAILY 05/22/16   Kathleene Hazel, MD  bisoprolol (ZEBETA) 5 MG tablet Take 1 tablet (5 mg total) by mouth 2 (two) times daily. Patient taking differently: Take 5 mg by mouth daily.  12/04/16  Nafziger, Kandee Keenory, NP  clopidogrel (PLAVIX) 75 MG tablet TAKE 1 TABLET BY MOUTH ONCE DAILY WITH BREAKFAST Patient taking differently: TAKE 75 mg TABLET BY MOUTH ONCE DAILY WITH BREAKFAST 01/02/16   Kathleene HazelMcAlhany, Christopher D, MD  cyclobenzaprine (FLEXERIL) 5 MG tablet Take 1 tablet (5 mg total) by mouth 2 (two) times daily as needed for muscle spasms. 04/20/17   Alvira MondaySchlossman, Erin, MD  donepezil (ARICEPT) 10 MG tablet Take 1 tablet (10 mg total) by mouth at bedtime. 11/16/17   York SpanielWillis, Keyasia Jolliff K, MD  insulin aspart (NOVOLOG) 100 UNIT/ML injection Inject 3 Units into the skin 3 (three) times daily with meals. Only if cbg over 300 04/29/17   Romero BellingEllison, Sean, MD  Insulin Detemir (LEVEMIR FLEXTOUCH) 100 UNIT/ML Pen Inject 35 Units into the skin every morning. 09/14/17   Romero BellingEllison, Sean, MD  losartan (COZAAR) 50 MG tablet Take 1  tablet (50 mg total) by mouth daily. 12/04/16   Nafziger, Kandee Keenory, NP  ondansetron (ZOFRAN) 4 MG tablet Take 1 tablet (4 mg total) by mouth every 8 (eight) hours as needed for nausea or vomiting. 01/07/17   Nafziger, Kandee Keenory, NP  pantoprazole (PROTONIX) 40 MG tablet Take 1 tablet (40 mg total) by mouth daily. 01/13/17   Kathleene HazelMcAlhany, Christopher D, MD  traMADol (ULTRAM) 50 MG tablet Take 1 tablet (50 mg total) by mouth 2 (two) times daily as needed for severe pain. 01/08/17   Rai, Ripudeep Kirtland BouchardK, MD    ROS:  Out of a complete 14 system review of symptoms, the patient complains only of the following symptoms, and all other reviewed systems are negative.  Memory disturbance Walking problems  Blood pressure 133/82, pulse 70, height 5\' 9"  (1.753 m), weight 204 lb (92.5 kg).  Physical Exam  General: The patient is alert and cooperative at the time of the examination.  The patient is moderately obese.  Skin: 1+ edema below the knees is seen..   Neurologic Exam  Mental status: The patient is alert and oriented x 2 at the time of the examination (not oriented to date). The Mini-Mental status examination done today shows a total score 16/30..   Cranial nerves: Facial symmetry is present. Speech is normal, no aphasia or dysarthria is noted. Extraocular movements are full. Visual fields are full.  Motor: The patient has 4/5 strength with biceps and triceps bilaterally, significant weakness with intrinsic muscles seen bilaterally in the hands, right greater than left.  The patient has 4/5 strength with hip flexion bilaterally.  Sensory examination: Soft touch sensation is symmetric on the face, arms, and legs.  Coordination: The patient has good finger-nose-finger and heel-to-shin bilaterally.  Gait and station: The patient requires some assistance with standing, once up, he can walk with a walker.  Romberg is negative.  Reflexes: Deep tendon reflexes are symmetric, but knee jerk reflexes are elevated  bilaterally.   Assessment/Plan:  1.  Progressive memory disturbance, Alzheimer's disease  2.  Cervical myelopathy  3.  Gait disorder  The patient has had a significant change in his cognitive status since last seen.  Aricept will be increased to 10 mg at night.  If there is any evidence of agitation, other medications may need to be added.  The patient will otherwise follow-up in 6 months.  Given the fairly rapid progression of dementia, cervical spine surgery is relatively contraindicated.  If the patient is on Flexeril, I have recommended that this medication should be discontinued.  Marlan Palau. Keith Timmy Cleverly MD 11/16/2017 8:04 AM  [facility] [phone]  [address], [place] [address]  Phone [phone] Fax [phone]

## 2017-11-17 DIAGNOSIS — M545 Low back pain: Secondary | ICD-10-CM | POA: Diagnosis not present

## 2017-11-17 DIAGNOSIS — E119 Type 2 diabetes mellitus without complications: Secondary | ICD-10-CM | POA: Diagnosis not present

## 2017-11-17 DIAGNOSIS — Z9181 History of falling: Secondary | ICD-10-CM | POA: Diagnosis not present

## 2017-11-17 DIAGNOSIS — I10 Essential (primary) hypertension: Secondary | ICD-10-CM | POA: Diagnosis not present

## 2017-11-17 DIAGNOSIS — R2681 Unsteadiness on feet: Secondary | ICD-10-CM | POA: Diagnosis not present

## 2017-11-17 DIAGNOSIS — Z7902 Long term (current) use of antithrombotics/antiplatelets: Secondary | ICD-10-CM | POA: Diagnosis not present

## 2017-11-17 DIAGNOSIS — I2510 Atherosclerotic heart disease of native coronary artery without angina pectoris: Secondary | ICD-10-CM | POA: Diagnosis not present

## 2017-11-17 DIAGNOSIS — Z794 Long term (current) use of insulin: Secondary | ICD-10-CM | POA: Diagnosis not present

## 2017-11-19 DIAGNOSIS — F330 Major depressive disorder, recurrent, mild: Secondary | ICD-10-CM | POA: Diagnosis not present

## 2017-11-19 DIAGNOSIS — F5105 Insomnia due to other mental disorder: Secondary | ICD-10-CM | POA: Diagnosis not present

## 2017-11-19 DIAGNOSIS — F0150 Vascular dementia without behavioral disturbance: Secondary | ICD-10-CM | POA: Diagnosis not present

## 2017-11-22 DIAGNOSIS — E119 Type 2 diabetes mellitus without complications: Secondary | ICD-10-CM | POA: Diagnosis not present

## 2017-11-22 DIAGNOSIS — R2681 Unsteadiness on feet: Secondary | ICD-10-CM | POA: Diagnosis not present

## 2017-11-22 DIAGNOSIS — I10 Essential (primary) hypertension: Secondary | ICD-10-CM | POA: Diagnosis not present

## 2017-11-22 DIAGNOSIS — I2510 Atherosclerotic heart disease of native coronary artery without angina pectoris: Secondary | ICD-10-CM | POA: Diagnosis not present

## 2017-11-22 DIAGNOSIS — Z7902 Long term (current) use of antithrombotics/antiplatelets: Secondary | ICD-10-CM | POA: Diagnosis not present

## 2017-11-22 DIAGNOSIS — M545 Low back pain: Secondary | ICD-10-CM | POA: Diagnosis not present

## 2017-11-22 DIAGNOSIS — Z9181 History of falling: Secondary | ICD-10-CM | POA: Diagnosis not present

## 2017-11-22 DIAGNOSIS — Z794 Long term (current) use of insulin: Secondary | ICD-10-CM | POA: Diagnosis not present

## 2017-11-24 DIAGNOSIS — E119 Type 2 diabetes mellitus without complications: Secondary | ICD-10-CM | POA: Diagnosis not present

## 2017-11-24 DIAGNOSIS — Z794 Long term (current) use of insulin: Secondary | ICD-10-CM | POA: Diagnosis not present

## 2017-11-24 DIAGNOSIS — I10 Essential (primary) hypertension: Secondary | ICD-10-CM | POA: Diagnosis not present

## 2017-11-24 DIAGNOSIS — R278 Other lack of coordination: Secondary | ICD-10-CM | POA: Diagnosis not present

## 2017-11-24 DIAGNOSIS — M6281 Muscle weakness (generalized): Secondary | ICD-10-CM | POA: Diagnosis not present

## 2017-11-24 DIAGNOSIS — Z7902 Long term (current) use of antithrombotics/antiplatelets: Secondary | ICD-10-CM | POA: Diagnosis not present

## 2017-11-24 DIAGNOSIS — R2681 Unsteadiness on feet: Secondary | ICD-10-CM | POA: Diagnosis not present

## 2017-11-24 DIAGNOSIS — I2510 Atherosclerotic heart disease of native coronary artery without angina pectoris: Secondary | ICD-10-CM | POA: Diagnosis not present

## 2017-11-24 DIAGNOSIS — M545 Low back pain: Secondary | ICD-10-CM | POA: Diagnosis not present

## 2017-11-24 DIAGNOSIS — Z9181 History of falling: Secondary | ICD-10-CM | POA: Diagnosis not present

## 2017-11-26 DIAGNOSIS — K219 Gastro-esophageal reflux disease without esophagitis: Secondary | ICD-10-CM | POA: Diagnosis not present

## 2017-11-26 DIAGNOSIS — I2510 Atherosclerotic heart disease of native coronary artery without angina pectoris: Secondary | ICD-10-CM | POA: Diagnosis not present

## 2017-11-26 DIAGNOSIS — Z8673 Personal history of transient ischemic attack (TIA), and cerebral infarction without residual deficits: Secondary | ICD-10-CM | POA: Diagnosis not present

## 2017-11-26 DIAGNOSIS — R296 Repeated falls: Secondary | ICD-10-CM | POA: Diagnosis not present

## 2017-11-26 DIAGNOSIS — R2681 Unsteadiness on feet: Secondary | ICD-10-CM | POA: Diagnosis not present

## 2017-11-26 DIAGNOSIS — M62838 Other muscle spasm: Secondary | ICD-10-CM | POA: Diagnosis not present

## 2017-11-26 DIAGNOSIS — G8929 Other chronic pain: Secondary | ICD-10-CM | POA: Diagnosis not present

## 2017-11-26 DIAGNOSIS — E785 Hyperlipidemia, unspecified: Secondary | ICD-10-CM | POA: Diagnosis not present

## 2017-11-26 DIAGNOSIS — M545 Low back pain: Secondary | ICD-10-CM | POA: Diagnosis not present

## 2017-11-30 DIAGNOSIS — F419 Anxiety disorder, unspecified: Secondary | ICD-10-CM | POA: Diagnosis not present

## 2017-12-03 DIAGNOSIS — F5105 Insomnia due to other mental disorder: Secondary | ICD-10-CM | POA: Diagnosis not present

## 2017-12-03 DIAGNOSIS — F0150 Vascular dementia without behavioral disturbance: Secondary | ICD-10-CM | POA: Diagnosis not present

## 2017-12-03 DIAGNOSIS — F330 Major depressive disorder, recurrent, mild: Secondary | ICD-10-CM | POA: Diagnosis not present

## 2017-12-14 ENCOUNTER — Emergency Department (HOSPITAL_COMMUNITY)
Admission: EM | Admit: 2017-12-14 | Discharge: 2017-12-14 | Disposition: A | Discharge status: Skilled Nursing Facility | Payer: Medicare HMO | Attending: Emergency Medicine | Admitting: Emergency Medicine

## 2017-12-14 ENCOUNTER — Encounter (HOSPITAL_COMMUNITY): Payer: Self-pay

## 2017-12-14 ENCOUNTER — Emergency Department (HOSPITAL_COMMUNITY): Payer: Medicare HMO

## 2017-12-14 DIAGNOSIS — Y92129 Unspecified place in nursing home as the place of occurrence of the external cause: Secondary | ICD-10-CM | POA: Diagnosis not present

## 2017-12-14 DIAGNOSIS — R52 Pain, unspecified: Secondary | ICD-10-CM | POA: Diagnosis not present

## 2017-12-14 DIAGNOSIS — R0902 Hypoxemia: Secondary | ICD-10-CM | POA: Diagnosis not present

## 2017-12-14 DIAGNOSIS — S80911A Unspecified superficial injury of right knee, initial encounter: Secondary | ICD-10-CM | POA: Diagnosis not present

## 2017-12-14 DIAGNOSIS — W1830XA Fall on same level, unspecified, initial encounter: Secondary | ICD-10-CM | POA: Diagnosis not present

## 2017-12-14 DIAGNOSIS — W19XXXA Unspecified fall, initial encounter: Secondary | ICD-10-CM | POA: Diagnosis not present

## 2017-12-14 DIAGNOSIS — I2510 Atherosclerotic heart disease of native coronary artery without angina pectoris: Secondary | ICD-10-CM | POA: Diagnosis not present

## 2017-12-14 DIAGNOSIS — Z87891 Personal history of nicotine dependence: Secondary | ICD-10-CM | POA: Diagnosis not present

## 2017-12-14 DIAGNOSIS — Z7982 Long term (current) use of aspirin: Secondary | ICD-10-CM | POA: Diagnosis not present

## 2017-12-14 DIAGNOSIS — I10 Essential (primary) hypertension: Secondary | ICD-10-CM | POA: Insufficient documentation

## 2017-12-14 DIAGNOSIS — Y999 Unspecified external cause status: Secondary | ICD-10-CM | POA: Diagnosis not present

## 2017-12-14 DIAGNOSIS — Y939 Activity, unspecified: Secondary | ICD-10-CM | POA: Insufficient documentation

## 2017-12-14 DIAGNOSIS — Z79899 Other long term (current) drug therapy: Secondary | ICD-10-CM | POA: Insufficient documentation

## 2017-12-14 DIAGNOSIS — Z743 Need for continuous supervision: Secondary | ICD-10-CM | POA: Diagnosis not present

## 2017-12-14 DIAGNOSIS — F0390 Unspecified dementia without behavioral disturbance: Secondary | ICD-10-CM | POA: Diagnosis not present

## 2017-12-14 DIAGNOSIS — Z794 Long term (current) use of insulin: Secondary | ICD-10-CM | POA: Insufficient documentation

## 2017-12-14 DIAGNOSIS — M25461 Effusion, right knee: Secondary | ICD-10-CM | POA: Diagnosis not present

## 2017-12-14 DIAGNOSIS — S8991XA Unspecified injury of right lower leg, initial encounter: Secondary | ICD-10-CM | POA: Diagnosis not present

## 2017-12-14 DIAGNOSIS — R279 Unspecified lack of coordination: Secondary | ICD-10-CM | POA: Diagnosis not present

## 2017-12-14 DIAGNOSIS — E119 Type 2 diabetes mellitus without complications: Secondary | ICD-10-CM | POA: Insufficient documentation

## 2017-12-14 DIAGNOSIS — G459 Transient cerebral ischemic attack, unspecified: Secondary | ICD-10-CM | POA: Diagnosis not present

## 2017-12-14 DIAGNOSIS — S80919A Unspecified superficial injury of unspecified knee, initial encounter: Secondary | ICD-10-CM | POA: Diagnosis not present

## 2017-12-14 DIAGNOSIS — M25561 Pain in right knee: Secondary | ICD-10-CM | POA: Diagnosis not present

## 2017-12-14 NOTE — ED Notes (Signed)
Bed: [facility] Expected date:  Expected time:  Means of arrival:  Comments: EMS- 83yo M, R knee pain/recent pain

## 2017-12-14 NOTE — ED Notes (Signed)
Bed: [facility] Expected date:  Expected time:  Means of arrival:  Comments: Hold for room 23

## 2017-12-14 NOTE — ED Triage Notes (Signed)
Per EMS: Pt fell 2 days ago, refused to go to hospital.  Pt's c/o og R knee pain.  R knee is very swollen.  Pt is cognitively delayed, pt A&O to baseline.  Pt hx of HTN and T2DM.  Pt has an aide at home.  Pt states he did not take his BP meds this am.

## 2017-12-14 NOTE — ED Notes (Signed)
Bed: [facility] Expected date:  Expected time:  Means of arrival:  Comments: To change and clean up pt in hall c

## 2017-12-14 NOTE — ED Notes (Signed)
[facility] called for transport.  

## 2017-12-14 NOTE — Discharge Instructions (Signed)
There is no broken bones seen on x-Azyria Osmon today Knee is swollen and appears to have fluid Please have patient weight-bear only as he tolerates Referral given for follow-up with orthopedist

## 2017-12-14 NOTE — ED Provider Notes (Addendum)
[facility]-EMERGENCY DEPT Provider Note   CSN: [id number] Arrival date & time: 12/14/17  1112     History   Chief Complaint Chief Complaint  Patient presents with  . Fall  . Knee Pain    HPI William Aguirre is a 83 y.o. male.  HPI  83 year old man with developmental delay, cervical myelopathy from nursing facility presents with fall and knee pain.  Fall occurred 2 days ago.  There is no report of pain besides in the right knee.  He has ongoing pain or swelling in this area.  He is not normally ambulatory but does use a walker some.  Right knee is noted to have been painful and swollen.  Patient denies any other injury.  There is no report of head injury or anticoagulation.  Past Medical History:  Diagnosis Date  . Cervical myelopathy (HCC) 11/16/2017  . Coronary artery disease    Moderate LAD, Diagonal, Circumflex disease by cath 2004  . Dementia   . Diabetes mellitus without complication (HCC)   . Fall at home 07/06/2016  . Hyperlipidemia   . Hypertension   . Memory loss     Patient Active Problem List   Diagnosis Date Noted  . Cervical myelopathy (HCC) 11/16/2017  . TIA (transient ischemic attack) 01/07/2017  . UTI (urinary tract infection) 01/07/2017  . Stroke-like symptoms 01/06/2017  . Right knee pain 07/08/2016  . Unable to bear weight 07/08/2016  . Hypoglycemia 07/08/2016  . Upper airway cough syndrome 01/31/2015  . Dyspnea 01/31/2015  . Special screening for malignant neoplasms, colon 06/01/2013  . Mild cognitive impairment with memory loss 06/01/2013  . Unsteady gait 05/08/2013  . Hyperlipidemia 08/18/2008  . Essential hypertension 08/18/2008  . INTRAOCULAR LENS IMPLANT, HX OF 08/18/2008  . CATARACT EXTRACTION, RIGHT EYE, HX OF 08/18/2008  . Diabetes mellitus (HCC) 08/17/2008  . CORONARY ATHEROSCLEROSIS NATIVE CORONARY ARTERY 08/13/2008    Past Surgical History:  Procedure Laterality Date  . CATARACT EXTRACTION, BILATERAL           Home Medications    Prior to Admission medications   Medication Sig Start Date End Date Taking? Authorizing Provider  acetaminophen (TYLENOL) 325 MG tablet Take 2 tablets (650 mg total) by mouth every 6 (six) hours as needed for mild pain (or Fever >/= 101). 07/09/16   Johnson, Clanford L, MD  aspirin (SM ASPIRIN ADULT LOW STRENGTH) 81 MG EC tablet Take 1 tablet (81 mg total) by mouth daily. Swallow whole. 01/08/17   Rai, Ripudeep K, MD  atorvastatin (LIPITOR) 80 MG tablet TAKE 1 TABLET BY MOUTH ONCE DAILY Patient taking differently: TAKE 80 mg TABLET BY MOUTH ONCE DAILY 05/22/16   Kathleene Hazel, MD  bisoprolol (ZEBETA) 5 MG tablet Take 1 tablet (5 mg total) by mouth 2 (two) times daily. Patient taking differently: Take 5 mg by mouth daily.  12/04/16   Nafziger, Kandee Keen, NP  clopidogrel (PLAVIX) 75 MG tablet TAKE 1 TABLET BY MOUTH ONCE DAILY WITH BREAKFAST Patient taking differently: TAKE 75 mg TABLET BY MOUTH ONCE DAILY WITH BREAKFAST 01/02/16   Kathleene Hazel, MD  cyclobenzaprine (FLEXERIL) 5 MG tablet Take 1 tablet (5 mg total) by mouth 2 (two) times daily as needed for muscle spasms. 04/20/17   Alvira Monday, MD  donepezil (ARICEPT) 10 MG tablet Take 1 tablet (10 mg total) by mouth at bedtime. 11/16/17   York Spaniel, MD  insulin aspart (NOVOLOG) 100 UNIT/ML injection Inject 3 Units into the skin 3 (  three) times daily with meals. Only if cbg over 300 04/29/17   Romero Belling, MD  Insulin Detemir (LEVEMIR FLEXTOUCH) 100 UNIT/ML Pen Inject 35 Units into the skin every morning. 09/14/17   Romero Belling, MD  losartan (COZAAR) 50 MG tablet Take 1 tablet (50 mg total) by mouth daily. 12/04/16   Nafziger, Kandee Keen, NP  ondansetron (ZOFRAN) 4 MG tablet Take 1 tablet (4 mg total) by mouth every 8 (eight) hours as needed for nausea or vomiting. Patient not taking: Reported on 11/16/2017 01/07/17   Shirline Frees, NP  pantoprazole (PROTONIX) 40 MG tablet Take 1 tablet (40 mg total) by  mouth daily. 01/13/17   Kathleene Hazel, MD  traMADol (ULTRAM) 50 MG tablet Take 1 tablet (50 mg total) by mouth 2 (two) times daily as needed for severe pain. 01/08/17   Cathren Harsh, MD    Family History Family History  Problem Relation Age of Onset  . Other Father        Deceased  . Other Mother        Deceased, 77  . Other Sister        Deceased  . Cancer Sister        Deceased  . Healthy Son        Living  . Heart disease Son        Deceased, 30 from heart valve dysfunction  . Healthy Daughter     Social History Social History   Tobacco Use  . Smoking status: Former Smoker    Years: 3.00    Last attempt to quit: 04/13/1965    Years since quitting: 52.7  . Smokeless tobacco: Never Used  Substance Use Topics  . Alcohol use: No    Alcohol/week: 0.0 standard drinks  . Drug use: No     Allergies   Hydrocodone-acetaminophen   Review of Systems Review of Systems  All other systems reviewed and are negative.    Physical Exam Updated Vital Signs BP (!) 160/96   Pulse 81   Temp 98.7 F (37.1 C)   Resp 17   Ht 1.803 m (5\' 11" )   Wt 97.5 kg   SpO2 100%   BMI 29.99 kg/m   Physical Exam  Constitutional: He appears well-developed and well-nourished.  HENT:  Head: Normocephalic and atraumatic.  Right Ear: External ear normal.  Left Ear: External ear normal.  Mouth/Throat: Oropharynx is clear and moist.  Eyes: Pupils are equal, round, and reactive to light. EOM are normal.  Neck: Normal range of motion.  Cardiovascular: Normal rate and regular rhythm.  Pulmonary/Chest: Effort normal and breath sounds normal.  Abdominal: Soft.  Musculoskeletal:  No tenderness palpation of her bilateral hips Pelvis is stable with no tenderness to palpation Left knee appears normal with no tenderness Right knee is swollen and tender to palpation diffusely in the anterior aspect.  There is no ligamental instability noted There is no tenderness noted along the upper  leg or lower leg There is no tenderness or swelling noted to the ankle or foot No other extremities appear tender or have signs of injury No palpable tenderness over cervical, thoracic, or lumbar spine  Neurological: He is alert. No cranial nerve deficit. Coordination normal.  Patient has some speech hesitancy but is able to communicate no lateralized deficits noted  Skin: Skin is warm. Capillary refill takes less than 2 seconds.  Nursing note and vitals reviewed.    ED Treatments / Results  Labs (all labs ordered are  listed, but only abnormal results are displayed) Labs Reviewed - No data to display  EKG None  Radiology Dg Knee Complete 4 Views Right  Result Date: 12/14/2017 CLINICAL DATA:  Fall 2 days ago.  Pain.  Swelling. EXAM: RIGHT KNEE - COMPLETE 4+ VIEW COMPARISON:  MRI 07/08/2016. FINDINGS: Prominent suprapatellar soft tissue swelling. Prominent knee joint effusion. Tiny corticated bony densities noted adjacent to the patella most likely related old injury and/or degenerative change. No definite acute abnormality identified. Tricompartment degenerative change with chondrocalcinosis. Peripheral vascular calcification IMPRESSION: 1. Prominent suprapatellar soft tissue swelling. Prominent knee joint effusion. Tiny corticated bony densities noted adjacent to the patella most likely related to old injury and/or degenerative change. No definite acute abnormality identified. 2.  Tricompartment degenerative change with chondrocalcinosis. 3.  Peripheral vascular disease. Electronically Signed   By: Maisie Fus  Register   On: 12/14/2017 12:19    Procedures Procedures (including critical care time)  Medications Ordered in ED Medications - No data to display   Initial Impression / Assessment and Plan / ED Course  I have reviewed the triage vital signs and the nursing notes.  Pertinent labs & imaging results that were available during my care of the patient were reviewed by me and  considered in my medical decision making (see chart for details).     Patient presents with injury to right knee after fall.  X-Markise Haymer here shows effusion without any evidence of acute fracture.  He does have DJD and tricompartmental degenerative changes.  Plan patient to have anti-inflammatory pain medicine, will place Ace wrap, and he is to bear weight only as tolerated and follow-up with orthopedics Patient hypertensive here, but it was noted in chart that he did not take his a.m. blood pressure medicine Final Clinical Impressions(s) / ED Diagnoses   Final diagnoses:  Injury of right knee, initial encounter    ED Discharge Orders    None       Margarita Grizzle, MD 12/14/17 1346    Margarita Grizzle, MD 12/14/17 1346

## 2017-12-15 ENCOUNTER — Emergency Department (HOSPITAL_COMMUNITY): Payer: Medicare HMO

## 2017-12-15 ENCOUNTER — Encounter (HOSPITAL_COMMUNITY): Payer: Self-pay | Admitting: Emergency Medicine

## 2017-12-15 ENCOUNTER — Emergency Department (HOSPITAL_COMMUNITY)
Admission: EM | Admit: 2017-12-15 | Discharge: 2017-12-15 | Disposition: A | Discharge status: Home / Self Care | Payer: Medicare HMO | Attending: Emergency Medicine | Admitting: Emergency Medicine

## 2017-12-15 DIAGNOSIS — F0390 Unspecified dementia without behavioral disturbance: Secondary | ICD-10-CM | POA: Diagnosis not present

## 2017-12-15 DIAGNOSIS — Z794 Long term (current) use of insulin: Secondary | ICD-10-CM | POA: Insufficient documentation

## 2017-12-15 DIAGNOSIS — Z7982 Long term (current) use of aspirin: Secondary | ICD-10-CM | POA: Diagnosis not present

## 2017-12-15 DIAGNOSIS — Z8673 Personal history of transient ischemic attack (TIA), and cerebral infarction without residual deficits: Secondary | ICD-10-CM | POA: Insufficient documentation

## 2017-12-15 DIAGNOSIS — Z743 Need for continuous supervision: Secondary | ICD-10-CM | POA: Diagnosis not present

## 2017-12-15 DIAGNOSIS — W19XXXA Unspecified fall, initial encounter: Secondary | ICD-10-CM | POA: Diagnosis not present

## 2017-12-15 DIAGNOSIS — E785 Hyperlipidemia, unspecified: Secondary | ICD-10-CM | POA: Insufficient documentation

## 2017-12-15 DIAGNOSIS — I10 Essential (primary) hypertension: Secondary | ICD-10-CM | POA: Insufficient documentation

## 2017-12-15 DIAGNOSIS — J9811 Atelectasis: Secondary | ICD-10-CM | POA: Diagnosis not present

## 2017-12-15 DIAGNOSIS — R404 Transient alteration of awareness: Secondary | ICD-10-CM | POA: Diagnosis not present

## 2017-12-15 DIAGNOSIS — I2510 Atherosclerotic heart disease of native coronary artery without angina pectoris: Secondary | ICD-10-CM | POA: Insufficient documentation

## 2017-12-15 DIAGNOSIS — Z7902 Long term (current) use of antithrombotics/antiplatelets: Secondary | ICD-10-CM | POA: Diagnosis not present

## 2017-12-15 DIAGNOSIS — Z79899 Other long term (current) drug therapy: Secondary | ICD-10-CM | POA: Insufficient documentation

## 2017-12-15 DIAGNOSIS — E119 Type 2 diabetes mellitus without complications: Secondary | ICD-10-CM | POA: Insufficient documentation

## 2017-12-15 DIAGNOSIS — R279 Unspecified lack of coordination: Secondary | ICD-10-CM | POA: Diagnosis not present

## 2017-12-15 DIAGNOSIS — Z87891 Personal history of nicotine dependence: Secondary | ICD-10-CM | POA: Diagnosis not present

## 2017-12-15 DIAGNOSIS — R4182 Altered mental status, unspecified: Secondary | ICD-10-CM | POA: Diagnosis not present

## 2017-12-15 DIAGNOSIS — R1111 Vomiting without nausea: Secondary | ICD-10-CM | POA: Diagnosis not present

## 2017-12-15 LAB — URINALYSIS, ROUTINE W REFLEX MICROSCOPIC
Bilirubin Urine: NEGATIVE
GLUCOSE, UA: NEGATIVE mg/dL
Ketones, ur: NEGATIVE mg/dL
Leukocytes, UA: NEGATIVE
NITRITE: NEGATIVE
Protein, ur: NEGATIVE mg/dL
SPECIFIC GRAVITY, URINE: 1.02 (ref 1.005–1.030)
pH: 5 (ref 5.0–8.0)

## 2017-12-15 LAB — COMPREHENSIVE METABOLIC PANEL
ALBUMIN: 3.4 g/dL — AB (ref 3.5–5.0)
ALK PHOS: 114 U/L (ref 38–126)
ALT: 21 U/L (ref 0–44)
AST: 32 U/L (ref 15–41)
Anion gap: 10 (ref 5–15)
BILIRUBIN TOTAL: 1.3 mg/dL — AB (ref 0.3–1.2)
BUN: 11 mg/dL (ref 8–23)
CO2: 27 mmol/L (ref 22–32)
Calcium: 9.4 mg/dL (ref 8.9–10.3)
Chloride: 103 mmol/L (ref 98–111)
Creatinine, Ser: 1.11 mg/dL (ref 0.61–1.24)
GFR calc Af Amer: >60 mL/min (ref >60)
GFR calc non Af Amer: >60 mL/min (ref >60)
GLUCOSE: 170 mg/dL — AB (ref 70–99)
Potassium: 4.3 mmol/L (ref 3.5–5.1)
Sodium: 140 mmol/L (ref 135–145)
TOTAL PROTEIN: 7.9 g/dL (ref 6.5–8.1)

## 2017-12-15 LAB — CBC WITH DIFFERENTIAL/PLATELET
ABS IMMATURE GRANULOCYTES: 0.1 10*3/uL (ref 0.0–0.1)
Basophils Absolute: 0 10*3/uL (ref 0.0–0.1)
Basophils Relative: 0 %
Eosinophils Absolute: 0 10*3/uL (ref 0.0–0.7)
Eosinophils Relative: 0 %
HEMATOCRIT: 43.9 % (ref 39.0–52.0)
Hemoglobin: 13.8 g/dL (ref 13.0–17.0)
Immature Granulocytes: 0 %
LYMPHS ABS: 1.3 10*3/uL (ref 0.7–4.0)
LYMPHS PCT: 9 %
MCH: 27 pg (ref 26.0–34.0)
MCHC: 31.4 g/dL (ref 30.0–36.0)
MCV: 85.7 fL (ref 78.0–100.0)
Monocytes Absolute: 1.9 10*3/uL — ABNORMAL HIGH (ref 0.1–1.0)
Monocytes Relative: 13 %
Neutro Abs: 11.4 10*3/uL — ABNORMAL HIGH (ref 1.7–7.7)
Neutrophils Relative %: 78 %
Platelets: 177 10*3/uL (ref 150–400)
RBC: 5.12 MIL/uL (ref 4.22–5.81)
RDW: 13.5 % (ref 11.5–15.5)
WBC: 14.7 10*3/uL — AB (ref 4.0–10.5)

## 2017-12-15 LAB — PROTIME-INR
INR: 1.31
Prothrombin Time: 16.2 seconds — ABNORMAL HIGH (ref 11.4–15.2)

## 2017-12-15 NOTE — ED Notes (Signed)
Attempted to call report back to [facility]. Transferred to nursing station and reached voicemail.

## 2017-12-15 NOTE — ED Notes (Signed)
Patient verbalizes understanding of discharge instructions. Opportunity for questioning and answers were provided. Armband removed by staff, pt discharged from ED to [facility] via [facility].

## 2017-12-15 NOTE — ED Provider Notes (Signed)
83 yo M with a chief complaint of altered mental status.  This was noticed this morning.  The patient is typically demented but they thought it had gotten suddenly worse.  He has a history of E. coli urinary tract infections.  This was thought to be the source.  He has a mild leukocytosis.  Plan is for urine to return and then admit for encephalopathy of unknown etiology.  I reassessed the patient after his labs did come back.  I added on a chest x-ray to assess for occult pneumonia.  The patient is feeling much better currently.  States he has no complaints.  Is able to talk with me in a cognizant manner.  I offered admission and at this point the patient would rather return back to where he stays.  At this point I will discharge him home.  Have him follow-up with his family physician.   Melene Plan, DO 12/15/17 1749

## 2017-12-15 NOTE — ED Notes (Signed)
Naomi at [facility] reports that PT has deteriorated over the last few days. She reports AMS started yesterday between 10am-10:30am. PT used to carry on conversations and joke with employees. PT stopped responding the same way yesterday. PT has been quiet and combative with staff there.

## 2017-12-15 NOTE — ED Notes (Signed)
Pt provided with water and encouraged to urinate, reminded that he has condom catheter in place.

## 2017-12-15 NOTE — ED Triage Notes (Signed)
PT fell on Sunday. Was evaluated in ED yesterday for R knee pain. Facility reports to EMS that they rounded on him this morning and PT was alterered. Staff decribes combative behavior and refusing meds per EMS. EMS reports PT was alert to self and place for them. PT was able to answer questions and follow commands per EMS. History of dementia.   PT arrives to ED and does not say anything during trasfer to bed or report. PT has not spoken since arrival to ED. Just watches nurse. Only command Pt will follow is to close his eyes

## 2017-12-15 NOTE — ED Provider Notes (Signed)
[facility] EMERGENCY DEPARTMENT Provider Note   CSN: [id number] Arrival date & time: 12/15/17  1256     History   Chief Complaint Chief Complaint  Patient presents with  . Altered Mental Status    HPI William Aguirre is a 83 y.o. male.  HPI Patient was evaluated yesterday after a fall 3 days ago.  Complaining of right knee pain with right knee swelling.  X-rays with effusion present but no fractures.  Placed in Ace bandage.  Per nursing home staff patient has been increasingly confused since yesterday.  Becoming more combative.  Unable to contribute to history.  Level 5 caveat applies. Past Medical History:  Diagnosis Date  . Cervical myelopathy (HCC) 11/16/2017  . Coronary artery disease    Moderate LAD, Diagonal, Circumflex disease by cath 2004  . Dementia   . Diabetes mellitus without complication (HCC)   . Fall at home 07/06/2016  . Hyperlipidemia   . Hypertension   . Memory loss     Patient Active Problem List   Diagnosis Date Noted  . Cervical myelopathy (HCC) 11/16/2017  . TIA (transient ischemic attack) 01/07/2017  . UTI (urinary tract infection) 01/07/2017  . Stroke-like symptoms 01/06/2017  . Right knee pain 07/08/2016  . Unable to bear weight 07/08/2016  . Hypoglycemia 07/08/2016  . Upper airway cough syndrome 01/31/2015  . Dyspnea 01/31/2015  . Special screening for malignant neoplasms, colon 06/01/2013  . Mild cognitive impairment with memory loss 06/01/2013  . Unsteady gait 05/08/2013  . Hyperlipidemia 08/18/2008  . Essential hypertension 08/18/2008  . INTRAOCULAR LENS IMPLANT, HX OF 08/18/2008  . CATARACT EXTRACTION, RIGHT EYE, HX OF 08/18/2008  . Diabetes mellitus (HCC) 08/17/2008  . CORONARY ATHEROSCLEROSIS NATIVE CORONARY ARTERY 08/13/2008    Past Surgical History:  Procedure Laterality Date  . CATARACT EXTRACTION, BILATERAL          Home Medications    Prior to Admission medications   Medication Sig Start Date End  Date Taking? Authorizing Provider  acetaminophen (TYLENOL) 325 MG tablet Take 2 tablets (650 mg total) by mouth every 6 (six) hours as needed for mild pain (or Fever >/= 101). 07/09/16   Johnson, Clanford L, MD  aspirin (SM ASPIRIN ADULT LOW STRENGTH) 81 MG EC tablet Take 1 tablet (81 mg total) by mouth daily. Swallow whole. 01/08/17   Rai, Ripudeep K, MD  atorvastatin (LIPITOR) 80 MG tablet TAKE 1 TABLET BY MOUTH ONCE DAILY Patient taking differently: TAKE 80 mg TABLET BY MOUTH ONCE DAILY 05/22/16   Kathleene Hazel, MD  bisoprolol (ZEBETA) 5 MG tablet Take 1 tablet (5 mg total) by mouth 2 (two) times daily. Patient taking differently: Take 5 mg by mouth daily.  12/04/16   Nafziger, Kandee Keen, NP  clopidogrel (PLAVIX) 75 MG tablet TAKE 1 TABLET BY MOUTH ONCE DAILY WITH BREAKFAST Patient taking differently: TAKE 75 mg TABLET BY MOUTH ONCE DAILY WITH BREAKFAST 01/02/16   Kathleene Hazel, MD  cyclobenzaprine (FLEXERIL) 5 MG tablet Take 1 tablet (5 mg total) by mouth 2 (two) times daily as needed for muscle spasms. 04/20/17   Alvira Monday, MD  donepezil (ARICEPT) 10 MG tablet Take 1 tablet (10 mg total) by mouth at bedtime. 11/16/17   York Spaniel, MD  insulin aspart (NOVOLOG) 100 UNIT/ML injection Inject 3 Units into the skin 3 (three) times daily with meals. Only if cbg over 300 04/29/17   Romero Belling, MD  Insulin Detemir (LEVEMIR FLEXTOUCH) 100 UNIT/ML Pen Inject 35  Units into the skin every morning. 09/14/17   Romero Belling, MD  losartan (COZAAR) 50 MG tablet Take 1 tablet (50 mg total) by mouth daily. 12/04/16   Nafziger, Kandee Keen, NP  ondansetron (ZOFRAN) 4 MG tablet Take 1 tablet (4 mg total) by mouth every 8 (eight) hours as needed for nausea or vomiting. Patient not taking: Reported on 11/16/2017 01/07/17   Shirline Frees, NP  pantoprazole (PROTONIX) 40 MG tablet Take 1 tablet (40 mg total) by mouth daily. 01/13/17   Kathleene Hazel, MD  traMADol (ULTRAM) 50 MG tablet Take 1 tablet (50  mg total) by mouth 2 (two) times daily as needed for severe pain. 01/08/17   Cathren Harsh, MD    Family History Family History  Problem Relation Age of Onset  . Other Father        Deceased  . Other Mother        Deceased, 107  . Other Sister        Deceased  . Cancer Sister        Deceased  . Healthy Son        Living  . Heart disease Son        Deceased, 30 from heart valve dysfunction  . Healthy Daughter     Social History Social History   Tobacco Use  . Smoking status: Former Smoker    Years: 3.00    Last attempt to quit: 04/13/1965    Years since quitting: 52.7  . Smokeless tobacco: Never Used  Substance Use Topics  . Alcohol use: No    Alcohol/week: 0.0 standard drinks  . Drug use: No     Allergies   Hydrocodone-acetaminophen   Review of Systems Review of Systems  Unable to perform ROS: Dementia     Physical Exam Updated Vital Signs BP (!) 172/86   Pulse 72   Temp 98.7 F (37.1 C) (Oral)   Resp 14   SpO2 93%   Physical Exam  Constitutional: He appears well-developed and well-nourished.  HENT:  Head: Normocephalic and atraumatic.  Mouth/Throat: Oropharynx is clear and moist. No oropharyngeal exudate.  Eyes: Pupils are equal, round, and reactive to light. EOM are normal.  Neck: Normal range of motion. Neck supple.  No meningismus  Cardiovascular: Normal rate and regular rhythm. Exam reveals no gallop and no friction rub.  No murmur heard. Pulmonary/Chest: Effort normal and breath sounds normal.  Abdominal: Soft. Bowel sounds are normal. There is no tenderness. There is no rebound and no guarding.  Musculoskeletal: He exhibits edema and tenderness.  Patient with erythematous, warm and swollen right knee with large effusion present.  Diffuse tenderness to palpation and pain with attempted range of motion.  Neurological: He is alert.  Follows simple commands.  Skin: Skin is warm and dry. No rash noted. No erythema.  Psychiatric: He has a normal  mood and affect. His behavior is normal.  Nursing note and vitals reviewed.    ED Treatments / Results  Labs (all labs ordered are listed, but only abnormal results are displayed) Labs Reviewed  CBC WITH DIFFERENTIAL/PLATELET - Abnormal; Notable for the following components:      Result Value   WBC 14.7 (*)    Neutro Abs 11.4 (*)    Monocytes Absolute 1.9 (*)    All other components within normal limits  COMPREHENSIVE METABOLIC PANEL - Abnormal; Notable for the following components:   Glucose, Bld 170 (*)    Albumin 3.4 (*)  Total Bilirubin 1.3 (*)    All other components within normal limits  PROTIME-INR - Abnormal; Notable for the following components:   Prothrombin Time 16.2 (*)    All other components within normal limits  URINALYSIS, ROUTINE W REFLEX MICROSCOPIC - Abnormal; Notable for the following components:   Hgb urine dipstick SMALL (*)    Bacteria, UA RARE (*)    All other components within normal limits    EKG EKG Interpretation  Date/Time:  Wednesday December 15 2017 14:21:31 EDT Ventricular Rate:  75 PR Interval:    QRS Duration: 104 QT Interval:  372 QTC Calculation: 416 R Axis:   82 Text Interpretation:  Sinus rhythm Borderline right axis deviation Anteroseptal infarct, old Minimal ST depression, lateral leads No significant change since last tracing Confirmed by Melene Plan [phone]) on 12/15/2017 4:03:47 PM   Radiology Dg Chest 2 View  Result Date: 12/15/2017 CLINICAL DATA:  Altered mental status EXAM: CHEST - 2 VIEW COMPARISON:  01/06/2017 FINDINGS: Low lung volumes with slight crowding of interstitial lung markings. Chronic elevation of the right hemidiaphragm. Top normal size cardiac silhouette with mild aortic atherosclerosis. No aneurysm is identified. Degenerative changes are present about the [facility] and glenohumeral joints. No alveolar consolidation, effusion or pneumothorax. No overt pulmonary edema. Subsegmental atelectasis is seen in the left mid  lung. IMPRESSION: Low lung volumes without active pulmonary disease. Aortic atherosclerosis (ICD10-I70.0) Electronically Signed   By: Tollie Eth M.D.   On: 12/15/2017 17:34   Ct Head Wo Contrast  Result Date: 12/15/2017 CLINICAL DATA:  Altered mental status EXAM: CT HEAD WITHOUT CONTRAST TECHNIQUE: Contiguous axial images were obtained from the base of the skull through the vertex without intravenous contrast. COMPARISON:  Head CT 01/06/2017 FINDINGS: Brain: There is no mass, hemorrhage or extra-axial collection. There is generalized atrophy without lobar predilection. There is hypoattenuation of the periventricular white matter, most commonly indicating chronic ischemic microangiopathy. Vascular: Atherosclerotic calcification of the internal carotid arteries at the skull base. No abnormal hyperdensity of the major intracranial arteries or dural venous sinuses. Skull: The visualized skull base, calvarium and extracranial soft tissues are normal. Sinuses/Orbits: No fluid levels or advanced mucosal thickening of the visualized paranasal sinuses. No mastoid or middle ear effusion. The orbits are normal. IMPRESSION: 1. No acute intracranial abnormality. 2. Atrophy and chronic ischemic microangiopathy. Electronically Signed   By: Deatra Robinson M.D.   On: 12/15/2017 14:26   Dg Knee Complete 4 Views Right  Result Date: 12/14/2017 CLINICAL DATA:  Fall 2 days ago.  Pain.  Swelling. EXAM: RIGHT KNEE - COMPLETE 4+ VIEW COMPARISON:  MRI 07/08/2016. FINDINGS: Prominent suprapatellar soft tissue swelling. Prominent knee joint effusion. Tiny corticated bony densities noted adjacent to the patella most likely related old injury and/or degenerative change. No definite acute abnormality identified. Tricompartment degenerative change with chondrocalcinosis. Peripheral vascular calcification IMPRESSION: 1. Prominent suprapatellar soft tissue swelling. Prominent knee joint effusion. Tiny corticated bony densities noted adjacent  to the patella most likely related to old injury and/or degenerative change. No definite acute abnormality identified. 2.  Tricompartment degenerative change with chondrocalcinosis. 3.  Peripheral vascular disease. Electronically Signed   By: Maisie Fus  Register   On: 12/14/2017 12:19    Procedures Procedures (including critical care time)  Medications Ordered in ED Medications - No data to display   Initial Impression / Assessment and Plan / ED Course  I have reviewed the triage vital signs and the nursing notes.  Pertinent labs & imaging results that were available  during my care of the patient were reviewed by me and considered in my medical decision making (see chart for details).     Patient with altered mental status over baseline dementia.  Mild elevation in white blood cell count.  CT head without acute findings.  Signed out to oncoming emergency physician pending UA and reevaluation.  Final Clinical Impressions(s) / ED Diagnoses   Final diagnoses:  Transient alteration of awareness    ED Discharge Orders    None       Loren Racer, MD 12/16/17 [phone]

## 2017-12-16 DIAGNOSIS — E1159 Type 2 diabetes mellitus with other circulatory complications: Secondary | ICD-10-CM | POA: Diagnosis not present

## 2017-12-16 DIAGNOSIS — I739 Peripheral vascular disease, unspecified: Secondary | ICD-10-CM | POA: Diagnosis not present

## 2017-12-16 DIAGNOSIS — L603 Nail dystrophy: Secondary | ICD-10-CM | POA: Diagnosis not present

## 2017-12-16 DIAGNOSIS — Q845 Enlarged and hypertrophic nails: Secondary | ICD-10-CM | POA: Diagnosis not present

## 2017-12-17 DIAGNOSIS — F330 Major depressive disorder, recurrent, mild: Secondary | ICD-10-CM | POA: Diagnosis not present

## 2017-12-17 DIAGNOSIS — F0150 Vascular dementia without behavioral disturbance: Secondary | ICD-10-CM | POA: Diagnosis not present

## 2017-12-17 DIAGNOSIS — F5105 Insomnia due to other mental disorder: Secondary | ICD-10-CM | POA: Diagnosis not present

## 2017-12-21 ENCOUNTER — Encounter (HOSPITAL_COMMUNITY): Payer: Self-pay

## 2017-12-21 ENCOUNTER — Emergency Department (HOSPITAL_COMMUNITY): Payer: Medicare HMO

## 2017-12-21 ENCOUNTER — Inpatient Hospital Stay (HOSPITAL_COMMUNITY)
Admission: EM | Admit: 2017-12-21 | Discharge: 2017-12-29 | DRG: 689 | Disposition: A | Discharge status: Skilled Nursing Facility | Payer: Medicare HMO | Attending: Family Medicine | Admitting: Family Medicine

## 2017-12-21 DIAGNOSIS — N10 Acute tubulo-interstitial nephritis: Secondary | ICD-10-CM | POA: Diagnosis not present

## 2017-12-21 DIAGNOSIS — Z6825 Body mass index (BMI) 25.0-25.9, adult: Secondary | ICD-10-CM

## 2017-12-21 DIAGNOSIS — F419 Anxiety disorder, unspecified: Secondary | ICD-10-CM | POA: Diagnosis not present

## 2017-12-21 DIAGNOSIS — M5000 Cervical disc disorder with myelopathy, unspecified cervical region: Secondary | ICD-10-CM | POA: Diagnosis not present

## 2017-12-21 DIAGNOSIS — Z66 Do not resuscitate: Secondary | ICD-10-CM | POA: Diagnosis not present

## 2017-12-21 DIAGNOSIS — Z79899 Other long term (current) drug therapy: Secondary | ICD-10-CM

## 2017-12-21 DIAGNOSIS — Y92129 Unspecified place in nursing home as the place of occurrence of the external cause: Secondary | ICD-10-CM | POA: Diagnosis not present

## 2017-12-21 DIAGNOSIS — E860 Dehydration: Secondary | ICD-10-CM | POA: Diagnosis present

## 2017-12-21 DIAGNOSIS — F0390 Unspecified dementia without behavioral disturbance: Secondary | ICD-10-CM

## 2017-12-21 DIAGNOSIS — Z515 Encounter for palliative care: Secondary | ICD-10-CM | POA: Diagnosis not present

## 2017-12-21 DIAGNOSIS — F329 Major depressive disorder, single episode, unspecified: Secondary | ICD-10-CM | POA: Diagnosis present

## 2017-12-21 DIAGNOSIS — Z885 Allergy status to narcotic agent status: Secondary | ICD-10-CM

## 2017-12-21 DIAGNOSIS — I10 Essential (primary) hypertension: Secondary | ICD-10-CM | POA: Diagnosis not present

## 2017-12-21 DIAGNOSIS — N390 Urinary tract infection, site not specified: Secondary | ICD-10-CM | POA: Diagnosis present

## 2017-12-21 DIAGNOSIS — E46 Unspecified protein-calorie malnutrition: Secondary | ICD-10-CM | POA: Diagnosis not present

## 2017-12-21 DIAGNOSIS — W19XXXA Unspecified fall, initial encounter: Secondary | ICD-10-CM | POA: Diagnosis not present

## 2017-12-21 DIAGNOSIS — E119 Type 2 diabetes mellitus without complications: Secondary | ICD-10-CM | POA: Diagnosis present

## 2017-12-21 DIAGNOSIS — I2510 Atherosclerotic heart disease of native coronary artery without angina pectoris: Secondary | ICD-10-CM | POA: Diagnosis present

## 2017-12-21 DIAGNOSIS — B964 Proteus (mirabilis) (morganii) as the cause of diseases classified elsewhere: Secondary | ICD-10-CM | POA: Diagnosis present

## 2017-12-21 DIAGNOSIS — R402253 Coma scale, best verbal response, oriented, at hospital admission: Secondary | ICD-10-CM | POA: Diagnosis present

## 2017-12-21 DIAGNOSIS — N3000 Acute cystitis without hematuria: Secondary | ICD-10-CM | POA: Diagnosis not present

## 2017-12-21 DIAGNOSIS — Z7982 Long term (current) use of aspirin: Secondary | ICD-10-CM

## 2017-12-21 DIAGNOSIS — Z87891 Personal history of nicotine dependence: Secondary | ICD-10-CM

## 2017-12-21 DIAGNOSIS — M25461 Effusion, right knee: Secondary | ICD-10-CM | POA: Diagnosis present

## 2017-12-21 DIAGNOSIS — S7002XA Contusion of left hip, initial encounter: Secondary | ICD-10-CM | POA: Diagnosis present

## 2017-12-21 DIAGNOSIS — R413 Other amnesia: Secondary | ICD-10-CM | POA: Diagnosis present

## 2017-12-21 DIAGNOSIS — M25552 Pain in left hip: Secondary | ICD-10-CM | POA: Diagnosis not present

## 2017-12-21 DIAGNOSIS — Z7902 Long term (current) use of antithrombotics/antiplatelets: Secondary | ICD-10-CM

## 2017-12-21 DIAGNOSIS — Z7189 Other specified counseling: Secondary | ICD-10-CM

## 2017-12-21 DIAGNOSIS — M25551 Pain in right hip: Secondary | ICD-10-CM | POA: Diagnosis not present

## 2017-12-21 DIAGNOSIS — R319 Hematuria, unspecified: Secondary | ICD-10-CM | POA: Diagnosis present

## 2017-12-21 DIAGNOSIS — Z886 Allergy status to analgesic agent status: Secondary | ICD-10-CM

## 2017-12-21 DIAGNOSIS — Z794 Long term (current) use of insulin: Secondary | ICD-10-CM

## 2017-12-21 DIAGNOSIS — Z23 Encounter for immunization: Secondary | ICD-10-CM | POA: Diagnosis not present

## 2017-12-21 DIAGNOSIS — R627 Adult failure to thrive: Secondary | ICD-10-CM | POA: Diagnosis present

## 2017-12-21 DIAGNOSIS — R4182 Altered mental status, unspecified: Secondary | ICD-10-CM | POA: Diagnosis not present

## 2017-12-21 DIAGNOSIS — R740 Nonspecific elevation of levels of transaminase and lactic acid dehydrogenase [LDH]: Secondary | ICD-10-CM | POA: Diagnosis present

## 2017-12-21 DIAGNOSIS — Z993 Dependence on wheelchair: Secondary | ICD-10-CM

## 2017-12-21 DIAGNOSIS — E1142 Type 2 diabetes mellitus with diabetic polyneuropathy: Secondary | ICD-10-CM

## 2017-12-21 DIAGNOSIS — R404 Transient alteration of awareness: Secondary | ICD-10-CM | POA: Diagnosis not present

## 2017-12-21 DIAGNOSIS — G9341 Metabolic encephalopathy: Secondary | ICD-10-CM | POA: Diagnosis present

## 2017-12-21 DIAGNOSIS — R402363 Coma scale, best motor response, obeys commands, at hospital admission: Secondary | ICD-10-CM | POA: Diagnosis present

## 2017-12-21 DIAGNOSIS — R278 Other lack of coordination: Secondary | ICD-10-CM | POA: Diagnosis not present

## 2017-12-21 DIAGNOSIS — M6281 Muscle weakness (generalized): Secondary | ICD-10-CM | POA: Diagnosis not present

## 2017-12-21 DIAGNOSIS — E785 Hyperlipidemia, unspecified: Secondary | ICD-10-CM | POA: Diagnosis present

## 2017-12-21 LAB — COMPREHENSIVE METABOLIC PANEL
ALT: 39 U/L (ref 0–44)
AST: 48 U/L — ABNORMAL HIGH (ref 15–41)
Albumin: 2.9 g/dL — ABNORMAL LOW (ref 3.5–5.0)
Alkaline Phosphatase: 128 U/L — ABNORMAL HIGH (ref 38–126)
Anion gap: 12 (ref 5–15)
BUN: 33 mg/dL — ABNORMAL HIGH (ref 8–23)
CHLORIDE: 105 mmol/L (ref 98–111)
CO2: 25 mmol/L (ref 22–32)
Calcium: 9.2 mg/dL (ref 8.9–10.3)
Creatinine, Ser: 1.14 mg/dL (ref 0.61–1.24)
GFR, EST NON AFRICAN AMERICAN: 58 mL/min — AB (ref >60)
Glucose, Bld: 134 mg/dL — ABNORMAL HIGH (ref 70–99)
Potassium: 4.1 mmol/L (ref 3.5–5.1)
SODIUM: 142 mmol/L (ref 135–145)
Total Bilirubin: 0.8 mg/dL (ref 0.3–1.2)
Total Protein: 7.9 g/dL (ref 6.5–8.1)

## 2017-12-21 LAB — URINALYSIS, ROUTINE W REFLEX MICROSCOPIC
Bilirubin Urine: NEGATIVE
Glucose, UA: NEGATIVE mg/dL
Ketones, ur: NEGATIVE mg/dL
Nitrite: NEGATIVE
PROTEIN: 30 mg/dL — AB
Specific Gravity, Urine: 1.018 (ref 1.005–1.030)
pH: 5 (ref 5.0–8.0)

## 2017-12-21 LAB — I-STAT CHEM 8, ED
BUN: 30 mg/dL — ABNORMAL HIGH (ref 8–23)
CREATININE: 1.1 mg/dL (ref 0.61–1.24)
Calcium, Ion: 1.17 mmol/L (ref 1.15–1.40)
Chloride: 108 mmol/L (ref 98–111)
GLUCOSE: 136 mg/dL — AB (ref 70–99)
HEMATOCRIT: 41 % (ref 39.0–52.0)
HEMOGLOBIN: 13.9 g/dL (ref 13.0–17.0)
POTASSIUM: 4.1 mmol/L (ref 3.5–5.1)
Sodium: 141 mmol/L (ref 135–145)
TCO2: 25 mmol/L (ref 22–32)

## 2017-12-21 LAB — CBC WITH DIFFERENTIAL/PLATELET
Basophils Absolute: 0 10*3/uL (ref 0.0–0.1)
Basophils Relative: 0 %
Eosinophils Absolute: 0 10*3/uL (ref 0.0–0.7)
Eosinophils Relative: 0 %
HCT: 41.1 % (ref 39.0–52.0)
Hemoglobin: 13.6 g/dL (ref 13.0–17.0)
LYMPHS PCT: 6 %
Lymphs Abs: 1.9 10*3/uL (ref 0.7–4.0)
MCH: 27.8 pg (ref 26.0–34.0)
MCHC: 33.1 g/dL (ref 30.0–36.0)
MCV: 84 fL (ref 78.0–100.0)
MONO ABS: 3.2 10*3/uL — AB (ref 0.1–1.0)
MONOS PCT: 10 %
Neutro Abs: 26.6 10*3/uL — ABNORMAL HIGH (ref 1.7–7.7)
Neutrophils Relative %: 84 %
PLATELETS: 247 10*3/uL (ref 150–400)
RBC: 4.89 MIL/uL (ref 4.22–5.81)
RDW: 13.9 % (ref 11.5–15.5)
WBC: 31.7 10*3/uL — ABNORMAL HIGH (ref 4.0–10.5)

## 2017-12-21 LAB — CBG MONITORING, ED: GLUCOSE-CAPILLARY: 129 mg/dL — AB (ref 70–99)

## 2017-12-21 LAB — ETHANOL

## 2017-12-21 LAB — TROPONIN I: Troponin I: <0.03 ng/mL (ref <0.03)

## 2017-12-21 LAB — TSH: TSH: 1.348 u[IU]/mL (ref 0.350–4.500)

## 2017-12-21 LAB — AMMONIA: AMMONIA: 9 umol/L (ref 9–35)

## 2017-12-21 LAB — I-STAT CG4 LACTIC ACID, ED: LACTIC ACID, VENOUS: 1.48 mmol/L (ref 0.5–1.9)

## 2017-12-21 MED ORDER — SODIUM CHLORIDE 0.9 % IV SOLN
1.0000 g | Freq: Once | INTRAVENOUS | Status: AC
  Administered 2017-12-21: 1 g via INTRAVENOUS
  Filled 2017-12-21: qty 10

## 2017-12-21 MED ORDER — SODIUM CHLORIDE 0.9 % IV BOLUS
1000.0000 mL | Freq: Once | INTRAVENOUS | Status: AC
  Administered 2017-12-21: 1000 mL via INTRAVENOUS

## 2017-12-21 MED ORDER — INSULIN ASPART 100 UNIT/ML ~~LOC~~ SOLN
0.0000 [IU] | Freq: Three times a day (TID) | SUBCUTANEOUS | Status: DC
  Administered 2017-12-22: 2 [IU] via SUBCUTANEOUS
  Administered 2017-12-23: 1 [IU] via SUBCUTANEOUS
  Administered 2017-12-23: 3 [IU] via SUBCUTANEOUS
  Administered 2017-12-23 – 2017-12-24 (×2): 2 [IU] via SUBCUTANEOUS
  Administered 2017-12-24: 3 [IU] via SUBCUTANEOUS
  Administered 2017-12-24: 2 [IU] via SUBCUTANEOUS
  Administered 2017-12-25: 5 [IU] via SUBCUTANEOUS
  Administered 2017-12-25: 3 [IU] via SUBCUTANEOUS
  Administered 2017-12-25: 1 [IU] via SUBCUTANEOUS
  Administered 2017-12-26: 2 [IU] via SUBCUTANEOUS
  Administered 2017-12-26 (×2): 5 [IU] via SUBCUTANEOUS
  Administered 2017-12-27 (×2): 1 [IU] via SUBCUTANEOUS
  Administered 2017-12-28: 3 [IU] via SUBCUTANEOUS
  Administered 2017-12-28: 1 [IU] via SUBCUTANEOUS
  Administered 2017-12-28 – 2017-12-29 (×2): 2 [IU] via SUBCUTANEOUS

## 2017-12-21 MED ORDER — SERTRALINE HCL 50 MG PO TABS
50.0000 mg | ORAL_TABLET | Freq: Every day | ORAL | Status: DC
  Administered 2017-12-22 – 2017-12-28 (×6): 50 mg via ORAL
  Filled 2017-12-21 (×7): qty 1

## 2017-12-21 MED ORDER — SODIUM CHLORIDE 0.9% FLUSH
3.0000 mL | Freq: Two times a day (BID) | INTRAVENOUS | Status: DC
  Administered 2017-12-22 – 2017-12-29 (×15): 3 mL via INTRAVENOUS

## 2017-12-21 MED ORDER — ACETAMINOPHEN 325 MG PO TABS
650.0000 mg | ORAL_TABLET | Freq: Four times a day (QID) | ORAL | Status: DC | PRN
  Administered 2017-12-22: 650 mg via ORAL
  Filled 2017-12-21 (×2): qty 2

## 2017-12-21 MED ORDER — CLOPIDOGREL BISULFATE 75 MG PO TABS
75.0000 mg | ORAL_TABLET | Freq: Every day | ORAL | Status: DC
  Administered 2017-12-22 – 2017-12-28 (×3): 75 mg via ORAL
  Filled 2017-12-21 (×5): qty 1

## 2017-12-21 MED ORDER — ACETAMINOPHEN 650 MG RE SUPP: 650.0000 mg | Freq: Four times a day (QID) | RECTAL | Status: DC | PRN

## 2017-12-21 MED ORDER — PANTOPRAZOLE SODIUM 40 MG PO TBEC
40.0000 mg | DELAYED_RELEASE_TABLET | Freq: Every day | ORAL | Status: DC
  Administered 2017-12-23 – 2017-12-29 (×5): 40 mg via ORAL
  Filled 2017-12-21 (×7): qty 1

## 2017-12-21 MED ORDER — ATORVASTATIN CALCIUM 80 MG PO TABS
80.0000 mg | ORAL_TABLET | Freq: Every day | ORAL | Status: DC
  Administered 2017-12-23 – 2017-12-28 (×3): 80 mg via ORAL
  Filled 2017-12-21 (×4): qty 1
  Filled 2017-12-21: qty 2
  Filled 2017-12-21 (×2): qty 1
  Filled 2017-12-21 (×2): qty 2
  Filled 2017-12-21: qty 1
  Filled 2017-12-21: qty 2
  Filled 2017-12-21 (×2): qty 1

## 2017-12-21 MED ORDER — TRAMADOL HCL 50 MG PO TABS: 50.0000 mg | ORAL_TABLET | Freq: Two times a day (BID) | ORAL | Status: DC | PRN

## 2017-12-21 MED ORDER — ASPIRIN EC 81 MG PO TBEC
81.0000 mg | DELAYED_RELEASE_TABLET | Freq: Every day | ORAL | Status: DC
  Administered 2017-12-23 – 2017-12-29 (×5): 81 mg via ORAL
  Filled 2017-12-21 (×7): qty 1

## 2017-12-21 MED ORDER — INSULIN DETEMIR 100 UNIT/ML ~~LOC~~ SOLN
25.0000 [IU] | Freq: Every day | SUBCUTANEOUS | Status: DC
  Filled 2017-12-21: qty 0.25

## 2017-12-21 MED ORDER — DONEPEZIL HCL 10 MG PO TABS
10.0000 mg | ORAL_TABLET | Freq: Every day | ORAL | Status: DC
  Administered 2017-12-22 – 2017-12-28 (×6): 10 mg via ORAL
  Filled 2017-12-21 (×6): qty 1

## 2017-12-21 MED ORDER — GUAIFENESIN ER 600 MG PO TB12: 600.0000 mg | ORAL_TABLET | Freq: Two times a day (BID) | ORAL | Status: DC | PRN

## 2017-12-21 MED ORDER — ALBUTEROL SULFATE (2.5 MG/3ML) 0.083% IN NEBU: 2.5000 mg | INHALATION_SOLUTION | Freq: Four times a day (QID) | RESPIRATORY_TRACT | Status: DC | PRN

## 2017-12-21 MED ORDER — ONDANSETRON HCL 4 MG PO TABS: 4.0000 mg | ORAL_TABLET | Freq: Four times a day (QID) | ORAL | Status: DC | PRN

## 2017-12-21 MED ORDER — SODIUM CHLORIDE 0.9 % IV SOLN
1.0000 g | INTRAVENOUS | Status: DC
  Administered 2017-12-22 – 2017-12-23 (×2): 1 g via INTRAVENOUS
  Filled 2017-12-21 (×2): qty 1

## 2017-12-21 MED ORDER — LOSARTAN POTASSIUM 50 MG PO TABS
50.0000 mg | ORAL_TABLET | Freq: Every day | ORAL | Status: DC
  Administered 2017-12-22 – 2017-12-24 (×3): 50 mg via ORAL
  Filled 2017-12-21 (×4): qty 1

## 2017-12-21 MED ORDER — ONDANSETRON HCL 4 MG/2ML IJ SOLN: 4.0000 mg | Freq: Four times a day (QID) | INTRAMUSCULAR | Status: DC | PRN

## 2017-12-21 MED ORDER — ENOXAPARIN SODIUM 40 MG/0.4ML ~~LOC~~ SOLN
40.0000 mg | SUBCUTANEOUS | Status: DC
  Administered 2017-12-22 – 2017-12-27 (×6): 40 mg via SUBCUTANEOUS
  Filled 2017-12-21 (×6): qty 0.4

## 2017-12-21 MED ORDER — SODIUM CHLORIDE 0.9 % IV SOLN
INTRAVENOUS | Status: DC
  Administered 2017-12-22: 01:00:00 via INTRAVENOUS

## 2017-12-21 MED ORDER — BISOPROLOL FUMARATE 5 MG PO TABS
5.0000 mg | ORAL_TABLET | Freq: Every day | ORAL | Status: DC
  Administered 2017-12-22 – 2017-12-24 (×4): 5 mg via ORAL
  Filled 2017-12-21 (×5): qty 1

## 2017-12-21 NOTE — ED Provider Notes (Signed)
[facility]-EMERGENCY DEPT Provider Note   CSN: [id number] Arrival date & time: 12/21/17  1602     History   Chief Complaint Chief Complaint  Patient presents with  . Altered Mental Status    HPI William Aguirre is a 83 y.o. male.  83 yo M with a chief complaint of failure to thrive.  Per the nursing home the patient had not been eating and drinking for the past 10 days or so.  They did not endorse any infectious symptoms.  The patient is alert and able to nurse with me.  He seems somewhat confused about the scenario.  He does not remember not having anything to eat and drink for the past week.  He thinks he recently returned from a trip somewhere else.  He denies fevers or chills denies chest pain denies abdominal pain denies vomiting or diarrhea denies cough or congestion.  He denies headache.  Denies trauma.  The history is provided by the patient.  Altered Mental Status   This is a new problem. The current episode started more than 1 week ago. The problem has not changed since onset.Pertinent negatives include no confusion. His past medical history is significant for dementia.  Illness  This is a new problem. The current episode started more than 1 week ago. The problem occurs constantly. The problem has not changed since onset.Pertinent negatives include no chest pain, no abdominal pain, no headaches and no shortness of breath. Nothing aggravates the symptoms. Nothing relieves the symptoms. He has tried nothing for the symptoms. The treatment provided no relief.    Past Medical History:  Diagnosis Date  . Cervical myelopathy (HCC) 11/16/2017  . Coronary artery disease    Moderate LAD, Diagonal, Circumflex disease by cath 2004  . Dementia   . Diabetes mellitus without complication (HCC)   . Fall at home 07/06/2016  . Hyperlipidemia   . Hypertension   . Memory loss     Patient Active Problem List   Diagnosis Date Noted  . Cervical myelopathy (HCC)  11/16/2017  . TIA (transient ischemic attack) 01/07/2017  . UTI (urinary tract infection) 01/07/2017  . Stroke-like symptoms 01/06/2017  . Right knee pain 07/08/2016  . Unable to bear weight 07/08/2016  . Hypoglycemia 07/08/2016  . Upper airway cough syndrome 01/31/2015  . Dyspnea 01/31/2015  . Special screening for malignant neoplasms, colon 06/01/2013  . Mild cognitive impairment with memory loss 06/01/2013  . Unsteady gait 05/08/2013  . Hyperlipidemia 08/18/2008  . Essential hypertension 08/18/2008  . INTRAOCULAR LENS IMPLANT, HX OF 08/18/2008  . CATARACT EXTRACTION, RIGHT EYE, HX OF 08/18/2008  . Diabetes mellitus (HCC) 08/17/2008  . CORONARY ATHEROSCLEROSIS NATIVE CORONARY ARTERY 08/13/2008    Past Surgical History:  Procedure Laterality Date  . CATARACT EXTRACTION, BILATERAL          Home Medications    Prior to Admission medications   Medication Sig Start Date End Date Taking? Authorizing Provider  acetaminophen (TYLENOL) 325 MG tablet Take 2 tablets (650 mg total) by mouth every 6 (six) hours as needed for mild pain (or Fever >/= 101). 07/09/16  Yes Johnson, Clanford L, MD  aspirin (SM ASPIRIN ADULT LOW STRENGTH) 81 MG EC tablet Take 1 tablet (81 mg total) by mouth daily. Swallow whole. 01/08/17  Yes Rai, Ripudeep K, MD  atorvastatin (LIPITOR) 80 MG tablet TAKE 1 TABLET BY MOUTH ONCE DAILY Patient taking differently: TAKE 80 mg TABLET BY MOUTH ONCE DAILY 05/22/16  Yes Verne Carrow  D, MD  bisoprolol (ZEBETA) 5 MG tablet Take 1 tablet (5 mg total) by mouth 2 (two) times daily. Patient taking differently: Take 5 mg by mouth daily.  12/04/16  Yes Nafziger, Kandee Keen, NP  clopidogrel (PLAVIX) 75 MG tablet TAKE 1 TABLET BY MOUTH ONCE DAILY WITH BREAKFAST Patient taking differently: TAKE 75 mg TABLET BY MOUTH ONCE DAILY WITH BREAKFAST 01/02/16  Yes Kathleene Hazel, MD  Dextromethorphan-guaiFENesin (TUSSIN DM) 10-100 MG/5ML liquid Take 5 mLs by mouth every 12 (twelve)  hours.   Yes [provider]  donepezil (ARICEPT) 10 MG tablet Take 1 tablet (10 mg total) by mouth at bedtime. 11/16/17  Yes York Spaniel, MD  insulin aspart (NOVOLOG) 100 UNIT/ML injection Inject 3 Units into the skin 3 (three) times daily with meals. Only if cbg over 300 04/29/17  Yes Romero Belling, MD  Insulin Detemir (LEVEMIR FLEXTOUCH) 100 UNIT/ML Pen Inject 35 Units into the skin every morning. 09/14/17  Yes Romero Belling, MD  losartan (COZAAR) 50 MG tablet Take 1 tablet (50 mg total) by mouth daily. 12/04/16  Yes Nafziger, Kandee Keen, NP  pantoprazole (PROTONIX) 40 MG tablet Take 1 tablet (40 mg total) by mouth daily. 01/13/17  Yes Kathleene Hazel, MD  sertraline (ZOLOFT) 50 MG tablet Take 50 mg by mouth at bedtime.   Yes [provider]  traMADol (ULTRAM) 50 MG tablet Take 1 tablet (50 mg total) by mouth 2 (two) times daily as needed for severe pain. 01/08/17  Yes Rai, Ripudeep K, MD  cyclobenzaprine (FLEXERIL) 5 MG tablet Take 1 tablet (5 mg total) by mouth 2 (two) times daily as needed for muscle spasms. Patient not taking: Reported on 12/21/2017 04/20/17   Alvira Monday, MD  ondansetron (ZOFRAN) 4 MG tablet Take 1 tablet (4 mg total) by mouth every 8 (eight) hours as needed for nausea or vomiting. Patient not taking: Reported on 11/16/2017 01/07/17   Shirline Frees, NP    Family History Family History  Problem Relation Age of Onset  . Other Father        Deceased  . Other Mother        Deceased, 26  . Other Sister        Deceased  . Cancer Sister        Deceased  . Healthy Son        Living  . Heart disease Son        Deceased, 30 from heart valve dysfunction  . Healthy Daughter     Social History Social History   Tobacco Use  . Smoking status: Former Smoker    Years: 3.00    Last attempt to quit: 04/13/1965    Years since quitting: 52.7  . Smokeless tobacco: Never Used  Substance Use Topics  . Alcohol use: No    Alcohol/week: 0.0 standard drinks  .  Drug use: No     Allergies   Hydrocodone-acetaminophen   Review of Systems Review of Systems  Unable to perform ROS: Dementia  Constitutional: Negative for chills and fever.  HENT: Negative for congestion and facial swelling.   Eyes: Negative for discharge and visual disturbance.  Respiratory: Negative for shortness of breath.   Cardiovascular: Negative for chest pain and palpitations.  Gastrointestinal: Negative for abdominal pain, diarrhea and vomiting.  Musculoskeletal: Negative for arthralgias and myalgias.  Skin: Negative for color change and rash.  Neurological: Negative for tremors, syncope and headaches.  Psychiatric/Behavioral: Negative for confusion and dysphoric mood.  Physical Exam Updated Vital Signs BP (!) 156/85 (BP Location: Right Arm)   Pulse 76   Temp 97.8 F (36.6 C) (Oral)   Resp 20   SpO2 97%   Physical Exam  Constitutional: He is oriented to person, place, and time. He appears well-developed and well-nourished.  HENT:  Head: Normocephalic and atraumatic.  Eyes: Pupils are equal, round, and reactive to light. EOM are normal.  Neck: Normal range of motion. Neck supple. No JVD present.  Cardiovascular: Normal rate and regular rhythm. Exam reveals no gallop and no friction rub.  No murmur heard. Pulmonary/Chest: No stridor. No respiratory distress. He has no wheezes.  Abdominal: He exhibits no distension and no mass. There is no tenderness. There is no rebound and no guarding.  Musculoskeletal: Normal range of motion.  Neurological: He is alert and oriented to person, place, and time.  Skin: No rash noted. No pallor.  Psychiatric: He has a normal mood and affect. His behavior is normal.  Nursing note and vitals reviewed.    ED Treatments / Results  Labs (all labs ordered are listed, but only abnormal results are displayed) Labs Reviewed  COMPREHENSIVE METABOLIC PANEL - Abnormal; Notable for the following components:      Result Value    Glucose, Bld 134 (*)    BUN 33 (*)    Albumin 2.9 (*)    AST 48 (*)    Alkaline Phosphatase 128 (*)    GFR calc non Af Amer 58 (*)    All other components within normal limits  CBC WITH DIFFERENTIAL/PLATELET - Abnormal; Notable for the following components:   WBC 31.7 (*)    Neutro Abs 26.6 (*)    Monocytes Absolute 3.2 (*)    All other components within normal limits  URINALYSIS, ROUTINE W REFLEX MICROSCOPIC - Abnormal; Notable for the following components:   Color, Urine AMBER (*)    APPearance HAZY (*)    Hgb urine dipstick LARGE (*)    Protein, ur 30 (*)    Leukocytes, UA MODERATE (*)    Bacteria, UA FEW (*)    All other components within normal limits  I-STAT CHEM 8, ED - Abnormal; Notable for the following components:   BUN 30 (*)    Glucose, Bld 136 (*)    All other components within normal limits  CBG MONITORING, ED - Abnormal; Notable for the following components:   Glucose-Capillary 129 (*)    All other components within normal limits  CULTURE, BLOOD (ROUTINE X 2)  CULTURE, BLOOD (ROUTINE X 2)  URINE CULTURE  MRSA PCR SCREENING  AMMONIA  ETHANOL  TSH  TROPONIN I  PREALBUMIN  COMPREHENSIVE METABOLIC PANEL  CBC  I-STAT CG4 LACTIC ACID, ED    EKG None  Radiology Dg Chest 2 View  Result Date: 12/21/2017 CLINICAL DATA:  Altered mental status with confusion EXAM: CHEST - 2 VIEW COMPARISON:  December 15, 2017 FINDINGS: There is no edema or consolidation. Heart is upper normal in size with pulmonary vascularity normal. No adenopathy. There is aortic atherosclerosis. There is degenerative change in the thoracic spine and in each shoulder. IMPRESSION: No edema or consolidation. Heart size normal. There is aortic atherosclerosis. Aortic Atherosclerosis (ICD10-I70.0). Electronically Signed   By: Bretta Bang III M.D.   On: 12/21/2017 19:18   Ct Head Wo Contrast  Result Date: 12/21/2017 CLINICAL DATA:  83 year old male with altered mental status and lethargy for  the past 10 days. Subsequent encounter. EXAM: CT HEAD WITHOUT CONTRAST  TECHNIQUE: Contiguous axial images were obtained from the base of the skull through the vertex without intravenous contrast. COMPARISON:  12/15/2017 and 01/06/2017 head CT. 01/06/2017 brain MR. FINDINGS: Brain: No intracranial hemorrhage or CT evidence of large acute infarct. Chronic microvascular changes. Moderate global atrophy. No intracranial mass lesion noted on this unenhanced exam. Vascular: Vascular calcifications.  No hyperdense vessel. Skull: Negative Sinuses/Orbits: No acute orbital abnormality. Minimal mucosal thickening inferior left maxillary sinus. Other: Mastoid air cells and middle ear cavities are clear. IMPRESSION: 1. No acute intracranial abnormality. 2. Chronic microvascular changes. 3. Global atrophy. Electronically Signed   By: Lacy Duverney M.D.   On: 12/21/2017 18:25    Procedures Procedures (including critical care time)  Medications Ordered in ED Medications  aspirin EC tablet 81 mg (has no administration in time range)  bisoprolol (ZEBETA) tablet 5 mg (has no administration in time range)  losartan (COZAAR) tablet 50 mg (has no administration in time range)  atorvastatin (LIPITOR) tablet 80 mg (has no administration in time range)  traMADol (ULTRAM) tablet 50 mg (has no administration in time range)  donepezil (ARICEPT) tablet 10 mg (has no administration in time range)  sertraline (ZOLOFT) tablet 50 mg (has no administration in time range)  clopidogrel (PLAVIX) tablet 75 mg (has no administration in time range)  pantoprazole (PROTONIX) EC tablet 40 mg (has no administration in time range)  enoxaparin (LOVENOX) injection 40 mg (has no administration in time range)  sodium chloride flush (NS) 0.9 % injection 3 mL (has no administration in time range)  0.9 %  sodium chloride infusion (has no administration in time range)  acetaminophen (TYLENOL) tablet 650 mg (has no administration in time range)     Or  acetaminophen (TYLENOL) suppository 650 mg (has no administration in time range)  ondansetron (ZOFRAN) tablet 4 mg (has no administration in time range)    Or  ondansetron (ZOFRAN) injection 4 mg (has no administration in time range)  albuterol (PROVENTIL) (2.5 MG/3ML) 0.083% nebulizer solution 2.5 mg (has no administration in time range)  guaiFENesin (MUCINEX) 12 hr tablet 600 mg (has no administration in time range)  insulin aspart (novoLOG) injection 0-9 Units (has no administration in time range)  insulin detemir (LEVEMIR) injection 25 Units (has no administration in time range)  cefTRIAXone (ROCEPHIN) 1 g in sodium chloride 0.9 % 100 mL IVPB (has no administration in time range)  sodium chloride 0.9 % bolus 1,000 mL (0 mLs Intravenous Stopped 12/21/17 1934)  cefTRIAXone (ROCEPHIN) 1 g in sodium chloride 0.9 % 100 mL IVPB (1 g Intravenous New Bag/Given 12/21/17 2003)     Initial Impression / Assessment and Plan / ED Course  I have reviewed the triage vital signs and the nursing notes.  Pertinent labs & imaging results that were available during my care of the patient were reviewed by me and considered in my medical decision making (see chart for details).     83 yo M with a chief complaint of altered mental status.  Patient was seen in the ED about 5 days ago for the same.  At that time he was evaluated for infectious complaints and had no significant findings.  He was then offered admission and she declined and was discharged home.  Per the nursing home he has not been eating and drinking for the past week and a half.  Will obtain labs to screen for infection CT head reassess.  Patient has been likely urinary tract infection with moderate leukocyte esterase was 2050  whites.  His white blood cell count is 31,000. Temp 99.9 rectally. Give rocephin. Prior culture with enterococcus, sensitive to ampicillin.  Discuss with hospitalist.   The patients results and plan were reviewed and  discussed.   Any x-rays performed were independently reviewed by myself.   Differential diagnosis were considered with the presenting HPI.  Medications  aspirin EC tablet 81 mg (has no administration in time range)  bisoprolol (ZEBETA) tablet 5 mg (has no administration in time range)  losartan (COZAAR) tablet 50 mg (has no administration in time range)  atorvastatin (LIPITOR) tablet 80 mg (has no administration in time range)  traMADol (ULTRAM) tablet 50 mg (has no administration in time range)  donepezil (ARICEPT) tablet 10 mg (has no administration in time range)  sertraline (ZOLOFT) tablet 50 mg (has no administration in time range)  clopidogrel (PLAVIX) tablet 75 mg (has no administration in time range)  pantoprazole (PROTONIX) EC tablet 40 mg (has no administration in time range)  enoxaparin (LOVENOX) injection 40 mg (has no administration in time range)  sodium chloride flush (NS) 0.9 % injection 3 mL (has no administration in time range)  0.9 %  sodium chloride infusion (has no administration in time range)  acetaminophen (TYLENOL) tablet 650 mg (has no administration in time range)    Or  acetaminophen (TYLENOL) suppository 650 mg (has no administration in time range)  ondansetron (ZOFRAN) tablet 4 mg (has no administration in time range)    Or  ondansetron (ZOFRAN) injection 4 mg (has no administration in time range)  albuterol (PROVENTIL) (2.5 MG/3ML) 0.083% nebulizer solution 2.5 mg (has no administration in time range)  guaiFENesin (MUCINEX) 12 hr tablet 600 mg (has no administration in time range)  insulin aspart (novoLOG) injection 0-9 Units (has no administration in time range)  insulin detemir (LEVEMIR) injection 25 Units (has no administration in time range)  cefTRIAXone (ROCEPHIN) 1 g in sodium chloride 0.9 % 100 mL IVPB (has no administration in time range)  sodium chloride 0.9 % bolus 1,000 mL (0 mLs Intravenous Stopped 12/21/17 1934)  cefTRIAXone (ROCEPHIN) 1 g in  sodium chloride 0.9 % 100 mL IVPB (1 g Intravenous New Bag/Given 12/21/17 2003)    Vitals:   12/21/17 1930 12/21/17 1945 12/21/17 2040 12/21/17 2114  BP:    (!) 156/85  Pulse: 76 77 81 76  Resp: 15 16 16 20   Temp:    97.8 F (36.6 C)  TempSrc:    Oral  SpO2: 98% 100% 96% 97%    Final diagnoses:  Acute pyelonephritis    Admission/ observation were discussed with the admitting physician, patient and/or family and they are comfortable with the plan.    Final Clinical Impressions(s) / ED Diagnoses   Final diagnoses:  Acute pyelonephritis    ED Discharge Orders    None       Melene Plan, DO 12/21/17 2335

## 2017-12-21 NOTE — ED Triage Notes (Signed)
Pt arrived via EMS from [place] on [address]. Pt has AMS x 10 days with increased lethargy, and confusion. Pt has been refusing to eat and take medication the past 10 days. Pt baseline is A and o x 4.  EMS v/s 132/94 HR 80, RR 20, O2 sat 99% CBG 125.

## 2017-12-21 NOTE — ED Notes (Signed)
Bed: WA25 Expected date:  Expected time:  Means of arrival:  Comments: EMS-FTT 

## 2017-12-21 NOTE — H&P (Signed)
History and Physical    William Aguirre ZOX:[id number] DOB: 08-Sep-1934 DOA: 12/21/2017  Referring MD/NP/PA: Melene Plan, MD PCP: Shirline Frees, NP  Patient coming from: [facility] via EMS  Chief Complaint: Altered  I have personally briefly reviewed patient's old medical records in [facility]   HPI: William Aguirre is a 83 y.o. male with medical history significant of HTN, HLD, DM type 2, CAD, UTIs, and dementia; who presented for approximately 10 days of altered mental status.  History mostly obtained from review of records and grandaughter is present at bedside as patient is currently not at his baseline which reportedly is alert and oriented at least to person and place.  Previously his Mini-Mental status exam score was a 25, but over the last year had dropped to 18 when last checked by his neurologist.  Facility staff noted increasing lethargy and confusion which was unusual for the patient.  His last reported fall occurred approximately 9 days ago, and patient complained of right knee pain.  He is mostly wheelchair bound.  There were no report of patient sustaining any trauma to his head or loss of consciousness.  He was seen in the emergency department 7 days ago.  X-ray of the knee imaging showed an effusion without acute fracture. Patient was been seen in the emergency department the following day for being altered, but after initial work-up and fluids was feeling better and requesting discharged back to nursing facility.  Urine at that time did not show clear signs of infection.  At this time patient denies any complaints.  Granddaughter notes that he chronically has a dry cough and frequently smells of strong urine which he has had multiple urinary tract infections in the past.  Over the last 10 days he had intermittently been refusing to take his medications, eat, or be changed.  En route with EMS vital signs were noted to be stable.  Initial CBG 125.  ED Course:  Upon admission to the emergency department patient was noted to have a rectal temperature of 99.9 F.  Significant labs included WBC 31.7(increased from 14.7 on 9/4), BUN 33, creatinine 1.14, lactic acid 1.48, alkaline phosphatase 128, albumin 2.9, and AST 148.  Ammonia and alcohol level within normal limits.  Urinalysis was positive for moderate leukocytes, hemoglobin, few bacteria, white clumps present, and 21-50 WBCs.  Patient was treated empirically with Rocephin and given 1 L normal saline IV fluids.  Blood cultures were obtained and TRH called to admit for UTI and encephalopathy.  Review of Systems  Unable to perform ROS: Mental status change  Respiratory: Positive for cough.   Musculoskeletal: Positive for falls and joint pain.  Psychiatric/Behavioral: Positive for memory loss.  All other systems reviewed and are negative.   Past Medical History:  Diagnosis Date  . Cervical myelopathy (HCC) 11/16/2017  . Coronary artery disease    Moderate LAD, Diagonal, Circumflex disease by cath 2004  . Dementia   . Diabetes mellitus without complication (HCC)   . Fall at home 07/06/2016  . Hyperlipidemia   . Hypertension   . Memory loss     Past Surgical History:  Procedure Laterality Date  . CATARACT EXTRACTION, BILATERAL       reports that he quit smoking about 52 years ago. He quit after 3.00 years of use. He has never used smokeless tobacco. He reports that he does not drink alcohol or use drugs.  Allergies  Allergen Reactions  . Hydrocodone-Acetaminophen Other (See Comments)  Delusions    Family History  Problem Relation Age of Onset  . Other Father        Deceased  . Other Mother        Deceased, 28  . Other Sister        Deceased  . Cancer Sister        Deceased  . Healthy Son        Living  . Heart disease Son        Deceased, 30 from heart valve dysfunction  . Healthy Daughter     Prior to Admission medications   Medication Sig Start Date End Date Taking?  Authorizing Provider  acetaminophen (TYLENOL) 325 MG tablet Take 2 tablets (650 mg total) by mouth every 6 (six) hours as needed for mild pain (or Fever >/= 101). 07/09/16  Yes Johnson, Clanford L, MD  aspirin (SM ASPIRIN ADULT LOW STRENGTH) 81 MG EC tablet Take 1 tablet (81 mg total) by mouth daily. Swallow whole. 01/08/17  Yes Rai, Ripudeep K, MD  atorvastatin (LIPITOR) 80 MG tablet TAKE 1 TABLET BY MOUTH ONCE DAILY Patient taking differently: TAKE 80 mg TABLET BY MOUTH ONCE DAILY 05/22/16  Yes Kathleene Hazel, MD  bisoprolol (ZEBETA) 5 MG tablet Take 1 tablet (5 mg total) by mouth 2 (two) times daily. Patient taking differently: Take 5 mg by mouth daily.  12/04/16  Yes Nafziger, Kandee Keen, NP  clopidogrel (PLAVIX) 75 MG tablet TAKE 1 TABLET BY MOUTH ONCE DAILY WITH BREAKFAST Patient taking differently: TAKE 75 mg TABLET BY MOUTH ONCE DAILY WITH BREAKFAST 01/02/16  Yes Kathleene Hazel, MD  Dextromethorphan-guaiFENesin (TUSSIN DM) 10-100 MG/5ML liquid Take 5 mLs by mouth every 12 (twelve) hours.   Yes [provider]  donepezil (ARICEPT) 10 MG tablet Take 1 tablet (10 mg total) by mouth at bedtime. 11/16/17  Yes York Spaniel, MD  insulin aspart (NOVOLOG) 100 UNIT/ML injection Inject 3 Units into the skin 3 (three) times daily with meals. Only if cbg over 300 04/29/17  Yes Romero Belling, MD  Insulin Detemir (LEVEMIR FLEXTOUCH) 100 UNIT/ML Pen Inject 35 Units into the skin every morning. 09/14/17  Yes Romero Belling, MD  losartan (COZAAR) 50 MG tablet Take 1 tablet (50 mg total) by mouth daily. 12/04/16  Yes Nafziger, Kandee Keen, NP  pantoprazole (PROTONIX) 40 MG tablet Take 1 tablet (40 mg total) by mouth daily. 01/13/17  Yes Kathleene Hazel, MD  sertraline (ZOLOFT) 50 MG tablet Take 50 mg by mouth at bedtime.   Yes [provider]  traMADol (ULTRAM) 50 MG tablet Take 1 tablet (50 mg total) by mouth 2 (two) times daily as needed for severe pain. 01/08/17  Yes Rai, Ripudeep K, MD    cyclobenzaprine (FLEXERIL) 5 MG tablet Take 1 tablet (5 mg total) by mouth 2 (two) times daily as needed for muscle spasms. Patient not taking: Reported on 12/21/2017 04/20/17   Alvira Monday, MD  ondansetron (ZOFRAN) 4 MG tablet Take 1 tablet (4 mg total) by mouth every 8 (eight) hours as needed for nausea or vomiting. Patient not taking: Reported on 11/16/2017 01/07/17   Shirline Frees, NP    Physical Exam:  Constitutional: Obese elderly male who appears to be intermittently lethargic, but easily arousable and able to follow simple commands Vitals:   12/21/17 1758 12/21/17 1800 12/21/17 1930 12/21/17 1945  BP:  (!) 143/74    Pulse:   76 77  Resp:  15 15 16   Temp: 99.9 F (37.7  C)     TempSrc: Rectal     SpO2:   98% 100%   Eyes: PERRL, lids and conjunctivae normal ENMT: Mucous membranes are dry. Posterior pharynx clear of any exudate or lesions.  Neck: normal, supple, no masses, no thyromegaly Respiratory: clear to auscultation bilaterally, no wheezing, no crackles. Normal respiratory effort. No accessory muscle use.  Cardiovascular: Regular rate and rhythm, no murmurs / rubs / gallops. No extremity edema. 2+ pedal pulses. No carotid bruits.  Abdomen: no tenderness, no masses palpated. No hepatosplenomegaly. Bowel sounds positive.  Musculoskeletal: no clubbing / cyanosis. No joint deformity upper and lower extremities. Good ROM, early contractures noted of the bilateral feet.  Decreased muscle tone of the lower extremities.  Skin: no rashes, lesions, ulcers. No induration Neurologic: CN 2-12 grossly intact. Sensation intact, DTR normal. Strength 4+/5 in all 4.  Psychiatric: Alert and oriented to person only.  Normal mood.    Labs on Admission: I have personally reviewed following labs and imaging studies  CBC: Recent Labs  Lab 12/15/17 1419 12/21/17 1727 12/21/17 1757  WBC 14.7*  --  31.7*  NEUTROABS 11.4*  --  PENDING  HGB 13.8 13.9 13.6  HCT 43.9 41.0 41.1  MCV 85.7   --  84.0  PLT 177  --  247   Basic Metabolic Panel: Recent Labs  Lab 12/15/17 1419 12/21/17 1727 12/21/17 1757  NA 140 141 142  K 4.3 4.1 4.1  CL 103 108 105  CO2 27  --  25  GLUCOSE 170* 136* 134*  BUN 11 30* 33*  CREATININE 1.11 1.10 1.14  CALCIUM 9.4  --  9.2   GFR: Estimated Creatinine Clearance: 60.5 mL/min (by C-G formula based on SCr of 1.14 mg/dL). Liver Function Tests: Recent Labs  Lab 12/15/17 1419 12/21/17 1757  AST 32 48*  ALT 21 39  ALKPHOS 114 128*  BILITOT 1.3* 0.8  PROT 7.9 7.9  ALBUMIN 3.4* 2.9*   No results for input(s): LIPASE, AMYLASE in the last 168 hours. Recent Labs  Lab 12/21/17 1757  AMMONIA 9   Coagulation Profile: Recent Labs  Lab 12/15/17 1419  INR 1.31   Cardiac Enzymes: No results for input(s): CKTOTAL, CKMB, CKMBINDEX, TROPONINI in the last 168 hours. BNP (last 3 results) No results for input(s): PROBNP in the last 8760 hours. HbA1C: No results for input(s): HGBA1C in the last 72 hours. CBG: Recent Labs  Lab 12/21/17 1713  GLUCAP 129*   Lipid Profile: No results for input(s): CHOL, HDL, LDLCALC, TRIG, CHOLHDL, LDLDIRECT in the last 72 hours. Thyroid Function Tests: No results for input(s): TSH, T4TOTAL, FREET4, T3FREE, THYROIDAB in the last 72 hours. Anemia Panel: No results for input(s): VITAMINB12, FOLATE, FERRITIN, TIBC, IRON, RETICCTPCT in the last 72 hours. Urine analysis:    Component Value Date/Time   COLORURINE AMBER (A) 12/21/2017 1757   APPEARANCEUR HAZY (A) 12/21/2017 1757   LABSPEC 1.018 12/21/2017 1757   PHURINE 5.0 12/21/2017 1757   GLUCOSEU NEGATIVE 12/21/2017 1757   HGBUR LARGE (A) 12/21/2017 1757   BILIRUBINUR NEGATIVE 12/21/2017 1757   BILIRUBINUR n 03/26/2015 1025   KETONESUR NEGATIVE 12/21/2017 1757   PROTEINUR 30 (A) 12/21/2017 1757   UROBILINOGEN 0.2 03/26/2015 1025   UROBILINOGEN 0.2 05/29/2013 0216   NITRITE NEGATIVE 12/21/2017 1757   LEUKOCYTESUR MODERATE (A) 12/21/2017 1757    Sepsis Labs: No results found for this or any previous visit (from the past 240 hour(s)).   Radiological Exams on Admission: Dg Chest 2 View  Result Date: 12/21/2017 CLINICAL DATA:  Altered mental status with confusion EXAM: CHEST - 2 VIEW COMPARISON:  December 15, 2017 FINDINGS: There is no edema or consolidation. Heart is upper normal in size with pulmonary vascularity normal. No adenopathy. There is aortic atherosclerosis. There is degenerative change in the thoracic spine and in each shoulder. IMPRESSION: No edema or consolidation. Heart size normal. There is aortic atherosclerosis. Aortic Atherosclerosis (ICD10-I70.0). Electronically Signed   By: Bretta Bang III M.D.   On: 12/21/2017 19:18   Ct Head Wo Contrast  Result Date: 12/21/2017 CLINICAL DATA:  83 year old male with altered mental status and lethargy for the past 10 days. Subsequent encounter. EXAM: CT HEAD WITHOUT CONTRAST TECHNIQUE: Contiguous axial images were obtained from the base of the skull through the vertex without intravenous contrast. COMPARISON:  12/15/2017 and 01/06/2017 head CT. 01/06/2017 brain MR. FINDINGS: Brain: No intracranial hemorrhage or CT evidence of large acute infarct. Chronic microvascular changes. Moderate global atrophy. No intracranial mass lesion noted on this unenhanced exam. Vascular: Vascular calcifications.  No hyperdense vessel. Skull: Negative Sinuses/Orbits: No acute orbital abnormality. Minimal mucosal thickening inferior left maxillary sinus. Other: Mastoid air cells and middle ear cavities are clear. IMPRESSION: 1. No acute intracranial abnormality. 2. Chronic microvascular changes. 3. Global atrophy. Electronically Signed   By: Lacy Duverney M.D.   On: 12/21/2017 18:25    EKG: Independently reviewed.  Normal sinus rhythm at 83 bpm with right axis deviation  Assessment/Plan Urinary tract infection with hematuria: Acute.  Patient found to have urinalysis concerning for infection.   Previous history of enterococcus faecalis that was sensitive to ampicillin, nitrofurantoin, and vancomycin.  Patient was treated with empiric antibiotics of Rocephin. - Admit to a telemetry bed - Follow-up urine and blood cultures - Continue empiric antibiotics of Rocephin  Acute metabolic encephalopathy: Acute.  Presents acutely altered with decreased p.o. intake.  CT imaging of the brain showed no acute abnormalities.  UA concerning for possible signs of infection and elevated BUN to creatinine ratio suggest some dehydration. - Neurochecks - Check TSH and troponin - Consider need of further imaging of brain  Dehydration: Acute.  Patient presents with elevated BUN to creatinine ratio suggest dehydration along with reports of decreased p.o. Intake. - Normal saline IV fluids at 75 mL/h overnight  Dementia - Continue Aricept  Diabetes mellitus type 2: Patient presents with initial glucose of 134 admission. Last hemoglobin A1c 8.7 on 08/27/2017.  Patient is followed in the outpatient setting by Dr. Romero Belling. - Hypoglycemic protocol - Reduced home Levemir dose from 35 down to 25 units q. a.m. - CBGs q. before meals with sensitive SSI  Transaminitis: Acute: Patient found to have elevations of alkaline phosphatase 128 and AST 48.  Levels previously noted within normal limits.  - Continue to monitor  Malnutrition: Acute.  Initial albumin 2.9 on admission.  Patient with decreased p.o. intake over the last 2 days. - Check prealbumin in a.m.  Essential hypertension - Continue bisoprolol and losartan  CAD - Continue aspirin, Plavix, and statin  Depression - Continue Zoloft  Hyperlipidemia - Continue atorvastatin  DVT prophylaxis: Lovenox Code Status: Full Family Communication: Discussed plan of care with the patient and family present at bedside Disposition Plan: Likely discharge back to [facility] once medically stable Consults called: None Admission status: observation  Clydie Braun MD [facility] Pager [phone]   If 7PM-7AM, please contact night-coverage [email or web address] Password [facility]  12/21/2017, 7:24 PM

## 2017-12-22 ENCOUNTER — Other Ambulatory Visit: Payer: Self-pay

## 2017-12-22 ENCOUNTER — Observation Stay (HOSPITAL_COMMUNITY): Payer: Medicare HMO

## 2017-12-22 DIAGNOSIS — S7002XA Contusion of left hip, initial encounter: Secondary | ICD-10-CM | POA: Diagnosis present

## 2017-12-22 DIAGNOSIS — R627 Adult failure to thrive: Secondary | ICD-10-CM | POA: Diagnosis present

## 2017-12-22 DIAGNOSIS — F329 Major depressive disorder, single episode, unspecified: Secondary | ICD-10-CM | POA: Diagnosis present

## 2017-12-22 DIAGNOSIS — E46 Unspecified protein-calorie malnutrition: Secondary | ICD-10-CM | POA: Diagnosis present

## 2017-12-22 DIAGNOSIS — Z66 Do not resuscitate: Secondary | ICD-10-CM | POA: Diagnosis present

## 2017-12-22 DIAGNOSIS — Z7982 Long term (current) use of aspirin: Secondary | ICD-10-CM | POA: Diagnosis not present

## 2017-12-22 DIAGNOSIS — F0390 Unspecified dementia without behavioral disturbance: Secondary | ICD-10-CM | POA: Diagnosis present

## 2017-12-22 DIAGNOSIS — G9341 Metabolic encephalopathy: Secondary | ICD-10-CM | POA: Diagnosis present

## 2017-12-22 DIAGNOSIS — E860 Dehydration: Secondary | ICD-10-CM | POA: Diagnosis present

## 2017-12-22 DIAGNOSIS — R319 Hematuria, unspecified: Secondary | ICD-10-CM | POA: Diagnosis present

## 2017-12-22 DIAGNOSIS — R740 Nonspecific elevation of levels of transaminase and lactic acid dehydrogenase [LDH]: Secondary | ICD-10-CM | POA: Diagnosis present

## 2017-12-22 DIAGNOSIS — E119 Type 2 diabetes mellitus without complications: Secondary | ICD-10-CM | POA: Diagnosis present

## 2017-12-22 DIAGNOSIS — N3000 Acute cystitis without hematuria: Secondary | ICD-10-CM | POA: Diagnosis not present

## 2017-12-22 DIAGNOSIS — Y92129 Unspecified place in nursing home as the place of occurrence of the external cause: Secondary | ICD-10-CM | POA: Diagnosis not present

## 2017-12-22 DIAGNOSIS — W19XXXA Unspecified fall, initial encounter: Secondary | ICD-10-CM | POA: Diagnosis present

## 2017-12-22 DIAGNOSIS — M5000 Cervical disc disorder with myelopathy, unspecified cervical region: Secondary | ICD-10-CM | POA: Diagnosis present

## 2017-12-22 DIAGNOSIS — M25461 Effusion, right knee: Secondary | ICD-10-CM | POA: Diagnosis present

## 2017-12-22 DIAGNOSIS — B964 Proteus (mirabilis) (morganii) as the cause of diseases classified elsewhere: Secondary | ICD-10-CM | POA: Diagnosis present

## 2017-12-22 DIAGNOSIS — R413 Other amnesia: Secondary | ICD-10-CM | POA: Diagnosis present

## 2017-12-22 DIAGNOSIS — N10 Acute tubulo-interstitial nephritis: Secondary | ICD-10-CM | POA: Diagnosis present

## 2017-12-22 DIAGNOSIS — M25551 Pain in right hip: Secondary | ICD-10-CM | POA: Diagnosis not present

## 2017-12-22 DIAGNOSIS — R402253 Coma scale, best verbal response, oriented, at hospital admission: Secondary | ICD-10-CM | POA: Diagnosis present

## 2017-12-22 DIAGNOSIS — E1142 Type 2 diabetes mellitus with diabetic polyneuropathy: Secondary | ICD-10-CM | POA: Diagnosis not present

## 2017-12-22 DIAGNOSIS — E785 Hyperlipidemia, unspecified: Secondary | ICD-10-CM | POA: Diagnosis present

## 2017-12-22 DIAGNOSIS — Z515 Encounter for palliative care: Secondary | ICD-10-CM | POA: Diagnosis present

## 2017-12-22 DIAGNOSIS — Z23 Encounter for immunization: Secondary | ICD-10-CM | POA: Diagnosis present

## 2017-12-22 DIAGNOSIS — Z7189 Other specified counseling: Secondary | ICD-10-CM | POA: Diagnosis not present

## 2017-12-22 DIAGNOSIS — M25552 Pain in left hip: Secondary | ICD-10-CM | POA: Diagnosis not present

## 2017-12-22 DIAGNOSIS — I10 Essential (primary) hypertension: Secondary | ICD-10-CM | POA: Diagnosis present

## 2017-12-22 DIAGNOSIS — R402363 Coma scale, best motor response, obeys commands, at hospital admission: Secondary | ICD-10-CM | POA: Diagnosis present

## 2017-12-22 DIAGNOSIS — I2510 Atherosclerotic heart disease of native coronary artery without angina pectoris: Secondary | ICD-10-CM | POA: Diagnosis present

## 2017-12-22 LAB — CBC
HEMATOCRIT: 40.9 % (ref 39.0–52.0)
HEMOGLOBIN: 13.6 g/dL (ref 13.0–17.0)
MCH: 27.8 pg (ref 26.0–34.0)
MCHC: 33.3 g/dL (ref 30.0–36.0)
MCV: 83.6 fL (ref 78.0–100.0)
Platelets: 228 10*3/uL (ref 150–400)
RBC: 4.89 MIL/uL (ref 4.22–5.81)
RDW: 14.1 % (ref 11.5–15.5)
WBC: 26.1 10*3/uL — AB (ref 4.0–10.5)

## 2017-12-22 LAB — COMPREHENSIVE METABOLIC PANEL
ALBUMIN: 2.6 g/dL — AB (ref 3.5–5.0)
ALT: 38 U/L (ref 0–44)
ANION GAP: 10 (ref 5–15)
AST: 47 U/L — ABNORMAL HIGH (ref 15–41)
Alkaline Phosphatase: 106 U/L (ref 38–126)
BILIRUBIN TOTAL: 0.9 mg/dL (ref 0.3–1.2)
BUN: 30 mg/dL — ABNORMAL HIGH (ref 8–23)
CHLORIDE: 110 mmol/L (ref 98–111)
CO2: 25 mmol/L (ref 22–32)
Calcium: 9 mg/dL (ref 8.9–10.3)
Creatinine, Ser: 1 mg/dL (ref 0.61–1.24)
GFR calc Af Amer: >60 mL/min (ref >60)
GLUCOSE: 101 mg/dL — AB (ref 70–99)
POTASSIUM: 4.1 mmol/L (ref 3.5–5.1)
Sodium: 145 mmol/L (ref 135–145)
TOTAL PROTEIN: 7 g/dL (ref 6.5–8.1)

## 2017-12-22 LAB — GLUCOSE, CAPILLARY
GLUCOSE-CAPILLARY: 106 mg/dL — AB (ref 70–99)
GLUCOSE-CAPILLARY: 112 mg/dL — AB (ref 70–99)
GLUCOSE-CAPILLARY: 128 mg/dL — AB (ref 70–99)
GLUCOSE-CAPILLARY: 157 mg/dL — AB (ref 70–99)
Glucose-Capillary: 70 mg/dL (ref 70–99)
Glucose-Capillary: 181 mg/dL — ABNORMAL HIGH (ref 70–99)

## 2017-12-22 LAB — PREALBUMIN: PREALBUMIN: 5.3 mg/dL — AB (ref 18–38)

## 2017-12-22 LAB — MRSA PCR SCREENING: MRSA by PCR: NEGATIVE

## 2017-12-22 MED ORDER — SODIUM CHLORIDE 0.9 % IV SOLN
INTRAVENOUS | Status: DC
  Administered 2017-12-22 – 2017-12-24 (×4): via INTRAVENOUS

## 2017-12-22 MED ORDER — INFLUENZA VAC SPLIT HIGH-DOSE 0.5 ML IM SUSY
0.5000 mL | PREFILLED_SYRINGE | INTRAMUSCULAR | Status: AC
  Administered 2017-12-23: 0.5 mL via INTRAMUSCULAR
  Filled 2017-12-22: qty 0.5

## 2017-12-22 NOTE — Progress Notes (Signed)
Patient does not follow commands very well. Patient refused oral medications.  IV fluids are infusing. Patient has a condom catheter in place.

## 2017-12-22 NOTE — Clinical Social Work Note (Signed)
Clinical Social Work Assessment  Patient Details  Name: William Aguirre MRN: [id number] Date of Birth: July 03, 1934  Date of referral:  12/22/17               Reason for consult:  Discharge Planning                Permission sought to share information with:    Permission granted to share information::     Name::        Agency::     Relationship::     Contact Information:     Housing/Transportation Living arrangements for the past 2 months:  Assisted Living Facility([facility]) Source of Information:  Adult Children, Facility([facility] staff;; Loney Loh) Patient Interpreter Needed:  None Criminal Activity/Legal Involvement Pertinent to Current Situation/Hospitalization:  No - Comment as needed Significant Relationships:  Adult Children Lives with:  Facility Resident Do you feel safe going back to the place where you live?    Need for family participation in patient care:  Yes (Comment)  Care giving concerns:  Patient admitted from [facility]. [facility] staff member Tammy reported that patient was independent with ambulation and most ADLs up until 2 weeks ago. Staff reported that patient got sick and received bad news from family and stopped doing everything. CSW consulted to assist with discharge planning.    Social Worker assessment / plan:  CSW spoke with patient's daughter Loney Loh [phone]) regarding discharge planning, patient only oriented to self and unable to participate in assessment. Patient's daughter reported that patient has a history of depression and has been upset since she [place] out of state. Patient's daughter reported that since she moved out of state patient has been refusing to eat or do anything. Patient's daughter reported that patient made a comment to one of her clients that he rather die than to live like this, CSW asked patient's daughter what did patient mean by that comment. Patient's daughter  reported that patient meant he didn't want to live in a facility without many visitors and reported that she was unable to visit patient daily. Patient's daughter reported that she feels the patient doesn't want to live anymore, patient's daughter reported she is agreeable to a palliative consult. Patient's daughter reported that if patient continues to refuse to do anything she feels he will need a skilled level of care. Patient's daughter reported that patient is currently private paying for ALF and that she completed a medicaid application for patient on 12/21/2017. Patient's daughter reported that she is open to having a goals of care discussion with palliative.  CSW updated patient's attending MD. MD Agreed to palliative consult. Disposition for patient is unclear at this time.  CSW will continue to follow and assist with discharge planning.  Employment status:  Retired Database administrator PT Recommendations:  Not assessed at this time Information / Referral to community resources:  Other (Comment Required)(Patient admitted from [facility])  Patient/Family's Response to care:  Patient's daughter appreciative of CSW assistance with discharge planning.  Patient/Family's Understanding of and Emotional Response to Diagnosis, Current Treatment, and Prognosis:  Patient's daughter involved in patient's care and expressed uncertainty about patient's discharge planning. Patient's daughter informed CSW about patient's depression and feelings surrounding his current living situation. Patient's daughter expressed that she is unable to meet patient's wishes of seeing him daily and that he does not want to live in a facility, CSW provided emotional support. Patient's daughter agreeable  to meeting with palliative to discuss goals of care for patient.   Emotional Assessment Appearance:    Attitude/Demeanor/Rapport:  Unable to Assess Affect (typically observed):  Unable to  Assess Orientation:  Oriented to Self Alcohol / Substance use:  Not Applicable Psych involvement (Current and /or in the community):  No (Comment)  Discharge Needs  Concerns to be addressed:  Care Coordination Readmission within the last 30 days:  No Current discharge risk:  Cognitively Impaired Barriers to Discharge:  Continued Medical Work up   [employer], LCSW 12/22/2017, 3:33 PM

## 2017-12-22 NOTE — Progress Notes (Addendum)
PROGRESS NOTE    William Aguirre  ZOX:[id number] DOB: 09/09/1934 DOA: 12/21/2017 PCP: Shirline Frees, NP   Brief Narrative: Patient is a 83 year old male with medical history significant of HTN, HLD, DM type 2, CAD, UTIs, and dementia; who presented for approximately 10 days of altered mental status.  History mostly obtained from review of records and grandaughter  at bedside .  It was reported that patient fell at the nursing facility.  Patient has a bruise on his left hip.  Patient was found to have leukocytosis, mild fever on presentation.  UA was suggestive of urinary tract infection.  Admitted for UTI and encephalopathy.  Assessment & Plan:   Principal Problem:   UTI (urinary tract infection) Active Problems:   Diabetes mellitus (HCC)   Essential hypertension   CORONARY ATHEROSCLEROSIS NATIVE CORONARY ARTERY   Acute metabolic encephalopathy   Dehydration   Malnutrition (HCC)   Urinary tract infection with hematuria:  Patient found to have urinalysis concerning for infection.  Previous history of enterococcus faecalis that was sensitive to ampicillin, nitrofurantoin, and vancomycin.  Patient was treated with empiric antibiotics of Rocephin. Continue Rocephin for now.  Follow-up urine culture and blood culture. Patient also has significant leukocytosis.  He is currently afebrile.  Acute metabolic encephalopathy:  Presents  altered with decreased p.o. intake.  CT imaging of the brain showed no acute abnormalities but global atrophy.  Patient has history of underlying dementia.His mental status seems to have improved this morning.  He is more communicative and alert and awake.  Dehydration: Normal saline IV fluids at 75 mL/h   Dementia:Continue Aricept  Diabetes mellitus type 2:Last hemoglobin A1c 8.7 on 08/27/2017.  Patient is followed in the outpatient setting by Dr. Romero Belling. Patient's blood sugar dropped to 70 this morning.  Levemir discontinued.  We will continue sliding  scale insulin only.  Malnutrition:   Initial albumin 2.9 on admission.  Patient with decreased p.o. intake over the last 2 days.Low prealbumin .  Requested for nutrition consult.  Essential hypertension: Continue bisoprolol and losartan  CAD: Continue aspirin, Plavix, and statin  Depression: Continue Zoloft  Hyperlipidemia Continue atorvastatin  Fall: Reported to have fallen at [facility].  Complains of pain on the left hip.  He has bruise on the left hip.  Imaging did not show any fracture dislocation.  Continue supportive care, pain management.  Patient is a nursing home resident.  We have requested for palliative care evaluation due to his multiple comorbidities, advanced age, dementia and decreased oral intake .Family members reportedly interested on hospice.   DVT prophylaxis: Lovenox Code Status: Full Family Communication: None present at the bed side Disposition: Undetermined at this point  Antimicrobials:Rocephin  Subjective: Patient seen and examined the bedside this morning.  His mental status seems to have improved.  He is more alert, awake and communicative this morning.  Complains of bruise /pain on the left hip.  Objective: Vitals:   12/21/17 2040 12/21/17 2114 12/22/17 0101 12/22/17 0429  BP:  (!) 156/85  (!) 144/76  Pulse: 81 76  75  Resp: 16 20  18   Temp:  97.8 F (36.6 C)  99 F (37.2 C)  TempSrc:  Oral  Oral  SpO2: 96% 97%  97%  Weight:   83.6 kg   Height:   5\' 11"  (1.803 m)     Intake/Output Summary (Last 24 hours) at 12/22/2017 1313 Last data filed at 12/22/2017 1101 Gross per 24 hour  Intake 1474.69 ml  Output 477 ml  Net 997.69 ml   Filed Weights   12/22/17 0101  Weight: 83.6 kg    Examination:  General exam: Appears calm and comfortable ,Not in distress,obese HEENT:PERRL,Oral mucosa moist, Ear/Nose normal on gross exam Respiratory system: Bilateral equal air entry, normal vesicular breath sounds, no wheezes or crackles    Cardiovascular system: S1 & S2 heard, RRR. No JVD, murmurs, rubs, gallops or clicks. No pedal edema. Gastrointestinal system: Abdomen is nondistended, soft and nontender. No organomegaly or masses felt. Normal bowel sounds heard. Central nervous system: Alert and awake but not oriented. No focal neurological deficits. Extremities: No edema, no clubbing ,no cyanosis, distal peripheral pulses palpable.  Tenderness and bruises on the left hip Skin: No lesions or ulcers,no icterus ,no pallor   Data Reviewed: I have personally reviewed following labs and imaging studies  CBC: Recent Labs  Lab 12/15/17 1419 12/21/17 1727 12/21/17 1757 12/22/17 0402  WBC 14.7*  --  31.7* 26.1*  NEUTROABS 11.4*  --  26.6*  --   HGB 13.8 13.9 13.6 13.6  HCT 43.9 41.0 41.1 40.9  MCV 85.7  --  84.0 83.6  PLT 177  --  247 228   Basic Metabolic Panel: Recent Labs  Lab 12/15/17 1419 12/21/17 1727 12/21/17 1757 12/22/17 0402  NA 140 141 142 145  K 4.3 4.1 4.1 4.1  CL 103 108 105 110  CO2 27  --  25 25  GLUCOSE 170* 136* 134* 101*  BUN 11 30* 33* 30*  CREATININE 1.11 1.10 1.14 1.00  CALCIUM 9.4  --  9.2 9.0   GFR: Estimated Creatinine Clearance: 61.7 mL/min (by C-G formula based on SCr of 1 mg/dL). Liver Function Tests: Recent Labs  Lab 12/15/17 1419 12/21/17 1757 12/22/17 0402  AST 32 48* 47*  ALT 21 39 38  ALKPHOS 114 128* 106  BILITOT 1.3* 0.8 0.9  PROT 7.9 7.9 7.0  ALBUMIN 3.4* 2.9* 2.6*   No results for input(s): LIPASE, AMYLASE in the last 168 hours. Recent Labs  Lab 12/21/17 1757  AMMONIA 9   Coagulation Profile: Recent Labs  Lab 12/15/17 1419  INR 1.31   Cardiac Enzymes: Recent Labs  Lab 12/21/17 1757  TROPONINI <0.03   BNP (last 3 results) No results for input(s): PROBNP in the last 8760 hours. HbA1C: No results for input(s): HGBA1C in the last 72 hours. CBG: Recent Labs  Lab 12/21/17 1713 12/22/17 0022 12/22/17 0726 12/22/17 0825 12/22/17 1202  GLUCAP  129* 106* 70 112* 128*   Lipid Profile: No results for input(s): CHOL, HDL, LDLCALC, TRIG, CHOLHDL, LDLDIRECT in the last 72 hours. Thyroid Function Tests: Recent Labs    12/21/17 2213  TSH 1.348   Anemia Panel: No results for input(s): VITAMINB12, FOLATE, FERRITIN, TIBC, IRON, RETICCTPCT in the last 72 hours. Sepsis Labs: Recent Labs  Lab 12/21/17 1728  LATICACIDVEN 1.48    Recent Results (from the past 240 hour(s))  Blood Cultures (routine x 2)     Status: None (Preliminary result)   Collection Time: 12/21/17  5:28 PM  Result Value Ref Range Status   Specimen Description   Final    BLOOD LEFT ARM UPPER Performed at [facility], [address], [place], [place] [address]    Special Requests   Final    BOTTLES DRAWN AEROBIC AND ANAEROBIC Blood Culture adequate volume Performed at [facility], [address] [place], [place] [address]    Culture   Final    NO GROWTH <  12 HOURS Performed at [facility], [address], [place], [place] [address]    Report Status PENDING  Incomplete  Blood Cultures (routine x 2)     Status: None (Preliminary result)   Collection Time: 12/21/17  7:46 PM  Result Value Ref Range Status   Specimen Description   Final    BLOOD RIGHT HAND Performed at [facility], [address], [place], [place] [address]    Special Requests   Final    BOTTLES DRAWN AEROBIC ONLY Blood Culture results may not be optimal due to an inadequate volume of blood received in culture bottles Performed at [facility], [address], [place], [place] [address]    Culture   Final    NO GROWTH < 12 HOURS Performed at [facility], [address], [place], [place] [address]    Report Status PENDING  Incomplete  MRSA PCR Screening     Status: None   Collection Time: 12/21/17 10:49 PM  Result Value Ref Range Status   MRSA by PCR NEGATIVE NEGATIVE Final    Comment:          The GeneXpert MRSA Assay (FDA approved for NASAL specimens only), is one component of a comprehensive MRSA colonization surveillance program. It is not intended to diagnose MRSA infection nor to guide or monitor treatment for MRSA infections. Performed at [facility], [address] [place], [place] [address]          Radiology Studies: Dg Chest 2 View  Result Date: 12/21/2017 CLINICAL DATA:  Altered mental status with confusion EXAM: CHEST - 2 VIEW COMPARISON:  December 15, 2017 FINDINGS: There is no edema or consolidation. Heart is upper normal in size with pulmonary vascularity normal. No adenopathy. There is aortic atherosclerosis. There is degenerative change in the thoracic spine and in each shoulder. IMPRESSION: No edema or consolidation. Heart size normal. There is aortic atherosclerosis. Aortic Atherosclerosis (ICD10-I70.0). Electronically Signed   By: Bretta Bang III M.D.   On: 12/21/2017 19:18   Ct Head Wo Contrast  Result Date: 12/21/2017 CLINICAL DATA:  83 year old male with altered mental status and lethargy for the past 10 days. Subsequent encounter. EXAM: CT HEAD WITHOUT CONTRAST TECHNIQUE: Contiguous axial images were obtained from the base of the skull through the vertex without intravenous contrast. COMPARISON:  12/15/2017 and 01/06/2017 head CT. 01/06/2017 brain MR. FINDINGS: Brain: No intracranial hemorrhage or CT evidence of large acute infarct. Chronic microvascular changes. Moderate global atrophy. No intracranial mass lesion noted on this unenhanced exam. Vascular: Vascular calcifications.  No hyperdense vessel. Skull: Negative Sinuses/Orbits: No acute orbital abnormality. Minimal mucosal thickening inferior left maxillary sinus. Other: Mastoid air cells and middle ear cavities are clear. IMPRESSION: 1. No acute intracranial abnormality. 2. Chronic microvascular changes. 3. Global atrophy. Electronically Signed   By: Lacy Duverney M.D.    On: 12/21/2017 18:25   Dg Hips Bilat With Pelvis 3-4 Views  Result Date: 12/22/2017 CLINICAL DATA:  Bilateral hip pain.  Unclear injury. EXAM: DG HIP (WITH OR WITHOUT PELVIS) 3-4V BILAT COMPARISON:  Pelvis x-ray dated July 08, 2016. FINDINGS: There is no evidence of hip fracture or dislocation. There is no evidence of arthropathy or other focal bone abnormality. Degenerative changes of the lower lumbar spine. Osteopenia. Soft tissues are unremarkable. Vascular calcifications. IMPRESSION: No acute osseous abnormality. Electronically Signed   By: Obie Dredge M.D.   On: 12/22/2017 11:29  Scheduled Meds: . aspirin EC  81 mg Oral Daily  . atorvastatin  80 mg Oral q1800  . bisoprolol  5 mg Oral Daily  . clopidogrel  75 mg Oral Q supper  . donepezil  10 mg Oral QHS  . enoxaparin (LOVENOX) injection  40 mg Subcutaneous Q24H  . insulin aspart  0-9 Units Subcutaneous TID WC  . losartan  50 mg Oral Daily  . pantoprazole  40 mg Oral Daily  . sertraline  50 mg Oral QHS  . sodium chloride flush  3 mL Intravenous Q12H   Continuous Infusions: . cefTRIAXone (ROCEPHIN)  IV       LOS: 0 days    Time spent: 25 mins.More than 50% of that time was spent in counseling and/or coordination of care.      Burnadette Pop, MD [facility] Pager [phone]  If 7PM-7AM, please contact night-coverage [email or web address] Password [email or web address] 12/22/2017, 1:13 PM

## 2017-12-22 NOTE — Care Management Note (Signed)
Case Management Note  Patient Details  Name: William Aguirre MRN: [id number] Date of Birth: 12-20-1934  Subjective/Objective:      Pt admitted UTI              Action/Plan: Plan to discharge back to [facility]   Expected Discharge Date:                  Expected Discharge Plan:  Assisted Living / Rest Home  In-House Referral:  Clinical Social Work  Discharge planning Services  CM Consult  Post Acute Care Choice:    Choice offered to:     DME Arranged:    DME Agency:     HH Arranged:    HH Agency:     Status of Service:  Completed, signed off  If discussed at [employer], dates discussed:    Additional CommentsGeni Bers, RN 12/22/2017, 2:25 PM

## 2017-12-22 NOTE — Consult Note (Signed)
Consultation Note Date: 12/22/2017   Patient Name: William Aguirre  DOB: 03/14/1935  MRN: [id number]  Age / Sex: 83 y.o., male  PCP: Dorothyann Peng, NP Referring Physician: Shelly Coss, MD  Reason for Consultation: Establishing goals of care and Hospice Evaluation  HPI/Patient Profile: 83 y.o. male  with past medical history of HTN, HLD, DM type 2, CAD, UTIs, and dementia admitted from [facility] on 12/21/2017 with worsening altered mental status and poor intake progressing since fall ~10 days ago. Per report he is much changed from baseline where he participates in his ADLs, mostly wheelchair bound, and converses with staff and visitors. Being treated for UTI and dehydration with no fractures or injury evident from recent fall.  Clinical Assessment and Goals of Care: I met today at William Aguirre bedside. No family/visitors present. I have made note of conversations documented by CSW. RN reports that patient has refused meds and intake other than fluids. He did drink 3 juices when his blood sugar was low.   William Aguirre was fully awake and alert and conversational with me this evening. He is disoriented to place and time (did not know month or year) but able to hold a decent conversation. I helped him eat an orange sherbert during our conversation which he accepted and requested cold fluids which he drank well. He does need assistance with eating/drinking and struggles to bring cup to mouth (although he did try). He talks of being at [facility] for ~4-5 months and that "it is alright but needs some work done." He says the food is overall fairly good there. I asked him about any issues or concerns he has been having lately and he could not think of anything. He tells me that he is feeling better today but complains of right knee pain and recalls his fall and gives details of how and where this occurred. I  spoke with him about eating better and he agrees to eat dinner and says that he is hungry. I reviewed menu and called in his dinner requests on his behalf. I also turned the television on for him and he tells me that he enjoys watching golf. Emotional support provided. Discussed my conversation with RN. I will reach out to his daughter tomorrow to discuss [place] further but will await to follow his progress overnight.   Primary Decision Maker NEXT OF KIN children    SUMMARY OF RECOMMENDATIONS   - Will monitor intake and progress and will reach out to daughter for [place] conversation tomorrow  Code Status/Advance Care Planning:  Full code - need to discuss with children   Symptom Management:   Pain right knee since fall (xray negative for fracture): Tylenol prn.   Palliative Prophylaxis:   Aspiration, Bowel Regimen, Delirium Protocol, Oral Care and Turn Reposition  Psycho-social/Spiritual:   Desire for further Chaplaincy support:yes  Additional Recommendations: Caregiving  Support/Resources and Education on Hospice  Prognosis:   Unable to determine. Will monitor and speak more with family.   Discharge Planning: To Be  Determined      Primary Diagnoses: Present on Admission: . UTI (urinary tract infection) . Acute metabolic encephalopathy . Dehydration . CORONARY ATHEROSCLEROSIS NATIVE CORONARY ARTERY . Essential hypertension . Malnutrition ([place])   I have reviewed the medical record, interviewed the patient and family, and examined the patient. The following aspects are pertinent.  Past Medical History:  Diagnosis Date  . Cervical myelopathy ([place]) 11/16/2017  . Coronary artery disease    Moderate LAD, Diagonal, Circumflex disease by cath 2004  . Dementia   . Diabetes mellitus without complication ([place])   . Fall at home 07/06/2016  . Hyperlipidemia   . Hypertension   . Memory loss    Social History   Socioeconomic History  . Marital status: Married    Spouse name:  Not on file  . Number of children: 4  . Years of education: Engineer, maintenance (IT)  . Highest education level: Not on file  Occupational History  . 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 Financial resource strain: Not on file  . Food insecurity:    Worry: Not on file    Inability: Not on file  . Transportation needs:    Medical: Not on file    Non-medical: Not on file  Tobacco Use  . Smoking status: Former Smoker    Years: 3.00    Last attempt to quit: 04/13/1965    Years since quitting: 52.7  . Smokeless tobacco: Never Used  Substance and Sexual Activity  . Alcohol use: No    Alcohol/week: 0.0 standard drinks  . Drug use: No  . Sexual activity: Never  Lifestyle  . Physical activity:    Days per week: Not on file    Minutes per session: Not on file  . Stress: Not on file  Relationships  . Social connections:    Talks on phone: Not on file    Gets together: Not on file    Attends religious service: Not on file    Active member of club or organization: Not on file    Attends meetings of clubs or organizations: Not on file    Relationship status: Not on file  Other Topics Concern  . Not on file  Social History Narrative   Lives with wife in a one story home.   11/16/17 living at [facility], [address]   Retired Engineer, maintenance (IT).   Right-handed   Caffeine: 1 cup per day   Family History  Problem Relation Age of Onset  . Other Father        Deceased  . Other Mother        Deceased, 58  . Other Sister        Deceased  . Cancer Sister        Deceased  . Healthy Son        Living  . Heart disease Son        Deceased, 101 from heart valve dysfunction  . Healthy Daughter    Scheduled Meds: . aspirin EC  81 mg Oral Daily  . atorvastatin  80 mg Oral q1800  . bisoprolol  5 mg Oral Daily  . clopidogrel  75 mg Oral Q supper  . donepezil  10 mg Oral QHS  . enoxaparin (LOVENOX) injection  40 mg Subcutaneous Q24H  . [START ON 12/23/2017] Influenza vac split quadrivalent  PF  0.5 mL Intramuscular Tomorrow-1000  . insulin aspart  0-9 Units Subcutaneous TID WC  . losartan  50  mg Oral Daily  . pantoprazole  40 mg Oral Daily  . sertraline  50 mg Oral QHS  . sodium chloride flush  3 mL Intravenous Q12H   Continuous Infusions: . sodium chloride 75 mL/hr at 12/22/17 1342  . cefTRIAXone (ROCEPHIN)  IV     PRN Meds:.acetaminophen **OR** acetaminophen, albuterol, guaiFENesin, ondansetron **OR** ondansetron (ZOFRAN) IV, traMADol Allergies  Allergen Reactions  . Hydrocodone-Acetaminophen Other (See Comments)     Delusions   Review of Systems  Unable to perform ROS: Dementia    Physical Exam  Constitutional: He appears well-developed. He appears ill.  Frail, elderly  HENT:  Head: Normocephalic and atraumatic.  Cardiovascular: Normal rate and regular rhythm.  Pulmonary/Chest: Effort normal. No accessory muscle usage. No tachypnea. No respiratory distress.  Abdominal: Soft. Normal appearance.  Neurological: He is alert. He is disoriented.  Oriented to person  Nursing note and vitals reviewed.   Vital Signs: BP (!) 119/95 (BP Location: Right Arm)   Pulse 74   Temp 99 F (37.2 C) (Oral)   Resp 16   Ht _0  (1.803 m)   Wt 83.6 kg   SpO2 98%   BMI 25.71 kg/m  Pain Scale: Faces       SpO2: SpO2: 98 % O2 Device:SpO2: 98 % O2 Flow Rate: .   IO: Intake/output summary:   Intake/Output Summary (Last 24 hours) at 12/22/2017 1648 Last data filed at 12/22/2017 1400 Gross per 24 hour  Intake 2127.19 ml  Output 477 ml  Net 1650.19 ml    LBM: Last BM Date: 12/22/17 Baseline Weight: Weight: 83.6 kg Most recent weight: Weight: 83.6 kg     Palliative Assessment/Data: 30%     Time In: 1600 Time Out: 1650 Time Total: 50 min Greater than 50%  of this time was spent counseling and coordinating care related to the above assessment and plan.  Signed by: Vinie Sill, NP Palliative Medicine Team Pager # [phone] (M-F 8a-5p) Team Phone #  [phone] (Nights/Weekends)

## 2017-12-22 NOTE — Progress Notes (Signed)
Palliative:  Full note to follow. I met today at William Aguirre bedside. No family/visitors present. I have made note of conversations documented by CSW. RN reports that patient has refused meds and intake other than fluids. He did drink 3 juices when his blood sugar was low.   William Aguirre was fully awake and alert and conversational with me this evening. He is disoriented to place and time (did not know month or year) but able to hold a decent conversation. I helped him eat an orange sherbert during our conversation which he accepted and requested cold fluids which he drank well. He does need assistance with eating/drinking and struggles to bring cup to mouth (although he did try). He talks of being at [facility] for ~4-5 months and that "it is alright but needs some work done." He says the food is overall fairly good there. I asked him about any issues or concerns he has been having lately and he could not think of anything. He tells me that he is feeling better today but complains of right knee pain and recalls his fall and gives details of how and where this occurred. I spoke with him about eating better and he agrees to eat dinner and says that he is hungry. I reviewed menu and called in his dinner requests on his behalf. I also turned the television on for him and he tells me that he enjoys watching golf. Emotional support provided. Discussed my conversation with RN. I will reach out to his daughter tomorrow to discuss [place] further but will await to follow his progress overnight.   Vinie Sill, NP Palliative Medicine Team Pager # [phone] (M-F 8a-5p) Team Phone # [phone] (Nights/Weekends)

## 2017-12-22 NOTE — Progress Notes (Signed)
Patient has eaten nothing this shift, continues to refuse any and all food.  Has drank some apple juice.  Also refused all medications.  MD notified.

## 2017-12-22 NOTE — Plan of Care (Signed)
Patient stable, sleepy for first 6 hours of shift, was able to get patient to intermittently drink some fluids but refused to eat until this evening when he did eat some ice cream.  Patient able to take medications that are crushed, very difficult to get him to swallow whole pills even in ice cream.  Stays on left side d/t pain in his right knee, did agree to Tylenol x 1.  Wearing condom cath, blood tinged urine.

## 2017-12-23 DIAGNOSIS — F0390 Unspecified dementia without behavioral disturbance: Secondary | ICD-10-CM

## 2017-12-23 LAB — CBC WITH DIFFERENTIAL/PLATELET
BASOS ABS: 0 10*3/uL (ref 0.0–0.1)
Basophils Relative: 0 %
EOS ABS: 0.3 10*3/uL (ref 0.0–0.7)
Eosinophils Relative: 2 %
HCT: 38.2 % — ABNORMAL LOW (ref 39.0–52.0)
Hemoglobin: 12.3 g/dL — ABNORMAL LOW (ref 13.0–17.0)
Lymphocytes Relative: 16 %
Lymphs Abs: 2.5 10*3/uL (ref 0.7–4.0)
MCH: 27.4 pg (ref 26.0–34.0)
MCHC: 32.2 g/dL (ref 30.0–36.0)
MCV: 85.1 fL (ref 78.0–100.0)
MONO ABS: 1.7 10*3/uL — AB (ref 0.1–1.0)
Monocytes Relative: 11 %
Neutro Abs: 11.7 10*3/uL — ABNORMAL HIGH (ref 1.7–7.7)
Neutrophils Relative %: 71 %
Platelets: 250 10*3/uL (ref 150–400)
RBC: 4.49 MIL/uL (ref 4.22–5.81)
RDW: 13.9 % (ref 11.5–15.5)
WBC: 16.2 10*3/uL — ABNORMAL HIGH (ref 4.0–10.5)

## 2017-12-23 LAB — GLUCOSE, CAPILLARY
GLUCOSE-CAPILLARY: 142 mg/dL — AB (ref 70–99)
GLUCOSE-CAPILLARY: 191 mg/dL — AB (ref 70–99)
GLUCOSE-CAPILLARY: 200 mg/dL — AB (ref 70–99)
Glucose-Capillary: 245 mg/dL — ABNORMAL HIGH (ref 70–99)

## 2017-12-23 MED ORDER — ENSURE ENLIVE PO LIQD
237.0000 mL | Freq: Two times a day (BID) | ORAL | Status: DC
  Administered 2017-12-23 – 2017-12-29 (×6): 237 mL via ORAL

## 2017-12-23 NOTE — Progress Notes (Signed)
PROGRESS NOTE    William Aguirre  GNF:[id number] DOB: [birth year] DOA: 12/21/2017 PCP: Shirline Frees, NP   Brief Narrative: Patient is a 83 year old male with medical history significant of HTN, HLD, DM type 2, CAD, UTIs, and dementia; who presented for approximately 10 days of altered mental status.He came from [facility]. History mostly obtained from review of records and grandaughter  at bedside .  It was reported that patient fell at the nursing facility.  Patient has a bruise on his left hip.  Patient was found to have leukocytosis, mild fever on presentation.  UA was suggestive of urinary tract infection.  Admitted for UTI and encephalopathy.  Assessment & Plan:   Principal Problem:   UTI (urinary tract infection) Active Problems:   Diabetes mellitus (HCC)   Essential hypertension   CORONARY ATHEROSCLEROSIS NATIVE CORONARY ARTERY   Acute metabolic encephalopathy   Dehydration   Malnutrition (HCC)   Dementia without behavioral disturbance   Urinary tract infection with hematuria:  Patient found to have urinalysis concerning for infection.  Previous history of enterococcus faecalis that was sensitive to ampicillin, nitrofurantoin, and vancomycin.  Patient was treated with empiric antibiotics of Rocephin. Continue Rocephin for now.  Urine culture showing Proteus. Blood culture NGTD Patient also had significant leukocytosis which is improving.  He is currently afebrile.  Acute metabolic encephalopathy:  Much improved today.he is awake, alert and communicative today .Oriented to place and person.He presented with  altered with decreased p.o. intake.  CT imaging of the brain showed no acute abnormalities but global atrophy.  Patient has history of underlying dementia.  Dehydration: Normal saline IV fluids at 75 mL/h   Dementia:Continue Aricept  Diabetes mellitus type 2:Last hemoglobin A1c 8.7 on 08/27/2017.  Patient is followed in the outpatient setting by  Dr. Romero Belling. Levemir on hold.  We will continue sliding scale insulin only.  Malnutrition:   Initial albumin 2.9 on admission.  Patient with decreased p.o. intake over the last 2 days.Low prealbumin .  Requested for nutrition consult.  Essential hypertension: Continue bisoprolol and losartan  CAD: Continue aspirin, Plavix, and statin  Depression: Continue Zoloft  Hyperlipidemia Continue atorvastatin  Fall: Reported to have fallen at [place].  Complains of pain on the left hip.  He has bruise on the left hip.  Imaging did not show any fracture dislocation.  Continue supportive care, pain management.  Patient is a ALF resident. PT evaluated and recommended skilled nursing facility on discharge.  Social worker consulted.  Advanced age/dementia/debility: We have requested for palliative care evaluation due to his multiple comorbidities, advanced age, dementia and decreased oral intake .   DVT prophylaxis: Lovenox Code Status: Full Family Communication: None present at the bed side Disposition:To SNF in 1-2 days Antimicrobials:Rocephin  Subjective: Patient seen and examined the bedside this morning. Mental Status is much better today.  He is awake, alert and oriented to place and person.  He communicated very well.  Objective: Vitals:   12/22/17 1340 12/22/17 1350 12/22/17 2109 12/23/17 0652  BP: (!) 119/95 (!) 119/95 (!) 149/80 (!) 147/87  Pulse: 73 74 68 73  Resp:  16 18 20   Temp:   98.1 F (36.7 C) 97.9 F (36.6 C)  TempSrc:   Oral Oral  SpO2:  98% 96% 99%  Weight:      Height:        Intake/Output Summary (Last 24 hours) at 12/23/2017 1259 Last data filed at 12/23/2017 0723 Gross per 24 hour  Intake  2156.66 ml  Output 800 ml  Net 1356.66 ml   Filed Weights   12/22/17 0101  Weight: 83.6 kg    Examination:  General exam: Appears calm and comfortable ,Not in distress,average built HEENT:PERRL,Oral mucosa moist, Ear/Nose normal on gross  exam Respiratory system: Bilateral equal air entry, normal vesicular breath sounds, no wheezes or crackles  Cardiovascular system: S1 & S2 heard, RRR. No JVD, murmurs, rubs, gallops or clicks. Gastrointestinal system: Abdomen is nondistended, soft and nontender. No organomegaly or masses felt. Normal bowel sounds heard. Central nervous system: Alert and oriented. No focal neurological deficits. Extremities: No edema, no clubbing ,no cyanosis, distal peripheral pulses palpable.Tenderness and bruises on the left hip Skin: No rashes, lesions or ulcers,no icterus ,no pallor MSK: Normal muscle bulk,tone ,power   Data Reviewed: I have personally reviewed following labs and imaging studies  CBC: Recent Labs  Lab 12/21/17 1727 12/21/17 1757 12/22/17 0402 12/23/17 0405  WBC  --  31.7* 26.1* 16.2*  NEUTROABS  --  26.6*  --  11.7*  HGB 13.9 13.6 13.6 12.3*  HCT 41.0 41.1 40.9 38.2*  MCV  --  84.0 83.6 85.1  PLT  --  247 228 250   Basic Metabolic Panel: Recent Labs  Lab 12/21/17 1727 12/21/17 1757 12/22/17 0402  NA 141 142 145  K 4.1 4.1 4.1  CL 108 105 110  CO2  --  25 25  GLUCOSE 136* 134* 101*  BUN 30* 33* 30*  CREATININE 1.10 1.14 1.00  CALCIUM  --  9.2 9.0   GFR: Estimated Creatinine Clearance: 61.7 mL/min (by C-G formula based on SCr of 1 mg/dL). Liver Function Tests: Recent Labs  Lab 12/21/17 1757 12/22/17 0402  AST 48* 47*  ALT 39 38  ALKPHOS 128* 106  BILITOT 0.8 0.9  PROT 7.9 7.0  ALBUMIN 2.9* 2.6*   No results for input(s): LIPASE, AMYLASE in the last 168 hours. Recent Labs  Lab 12/21/17 1757  AMMONIA 9   Coagulation Profile: No results for input(s): INR, PROTIME in the last 168 hours. Cardiac Enzymes: Recent Labs  Lab 12/21/17 1757  TROPONINI <0.03   BNP (last 3 results) No results for input(s): PROBNP in the last 8760 hours. HbA1C: No results for input(s): HGBA1C in the last 72 hours. CBG: Recent Labs  Lab 12/22/17 1202 12/22/17 1607  12/22/17 2108 12/23/17 0756 12/23/17 1145  GLUCAP 128* 157* 181* 142* 245*   Lipid Profile: No results for input(s): CHOL, HDL, LDLCALC, TRIG, CHOLHDL, LDLDIRECT in the last 72 hours. Thyroid Function Tests: Recent Labs    12/21/17 2213  TSH 1.348   Anemia Panel: No results for input(s): VITAMINB12, FOLATE, FERRITIN, TIBC, IRON, RETICCTPCT in the last 72 hours. Sepsis Labs: Recent Labs  Lab 12/21/17 1728  LATICACIDVEN 1.48    Recent Results (from the past 240 hour(s))  Blood Cultures (routine x 2)     Status: None (Preliminary result)   Collection Time: 12/21/17  5:28 PM  Result Value Ref Range Status   Specimen Description   Final    BLOOD LEFT ARM UPPER Performed at [facility], [address], [place], [place] [address]    Special Requests   Final    BOTTLES DRAWN AEROBIC AND ANAEROBIC Blood Culture adequate volume Performed at [facility], [address], [place], [place] [address]    Culture   Final    NO GROWTH 2 DAYS Performed at [facility], [address] [place],  [place] [address]    Report Status PENDING  Incomplete  Urine culture     Status: Abnormal (Preliminary result)   Collection Time: 12/21/17  5:57 PM  Result Value Ref Range Status   Specimen Description   Final    URINE, CLEAN CATCH Performed at [facility], [address] [place], [place] [address]    Special Requests   Final    NONE Performed at [facility], [address] [place], [place] [address]    Culture >=100,000 COLONIES/mL PROTEUS MIRABILIS (A)  Final   Report Status PENDING  Incomplete  Blood Cultures (routine x 2)     Status: None (Preliminary result)   Collection Time: 12/21/17  7:46 PM  Result Value Ref Range Status   Specimen Description   Final    BLOOD RIGHT HAND Performed at [facility], [address] [place], [place] [address]    Special Requests   Final     BOTTLES DRAWN AEROBIC ONLY Blood Culture results may not be optimal due to an inadequate volume of blood received in culture bottles Performed at [facility], [address] [place], [place] [address]    Culture   Final    NO GROWTH 2 DAYS Performed at [facility], [address] [place], [place] [address]    Report Status PENDING  Incomplete  MRSA PCR Screening     Status: None   Collection Time: 12/21/17 10:49 PM  Result Value Ref Range Status   MRSA by PCR NEGATIVE NEGATIVE Final    Comment:        The GeneXpert MRSA Assay (FDA approved for NASAL specimens only), is one component of a comprehensive MRSA colonization surveillance program. It is not intended to diagnose MRSA infection nor to guide or monitor treatment for MRSA infections. Performed at [facility], [address] [place], [place] [address]          Radiology Studies: Dg Chest 2 View  Result Date: 12/21/2017 CLINICAL DATA:  Altered mental status with confusion EXAM: CHEST - 2 VIEW COMPARISON:  December 15, 2017 FINDINGS: There is no edema or consolidation. Heart is upper normal in size with pulmonary vascularity normal. No adenopathy. There is aortic atherosclerosis. There is degenerative change in the thoracic spine and in each shoulder. IMPRESSION: No edema or consolidation. Heart size normal. There is aortic atherosclerosis. Aortic Atherosclerosis (ICD10-I70.0). Electronically Signed   By: Bretta BangWilliam  Woodruff III M.D.   On: 12/21/2017 19:18   Ct Head Wo Contrast  Result Date: 12/21/2017 CLINICAL DATA:  83 year old male with altered mental status and lethargy for the past 10 days. Subsequent encounter. EXAM: CT HEAD WITHOUT CONTRAST TECHNIQUE: Contiguous axial images were obtained from the base of the skull through the vertex without intravenous contrast. COMPARISON:  12/15/2017 and 01/06/2017 head CT. 01/06/2017 brain MR. FINDINGS: Brain: No intracranial  hemorrhage or CT evidence of large acute infarct. Chronic microvascular changes. Moderate global atrophy. No intracranial mass lesion noted on this unenhanced exam. Vascular: Vascular calcifications.  No hyperdense vessel. Skull: Negative Sinuses/Orbits: No acute orbital abnormality. Minimal mucosal thickening inferior left maxillary sinus. Other: Mastoid air cells and middle ear cavities are clear. IMPRESSION: 1. No acute intracranial abnormality. 2. Chronic microvascular changes. 3. Global atrophy. Electronically Signed   By: Lacy DuverneySteven  Olson M.D.   On: 12/21/2017 18:25   Dg Hips Bilat With Pelvis 3-4 Views  Result Date: 12/22/2017 CLINICAL DATA:  Bilateral hip pain.  Unclear injury. EXAM: DG HIP (WITH OR WITHOUT PELVIS) 3-4V BILAT COMPARISON:  Pelvis x-ray dated July 08, 2016. FINDINGS: There is no evidence of hip fracture or dislocation. There is no evidence of arthropathy or other focal bone abnormality. Degenerative changes of the lower lumbar spine. Osteopenia. Soft tissues are unremarkable. Vascular calcifications. IMPRESSION: No acute osseous abnormality. Electronically Signed   By: Obie Dredge M.D.   On: 12/22/2017 11:29        Scheduled Meds: . aspirin EC  81 mg Oral Daily  . atorvastatin  80 mg Oral q1800  . bisoprolol  5 mg Oral Daily  . clopidogrel  75 mg Oral Q supper  . donepezil  10 mg Oral QHS  . enoxaparin (LOVENOX) injection  40 mg Subcutaneous Q24H  . feeding supplement (ENSURE ENLIVE)  237 mL Oral BID BM  . Influenza vac split quadrivalent PF  0.5 mL Intramuscular Tomorrow-1000  . insulin aspart  0-9 Units Subcutaneous TID WC  . losartan  50 mg Oral Daily  . pantoprazole  40 mg Oral Daily  . sertraline  50 mg Oral QHS  . sodium chloride flush  3 mL Intravenous Q12H   Continuous Infusions: . sodium chloride 75 mL/hr at 12/23/17 1018  . cefTRIAXone (ROCEPHIN)  IV Stopped (12/22/17 2048)     LOS: 1 day    Time spent: 25 mins.More than 50% of that time was spent  in counseling and/or coordination of care.      Burnadette Pop, MD [facility] Pager [phone]  If 7PM-7AM, please contact night-coverage [email or web address] Password [facility] 12/23/2017, 12:59 PM

## 2017-12-23 NOTE — Progress Notes (Signed)
Palliative:  I met today with William Aguirre and he is pleasant and consistent with my visit yesterday but has continued with poor intake but is accepting fluids.   I spoke with his daughter, William Aguirre (she says that she is HCPOA). William Aguirre tells me that he has declined and that he has told many people including his pastor that he does not want to go on living. He has endorsed poor QOL and dislikes having to live at ALF and not being able to do for himself. He discussed code status as well as interest in hospice. She says that she knows that her mother had a DNR but she has not discussed this specifically with her father. She is currently getting in her car to come visit her father from out of town (unsure where) and plans to be here to be with him through Sunday morning.   I spoke further with William Aguirre who is somewhat confused to situation and unaware of place and time. He was very clear in his wishes that he is ready when God calls him home. He has no concerns with dying and is ready. He was clear about his wishes for DNR and no life support. He does not want life prolonging measures. I did not call William Aguirre back to express these wishes as is driving.   Recommend DNR and return to [facility] with hospice in place based on my conversation with Mr. Cone. Daughter to be updated when she arrives in town.   86 min  Vinie Sill, NP Palliative Medicine Team Pager # [phone] (M-F 8a-5p) Team Phone # [phone] (Nights/Weekends)

## 2017-12-23 NOTE — Progress Notes (Signed)
Initial Nutrition Assessment  DOCUMENTATION CODES:   Not applicable  INTERVENTION:    Ensure Enlive po BID, each supplement provides 350 kcal and 20 grams of protein  Magic cup TID with meals, each supplement provides 290 kcal and 9 grams of protein  Monitor for goals of care  NUTRITION DIAGNOSIS:   Inadequate oral intake related to lethargy/confusion as evidenced by meal completion < 25%.  GOAL:   Patient will meet greater than or equal to 90% of their needs  MONITOR:   PO intake, Supplement acceptance, Weight trends, Labs  REASON FOR ASSESSMENT:   Consult Assessment of nutrition requirement/status  ASSESSMENT:   Patient with PMH significant for HTN, HLD, DM, CAD, mostly wheelchair bound, recurrent UTIs, and dementia. Presents this admission from [facility] with complaints of worsening altered mental status and poor intake progressing since fall ~10 days ago. Admitted for UTI with hematuria.    Pt oriented to place and self. Yesterday he was able to hold a conversation with palliative provider. Pt could not elaborate on RDs questions this morning as he kept falling asleep mid sentence. He endorses a loss in appetite but unable to state when it began. He eats two meals a day but he is unable to provide a recent recall. Meal completion charted as 10% for yesterdays dinner. RD observed breakfast tray at bedside with eggs, grits, and magic cup ~10-15% completed. Pt denies any issues with swallowing or nausea. Will provide pt with supplements to maximize calore and protein intake.   Pt unable to provide UBW. Records indicate pt weighed 204 lb on 11/16/17 and 184 lb this admission (9.8% wt loss in 1 month, significant for time frame). Nutrition-Focused physical exam completed. Unable to assess bilateral lower extremities at this time. Suspect pt may have some form of malnutrition but unable to diagnosis with lack of recall and limited NFPE.   Family to have goals of care  meeting with palliative today.   Medications reviewed and include: NS @ 75 ml/hr Labs reviewed.   Diet Order:   Diet Order            Diet heart healthy/carb modified Room service appropriate? Yes; Fluid consistency: Thin  Diet effective now              EDUCATION NEEDS:   Not appropriate for education at this time  Skin:  Skin Assessment: Reviewed RN Assessment  Last BM:  12/23/17  Height:   Ht Readings from Last 1 Encounters:  12/22/17 5\' 11"  (1.803 m)    Weight:   Wt Readings from Last 1 Encounters:  12/22/17 83.6 kg    Ideal Body Weight:  78.2 kg  BMI:  Body mass index is 25.71 kg/m.  Estimated Nutritional Needs:   Kcal:  2100-2300 kcal  Protein:  105-120 grams  Fluid:  >/= 2 L/day   Vanessa Kickarly Rustyn Conery RD, LDN Clinical Nutrition Pager # - [phone]

## 2017-12-23 NOTE — Evaluation (Signed)
Physical Therapy Evaluation Patient Details Name: William Aguirre MRN: [id number] DOB: 1935-03-31 Today's Date: 12/23/2017   History of Present Illness  83 yo male admitted with falls and AMS, H/O dementia, HTN, CAD, DM. patient resides at ALF.  Clinical Impression  The patient participated in PT today, patient was cheerful and participated in conversation with  some noted expressive difficulties. Assisted patient to recliner using Stedy lift equipment for safety. Patient left in recliner feeding self. Patient  May require higher level of care. Per report, patient was independent ambulator and fed and dressed himself in recent past. Pt admitted with above diagnosis. Pt currently with functional limitations due to the deficits listed below (see PT Problem List). Pt will benefit from skilled PT to increase their independence and safety with mobility to allow discharge to the venue listed below.       Follow Up Recommendations Home health PT;SNF    Equipment Recommendations  None recommended by PT    Recommendations for Other Services       Precautions / Restrictions Precautions Precautions: Fall      Mobility  Bed Mobility Overal bed mobility: Needs Assistance Bed Mobility: Supine to Sit     Supine to sit: Max assist;+2 for physical assistance;+2 for safety/equipment;HOB elevated     General bed mobility comments: assist with trunk and legs  Transfers Overall transfer level: Needs assistance   Transfers: Sit to/from Stand Sit to Stand: Mod assist;+2 physical assistance;+2 safety/equipment;From elevated surface         General transfer comment: stood from bed x 3 at [facility], Stood for clean up from [facility]. patient stood x 3 minutes, complained of LE discomfort.  Ambulation/Gait             General Gait Details: NT  Stairs            Wheelchair Mobility    Modified Rankin (Stroke Patients Only)       Balance Overall balance assessment: Needs  assistance;History of Falls Sitting-balance support: Feet supported;Bilateral upper extremity supported Sitting balance-Leahy Scale: Fair     Standing balance support: During functional activity;Bilateral upper extremity supported Standing balance-Leahy Scale: Poor                               Pertinent Vitals/Pain Pain Assessment: Faces Faces Pain Scale: Hurts little more Pain Location: legs when standing Pain Descriptors / Indicators: Discomfort;Grimacing;Guarding Pain Intervention(s): Monitored during session    Home Living                        Prior Function                 Hand Dominance        Extremity/Trunk Assessment                Communication      Cognition Arousal/Alertness: Awake/alert Behavior During Therapy: WFL for tasks assessed/performed Overall Cognitive Status: No family/caregiver present to determine baseline cognitive functioning Area of Impairment: Orientation                 Orientation Level: Disoriented to;Place;Time;Situation             General Comments: initially no oriented to place, after being told, pt. then told MD correctly. Noted expressive difficulties. Ppatient did perk up and conversed with therapist. PT asked patient if he ate yellow grits. 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 patient laughing . fixed patient's grits with egg and patient began feeding self.       General Comments      Exercises     Assessment/Plan    PT Assessment Patient needs continued PT services  PT Problem List Decreased strength;Decreased cognition;Decreased knowledge of use of DME;Decreased range of motion;Decreased activity tolerance;Decreased safety awareness;Decreased balance;Decreased knowledge of precautions;Decreased mobility;Pain       PT Treatment Interventions DME instruction;Gait  training;Functional mobility training;Therapeutic activities;Therapeutic exercise;Patient/family education    PT Goals (Current goals can be found in the Care Plan section)  Acute Rehab PT Goals Patient Stated Goal: [facility] to [facility] Goal Formulation: Patient unable to participate in goal setting Time For Goal Achievement: 01/06/18 Potential to Achieve Goals: Good    Frequency Min 2X/week   Barriers to discharge        Co-evaluation               AM-PAC PT "6 Clicks" Daily Activity  Outcome Measure Difficulty turning over in bed (including adjusting bedclothes, sheets and blankets)?: Unable Difficulty moving from lying on back to sitting on the side of the bed? : Unable Difficulty sitting down on and standing up from a chair with arms (e.g., wheelchair, bedside commode, etc,.)?: Unable Help needed moving to and from a bed to chair (including a wheelchair)?: Total Help needed walking in hospital room?: Total Help needed climbing 3-5 steps with a railing? : Total 6 Click Score: 6    End of Session   Activity Tolerance: Patient tolerated treatment well Patient left: in chair;with call bell/phone within reach;with chair alarm set Nurse Communication: Mobility status PT Visit Diagnosis: Unsteadiness on feet (R26.81);History of falling (Z91.81);Adult, failure to thrive (R62.7)    Time: 0829-0915 PT Time Calculation (min) (ACUTE ONLY): 46 min   Charges:   PT Evaluation $PT Eval Low Complexity: 1 Low PT Treatments $Therapeutic Activity: 23-37 mins        Blanchard KelchKaren Nasim Garofano PT Acute Rehabilitation Services Pager [phone] Office [phone]    Rada HayHill, Syriah Delisi Elizabeth 12/23/2017, 9:37 AM

## 2017-12-24 DIAGNOSIS — Z66 Do not resuscitate: Secondary | ICD-10-CM

## 2017-12-24 DIAGNOSIS — Z515 Encounter for palliative care: Secondary | ICD-10-CM

## 2017-12-24 DIAGNOSIS — Z7189 Other specified counseling: Secondary | ICD-10-CM

## 2017-12-24 LAB — CBC WITH DIFFERENTIAL/PLATELET
Basophils Absolute: 0 10*3/uL (ref 0.0–0.1)
Basophils Relative: 0 %
Eosinophils Absolute: 0.2 10*3/uL (ref 0.0–0.7)
Eosinophils Relative: 2 %
HEMATOCRIT: 40.5 % (ref 39.0–52.0)
HEMOGLOBIN: 13.1 g/dL (ref 13.0–17.0)
LYMPHS PCT: 15 %
Lymphs Abs: 1.8 10*3/uL (ref 0.7–4.0)
MCH: 27.5 pg (ref 26.0–34.0)
MCHC: 32.3 g/dL (ref 30.0–36.0)
MCV: 85.1 fL (ref 78.0–100.0)
MONO ABS: 1.5 10*3/uL — AB (ref 0.1–1.0)
MONOS PCT: 13 %
NEUTROS ABS: 8.4 10*3/uL — AB (ref 1.7–7.7)
NEUTROS PCT: 70 %
Platelets: 278 10*3/uL (ref 150–400)
RBC: 4.76 MIL/uL (ref 4.22–5.81)
RDW: 13.8 % (ref 11.5–15.5)
WBC: 11.9 10*3/uL — ABNORMAL HIGH (ref 4.0–10.5)

## 2017-12-24 LAB — GLUCOSE, CAPILLARY
GLUCOSE-CAPILLARY: 194 mg/dL — AB (ref 70–99)
Glucose-Capillary: 166 mg/dL — ABNORMAL HIGH (ref 70–99)
Glucose-Capillary: 227 mg/dL — ABNORMAL HIGH (ref 70–99)
Glucose-Capillary: 184 mg/dL — ABNORMAL HIGH (ref 70–99)

## 2017-12-24 LAB — URINE CULTURE

## 2017-12-24 MED ORDER — CEPHALEXIN 500 MG PO CAPS: 500.0000 mg | ORAL_CAPSULE | Freq: Two times a day (BID) | ORAL | 0 refills | Status: DC

## 2017-12-24 MED ORDER — ENSURE ENLIVE PO LIQD: 237.0000 mL | Freq: Two times a day (BID) | ORAL | 0 refills | Status: AC

## 2017-12-24 MED ORDER — LOSARTAN POTASSIUM 100 MG PO TABS: 100.0000 mg | ORAL_TABLET | Freq: Every day | ORAL | Status: AC

## 2017-12-24 MED ORDER — CEPHALEXIN 500 MG PO CAPS
500.0000 mg | ORAL_CAPSULE | Freq: Two times a day (BID) | ORAL | Status: AC
  Administered 2017-12-24 – 2017-12-28 (×9): 500 mg via ORAL
  Filled 2017-12-24 (×9): qty 1

## 2017-12-24 MED ORDER — LOSARTAN POTASSIUM 50 MG PO TABS
100.0000 mg | ORAL_TABLET | Freq: Every day | ORAL | Status: DC
  Administered 2017-12-27 – 2017-12-29 (×3): 100 mg via ORAL
  Filled 2017-12-24 (×4): qty 2

## 2017-12-24 MED ORDER — LOSARTAN POTASSIUM 50 MG PO TABS
50.0000 mg | ORAL_TABLET | Freq: Once | ORAL | Status: DC
  Filled 2017-12-24: qty 1

## 2017-12-24 NOTE — Progress Notes (Signed)
Daily Progress Note   Patient Name: William Aguirre       Date: 12/24/2017 DOB: 10/21/1934  Age: 83 y.o. MRN#: [id number] Attending Physician: Burnadette Pop, MD Primary Care Physician: Shirline Frees, NP Admit Date: 12/21/2017  Reason for Consultation/Follow-up: Establishing goals of care  Subjective: Patient in bed, sitting up eating Magic Cup. His Pastors are at the bedside. Patient is alert however unable to identify who Pastors were, his name, or location. When questions asked he continued to just stare at wall and eat ice cream. He did deny any pain or discomfort. No other family at bedside.   I call and spoke to patient's daughter, [name] [facility]) to confirm goals of care for patient. My colleague, Hessie Knows, NP has been involved in patient's care previously. Daughter reports her goal is for patient to be discharged back to [place] facilities with outpatient hospice services. She confirmed he was a DNR/DNI. Daughter reports she is from out of town and trying to handle some personal issues. She acknowledge that patient at this time has been deemed unable to return to [facility] and she was not happy about this information. I inquired if she had been in contact with [facility] DSS as advised earlier by CSW. Daughter stated she has not and that her goal was for her father to return to [place] with hospice. Advised that at this time he could not be discharged until further documentation was received and logistics were resolved with [facility]. Daughter did not want to discuss further. She verbalized appreciation of Palliative services and again stated her goals were for him to discharge back with hospice.    Length of Stay: 2  Current Medications: Scheduled Meds:  . aspirin EC  81 mg Oral  Daily  . atorvastatin  80 mg Oral q1800  . bisoprolol  5 mg Oral Daily  . cephALEXin  500 mg Oral Q12H  . clopidogrel  75 mg Oral Q supper  . donepezil  10 mg Oral QHS  . enoxaparin (LOVENOX) injection  40 mg Subcutaneous Q24H  . feeding supplement (ENSURE ENLIVE)  237 mL Oral BID BM  . insulin aspart  0-9 Units Subcutaneous TID WC  . losartan  50 mg Oral Daily  . pantoprazole  40 mg Oral Daily  . sertraline  50 mg Oral QHS  .  sodium chloride flush  3 mL Intravenous Q12H    Continuous Infusions: . sodium chloride 75 mL/hr at 12/24/17 0600    PRN Meds: acetaminophen **OR** acetaminophen, albuterol, guaiFENesin, ondansetron **OR** ondansetron (ZOFRAN) IV, traMADol  Physical Exam  Constitutional: Vital signs are normal. He appears ill.  Chronically ill appearing   Cardiovascular: Normal rate, regular rhythm, normal heart sounds and normal pulses.  Pulmonary/Chest: Effort normal. He has decreased breath sounds.  Neurological: He is alert. He is disoriented.  Psychiatric: Cognition and memory are impaired. He expresses inappropriate judgment.  Nursing note and vitals reviewed.           Vital Signs: BP (!) 175/91 (BP Location: Right Arm)   Pulse 66   Temp 97.9 F (36.6 C)   Resp 18   Ht 5\' 11"  (1.803 m)   Wt 83.6 kg   SpO2 94%   BMI 25.71 kg/m  SpO2: SpO2: 94 % O2 Device: O2 Device: Room Air O2 Flow Rate:    Intake/output summary:   Intake/Output Summary (Last 24 hours) at 12/24/2017 1424 Last data filed at 12/24/2017 1300 Gross per 24 hour  Intake 2806.64 ml  Output 300 ml  Net 2506.64 ml   LBM: Last BM Date: 12/22/17 Baseline Weight: Weight: 83.6 kg Most recent weight: Weight: 83.6 kg       Palliative Assessment/Data: PPS 30%   Flowsheet Rows     Most Recent Value  Intake Tab  Referral Department  Hospitalist  Palliative Care Primary Diagnosis  Neurology  Date Notified  12/22/17  Palliative Care Type  New Palliative care  Reason for referral  Clarify  Goals of Care, Counsel Regarding Hospice  Date of Admission  12/21/17  Date first seen by Palliative Care  12/22/17  # of days Palliative referral response time  0 Day(s)  # of days IP prior to Palliative referral  1  Clinical Assessment  Psychosocial & Spiritual Assessment  Palliative Care Outcomes      Patient Active Problem List   Diagnosis Date Noted  . Dementia without behavioral disturbance   . Acute metabolic encephalopathy 12/22/2017  . Dehydration 12/22/2017  . Malnutrition (HCC) 12/22/2017  . Cervical myelopathy (HCC) 11/16/2017  . TIA (transient ischemic attack) 01/07/2017  . UTI (urinary tract infection) 01/07/2017  . Stroke-like symptoms 01/06/2017  . Right knee pain 07/08/2016  . Unable to bear weight 07/08/2016  . Hypoglycemia 07/08/2016  . Upper airway cough syndrome 01/31/2015  . Dyspnea 01/31/2015  . Special screening for malignant neoplasms, colon 06/01/2013  . Mild cognitive impairment with memory loss 06/01/2013  . Unsteady gait 05/08/2013  . Hyperlipidemia 08/18/2008  . Essential hypertension 08/18/2008  . INTRAOCULAR LENS IMPLANT, HX OF 08/18/2008  . CATARACT EXTRACTION, RIGHT EYE, HX OF 08/18/2008  . Diabetes mellitus (HCC) 08/17/2008  . CORONARY ATHEROSCLEROSIS NATIVE CORONARY ARTERY 08/13/2008    Palliative Care Assessment & Plan   Patient Profile: 83 y.o. male  with past medical history of HTN, HLD, DM type 2, CAD, UTIs, and dementia admitted from [facility] on 12/21/2017 with worsening altered mental status and poor intake progressing since fall ~10 days ago. Per report he is much changed from baseline where he participates in his ADLs, mostly wheelchair bound, and converses with staff and visitors. Being treated for UTI and dehydration with no fractures or injury evident from recent fall.  Recommendations/Plan:  DNR/DNI as confirmed  Daughter verbalizes her goal is for her father to be discharged back to [place] with outpatient  hospice services.   CSW in conversations with daughter as their are now dispo concerns in regards to level of care needed and payor information.   Palliative Medicine team will continue to support patient, family, and medical team during hospitalization as needed. Please call.   Goals of Care and Additional Recommendations:  Limitations on Scope of Treatment: Full Scope Treatment-Continue to treat the treatable with goal of discharging with hospice services   Code Status:    Code Status Orders  (From admission, onward)         Start     Ordered   12/24/17 0736  Do not attempt resuscitation (DNR)  Continuous    Question Answer Comment  In the event of cardiac or respiratory ARREST Do not call a "code blue"   In the event of cardiac or respiratory ARREST Do not perform Intubation, CPR, defibrillation or ACLS   In the event of cardiac or respiratory ARREST Use medication by any route, position, wound care, and other measures to relive pain and suffering. May use oxygen, suction and manual treatment of airway obstruction as needed for comfort.      12/24/17 0735        Code Status History    Date Active Date Inactive Code Status Order ID Comments User Context   12/21/2017 1950 12/24/2017 0735 Full Code [id number]  Clydie BraunSmith, Rondell A, MD ED   01/06/2017 1538 01/08/2017 2327 Full Code [id number]  Marcos EkeWertman, Sara E, PA-C ED   07/08/2016 1713 07/09/2016 2142 Full Code [id number]  Cleora FleetJohnson, Clanford L, MD ED   05/28/2013 2021 05/29/2013 0608 Full Code [id number]  Muthersbaugh, Boyd KerbsHannah, PA-C ED      Prognosis:   Unable to determine-guarded to poor in the setting of dementia, dehydration, malnutrition, decreased mobility, CAD, hypertension, and encephalopathy.   Discharge Planning:  To Be Determined (CSW in discussion with daughter) outpatient hospice to be involved once discharged   Care plan was discussed with patient's daughter, bedside RN, and Cala BradfordKimberly, CSW.   Thank you for allowing the  Palliative Medicine Team to assist in the care of this patient.   Total Time 45 min.  Prolonged Time Billed  NO        Greater than 50%  of this time was spent counseling and coordinating care related to the above assessment and plan.  Willette AlmaNikki Pickenpack-Cousar, AGPCNP-BC Palliative Medicine Team  Phone: [phone] Fax: [phone] Pager: [phone] Amion: Thea AlkenN. Cousar   Please contact Palliative Medicine Team phone at [phone] for questions and concerns.

## 2017-12-24 NOTE — [place] FL2 (Signed)
[place] MEDICAID FL2 LEVEL OF CARE SCREENING TOOL     IDENTIFICATION  Patient Name: William Aguirre Birthdate: 06-27-1934 Sex: male Admission Date (Current Location): 12/21/2017  [facility] and [place] Number:  Producer, television/film/video and Address:  [facility],  [address] [place] [address]      Provider Number: [id number]  Attending Physician Name and Address:  Burnadette Pop, MD  Relative Name and Phone Number:       Current Level of Care: Hospital Recommended Level of Care: Skilled Nursing Facility Prior Approval Number:    Date Approved/Denied:   PASRR Number: [id number]  Discharge Plan: SNF    Current Diagnoses: Patient Active Problem List   Diagnosis Date Noted  . Dementia without behavioral disturbance   . Acute metabolic encephalopathy 12/22/2017  . Dehydration 12/22/2017  . Malnutrition (HCC) 12/22/2017  . Cervical myelopathy (HCC) 11/16/2017  . TIA (transient ischemic attack) 01/07/2017  . UTI (urinary tract infection) 01/07/2017  . Stroke-like symptoms 01/06/2017  . Right knee pain 07/08/2016  . Unable to bear weight 07/08/2016  . Hypoglycemia 07/08/2016  . Upper airway cough syndrome 01/31/2015  . Dyspnea 01/31/2015  . Special screening for malignant neoplasms, colon 06/01/2013  . Mild cognitive impairment with memory loss 06/01/2013  . Unsteady gait 05/08/2013  . Hyperlipidemia 08/18/2008  . Essential hypertension 08/18/2008  . INTRAOCULAR LENS IMPLANT, HX OF 08/18/2008  . CATARACT EXTRACTION, RIGHT EYE, HX OF 08/18/2008  . Diabetes mellitus (HCC) 08/17/2008  . CORONARY ATHEROSCLEROSIS NATIVE CORONARY ARTERY 08/13/2008    Orientation RESPIRATION BLADDER Height & Weight     Self  Normal Incontinent Weight: 184 lb 4.9 oz (83.6 kg) Height:  5\' 11"  (180.3 cm)  BEHAVIORAL SYMPTOMS/MOOD NEUROLOGICAL BOWEL NUTRITION STATUS        Diet(heart healthy/carb modified)  AMBULATORY STATUS COMMUNICATION OF NEEDS Skin   Extensive Assist  Verbally Other (Comment)( Wound/Incision(OpenorDehisced)09/10/19Other(Comment)HipRight;ProximalRighthipbruisedwithsmallopenwound-Possibleoldblister?  Foam Dressing)                       Personal Care Assistance Level of Assistance  Bathing, Feeding, Dressing Bathing Assistance: Maximum assistance Feeding assistance: Maximum assistance Dressing Assistance: Maximum assistance     Functional Limitations Info  Sight, Hearing, Speech Sight Info: Impaired Hearing Info: Adequate Speech Info: Adequate    SPECIAL CARE FACTORS FREQUENCY                       Contractures      Additional Factors Info  Code Status, Allergies Code Status Info: DNR Allergies Info: Hydrocodone-acetaminophen           Current Medications (12/24/2017):  This is the current hospital active medication list Current Facility-Administered Medications  Medication Dose Route Frequency Provider Last Rate Last Dose  . 0.9 %  sodium chloride infusion   Intravenous Continuous Burnadette Pop, MD 75 mL/hr at 12/24/17 0600    . acetaminophen (TYLENOL) tablet 650 mg  650 mg Oral Q6H PRN Madelyn Flavors A, MD   650 mg at 12/22/17 1655   Or  . acetaminophen (TYLENOL) suppository 650 mg  650 mg Rectal Q6H PRN Smith, Rondell A, MD      . albuterol (PROVENTIL) (2.5 MG/3ML) 0.083% nebulizer solution 2.5 mg  2.5 mg Nebulization Q6H PRN Madelyn Flavors A, MD      . aspirin EC tablet 81 mg  81 mg Oral Daily Katrinka Blazing, Rondell A, MD   81 mg at 12/24/17 0738  . atorvastatin (LIPITOR) tablet  80 mg  80 mg Oral q1800 Madelyn FlavorsSmith, Rondell A, MD   80 mg at 12/23/17 1724  . bisoprolol (ZEBETA) tablet 5 mg  5 mg Oral Daily Madelyn FlavorsSmith, Rondell A, MD   5 mg at 12/24/17 0739  . cephALEXin (KEFLEX) capsule 500 mg  500 mg Oral Q12H Adhikari, Amrit, MD      . clopidogrel (PLAVIX) tablet 75 mg  75 mg Oral Q supper Madelyn FlavorsSmith, Rondell A, MD   75 mg at 12/23/17 1724  . donepezil (ARICEPT) tablet 10 mg  10 mg Oral QHS Smith, Rondell A, MD    10 mg at 12/23/17 2055  . enoxaparin (LOVENOX) injection 40 mg  40 mg Subcutaneous Q24H Madelyn FlavorsSmith, Rondell A, MD   40 mg at 12/23/17 2055  . feeding supplement (ENSURE ENLIVE) (ENSURE ENLIVE) liquid 237 mL  237 mL Oral BID BM Adhikari, Amrit, MD   237 mL at 12/23/17 1413  . guaiFENesin (MUCINEX) 12 hr tablet 600 mg  600 mg Oral BID PRN Smith, Rondell A, MD      . insulin aspart (novoLOG) injection 0-9 Units  0-9 Units Subcutaneous TID WC Madelyn FlavorsSmith, Rondell A, MD   2 Units at 12/24/17 1236  . [START ON 12/25/2017] losartan (COZAAR) tablet 100 mg  100 mg Oral Daily Adhikari, Amrit, MD      . losartan (COZAAR) tablet 50 mg  50 mg Oral Once Burnadette PopAdhikari, Amrit, MD      . ondansetron (ZOFRAN) tablet 4 mg  4 mg Oral Q6H PRN Madelyn FlavorsSmith, Rondell A, MD       Or  . ondansetron (ZOFRAN) injection 4 mg  4 mg Intravenous Q6H PRN Smith, Rondell A, MD      . pantoprazole (PROTONIX) EC tablet 40 mg  40 mg Oral Daily Katrinka BlazingSmith, Rondell A, MD   40 mg at 12/24/17 0738  . sertraline (ZOLOFT) tablet 50 mg  50 mg Oral QHS Madelyn FlavorsSmith, Rondell A, MD   50 mg at 12/23/17 2055  . sodium chloride flush (NS) 0.9 % injection 3 mL  3 mL Intravenous Q12H Smith, Rondell A, MD   3 mL at 12/24/17 0740  . traMADol (ULTRAM) tablet 50 mg  50 mg Oral BID PRN Clydie BraunSmith, Rondell A, MD         Discharge Medications: Please see discharge summary for a list of discharge medications.  Relevant Imaging Results:  Relevant Lab Results:   Additional Information SS#: [id number]   [facility](Hospice Care Services)  Antionette PolesKimberly L Tylah Mancillas, LCSW

## 2017-12-24 NOTE — Discharge Summary (Addendum)
Physician Discharge Summary  William Aguirre RUE:[id number] DOB: 09-May-1934 DOA: 12/21/2017  PCP: Shirline Frees, NP  Admit date: 12/21/2017 Discharge date: 12/24/2017  Admitted From: Home Disposition:  Home  Discharge Condition:Stable CODE STATUS: DNR Diet recommendation: Heart Healthy   Brief/Interim Summary: Patient is a 83 year old male with medical history significant ofHTN, HLD, DM type 2, CAD, UTIs, and dementia; who presented for approximately 10 days of altered mental status.He came from [facility].History mostly obtained from review of recordsand grandaughter  at bedside.  It was reported that patient fell at the nursing facility.  Patient has a bruise on his left hip.  Patient was found to have leukocytosis, mild fever on presentation.  UA was suggestive of urinary tract infection.  Urine culture showed Proteus.  He was being managed for  UTI and encephalopathy.  Mental status significantly improved during the hospitalization and is currently on baseline.  He is hemodynamically stable.  His oral intake has improved.  Palliative care also worked with the patient regarding goals of care, possible hospice initiation.  After discussion with the daughter and patient , patient was made DNR.  Plan is to follow-up with hospice at [facility].  Following problems were addressed during his hospitalization:  Urinary tract infection with hematuria: Patient found to have urinalysis concerning for infection. Previous history of enterococcus faecalis that was sensitive to ampicillin, nitrofurantoin, and vancomycin.Patient was started  on Rocephin. Urine culture showed pansensitive Proteus. Blood culture NGTD Patient also had significant leukocytosis which has significantly improved.  He is currently hemodynamically stable. Antibiotics changed to Keflex on discharge.  Acutemetabolic encephalopathy: Much improved today.he is awake, alert and communicative today  .Oriented to place and person.He presented with  altered with decreased p.o. intake. CT imaging of the brain showed no acute abnormalities but global atrophy.  Patient has history of underlying dementia.  Encephalopathy was secondary to UTI which has resolved.  Dehydration:Started on IV 75 mL/h.  Oral intake has much improved.  Dementia:Continue Aricept  Diabetes mellitus type 2:Last hemoglobin A1c 8.7 on 08/27/2017. Patient is followed in the outpatient setting by Dr. Tyron Russell. Resume home medications on discharge.  Malnutrition:  Initial albumin 2.9 on admission. Patient with decreased p.o. intake over the last 2 days.Lowprealbumin .    Nutrition evaluated him during the hospitalization.  Essential hypertension: Continue bisoprolol and losartan  CAD: Continue aspirin, Plavix, and statin  Depression: Continue Zoloft  Hyperlipidemia Continue atorvastatin  Fall: Reported to have fallen at [facility].  Complains of pain on the left hip.  He has bruise on the left hip.  Imaging did not show any fracture dislocation.  Continue supportive care, pain management.  Patient is a ALF resident. PT evaluated and recommended skilled nursing facility on discharge.  Social worker consulted.  Advanced age/dementia/debility: We had requested for palliative care evaluation due to his multiple comorbidities, advanced age, dementia and decreased oral intake .  After discussion with the daughter and the patient himself , cholecystitis changed to DNR.  Palliative care recommended to follow-up with hospice at [facility]   Discharge Diagnoses:  Principal Problem:   UTI (urinary tract infection) Active Problems:   Diabetes mellitus (HCC)   Essential hypertension   CORONARY ATHEROSCLEROSIS NATIVE CORONARY ARTERY   Acute metabolic encephalopathy   Dehydration   Malnutrition (HCC)   Dementia without behavioral disturbance    Discharge Instructions  Discharge Instructions    Diet -  low sodium heart healthy   Complete by:  As directed    Discharge  instructions   Complete by:  As directed    1) Please take prescribed medications as instructed. 2)Do CBC and Bmp tests in a week.   Increase activity slowly   Complete by:  As directed      Allergies as of 12/24/2017      Reactions   Hydrocodone-acetaminophen Other (See Comments)    Delusions      Medication List    TAKE these medications   acetaminophen 325 MG tablet Commonly known as:  TYLENOL Take 2 tablets (650 mg total) by mouth every 6 (six) hours as needed for mild pain (or Fever >/= 101).   aspirin 81 MG EC tablet Take 1 tablet (81 mg total) by mouth daily. Swallow whole.   atorvastatin 80 MG tablet Commonly known as:  LIPITOR TAKE 1 TABLET BY MOUTH ONCE DAILY What changed:    how much to take  how to take this  when to take this   bisoprolol 5 MG tablet Commonly known as:  ZEBETA Take 1 tablet (5 mg total) by mouth 2 (two) times daily. What changed:  when to take this   cephALEXin 500 MG capsule Commonly known as:  KEFLEX Take 1 capsule (500 mg total) by mouth every 12 (twelve) hours.   clopidogrel 75 MG tablet Commonly known as:  PLAVIX TAKE 1 TABLET BY MOUTH ONCE DAILY WITH BREAKFAST What changed:  See the new instructions.   cyclobenzaprine 5 MG tablet Commonly known as:  FLEXERIL Take 1 tablet (5 mg total) by mouth 2 (two) times daily as needed for muscle spasms.   donepezil 10 MG tablet Commonly known as:  ARICEPT Take 1 tablet (10 mg total) by mouth at bedtime.   feeding supplement (ENSURE ENLIVE) Liqd Take 237 mLs by mouth 2 (two) times daily between meals.   insulin aspart 100 UNIT/ML injection Commonly known as:  novoLOG Inject 3 Units into the skin 3 (three) times daily with meals. Only if cbg over 300   Insulin Detemir 100 UNIT/ML Pen Commonly known as:  LEVEMIR Inject 35 Units into the skin every morning.   losartan 100 MG tablet Commonly known as:   COZAAR Take 1 tablet (100 mg total) by mouth daily. Start taking on:  12/25/2017 What changed:    medication strength  how much to take   ondansetron 4 MG tablet Commonly known as:  ZOFRAN Take 1 tablet (4 mg total) by mouth every 8 (eight) hours as needed for nausea or vomiting.   pantoprazole 40 MG tablet Commonly known as:  PROTONIX Take 1 tablet (40 mg total) by mouth daily.   sertraline 50 MG tablet Commonly known as:  ZOLOFT Take 50 mg by mouth at bedtime.   traMADol 50 MG tablet Commonly known as:  ULTRAM Take 1 tablet (50 mg total) by mouth 2 (two) times daily as needed for severe pain.   TUSSIN DM 10-100 MG/5ML liquid Generic drug:  Dextromethorphan-guaiFENesin Take 5 mLs by mouth every 12 (twelve) hours.      Follow-up Information    Shirline FreesNafziger, Cory, NP. Schedule an appointment as soon as possible for a visit in 1 week(s).   Specialty:  Family Medicine Contact information: [address] [place] [id number] [phone]          Allergies  Allergen Reactions  . Hydrocodone-Acetaminophen Other (See Comments)     Delusions    Consultations:  Palliative care   Procedures/Studies: Dg Chest 2 View  Result Date: 12/21/2017 CLINICAL DATA:  Altered  mental status with confusion EXAM: CHEST - 2 VIEW COMPARISON:  December 15, 2017 FINDINGS: There is no edema or consolidation. Heart is upper normal in size with pulmonary vascularity normal. No adenopathy. There is aortic atherosclerosis. There is degenerative change in the thoracic spine and in each shoulder. IMPRESSION: No edema or consolidation. Heart size normal. There is aortic atherosclerosis. Aortic Atherosclerosis (ICD10-I70.0). Electronically Signed   By: Bretta Bang III M.D.   On: 12/21/2017 19:18   Dg Chest 2 View  Result Date: 12/15/2017 CLINICAL DATA:  Altered mental status EXAM: CHEST - 2 VIEW COMPARISON:  01/06/2017 FINDINGS: Low lung volumes with slight crowding of interstitial  lung markings. Chronic elevation of the right hemidiaphragm. Top normal size cardiac silhouette with mild aortic atherosclerosis. No aneurysm is identified. Degenerative changes are present about the [facility] and glenohumeral joints. No alveolar consolidation, effusion or pneumothorax. No overt pulmonary edema. Subsegmental atelectasis is seen in the left mid lung. IMPRESSION: Low lung volumes without active pulmonary disease. Aortic atherosclerosis (ICD10-I70.0) Electronically Signed   By: Tollie Eth M.D.   On: 12/15/2017 17:34   Ct Head Wo Contrast  Result Date: 12/21/2017 CLINICAL DATA:  83 year old male with altered mental status and lethargy for the past 10 days. Subsequent encounter. EXAM: CT HEAD WITHOUT CONTRAST TECHNIQUE: Contiguous axial images were obtained from the base of the skull through the vertex without intravenous contrast. COMPARISON:  12/15/2017 and 01/06/2017 head CT. 01/06/2017 brain MR. FINDINGS: Brain: No intracranial hemorrhage or CT evidence of large acute infarct. Chronic microvascular changes. Moderate global atrophy. No intracranial mass lesion noted on this unenhanced exam. Vascular: Vascular calcifications.  No hyperdense vessel. Skull: Negative Sinuses/Orbits: No acute orbital abnormality. Minimal mucosal thickening inferior left maxillary sinus. Other: Mastoid air cells and middle ear cavities are clear. IMPRESSION: 1. No acute intracranial abnormality. 2. Chronic microvascular changes. 3. Global atrophy. Electronically Signed   By: Lacy Duverney M.D.   On: 12/21/2017 18:25   Ct Head Wo Contrast  Result Date: 12/15/2017 CLINICAL DATA:  Altered mental status EXAM: CT HEAD WITHOUT CONTRAST TECHNIQUE: Contiguous axial images were obtained from the base of the skull through the vertex without intravenous contrast. COMPARISON:  Head CT 01/06/2017 FINDINGS: Brain: There is no mass, hemorrhage or extra-axial collection. There is generalized atrophy without lobar predilection. There is  hypoattenuation of the periventricular white matter, most commonly indicating chronic ischemic microangiopathy. Vascular: Atherosclerotic calcification of the internal carotid arteries at the skull base. No abnormal hyperdensity of the major intracranial arteries or dural venous sinuses. Skull: The visualized skull base, calvarium and extracranial soft tissues are normal. Sinuses/Orbits: No fluid levels or advanced mucosal thickening of the visualized paranasal sinuses. No mastoid or middle ear effusion. The orbits are normal. IMPRESSION: 1. No acute intracranial abnormality. 2. Atrophy and chronic ischemic microangiopathy. Electronically Signed   By: Deatra Robinson M.D.   On: 12/15/2017 14:26   Dg Knee Complete 4 Views Right  Result Date: 12/14/2017 CLINICAL DATA:  Fall 2 days ago.  Pain.  Swelling. EXAM: RIGHT KNEE - COMPLETE 4+ VIEW COMPARISON:  MRI 07/08/2016. FINDINGS: Prominent suprapatellar soft tissue swelling. Prominent knee joint effusion. Tiny corticated bony densities noted adjacent to the patella most likely related old injury and/or degenerative change. No definite acute abnormality identified. Tricompartment degenerative change with chondrocalcinosis. Peripheral vascular calcification IMPRESSION: 1. Prominent suprapatellar soft tissue swelling. Prominent knee joint effusion. Tiny corticated bony densities noted adjacent to the patella most likely related to old injury and/or degenerative change. No definite  acute abnormality identified. 2.  Tricompartment degenerative change with chondrocalcinosis. 3.  Peripheral vascular disease. Electronically Signed   By: Maisie Fus  Register   On: 12/14/2017 12:19   Dg Hips Bilat With Pelvis 3-4 Views  Result Date: 12/22/2017 CLINICAL DATA:  Bilateral hip pain.  Unclear injury. EXAM: DG HIP (WITH OR WITHOUT PELVIS) 3-4V BILAT COMPARISON:  Pelvis x-ray dated July 08, 2016. FINDINGS: There is no evidence of hip fracture or dislocation. There is no evidence of  arthropathy or other focal bone abnormality. Degenerative changes of the lower lumbar spine. Osteopenia. Soft tissues are unremarkable. Vascular calcifications. IMPRESSION: No acute osseous abnormality. Electronically Signed   By: Obie Dredge M.D.   On: 12/22/2017 11:29      Subjective: Patient seen and examined the bedside this morning.  Remains comfortable.  Mental status is on baseline.  He is alert, awake and oriented to place and person.  Hemodynamically stable  Discharge Exam: Vitals:   12/24/17 0438 12/24/17 1500  BP: (!) 175/91 (!) 141/81  Pulse: 66 67  Resp: 18 18  Temp: 97.9 F (36.6 C)   SpO2: 94% 97%   Vitals:   12/23/17 1543 12/23/17 2126 12/24/17 0438 12/24/17 1500  BP: (!) 156/74 (!) 161/78 (!) 175/91 (!) 141/81  Pulse: 74 67 66 67  Resp: 17 18 18 18   Temp: 98.2 F (36.8 C) 98.3 F (36.8 C) 97.9 F (36.6 C)   TempSrc: Oral Oral    SpO2: 98% 98% 94% 97%  Weight:      Height:        General: Pt is alert, awake, not in acute distress Cardiovascular: RRR, S1/S2 +, no rubs, no gallops Respiratory: CTA bilaterally, no wheezing, no rhonchi Abdominal: Soft, NT, ND, bowel sounds + Extremities: no edema, no cyanosis    The results of significant diagnostics from this hospitalization (including imaging, microbiology, ancillary and laboratory) are listed below for reference.     Microbiology: Recent Results (from the past 240 hour(s))  Blood Cultures (routine x 2)     Status: None (Preliminary result)   Collection Time: 12/21/17  5:28 PM  Result Value Ref Range Status   Specimen Description   Final    BLOOD LEFT ARM UPPER Performed at [facility], [address] [place], [place] [address]    Special Requests   Final    BOTTLES DRAWN AEROBIC AND ANAEROBIC Blood Culture adequate volume Performed at [facility], [address] [place], [place] [address]    Culture   Final    NO GROWTH 3 DAYS Performed at [facility], [address], [place], [place] [address]    Report Status PENDING  Incomplete  Urine culture     Status: Abnormal   Collection Time: 12/21/17  5:57 PM  Result Value Ref Range Status   Specimen Description   Final    URINE, CLEAN CATCH Performed at [facility], [address], [place], [place] [address]    Special Requests   Final    NONE Performed at [facility], [address], [place], [place] [address]    Culture >=100,000 COLONIES/mL PROTEUS MIRABILIS (A)  Final   Report Status 12/24/2017 FINAL  Final   Organism ID, Bacteria PROTEUS MIRABILIS (A)  Final      Susceptibility   Proteus mirabilis - MIC*    AMPICILLIN <=2 SENSITIVE Sensitive     CEFAZOLIN <=4 SENSITIVE Sensitive     CEFTRIAXONE <=  1 SENSITIVE Sensitive     CIPROFLOXACIN >=4 RESISTANT Resistant     GENTAMICIN <=1 SENSITIVE Sensitive     IMIPENEM 4 SENSITIVE Sensitive     NITROFURANTOIN 128 RESISTANT Resistant     TRIMETH/SULFA 40 SENSITIVE Sensitive     AMPICILLIN/SULBACTAM <=2 SENSITIVE Sensitive     PIP/TAZO <=4 SENSITIVE Sensitive     * >=100,000 COLONIES/mL PROTEUS MIRABILIS  Blood Cultures (routine x 2)     Status: None (Preliminary result)   Collection Time: 12/21/17  7:46 PM  Result Value Ref Range Status   Specimen Description   Final    BLOOD RIGHT HAND Performed at [facility], [address], [place], [place] [address]    Special Requests   Final    BOTTLES DRAWN AEROBIC ONLY Blood Culture results may not be optimal due to an inadequate volume of blood received in culture bottles Performed at [facility], [address], [place], [place] [address]    Culture   Final    NO GROWTH 3 DAYS Performed at [facility], [address] [place], [place] [address]    Report Status PENDING  Incomplete  MRSA PCR Screening     Status: None   Collection Time: 12/21/17 10:49 PM  Result Value Ref Range Status    MRSA by PCR NEGATIVE NEGATIVE Final    Comment:        The GeneXpert MRSA Assay (FDA approved for NASAL specimens only), is one component of a comprehensive MRSA colonization surveillance program. It is not intended to diagnose MRSA infection nor to guide or monitor treatment for MRSA infections. Performed at [facility], [address] [place], [place] [address]      Labs: BNP (last 3 results) No results for input(s): BNP in the last 8760 hours. Basic Metabolic Panel: Recent Labs  Lab 12/21/17 1727 12/21/17 1757 12/22/17 0402  NA 141 142 145  K 4.1 4.1 4.1  CL 108 105 110  CO2  --  25 25  GLUCOSE 136* 134* 101*  BUN 30* 33* 30*  CREATININE 1.10 1.14 1.00  CALCIUM  --  9.2 9.0   Liver Function Tests: Recent Labs  Lab 12/21/17 1757 12/22/17 0402  AST 48* 47*  ALT 39 38  ALKPHOS 128* 106  BILITOT 0.8 0.9  PROT 7.9 7.0  ALBUMIN 2.9* 2.6*   No results for input(s): LIPASE, AMYLASE in the last 168 hours. Recent Labs  Lab 12/21/17 1757  AMMONIA 9   CBC: Recent Labs  Lab 12/21/17 1727 12/21/17 1757 12/22/17 0402 12/23/17 0405 12/24/17 0412  WBC  --  31.7* 26.1* 16.2* 11.9*  NEUTROABS  --  26.6*  --  11.7* 8.4*  HGB 13.9 13.6 13.6 12.3* 13.1  HCT 41.0 41.1 40.9 38.2* 40.5  MCV  --  84.0 83.6 85.1 85.1  PLT  --  247 228 250 278   Cardiac Enzymes: Recent Labs  Lab 12/21/17 1757  TROPONINI <0.03   BNP: Invalid input(s): POCBNP CBG: Recent Labs  Lab 12/23/17 1145 12/23/17 1653 12/23/17 2123 12/24/17 0727 12/24/17 1211  GLUCAP 245* 191* 200* 166* 194*   D-Dimer No results for input(s): DDIMER in the last 72 hours. Hgb A1c No results for input(s): HGBA1C in the last 72 hours. Lipid Profile No results for input(s): CHOL, HDL, LDLCALC, TRIG, CHOLHDL, LDLDIRECT in the last 72 hours. Thyroid function studies Recent Labs    12/21/17 2213  TSH 1.348   Anemia  work up No results for input(s): VITAMINB12, FOLATE,  FERRITIN, TIBC, IRON, RETICCTPCT in the last 72 hours. Urinalysis    Component Value Date/Time   COLORURINE AMBER (A) 12/21/2017 1757   APPEARANCEUR HAZY (A) 12/21/2017 1757   LABSPEC 1.018 12/21/2017 1757   PHURINE 5.0 12/21/2017 1757   GLUCOSEU NEGATIVE 12/21/2017 1757   HGBUR LARGE (A) 12/21/2017 1757   BILIRUBINUR NEGATIVE 12/21/2017 1757   BILIRUBINUR n 03/26/2015 1025   KETONESUR NEGATIVE 12/21/2017 1757   PROTEINUR 30 (A) 12/21/2017 1757   UROBILINOGEN 0.2 03/26/2015 1025   UROBILINOGEN 0.2 05/29/2013 0216   NITRITE NEGATIVE 12/21/2017 1757   LEUKOCYTESUR MODERATE (A) 12/21/2017 1757   Sepsis Labs Invalid input(s): PROCALCITONIN,  WBC,  LACTICIDVEN Microbiology Recent Results (from the past 240 hour(s))  Blood Cultures (routine x 2)     Status: None (Preliminary result)   Collection Time: 12/21/17  5:28 PM  Result Value Ref Range Status   Specimen Description   Final    BLOOD LEFT ARM UPPER Performed at [facility], [address] [place], [place] [address]    Special Requests   Final    BOTTLES DRAWN AEROBIC AND ANAEROBIC Blood Culture adequate volume Performed at [facility], [address] [place], [place] [address]    Culture   Final    NO GROWTH 3 DAYS Performed at [facility], [address] [place], [place] [address]    Report Status PENDING  Incomplete  Urine culture     Status: Abnormal   Collection Time: 12/21/17  5:57 PM  Result Value Ref Range Status   Specimen Description   Final    URINE, CLEAN CATCH Performed at [facility], [address] [place], [place] [address]    Special Requests   Final    NONE Performed at [facility], [address], [place], [place] [address]    Culture >=100,000 COLONIES/mL PROTEUS MIRABILIS (A)  Final   Report Status 12/24/2017 FINAL  Final   Organism ID, Bacteria PROTEUS MIRABILIS (A)  Final      Susceptibility   Proteus  mirabilis - MIC*    AMPICILLIN <=2 SENSITIVE Sensitive     CEFAZOLIN <=4 SENSITIVE Sensitive     CEFTRIAXONE <=1 SENSITIVE Sensitive     CIPROFLOXACIN >=4 RESISTANT Resistant     GENTAMICIN <=1 SENSITIVE Sensitive     IMIPENEM 4 SENSITIVE Sensitive     NITROFURANTOIN 128 RESISTANT Resistant     TRIMETH/SULFA 40 SENSITIVE Sensitive     AMPICILLIN/SULBACTAM <=2 SENSITIVE Sensitive     PIP/TAZO <=4 SENSITIVE Sensitive     * >=100,000 COLONIES/mL PROTEUS MIRABILIS  Blood Cultures (routine x 2)     Status: None (Preliminary result)   Collection Time: 12/21/17  7:46 PM  Result Value Ref Range Status   Specimen Description   Final    BLOOD RIGHT HAND Performed at [facility], [address], [place], [place] [address]    Special Requests   Final    BOTTLES DRAWN AEROBIC ONLY Blood Culture results may not be optimal due to an inadequate volume of blood received in culture bottles Performed at [facility], [address] [place], [place] [address]    Culture   Final    NO GROWTH 3 DAYS Performed at [facility], [address], [place], [place] [address]    Report Status PENDING  Incomplete  MRSA PCR Screening  Status: None   Collection Time: 12/21/17 10:49 PM  Result Value Ref Range Status   MRSA by PCR NEGATIVE NEGATIVE Final    Comment:        The GeneXpert MRSA Assay (FDA approved for NASAL specimens only), is one component of a comprehensive MRSA colonization surveillance program. It is not intended to diagnose MRSA infection nor to guide or monitor treatment for MRSA infections. Performed at [facility], [address] [place] [address], [place], [place] [address]     Please note: You were cared for by a hospitalist during your hospital stay. Once you are discharged, your primary care physician will handle any further medical issues. Please note that NO REFILLS for any discharge medications will be authorized once you are  discharged, as it is imperative that you return to your primary care physician (or establish a relationship with a primary care physician if you do not have one) for your post hospital discharge needs so that they can reassess your need for medications and monitor your lab values.    Time coordinating discharge: 40 minutes  SIGNED:   Burnadette Pop, MD  [facility] 12/24/2017, 3:01 PM Pager [phone]  If 7PM-7AM, please contact night-coverage [email or web address] Password [email or web address]

## 2017-12-24 NOTE — Progress Notes (Signed)
CSW following to assist with discharge planning. [facility], staff member Murvin NatalShawnna reported that they feel they are not able to meet patient's needs. Palliative consulted to speak with patient/patient's family regarding goals of care. Per chart review, palliative NP recommended that patient return to ALF with hospice.  CSW contacted [facility] to see if they feel they are able to meet patient's needs with hospice care following. CSW awaiting return call from staff member Murvin NatalShawnna.  CSW followed up with patient's daughter Loney Loh([name] Lovett) regarding patient's discharge plans and palliative recommendation. CSW informed patient's daughter that [facility] that [facility] feel they are unable to meet patient's care needs and inquired about other discharge plans for patient. Patient's daughter reported that perhaps [facility]. CSW explained that it would be the same level of care being that they are both ALFs. CSW inquired about patient's payor source for facility placement.  Patient's daughter reported that there is none, noting patient can no longer private pay for [facility] and that she applied for Medicaid. CSW asked for patient's Medicaid pending ID number, patient's daughter reported that she does not have one and [facility].  CSW awaiting return call from [facility] to see if they feel they are able to meet patient's care needs with hospice following.  Patient's daughter reporting that patient does not have a payor source for facility placement.  Patient's disposition uncertain at this time.  Celso SickleKimberly Trejon Duford, [place] Clinical Social Worker [facility] Cell#: [phone]

## 2017-12-24 NOTE — Progress Notes (Signed)
CSW received return call from [facility] staff member [place]. CSW provided update about patient's care and palliative recommendation to return to ALF with hospice care services. Staff agreed to speak with director about patient's ability to return and to call CSW with an update.  CSW received return call from [facility] staff member Murvin NatalShawnna and informed that patient's daughter informed them that she is no longer able to afford ALF and that she wants patient to discharge to SNF. Staff reported that they cannot accept patient back with patient's current care needs and no payor source.   CSW contacted patient's daughter Loney Loh([name] Lovett [phone]) to discuss patient's discharge plans. CSW provided update from [facility]. Patient's daughter reported that she wants patient to dc to SNF. CSW explained payor source for [facility] term care at SNF, private pay or [place] term care medicaid. CSW informed patient's daughter that sometimes SNFs will accept patient medicaid pending and inquired about patient's income. Patient's daughter reported that patient receives approx. (1100 or 1200) in SSI monthly. CSW inquired if patient's daughter had a pending medicaid ID number. Patient's [place] [facility] [facility] [place] and would follow up with CSW. CSW informed patient's daughter that patient has a discharge order and asked if patient was able to discharge home with daughter until patient had a payor source for SNF. Patient's daughter reported that she does not stay locally anymore and does not have the means to care for patient in the home. Patient's daughter inquired if patient was able to [facility] and then transition to SNF. CSW explained to patient's daughter that [facility] reported that patient no longer has a payor source for ALF and they cannot accept patient back. Patient's daughter reported that she would call CSW back after [place] [facility].  Patient currently does not have a disposition plan.  No Payor Source for facility placement.  CSW awaiting return call from patient's daughter with information about patient's pending medicaid application.   Celso SickleKimberly Lorien Shingler, [place] Clinical Social Worker [facility] Cell#: [phone]

## 2017-12-24 NOTE — Progress Notes (Signed)
Pt refuses to take medication crushed in ice cream. Pt pushes RN hand away and states "NO I dont want it".

## 2017-12-25 LAB — GLUCOSE, CAPILLARY
GLUCOSE-CAPILLARY: 150 mg/dL — AB (ref 70–99)
GLUCOSE-CAPILLARY: 234 mg/dL — AB (ref 70–99)
GLUCOSE-CAPILLARY: 172 mg/dL — AB (ref 70–99)
Glucose-Capillary: 278 mg/dL — ABNORMAL HIGH (ref 70–99)

## 2017-12-25 NOTE — Progress Notes (Signed)
PROGRESS NOTE    William Aguirre  ZOX:[id number] DOB: [birth year] DOA: 12/21/2017 PCP: Shirline Frees, NP   Brief Narrative: Patient is a 83 year old male with medical history significant ofHTN, HLD, DM type 2, CAD, UTIs, and dementia; who presented for approximately 10 days of altered mental status.[facility]History mostly obtained from review of recordsand grandaughter at bedside. It was reported that patient fell at the nursing facility. Patient has a bruise on his left hip. Patient was found to have leukocytosis, mild fever on presentation. UA was suggestive of urinary tract infection. Urine culture showed Proteus.  He was being managed for  UTI and encephalopathy.  Mental status significantly improved during the hospitalization and is currently on baseline.  He is hemodynamically stable.  His oral intake has improved.  Palliative care also worked with the patient regarding goals of care, possible hospice initiation.  After discussion with the daughter and patient , patient was made DNR.  Plan was to follow-up with hospice as an outpatient on discharge. Patient is medically stable for discharge but  [facility] is refusing to take him back.  We are working on placement.  Social Secretary/administrator.  Assessment & Plan:   Principal Problem:   UTI (urinary tract infection) Active Problems:   Diabetes mellitus (HCC)   Essential hypertension   CORONARY ATHEROSCLEROSIS NATIVE CORONARY ARTERY   Acute metabolic encephalopathy   Dehydration   Malnutrition (HCC)   Dementia without behavioral disturbance  Urinary tract infection with hematuria: Patient found to have urinalysis concerning for infection. Previous history of enterococcus faecalis that was sensitive to ampicillin, nitrofurantoin, and vancomycin.Patient was started  on Rocephin. Urine culture showed pansensitive Proteus. Blood cultureNGTD Patient also  hadsignificant leukocytosis which has significantly improved. He is currently hemodynamically stable. Antibiotics changed to Keflex .  Acutemetabolic encephalopathy:Much improved today.he is awake, alert and communicative today.Oriented to place and person.Hepresented withaltered with decreased p.o. intake. CT imaging of the brain showed no acute abnormalities but global atrophy. Patient has history of underlying dementia.  Encephalopathy was secondary to UTI which has resolved.  Dehydration:Started on IV 75 mL/h.  Oral intake has much improved.IV fluid discontinued.  Dementia:Continue Aricept  Diabetes mellitus type 2:Last hemoglobin A1c 8.7 on 08/27/2017. Patient is followed in the outpatient setting by Dr. Tyron Russell. Resume home medications on discharge.  Malnutrition: Initial albumin 2.9 on admission. Patient with decreased p.o. intake over the last 2 days.Lowprealbumin .   Nutrition evaluated him during the hospitalization.  Essential hypertension: Continue bisoprolol and losartan.  Noted to be hypertensive.  Increase the dose of losartan 200 mg daily.  CAD: Continue aspirin, Plavix, and statin  Depression: Continue Zoloft  Hyperlipidemia Continue atorvastatin  Fall: Reported to have fallen at [place]. Complains of pain on the left hip. He has bruise on the left hip. Imaging did not show any fracture dislocation. Continue supportive care, pain management. Patient is a ALF resident. PT evaluated and recommended skilled nursing facility on discharge. Social worker consulted and assisting.  Advanced age/dementia/debility:We had requested for palliative care evaluation due to his multiple comorbidities, advanced age, dementia and decreased oral intake .  After discussion with the daughter and the patient himself , cholecystitis changed to DNR.  Palliative care recommended to follow-up with hospice after discharge.     DVT prophylaxis:  Lovenox Code Status: DNR Family Communication: None present at the bedside Disposition Plan:    Consultants: None  Procedures: None  Antimicrobials: Keflex  Subjective: Patient seen and  examined the bedside this morning.  Remains comfortable.  Hemodynamically stable.  No new issues.  Objective: Vitals:   12/24/17 0438 12/24/17 1500 12/24/17 2220 12/25/17 0633  BP: (!) 175/91 (!) 141/81 (!) 153/83 (!) 178/76  Pulse: 66 67 72 61  Resp: 18 18 18 16   Temp: 97.9 F (36.6 C) 98.3 F (36.8 C) 99.2 F (37.3 C) 98 F (36.7 C)  TempSrc:   Oral   SpO2: 94% 97% 100% 98%  Weight:      Height:        Intake/Output Summary (Last 24 hours) at 12/25/2017 1109 Last data filed at 12/25/2017 0100 Gross per 24 hour  Intake 1127.95 ml  Output 1000 ml  Net 127.95 ml   Filed Weights   12/22/17 0101  Weight: 83.6 kg    Examination:  General exam: Appears calm and comfortable ,Not in distress,average built HEENT:PERRL,Oral mucosa moist, Ear/Nose normal on gross exam Respiratory system: Bilateral equal air entry, normal vesicular breath sounds, no wheezes or crackles  Cardiovascular system: S1 & S2 heard, RRR. No JVD, murmurs, rubs, gallops or clicks. No pedal edema. Gastrointestinal system: Abdomen is nondistended, soft and nontender. No organomegaly or masses felt. Normal bowel sounds heard. Central nervous system: Alert and oriented to place and person.  Extremities: No edema, no clubbing ,no cyanosis, distal peripheral pulses palpable. Skin: No rashes, lesions or ulcers,no icterus ,no pallor  Data Reviewed: I have personally reviewed following labs and imaging studies  CBC: Recent Labs  Lab 12/21/17 1727 12/21/17 1757 12/22/17 0402 12/23/17 0405 12/24/17 0412  WBC  --  31.7* 26.1* 16.2* 11.9*  NEUTROABS  --  26.6*  --  11.7* 8.4*  HGB 13.9 13.6 13.6 12.3* 13.1  HCT 41.0 41.1 40.9 38.2* 40.5  MCV  --  84.0 83.6 85.1 85.1  PLT  --  247 228 250 278   Basic Metabolic  Panel: Recent Labs  Lab 12/21/17 1727 12/21/17 1757 12/22/17 0402  NA 141 142 145  K 4.1 4.1 4.1  CL 108 105 110  CO2  --  25 25  GLUCOSE 136* 134* 101*  BUN 30* 33* 30*  CREATININE 1.10 1.14 1.00  CALCIUM  --  9.2 9.0   GFR: Estimated Creatinine Clearance: 61.7 mL/min (by C-G formula based on SCr of 1 mg/dL). Liver Function Tests: Recent Labs  Lab 12/21/17 1757 12/22/17 0402  AST 48* 47*  ALT 39 38  ALKPHOS 128* 106  BILITOT 0.8 0.9  PROT 7.9 7.0  ALBUMIN 2.9* 2.6*   No results for input(s): LIPASE, AMYLASE in the last 168 hours. Recent Labs  Lab 12/21/17 1757  AMMONIA 9   Coagulation Profile: No results for input(s): INR, PROTIME in the last 168 hours. Cardiac Enzymes: Recent Labs  Lab 12/21/17 1757  TROPONINI <0.03   BNP (last 3 results) No results for input(s): PROBNP in the last 8760 hours. HbA1C: No results for input(s): HGBA1C in the last 72 hours. CBG: Recent Labs  Lab 12/24/17 0727 12/24/17 1211 12/24/17 1647 12/24/17 2220 12/25/17 0748  GLUCAP 166* 194* 227* 184* 150*   Lipid Profile: No results for input(s): CHOL, HDL, LDLCALC, TRIG, CHOLHDL, LDLDIRECT in the last 72 hours. Thyroid Function Tests: No results for input(s): TSH, T4TOTAL, FREET4, T3FREE, THYROIDAB in the last 72 hours. Anemia Panel: No results for input(s): VITAMINB12, FOLATE, FERRITIN, TIBC, IRON, RETICCTPCT in the last 72 hours. Sepsis Labs: Recent Labs  Lab 12/21/17 1728  LATICACIDVEN 1.48    Recent Results (from the  past 240 hour(s))  Blood Cultures (routine x 2)     Status: None (Preliminary result)   Collection Time: 12/21/17  5:28 PM  Result Value Ref Range Status   Specimen Description   Final    BLOOD LEFT ARM UPPER Performed at [facility], [address] [place], [place] [address]    Special Requests   Final    BOTTLES DRAWN AEROBIC AND ANAEROBIC Blood Culture adequate volume Performed at [facility], [address] [place], [place] [address]    Culture   Final    NO GROWTH 4 DAYS Performed at [facility], [address] [place], [place] [address]    Report Status PENDING  Incomplete  Urine culture     Status: Abnormal   Collection Time: 12/21/17  5:57 PM  Result Value Ref Range Status   Specimen Description   Final    URINE, CLEAN CATCH Performed at [facility], [address], [place], [place] [address]    Special Requests   Final    NONE Performed at [facility], [address], [place], [place] [address]    Culture >=100,000 COLONIES/mL PROTEUS MIRABILIS (A)  Final   Report Status 12/24/2017 FINAL  Final   Organism ID, Bacteria PROTEUS MIRABILIS (A)  Final      Susceptibility   Proteus mirabilis - MIC*    AMPICILLIN <=2 SENSITIVE Sensitive     CEFAZOLIN <=4 SENSITIVE Sensitive     CEFTRIAXONE <=1 SENSITIVE Sensitive     CIPROFLOXACIN >=4 RESISTANT Resistant     GENTAMICIN <=1 SENSITIVE Sensitive     IMIPENEM 4 SENSITIVE Sensitive     NITROFURANTOIN 128 RESISTANT Resistant     TRIMETH/SULFA 40 SENSITIVE Sensitive     AMPICILLIN/SULBACTAM <=2 SENSITIVE Sensitive     PIP/TAZO <=4 SENSITIVE Sensitive     * >=100,000 COLONIES/mL PROTEUS MIRABILIS  Blood Cultures (routine x 2)     Status: None (Preliminary result)   Collection Time: 12/21/17  7:46 PM  Result Value Ref Range Status   Specimen Description   Final    BLOOD RIGHT HAND Performed at [facility], [address], [place], [place] [address]    Special Requests   Final    BOTTLES DRAWN AEROBIC ONLY Blood Culture results may not be optimal due to an inadequate volume of blood received in culture bottles Performed at [facility], [address] [place], [place] [address]    Culture   Final    NO GROWTH 4 DAYS Performed at [facility], [address], [place], [place] [address]    Report Status PENDING  Incomplete  MRSA PCR  Screening     Status: None   Collection Time: 12/21/17 10:49 PM  Result Value Ref Range Status   MRSA by PCR NEGATIVE NEGATIVE Final    Comment:        The GeneXpert MRSA Assay (FDA approved for NASAL specimens only), is one component of a comprehensive MRSA colonization surveillance program. It is not intended to diagnose MRSA infection nor to guide or monitor treatment for MRSA infections. Performed at [facility], [address], [place], [place] [address]          Radiology Studies: No results found.      Scheduled Meds: . aspirin EC  81 mg Oral Daily  . atorvastatin  80 mg Oral q1800  . bisoprolol  5  mg Oral Daily  . cephALEXin  500 mg Oral Q12H  . clopidogrel  75 mg Oral Q supper  . donepezil  10 mg Oral QHS  . enoxaparin (LOVENOX) injection  40 mg Subcutaneous Q24H  . feeding supplement (ENSURE ENLIVE)  237 mL Oral BID BM  . insulin aspart  0-9 Units Subcutaneous TID WC  . losartan  100 mg Oral Daily  . losartan  50 mg Oral Once  . pantoprazole  40 mg Oral Daily  . sertraline  50 mg Oral QHS  . sodium chloride flush  3 mL Intravenous Q12H   Continuous Infusions:   LOS: 3 days    Time spent: 25 mins.More than 50% of that time was spent in counseling and/or coordination of care.      Burnadette Pop, MD [facility] Pager [phone]  If 7PM-7AM, please contact night-coverage [email or web address] Password [email or web address] 12/25/2017, 11:09 AM

## 2017-12-25 NOTE — Plan of Care (Signed)
VSS, patient tolerating regular diet, drinking ample amounts of fluids, eating 25-50% of meals. Up to chair with 2 max assist, stayed in chair for several hours then assisted back to bed.  Patient continues to refuse majority of medications, did convince him that he must take his antibiotic which he did.

## 2017-12-26 LAB — CULTURE, BLOOD (ROUTINE X 2)
CULTURE: NO GROWTH
Culture: NO GROWTH
Special Requests: ADEQUATE

## 2017-12-26 LAB — GLUCOSE, CAPILLARY
GLUCOSE-CAPILLARY: 290 mg/dL — AB (ref 70–99)
GLUCOSE-CAPILLARY: 183 mg/dL — AB (ref 70–99)
Glucose-Capillary: 172 mg/dL — ABNORMAL HIGH (ref 70–99)
Glucose-Capillary: 261 mg/dL — ABNORMAL HIGH (ref 70–99)

## 2017-12-26 MED ORDER — BISOPROLOL FUMARATE 5 MG PO TABS
5.0000 mg | ORAL_TABLET | Freq: Two times a day (BID) | ORAL | Status: DC
  Administered 2017-12-26 – 2017-12-29 (×6): 5 mg via ORAL
  Filled 2017-12-26 (×5): qty 1

## 2017-12-26 NOTE — Plan of Care (Signed)
Patient without complaint, up to chair with 2 max assist and walker.  Patient tolerating diet, drinking Ensure.  Granddaughter at bedside.  Continues to refuse all medications but RN was able to convince him to take his po antibiotics this shift.

## 2017-12-26 NOTE — Progress Notes (Signed)
PROGRESS NOTE    William Aguirre  ZOX:[id number] DOB: 1934/06/04 DOA: 12/21/2017 PCP: Shirline Frees, NP   Brief Narrative: Patient is a 83 year old male with medical history significant ofHTN, HLD, DM type 2, CAD, UTIs, and dementia; who presented for approximately 10 days of altered mental status.[facility]History mostly obtained from review of recordsand grandaughter at bedside. It was reported that patient fell at the nursing facility. Patient has a bruise on his left hip. Patient was found to have leukocytosis, mild fever on presentation. UA was suggestive of urinary tract infection. Urine culture showed Proteus.  He was being managed for  UTI and encephalopathy.  Mental status significantly improved during the hospitalization and is currently on baseline.  He is hemodynamically stable.  His oral intake has improved.  Palliative care also worked with the patient regarding goals of care, possible hospice initiation.  After discussion with the daughter and patient , patient was made DNR.  Plan was to follow-up with hospice as an outpatient on discharge. Patient is medically stable for discharge but  [facility] is refusing to take him back.  We are working on placement.  Social Secretary/administrator.  Assessment & Plan:   Principal Problem:   UTI (urinary tract infection) Active Problems:   Diabetes mellitus (HCC)   Essential hypertension   CORONARY ATHEROSCLEROSIS NATIVE CORONARY ARTERY   Acute metabolic encephalopathy   Dehydration   Malnutrition (HCC)   Dementia without behavioral disturbance  Urinary tract infection with hematuria: Patient found to have urinalysis concerning for infection. Previous history of enterococcus faecalis that was sensitive to ampicillin, nitrofurantoin, and vancomycin.Patient was started  on Rocephin. Urine culture showed pansensitive Proteus. Blood cultureNGTD Patient also  hadsignificant leukocytosis which has significantly improved. He is currently hemodynamically stable. Antibiotics changed to Keflex .  Acutemetabolic encephalopathy:Much improved today.he is awake, alert and communicative today.Oriented to place and person.Hepresented withaltered with decreased p.o. intake. CT imaging of the brain showed no acute abnormalities but global atrophy. Patient has history of underlying dementia.  Encephalopathy was secondary to UTI which has resolved.  Dehydration:Started on IV 75 mL/h.  Oral intake has much improved.IV fluid discontinued.  Dementia:Continue Aricept  Diabetes mellitus type 2:Last hemoglobin A1c 8.7 on 08/27/2017. Patient is followed in the outpatient setting by Dr. Tyron Russell. Resume home medications on discharge.  Malnutrition: Initial albumin 2.9 on admission. Patient with decreased p.o. intake over the last 2 days.Lowprealbumin .   Nutrition evaluated him during the hospitalization.  Essential hypertension: Continue bisoprolol and losartan.  Noted to be hypertensive.  Increase the dose of losartan 200 mg daily.  CAD: Continue aspirin, Plavix, and statin  Depression: Continue Zoloft  Hyperlipidemia Continue atorvastatin  Fall: Reported to have fallen at [place]. Complains of pain on the left hip. He has bruise on the left hip. Imaging did not show any fracture dislocation. Continue supportive care, pain management. Patient is a ALF resident. PT evaluated and recommended skilled nursing facility on discharge. Social worker consulted and assisting.  Advanced age/dementia/debility:We had requested for palliative care evaluation due to his multiple comorbidities, advanced age, dementia and decreased oral intake .  After discussion with the daughter and the patient himself , cholecystitis changed to DNR.  Palliative care recommended to follow-up with hospice after discharge.     DVT prophylaxis:  Lovenox Code Status: DNR Family Communication: None present at the bedside Disposition Plan:    Consultants: None  Procedures: None  Antimicrobials: Keflex  Subjective: Patient seen and  examined the bedside this morning.  Is hemodynamically stable.  Comfortable.  No new issues.  Waiting for placement.  Objective: Vitals:   12/25/17 0633 12/25/17 1418 12/25/17 2157 12/26/17 0539  BP: (!) 178/76 134/79 (!) 167/79 (!) 186/74  Pulse: 61 73 73 76  Resp: 16  18 18   Temp: 98 F (36.7 C) 99 F (37.2 C) 98.2 F (36.8 C) 98.1 F (36.7 C)  TempSrc:  Oral Oral Oral  SpO2: 98% 97% 99% 99%  Weight:      Height:       No intake or output data in the 24 hours ending 12/26/17 1119 Filed Weights   12/22/17 0101  Weight: 83.6 kg    Examination:  General exam: Appears calm and comfortable ,Not in distress,average built HEENT:PERRL,Oral mucosa moist, Ear/Nose normal on gross exam Respiratory system: Bilateral equal air entry, normal vesicular breath sounds, no wheezes or crackles  Cardiovascular system: S1 & S2 heard, RRR. No JVD, murmurs, rubs, gallops or clicks. No pedal edema. Gastrointestinal system: Abdomen is nondistended, soft and nontender. No organomegaly or masses felt. Normal bowel sounds heard. Central nervous system: Alert and oriented to place and person.  Extremities: No edema, no clubbing ,no cyanosis, distal peripheral pulses palpable. Skin: No rashes, lesions or ulcers,no icterus ,no pallor  Data Reviewed: I have personally reviewed following labs and imaging studies  CBC: Recent Labs  Lab 12/21/17 1727 12/21/17 1757 12/22/17 0402 12/23/17 0405 12/24/17 0412  WBC  --  31.7* 26.1* 16.2* 11.9*  NEUTROABS  --  26.6*  --  11.7* 8.4*  HGB 13.9 13.6 13.6 12.3* 13.1  HCT 41.0 41.1 40.9 38.2* 40.5  MCV  --  84.0 83.6 85.1 85.1  PLT  --  247 228 250 278   Basic Metabolic Panel: Recent Labs  Lab 12/21/17 1727 12/21/17 1757 12/22/17 0402  NA 141 142 145  K  4.1 4.1 4.1  CL 108 105 110  CO2  --  25 25  GLUCOSE 136* 134* 101*  BUN 30* 33* 30*  CREATININE 1.10 1.14 1.00  CALCIUM  --  9.2 9.0   GFR: Estimated Creatinine Clearance: 61.7 mL/min (by C-G formula based on SCr of 1 mg/dL). Liver Function Tests: Recent Labs  Lab 12/21/17 1757 12/22/17 0402  AST 48* 47*  ALT 39 38  ALKPHOS 128* 106  BILITOT 0.8 0.9  PROT 7.9 7.0  ALBUMIN 2.9* 2.6*   No results for input(s): LIPASE, AMYLASE in the last 168 hours. Recent Labs  Lab 12/21/17 1757  AMMONIA 9   Coagulation Profile: No results for input(s): INR, PROTIME in the last 168 hours. Cardiac Enzymes: Recent Labs  Lab 12/21/17 1757  TROPONINI <0.03   BNP (last 3 results) No results for input(s): PROBNP in the last 8760 hours. HbA1C: No results for input(s): HGBA1C in the last 72 hours. CBG: Recent Labs  Lab 12/25/17 0748 12/25/17 1209 12/25/17 1749 12/25/17 2155 12/26/17 0759  GLUCAP 150* 234* 278* 172* 172*   Lipid Profile: No results for input(s): CHOL, HDL, LDLCALC, TRIG, CHOLHDL, LDLDIRECT in the last 72 hours. Thyroid Function Tests: No results for input(s): TSH, T4TOTAL, FREET4, T3FREE, THYROIDAB in the last 72 hours. Anemia Panel: No results for input(s): VITAMINB12, FOLATE, FERRITIN, TIBC, IRON, RETICCTPCT in the last 72 hours. Sepsis Labs: Recent Labs  Lab 12/21/17 1728  LATICACIDVEN 1.48    Recent Results (from the past 240 hour(s))  Blood Cultures (routine x 2)     Status: None (Preliminary result)  Collection Time: 12/21/17  5:28 PM  Result Value Ref Range Status   Specimen Description   Final    BLOOD LEFT ARM UPPER Performed at [facility], [address] [place], [place] [address]    Special Requests   Final    BOTTLES DRAWN AEROBIC AND ANAEROBIC Blood Culture adequate volume Performed at [facility], [address] [place], [place] [address]    Culture   Final    NO GROWTH 4 DAYS Performed at  [facility], [address] [place], [place] [address]    Report Status PENDING  Incomplete  Urine culture     Status: Abnormal   Collection Time: 12/21/17  5:57 PM  Result Value Ref Range Status   Specimen Description   Final    URINE, CLEAN CATCH Performed at [facility], [address] [place], [place] [address]    Special Requests   Final    NONE Performed at [facility], [address] [place], [place] [address]    Culture >=100,000 COLONIES/mL PROTEUS MIRABILIS (A)  Final   Report Status 12/24/2017 FINAL  Final   Organism ID, Bacteria PROTEUS MIRABILIS (A)  Final      Susceptibility   Proteus mirabilis - MIC*    AMPICILLIN <=2 SENSITIVE Sensitive     CEFAZOLIN <=4 SENSITIVE Sensitive     CEFTRIAXONE <=1 SENSITIVE Sensitive     CIPROFLOXACIN >=4 RESISTANT Resistant     GENTAMICIN <=1 SENSITIVE Sensitive     IMIPENEM 4 SENSITIVE Sensitive     NITROFURANTOIN 128 RESISTANT Resistant     TRIMETH/SULFA 40 SENSITIVE Sensitive     AMPICILLIN/SULBACTAM <=2 SENSITIVE Sensitive     PIP/TAZO <=4 SENSITIVE Sensitive     * >=100,000 COLONIES/mL PROTEUS MIRABILIS  Blood Cultures (routine x 2)     Status: None (Preliminary result)   Collection Time: 12/21/17  7:46 PM  Result Value Ref Range Status   Specimen Description   Final    BLOOD RIGHT HAND Performed at [facility], [address] [place], [place] [address]    Special Requests   Final    BOTTLES DRAWN AEROBIC ONLY Blood Culture results may not be optimal due to an inadequate volume of blood received in culture bottles Performed at [facility], [address] [place], [place] [address]    Culture   Final    NO GROWTH 4 DAYS Performed at [facility], [address] [place], [place] [address]    Report Status PENDING  Incomplete  MRSA PCR Screening     Status: None   Collection Time: 12/21/17 10:49 PM  Result Value Ref Range  Status   MRSA by PCR NEGATIVE NEGATIVE Final    Comment:        The GeneXpert MRSA Assay (FDA approved for NASAL specimens only), is one component of a comprehensive MRSA colonization surveillance program. It is not intended to diagnose MRSA infection nor to guide or monitor treatment for MRSA infections. Performed at [facility], [address] [place], [place] [address]          Radiology Studies: No results found.      Scheduled Meds: . aspirin EC  81 mg Oral Daily  . atorvastatin  80 mg Oral q1800  . bisoprolol  5 mg Oral BID  . cephALEXin  500 mg Oral Q12H  . clopidogrel  75 mg Oral Q  supper  . donepezil  10 mg Oral QHS  . enoxaparin (LOVENOX) injection  40 mg Subcutaneous Q24H  . feeding supplement (ENSURE ENLIVE)  237 mL Oral BID BM  . insulin aspart  0-9 Units Subcutaneous TID WC  . losartan  100 mg Oral Daily  . losartan  50 mg Oral Once  . pantoprazole  40 mg Oral Daily  . sertraline  50 mg Oral QHS  . sodium chloride flush  3 mL Intravenous Q12H   Continuous Infusions:   LOS: 4 days    Time spent: 25 mins.     Burnadette Pop, MD [facility] Pager [phone]  If 7PM-7AM, please contact night-coverage [email or web address] Password [email or web address] 12/26/2017, 11:19 AM

## 2017-12-27 LAB — GLUCOSE, CAPILLARY
GLUCOSE-CAPILLARY: 146 mg/dL — AB (ref 70–99)
GLUCOSE-CAPILLARY: 148 mg/dL — AB (ref 70–99)
Glucose-Capillary: 177 mg/dL — ABNORMAL HIGH (ref 70–99)
Glucose-Capillary: 283 mg/dL — ABNORMAL HIGH (ref 70–99)

## 2017-12-27 MED ORDER — INSULIN DETEMIR 100 UNIT/ML ~~LOC~~ SOLN
10.0000 [IU] | Freq: Every day | SUBCUTANEOUS | Status: DC
  Administered 2017-12-27 – 2017-12-29 (×3): 10 [IU] via SUBCUTANEOUS
  Filled 2017-12-27 (×4): qty 0.1

## 2017-12-27 NOTE — Progress Notes (Signed)
CSW following to assist with patient's discharge planning. [facility] and is unable to return due to patient's higher level of care needs.  Patient's daughter provided CSW with patient's Medicaid pending ID number [id number] and reported that she thinks patient's Medicaid worker is [employer]. CSW provided patient's daughter with SNF options with patient's Medicaid pending status. Patient's daughter replied only two options and inquired about a memory care unit (ALF) for patient. CSW explained that SNF level of care is the recommendation due to patient's high level of care needs. Patient's daughter inquired about SNF options with one month of private pay. CSW agreed to follow up with local SNFs to see what was available for patient and to get quotes for patient's daughter.   CSW will reach out to local SNFs. CSW will continue to follow and assist with discharge planning.  Celso SickleKimberly Temperance Aguirre, [place] Clinical Social Worker [facility] Cell#: [phone]

## 2017-12-27 NOTE — Progress Notes (Signed)
Patient took medication this AM but remains very confused, unsure even of his own birthday.  Oriented to name only.  Refuses to eat or drink anything and has been asleep for most of the day.  Will continue to monitor.

## 2017-12-27 NOTE — Care Management Important Message (Signed)
Important Message  Patient Details  Name: William Aguirre Null MRN: [id number] Date of Birth: 07/13/1934   Medicare Important Message Given:  Yes    Caren MacadamFuller, Kimmerly Lora 12/27/2017, 11:49 AMImportant Message  Patient Details  Name: William Aguirre Klunder MRN: [id number] Date of Birth: 07/13/1934   Medicare Important Message Given:  Yes    Caren MacadamFuller, Ambermarie Honeyman 12/27/2017, 11:49 AM

## 2017-12-27 NOTE — Progress Notes (Signed)
Physical Therapy Treatment Patient Details Name: William Aguirre MRN: [id number] DOB: 11-Feb-1935 Today's Date: 12/27/2017    History of Present Illness 83 yo male admitted with falls and AMS, H/O dementia, HTN, CAD, DM. patient resides at ALF.    PT Comments    The patient is cheerful and participated in  PT. Patient is noted with increased right leaning than lasdt visit. Marland Kitchen. Recommend Patient  Be getting up in recliner daily. Patient readily feeds self when sitting up in recliner. Continue PT.   Follow Up Recommendations  SNF     Equipment Recommendations  None recommended by PT    Recommendations for Other Services       Precautions / Restrictions Precautions Precautions: Fall    Mobility  Bed Mobility   Bed Mobility: Supine to Sit     Supine to sit: Mod assist     General bed mobility comments: extra time, multimodal cues for staying on task. Assist with hips.  Transfers Overall transfer level: Needs assistance Equipment used: Rolling walker (2 wheeled) Transfers: Sit to/from [employer];Stand Pivot Transfers Sit to Stand: Mod assist;+2 physical assistance;+2 safety/equipment         General transfer comment: [facility] listing to the right. Cues for posture. Assist to power up. Very small shuffle steps,decreasd weight shift to the left to take a step. to move feet to  transfer to recliner. Continued to lean to the right.  Ambulation/Gait                 Stairs             Wheelchair Mobility    Modified Rankin (Stroke Patients Only)       Balance Overall balance assessment: Needs assistance;History of Falls Sitting-balance support: Feet supported;Bilateral upper extremity supported Sitting balance-Leahy Scale: Poor Sitting balance - Comments: leaning to the right more today   Standing balance support: During functional activity;Bilateral upper extremity supported Standing balance-Leahy Scale: Poor                               Cognition Arousal/Alertness: Awake/alert Behavior During Therapy: WFL for tasks assessed/performed Overall Cognitive Status: No family/caregiver present to determine baseline cognitive functioning Area of Impairment: Orientation                 Orientation Level: Disoriented to;Time             General Comments: states at [employer], has not walk for awhile      Exercises      General Comments        Pertinent Vitals/Pain Faces Pain Scale: Hurts little more Pain Location: legs when standing Pain Descriptors / Indicators: Discomfort;Grimacing;Guarding Pain Intervention(s): Monitored during session    Home Living                      Prior Function            PT Goals (current goals can now be found in the care plan section) Progress towards PT goals: Progressing toward goals    Frequency    Min 2X/week      PT Plan Current plan remains appropriate    Co-evaluation              AM-PAC PT "6 Clicks" Daily Activity  Outcome Measure  Difficulty turning over in bed (including adjusting bedclothes, sheets and blankets)?: Unable Difficulty  moving from lying on back to sitting on the side of the bed? : Unable Difficulty sitting down on and standing up from a chair with arms (e.g., wheelchair, bedside commode, etc,.)?: Unable Help needed moving to and from a bed to chair (including a wheelchair)?: Total Help needed walking in hospital room?: Total Help needed climbing 3-5 steps with a railing? : Total 6 Click Score: 6    End of Session Equipment Utilized During Treatment: Gait belt Activity Tolerance: Patient tolerated treatment well Patient left: in chair;with call bell/phone within reach;with chair alarm set Nurse Communication: Mobility status PT Visit Diagnosis: Unsteadiness on feet (R26.81);History of falling (Z91.81);Adult, failure to thrive (R62.7)     Time: 1610-9604 PT Time Calculation (min) (ACUTE ONLY): 22  min  Charges:  $Therapeutic Activity: 8-22 mins                     William Aguirre PT Acute Rehabilitation Services Pager [phone] Office [phone]    Rada Hay 12/27/2017, 3:32 PM

## 2017-12-27 NOTE — Progress Notes (Signed)
PROGRESS NOTE    William Aguirre  ZOX:[id number] DOB: 08-17-1934 DOA: 12/21/2017 PCP: Shirline Frees, NP   Brief Narrative: Patient is a 83 year old male with medical history significant ofHTN, HLD, DM type 2, CAD, UTIs, and dementia; who presented for approximately 10 days of altered mental status.[facility]History mostly obtained from review of recordsand grandaughter at bedside. It was reported that patient fell at the nursing facility. Patient has a bruise on his left hip. Patient was found to have leukocytosis, mild fever on presentation. UA was suggestive of urinary tract infection. Urine culture showed Proteus.  He was being managed for  UTI and encephalopathy.  Mental status significantly improved during the hospitalization and is currently on baseline.  He is hemodynamically stable.  His oral intake has improved.  Palliative care also worked with the patient regarding goals of care, possible hospice initiation.  After discussion with the daughter and patient , patient was made DNR.  Plan was to follow-up with hospice as an outpatient on discharge. Patient is medically stable for discharge but  [facility] refusing [facility] back.  We are working on placement.  Social Secretary/administrator.  Assessment & Plan:   Principal Problem:   UTI (urinary tract infection) Active Problems:   Diabetes mellitus (HCC)   Essential hypertension   CORONARY ATHEROSCLEROSIS NATIVE CORONARY ARTERY   Acute metabolic encephalopathy   Dehydration   Malnutrition (HCC)   Dementia without behavioral disturbance  Urinary tract infection with hematuria: Patient found to have urinalysis concerning for infection. Previous history of enterococcus faecalis that was sensitive to ampicillin, nitrofurantoin, and vancomycin.Patient was started  on Rocephin. Urine culture showed pansensitive Proteus. Blood cultureNGTD Patient also  hadsignificant leukocytosis which has significantly improved. He is currently hemodynamically stable. Antibiotics changed to Keflex .  Acutemetabolic encephalopathy:Much improved today.he is awake, alert and communicative today.Oriented to place and person.Hepresented withaltered with decreased p.o. intake. CT imaging of the brain showed no acute abnormalities but global atrophy. Patient has history of underlying dementia.  Encephalopathy was secondary to UTI which has resolved.  Dehydration:Started on IV 75 mL/h.  Oral intake has much improved.IV fluid discontinued.  Dementia:Continue Aricept  Diabetes mellitus type 2:Last hemoglobin A1c 8.7 on 08/27/2017. Patient is followed in the outpatient setting by Dr. Tyron Russell. Resume home medications on discharge.  Malnutrition: Initial albumin 2.9 on admission. Patient with decreased p.o. intake over the last 2 days.Lowprealbumin .   Nutrition evaluated him during the hospitalization.  Essential hypertension: Continue bisoprolol and losartan.  Noted to be hypertensive.  Increase the dose of losartan 200 mg daily.  CAD: Continue aspirin, Plavix, and statin  Depression: Continue Zoloft  Hyperlipidemia Continue atorvastatin  Fall: Reported to have fallen at [place]. Complains of pain on the left hip. He has bruise on the left hip. Imaging did not show any fracture dislocation. Continue supportive care, pain management. Patient is a ALF resident. PT evaluated and recommended skilled nursing facility on discharge. Social worker consulted and assisting.  Advanced age/dementia/debility:We had requested for palliative care evaluation due to his multiple comorbidities, advanced age, dementia and decreased oral intake .  After discussion with the daughter and the patient himself , cholecystitis changed to DNR.  Palliative care recommended to follow-up with hospice after discharge.     DVT prophylaxis:  Lovenox Code Status: DNR Family Communication: None present at the bedside Disposition Plan: Needs to be determined.  Assisted living facility versus skilled nursing facility.  Social worker asisting.  He is stable for discharge as soon as the bed is available.   Consultants: None  Procedures: None  Antimicrobials: Keflex  Subjective: Seen and examined the bedside this morning.  No new issues.  Stable.  Waiting for placement.  Denies any complaints this morning.   Objective: Vitals:   12/26/17 0539 12/26/17 1259 12/26/17 2036 12/27/17 0416  BP: (!) 186/74 139/71 135/67 (!) 144/74  Pulse: 76 90 82 67  Resp: 18 16 16 16   Temp: 98.1 F (36.7 C) 97.8 F (36.6 C) 97.6 F (36.4 C) 98.1 F (36.7 C)  TempSrc: Oral Oral Oral Oral  SpO2: 99% 97% 99% 98%  Weight:      Height:        Intake/Output Summary (Last 24 hours) at 12/27/2017 1208 Last data filed at 12/27/2017 0600 Gross per 24 hour  Intake 760 ml  Output -  Net 760 ml   Filed Weights   12/22/17 0101  Weight: 83.6 kg    Examination:  General exam: Appears calm and comfortable ,Not in distress,average built HEENT:PERRL,Oral mucosa moist, Ear/Nose normal on gross exam Respiratory system: Bilateral equal air entry, normal vesicular breath sounds, no wheezes or crackles  Cardiovascular system: S1 & S2 heard, RRR. No JVD, murmurs, rubs, gallops or clicks. Gastrointestinal system: Abdomen is nondistended, soft and nontender. No organomegaly or masses felt. Normal bowel sounds heard. Central nervous system: Alert and oriented. No focal neurological deficits. Extremities: No edema, no clubbing ,no cyanosis, distal peripheral pulses palpable. Skin: No rashes, lesions or ulcers,no icterus ,no pallor MSK: Normal muscle bulk,tone ,power Psychiatry: Judgement and insight appear normal. Mood & affect appropriate.    Data Reviewed: I have personally reviewed following labs and imaging studies  CBC: Recent Labs  Lab  12/21/17 1727 12/21/17 1757 12/22/17 0402 12/23/17 0405 12/24/17 0412  WBC  --  31.7* 26.1* 16.2* 11.9*  NEUTROABS  --  26.6*  --  11.7* 8.4*  HGB 13.9 13.6 13.6 12.3* 13.1  HCT 41.0 41.1 40.9 38.2* 40.5  MCV  --  84.0 83.6 85.1 85.1  PLT  --  247 228 250 278   Basic Metabolic Panel: Recent Labs  Lab 12/21/17 1727 12/21/17 1757 12/22/17 0402  NA 141 142 145  K 4.1 4.1 4.1  CL 108 105 110  CO2  --  25 25  GLUCOSE 136* 134* 101*  BUN 30* 33* 30*  CREATININE 1.10 1.14 1.00  CALCIUM  --  9.2 9.0   GFR: Estimated Creatinine Clearance: 61.7 mL/min (by C-G formula based on SCr of 1 mg/dL). Liver Function Tests: Recent Labs  Lab 12/21/17 1757 12/22/17 0402  AST 48* 47*  ALT 39 38  ALKPHOS 128* 106  BILITOT 0.8 0.9  PROT 7.9 7.0  ALBUMIN 2.9* 2.6*   No results for input(s): LIPASE, AMYLASE in the last 168 hours. Recent Labs  Lab 12/21/17 1757  AMMONIA 9   Coagulation Profile: No results for input(s): INR, PROTIME in the last 168 hours. Cardiac Enzymes: Recent Labs  Lab 12/21/17 1757  TROPONINI <0.03   BNP (last 3 results) No results for input(s): PROBNP in the last 8760 hours. HbA1C: No results for input(s): HGBA1C in the last 72 hours. CBG: Recent Labs  Lab 12/26/17 0759 12/26/17 1206 12/26/17 1723 12/26/17 2032 12/27/17 0903  GLUCAP 172* 261* 290* 183* 146*   Lipid Profile: No results for input(s): CHOL, HDL, LDLCALC, TRIG, CHOLHDL, LDLDIRECT in the last 72 hours. Thyroid Function Tests: No results for input(s): TSH, T4TOTAL,  FREET4, T3FREE, THYROIDAB in the last 72 hours. Anemia Panel: No results for input(s): VITAMINB12, FOLATE, FERRITIN, TIBC, IRON, RETICCTPCT in the last 72 hours. Sepsis Labs: Recent Labs  Lab 12/21/17 1728  LATICACIDVEN 1.48    Recent Results (from the past 240 hour(s))  Blood Cultures (routine x 2)     Status: None   Collection Time: 12/21/17  5:28 PM  Result Value Ref Range Status   Specimen Description   Final     BLOOD LEFT ARM UPPER Performed at [facility], [address] [place], [place] [address]    Special Requests   Final    BOTTLES DRAWN AEROBIC AND ANAEROBIC Blood Culture adequate volume Performed at [facility], [address], [place], [place] [address]    Culture   Final    NO GROWTH 5 DAYS Performed at [facility], [address], [place], [place] [address]    Report Status 12/26/2017 FINAL  Final  Urine culture     Status: Abnormal   Collection Time: 12/21/17  5:57 PM  Result Value Ref Range Status   Specimen Description   Final    URINE, CLEAN CATCH Performed at [facility], [address] [place], [place] [address]    Special Requests   Final    NONE Performed at [facility], [address] [place], [place] [address]    Culture >=100,000 COLONIES/mL PROTEUS MIRABILIS (A)  Final   Report Status 12/24/2017 FINAL  Final   Organism ID, Bacteria PROTEUS MIRABILIS (A)  Final      Susceptibility   Proteus mirabilis - MIC*    AMPICILLIN <=2 SENSITIVE Sensitive     CEFAZOLIN <=4 SENSITIVE Sensitive     CEFTRIAXONE <=1 SENSITIVE Sensitive     CIPROFLOXACIN >=4 RESISTANT Resistant     GENTAMICIN <=1 SENSITIVE Sensitive     IMIPENEM 4 SENSITIVE Sensitive     NITROFURANTOIN 128 RESISTANT Resistant     TRIMETH/SULFA 40 SENSITIVE Sensitive     AMPICILLIN/SULBACTAM <=2 SENSITIVE Sensitive     PIP/TAZO <=4 SENSITIVE Sensitive     * >=100,000 COLONIES/mL PROTEUS MIRABILIS  Blood Cultures (routine x 2)     Status: None   Collection Time: 12/21/17  7:46 PM  Result Value Ref Range Status   Specimen Description   Final    BLOOD RIGHT HAND Performed at [facility], [address] [place], [place] [address]    Special Requests   Final    BOTTLES DRAWN AEROBIC ONLY Blood Culture results may not be optimal due to an inadequate volume of blood received in culture  bottles Performed at [facility], [address] [place], [place] [address]    Culture   Final    NO GROWTH 5 DAYS Performed at [facility], [address], [place], [place] [address]    Report Status 12/26/2017 FINAL  Final  MRSA PCR Screening     Status: None   Collection Time: 12/21/17 10:49 PM  Result Value Ref Range Status   MRSA by PCR NEGATIVE NEGATIVE Final    Comment:        The GeneXpert MRSA Assay (FDA approved for NASAL specimens only), is one component of a comprehensive MRSA colonization surveillance program. It is not intended to diagnose MRSA infection nor to guide or monitor treatment for MRSA infections. Performed at [facility], [address], [place], [place] [address]  Radiology Studies: No results found.      Scheduled Meds: . aspirin EC  81 mg Oral Daily  . atorvastatin  80 mg Oral q1800  . bisoprolol  5 mg Oral BID  . cephALEXin  500 mg Oral Q12H  . clopidogrel  75 mg Oral Q supper  . donepezil  10 mg Oral QHS  . enoxaparin (LOVENOX) injection  40 mg Subcutaneous Q24H  . feeding supplement (ENSURE ENLIVE)  237 mL Oral BID BM  . insulin aspart  0-9 Units Subcutaneous TID WC  . insulin detemir  10 Units Subcutaneous Daily  . losartan  100 mg Oral Daily  . losartan  50 mg Oral Once  . pantoprazole  40 mg Oral Daily  . sertraline  50 mg Oral QHS  . sodium chloride flush  3 mL Intravenous Q12H   Continuous Infusions:   LOS: 5 days    Time spent: 25 mins.     Burnadette PopAmrit Lundy Cozart, MD [facility] Pager [phone]  If 7PM-7AM, please contact night-coverage [email or web address] Password [email or web address] 12/27/2017, 12:08 PM

## 2017-12-28 LAB — CBC WITH DIFFERENTIAL/PLATELET
Basophils Absolute: 0 10*3/uL (ref 0.0–0.1)
Basophils Relative: 0 %
EOS ABS: 0.2 10*3/uL (ref 0.0–0.7)
Eosinophils Relative: 2 %
HEMATOCRIT: 40.3 % (ref 39.0–52.0)
HEMOGLOBIN: 13.2 g/dL (ref 13.0–17.0)
LYMPHS ABS: 3.9 10*3/uL (ref 0.7–4.0)
Lymphocytes Relative: 36 %
MCH: 27.3 pg (ref 26.0–34.0)
MCHC: 32.8 g/dL (ref 30.0–36.0)
MCV: 83.3 fL (ref 78.0–100.0)
MONOS PCT: 12 %
Monocytes Absolute: 1.3 10*3/uL — ABNORMAL HIGH (ref 0.1–1.0)
NEUTROS ABS: 5.4 10*3/uL (ref 1.7–7.7)
NEUTROS PCT: 50 %
Platelets: 351 10*3/uL (ref 150–400)
RBC: 4.84 MIL/uL (ref 4.22–5.81)
RDW: 13.5 % (ref 11.5–15.5)
WBC: 11 10*3/uL — AB (ref 4.0–10.5)

## 2017-12-28 LAB — BASIC METABOLIC PANEL
Anion gap: 8 (ref 5–15)
BUN: 14 mg/dL (ref 8–23)
CHLORIDE: 104 mmol/L (ref 98–111)
CO2: 26 mmol/L (ref 22–32)
CREATININE: 1.07 mg/dL (ref 0.61–1.24)
Calcium: 9 mg/dL (ref 8.9–10.3)
GFR calc Af Amer: >60 mL/min (ref >60)
GFR calc non Af Amer: >60 mL/min (ref >60)
GLUCOSE: 218 mg/dL — AB (ref 70–99)
POTASSIUM: 4.2 mmol/L (ref 3.5–5.1)
SODIUM: 138 mmol/L (ref 135–145)

## 2017-12-28 LAB — GLUCOSE, CAPILLARY
GLUCOSE-CAPILLARY: 157 mg/dL — AB (ref 70–99)
GLUCOSE-CAPILLARY: 207 mg/dL — AB (ref 70–99)
Glucose-Capillary: 144 mg/dL — ABNORMAL HIGH (ref 70–99)
Glucose-Capillary: 248 mg/dL — ABNORMAL HIGH (ref 70–99)

## 2017-12-28 NOTE — Plan of Care (Signed)
  Problem: Activity: Goal: Risk for activity intolerance will decrease Outcome: Progressing   Problem: Safety: Goal: Ability to remain free from injury will improve Outcome: Progressing   

## 2017-12-28 NOTE — Progress Notes (Signed)
PROGRESS NOTE    William Aguirre  ZOX:[id number] DOB: [birth year] DOA: 12/21/2017 PCP: Shirline Frees, NP   Brief Narrative: Patient is a 83 year old male with medical history significant ofHTN, HLD, DM type 2, CAD, UTIs, and dementia; who presented for approximately 10 days of altered mental status.[facility] was reported that patient fell at the nursing facility. Patient had a bruise on his left hip. Patient was found to have leukocytosis, mild fever on presentation. UA was suggestive of urinary tract infection. Urine culture showed Proteus.  He was being managed for  UTI and encephalopathy.  Mental status significantly improved during the hospitalization and is currently on baseline.  He is hemodynamically stable.  His oral intake has improved.  Palliative care also worked with the patient regarding goals of care, possible hospice initiation.  After discussion with the daughter and patient , patient was made DNR.  Plan was to follow-up with hospice as an outpatient on discharge. Patient is medically stable for discharge but  [facility] refusing [facility] him back.  We are working on placement.  Social Secretary/administrator.  Assessment & Plan:   Principal Problem:   UTI (urinary tract infection) Active Problems:   Diabetes mellitus (HCC)   Essential hypertension   CORONARY ATHEROSCLEROSIS NATIVE CORONARY ARTERY   Acute metabolic encephalopathy   Dehydration   Malnutrition (HCC)   Dementia without behavioral disturbance  Urinary tract infection with hematuria: Patient found to have urinalysis concerning for infection. Previous history of enterococcus faecalis that was sensitive to ampicillin, nitrofurantoin, and vancomycin.Patient was started  on Rocephin. Urine culture showed pansensitive Proteus. Blood cultureNGTD Patient also hadsignificant leukocytosis which has significantly improved. He is currently  hemodynamically stable. Antibiotics changed to Keflex .  Acutemetabolic encephalopathy:Much improved .He is awake, alert and communicative today.Oriented to place and person.Hepresented withaltered with decreased p.o. intake. CT imaging of the brain showed no acute abnormalities but global atrophy. Patient has history of underlying dementia.  Encephalopathy was secondary to UTI which has resolved.  Dehydration:Started on IV 75 mL/h.  Oral intake has much improved.IV fluid discontinued.  Dementia:Continue Aricept  Diabetes mellitus type 2:Last hemoglobin A1c 8.7 on 08/27/2017. Patient is followed in the outpatient setting by Dr. Tyron Russell. Resume home medications on discharge.  Malnutrition: Initial albumin 2.9 on admission. Patient with decreased p.o. intake over the last 2 days.Lowprealbumin .   Nutrition evaluated him during the hospitalization.  Essential hypertension: Continue bisoprolol and losartan. Currently normotensive.  Increase the dose of losartan 100 mg daily.  CAD: Continue aspirin, Plavix, and statin  Depression: Continue Zoloft  Hyperlipidemia Continue atorvastatin  Fall: Reported to have fallen at [place]. Complained of pain on the left hip. He has bruise on the left hip. Imaging did not show any fracture dislocation. Continue supportive care, pain management. Patient is a ALF resident. PT evaluated and recommended skilled nursing facility on discharge. Social worker consulted and assisting.  Advanced age/dementia/debility:We had requested for palliative care evaluation due to his multiple comorbidities, advanced age, dementia and decreased oral intake .  After discussion with the daughter and the patient himself ,code status  changed to DNR.  Palliative care recommended to follow-up with hospice after discharge.     DVT prophylaxis: Lovenox Code Status: DNR Family Communication: None present at the bedside Disposition Plan:    Assisted living facility versus skilled nursing facility.  Social worker asisting.  He is stable for discharge as soon as the bed is available.  Consultants: None  Procedures: None  Antimicrobials: Keflex  Subjective: Patient seen and examined the bedside this morning.  No new issues.  No complaints today.  Remains comfortable and hemodynamically stable.  He is waiting for placement.  Objective: Vitals:   12/27/17 0416 12/27/17 1400 12/27/17 2111 12/28/17 0622  BP: (!) 144/74 132/70 107/63 135/75  Pulse: 67 68 67 65  Resp: 16 18  17   Temp: 98.1 F (36.7 C) 98.7 F (37.1 C) 98 F (36.7 C) 98.1 F (36.7 C)  TempSrc: Oral Oral Oral Oral  SpO2: 98% 99% 96% 96%  Weight:      Height:        Intake/Output Summary (Last 24 hours) at 12/28/2017 0858 Last data filed at 12/28/2017 0615 Gross per 24 hour  Intake 363 ml  Output 400 ml  Net -37 ml   Filed Weights   12/22/17 0101  Weight: 83.6 kg    Examination:  General exam: Appears calm and comfortable ,Not in distress,average built HEENT:PERRL,Oral mucosa moist, Ear/Nose normal on gross exam Respiratory system: Bilateral equal air entry, normal vesicular breath sounds, no wheezes or crackles  Cardiovascular system: S1 & S2 heard, RRR. No JVD, murmurs, rubs, gallops or clicks. Gastrointestinal system: Abdomen is nondistended, soft and nontender. No organomegaly or masses felt. Normal bowel sounds heard. Central nervous system: Alert and oriented to time and place.  Extremities: No edema, no clubbing ,no cyanosis, distal peripheral pulses palpable. Skin: No rashes, lesions or ulcers,no icterus ,no pallor   Data Reviewed: I have personally reviewed following labs and imaging studies  CBC: Recent Labs  Lab 12/21/17 1757 12/22/17 0402 12/23/17 0405 12/24/17 0412 12/28/17 0352  WBC 31.7* 26.1* 16.2* 11.9* 11.0*  NEUTROABS 26.6*  --  11.7* 8.4* 5.4  HGB 13.6 13.6 12.3* 13.1 13.2  HCT 41.1 40.9 38.2* 40.5 40.3  MCV  84.0 83.6 85.1 85.1 83.3  PLT 247 228 250 278 351   Basic Metabolic Panel: Recent Labs  Lab 12/21/17 1727 12/21/17 1757 12/22/17 0402 12/28/17 0352  NA 141 142 145 138  K 4.1 4.1 4.1 4.2  CL 108 105 110 104  CO2  --  25 25 26   GLUCOSE 136* 134* 101* 218*  BUN 30* 33* 30* 14  CREATININE 1.10 1.14 1.00 1.07  CALCIUM  --  9.2 9.0 9.0   GFR: Estimated Creatinine Clearance: 57.7 mL/min (by C-G formula based on SCr of 1.07 mg/dL). Liver Function Tests: Recent Labs  Lab 12/21/17 1757 12/22/17 0402  AST 48* 47*  ALT 39 38  ALKPHOS 128* 106  BILITOT 0.8 0.9  PROT 7.9 7.0  ALBUMIN 2.9* 2.6*   No results for input(s): LIPASE, AMYLASE in the last 168 hours. Recent Labs  Lab 12/21/17 1757  AMMONIA 9   Coagulation Profile: No results for input(s): INR, PROTIME in the last 168 hours. Cardiac Enzymes: Recent Labs  Lab 12/21/17 1757  TROPONINI <0.03   BNP (last 3 results) No results for input(s): PROBNP in the last 8760 hours. HbA1C: No results for input(s): HGBA1C in the last 72 hours. CBG: Recent Labs  Lab 12/27/17 0903 12/27/17 1234 12/27/17 1636 12/27/17 2131 12/28/17 0734  GLUCAP 146* 148* 177* 283* 157*   Lipid Profile: No results for input(s): CHOL, HDL, LDLCALC, TRIG, CHOLHDL, LDLDIRECT in the last 72 hours. Thyroid Function Tests: No results for input(s): TSH, T4TOTAL, FREET4, T3FREE, THYROIDAB in the last 72 hours. Anemia Panel: No results for input(s): VITAMINB12, FOLATE, FERRITIN, TIBC, IRON, RETICCTPCT in the last  72 hours. Sepsis Labs: Recent Labs  Lab 12/21/17 1728  LATICACIDVEN 1.48    Recent Results (from the past 240 hour(s))  Blood Cultures (routine x 2)     Status: None   Collection Time: 12/21/17  5:28 PM  Result Value Ref Range Status   Specimen Description   Final    BLOOD LEFT ARM UPPER Performed at [facility], [address] [place], [place] [address]    Special Requests   Final    BOTTLES DRAWN AEROBIC  AND ANAEROBIC Blood Culture adequate volume Performed at [facility], [address] [place], [place] [address]    Culture   Final    NO GROWTH 5 DAYS Performed at [facility], [address] [place], [place] [address]    Report Status 12/26/2017 FINAL  Final  Urine culture     Status: Abnormal   Collection Time: 12/21/17  5:57 PM  Result Value Ref Range Status   Specimen Description   Final    URINE, CLEAN CATCH Performed at [facility], [address] [place], [place] [address]    Special Requests   Final    NONE Performed at [facility], [address] [place], [place] [address]    Culture >=100,000 COLONIES/mL PROTEUS MIRABILIS (A)  Final   Report Status 12/24/2017 FINAL  Final   Organism ID, Bacteria PROTEUS MIRABILIS (A)  Final      Susceptibility   Proteus mirabilis - MIC*    AMPICILLIN <=2 SENSITIVE Sensitive     CEFAZOLIN <=4 SENSITIVE Sensitive     CEFTRIAXONE <=1 SENSITIVE Sensitive     CIPROFLOXACIN >=4 RESISTANT Resistant     GENTAMICIN <=1 SENSITIVE Sensitive     IMIPENEM 4 SENSITIVE Sensitive     NITROFURANTOIN 128 RESISTANT Resistant     TRIMETH/SULFA 40 SENSITIVE Sensitive     AMPICILLIN/SULBACTAM <=2 SENSITIVE Sensitive     PIP/TAZO <=4 SENSITIVE Sensitive     * >=100,000 COLONIES/mL PROTEUS MIRABILIS  Blood Cultures (routine x 2)     Status: None   Collection Time: 12/21/17  7:46 PM  Result Value Ref Range Status   Specimen Description   Final    BLOOD RIGHT HAND Performed at [facility], [address] [place], [place] [address]    Special Requests   Final    BOTTLES DRAWN AEROBIC ONLY Blood Culture results may not be optimal due to an inadequate volume of blood received in culture bottles Performed at [facility], [address] [place], [place] [address]    Culture   Final    NO GROWTH 5 DAYS Performed at [facility], [address] [place], [place] [address]    Report Status 12/26/2017 FINAL  Final  MRSA PCR Screening     Status: None   Collection Time: 12/21/17 10:49 PM  Result Value Ref Range Status   MRSA by PCR NEGATIVE NEGATIVE Final    Comment:        The GeneXpert MRSA Assay (FDA approved for NASAL specimens only), is one component of a comprehensive MRSA colonization surveillance program. It is not intended to diagnose MRSA infection nor to guide or monitor treatment for MRSA infections. Performed at [facility], [address] [place], [place] [address]          Radiology Studies: No results found.      Scheduled Meds: . aspirin EC  81 mg Oral Daily  . atorvastatin  80 mg Oral q1800  . bisoprolol  5 mg Oral BID  . cephALEXin  500 mg Oral Q12H  . clopidogrel  75 mg Oral Q supper  . donepezil  10 mg Oral QHS  . enoxaparin (LOVENOX) injection  40 mg Subcutaneous Q24H  . feeding supplement (ENSURE ENLIVE)  237 mL Oral BID BM  . insulin aspart  0-9 Units Subcutaneous TID WC  . insulin detemir  10 Units Subcutaneous Daily  . losartan  100 mg Oral Daily  . losartan  50 mg Oral Once  . pantoprazole  40 mg Oral Daily  . sertraline  50 mg Oral QHS  . sodium chloride flush  3 mL Intravenous Q12H   Continuous Infusions:   LOS: 6 days    Time spent: 25 mins.     Burnadette Pop, MD [facility] Pager [phone]  If 7PM-7AM, please contact night-coverage [email or web address] Password [facility] 12/28/2017, 8:58 AM

## 2017-12-28 NOTE — Progress Notes (Signed)
CSW following to assist with discharge planning to SNF.  CSW followed up with local SNFs to inquire about possible bed availability for patient.   [facility] - no Anayiah Howden [facility] beds available [facility] available [facility] to [facility] with one month of private pay and medicaid pending [facility] - no Lynesha Bango term care beds available [place] - no Yariel Ferraris term care beds available [facility] - no Rosalind Guido term [facility] beds available [facility] to [facility]  [facility] to [facility] - [facility] Calahan Pak term care beds available [place] [facility] pending as Tonianne Fine as patient is willing to sign over SSI to SNF  CSW contacted patient's daughter to provide update, no answer. CSW left voicemail requesting return phone call.  CSW will continue to follow and assist with discharge planning.  Celso SickleKimberly Anson Peddie, [place] Clinical Social Worker [facility] Cell#: [phone]

## 2017-12-29 DIAGNOSIS — F0390 Unspecified dementia without behavioral disturbance: Secondary | ICD-10-CM

## 2017-12-29 DIAGNOSIS — Z23 Encounter for immunization: Secondary | ICD-10-CM | POA: Diagnosis not present

## 2017-12-29 DIAGNOSIS — N3000 Acute cystitis without hematuria: Secondary | ICD-10-CM

## 2017-12-29 DIAGNOSIS — I10 Essential (primary) hypertension: Secondary | ICD-10-CM

## 2017-12-29 DIAGNOSIS — G9341 Metabolic encephalopathy: Secondary | ICD-10-CM

## 2017-12-29 LAB — GLUCOSE, CAPILLARY
GLUCOSE-CAPILLARY: 152 mg/dL — AB (ref 70–99)
Glucose-Capillary: 288 mg/dL — ABNORMAL HIGH (ref 70–99)

## 2017-12-29 MED ORDER — GLUCERNA SHAKE PO LIQD
237.0000 mL | Freq: Three times a day (TID) | ORAL | Status: DC
  Administered 2017-12-29: 237 mL via ORAL
  Filled 2017-12-29 (×2): qty 237

## 2017-12-29 MED ORDER — TRAMADOL HCL 50 MG PO TABS: 50.0000 mg | ORAL_TABLET | Freq: Two times a day (BID) | ORAL | 0 refills | Status: AC | PRN

## 2017-12-29 MED ORDER — INSULIN DETEMIR 100 UNIT/ML FLEXPEN: 10.0000 [IU] | PEN_INJECTOR | SUBCUTANEOUS | 11 refills | Status: AC

## 2017-12-29 MED ORDER — INSULIN ASPART 100 UNIT/ML ~~LOC~~ SOLN
0.0000 [IU] | Freq: Three times a day (TID) | SUBCUTANEOUS | Status: DC
  Administered 2017-12-29: 8 [IU] via SUBCUTANEOUS

## 2017-12-29 NOTE — Discharge Instructions (Signed)
Fall Prevention in Hospitals, Adult WHAT ARE SOME SAFETY TIPS FOR PREVENTING FALLS? If you or a loved one has to stay in the hospital, talk with the health care providers about the risk of falling. Find out which medicines or treatments can cause dizziness or affect balance. Make a plan with the health care providers to prevent falls. The plan may include these points:  Ask for help moving around, especially after surgery or when feeling unwell.  Have support available when getting up and moving around.  Wear nonskid footwear.  Use the safety straps on wheelchairs.  Ask for help to get objects that are out of reach.  Wear eyeglasses.  Remove all clutter from the floor and the sides of the bed.  Keep equipment and wires securely out of the way.  Keep the bed locked in the low position.  Keep the side rails up on the bed.  Keep the nurse call button within reach.  Keep the door open when no one else is in the room.  Have someone stay in the hospital with you or your loved one.  Ask for a bed alarm if you are not able to stay with your loved one who is at risk for getting up without help.  Ask if sleeping pills or other medicines that alter mental status are necessary.  WHAT INCREASES THE RISK FOR FALLS? Certain conditions and treatments may increase a patient's risk for falls in a hospital. These include:  Being in an unfamiliar environment.  Being on bed rest.  Having surgery.  Taking certain medicines, such as sleeping pills.  Having tubes in place, such as IV lines or catheters.  Additional risk factors for falls in a hospital include:  Having vision problems.  Having a change in thinking, feeling, or behavior (altered mental status).  Having trouble with balance.  Needing to use the toilet frequently.  Having fallen in the past three months.  Having low blood pressure when standing up quickly (orthostatic hypotension).  WHAT DOES THE HOSPITAL STAFF DO TO  HELP PREVENT ME OR MY LOVED ONE FROM FALLING? Hospitals have systems in place to prevent falls and accidents. Talk with the hospital staff about:  Doing an assessment to discuss fall risks and create a personalized plan to prevent falls.  Checking in regularly to see if help is needed for moving around and to assess any changes in fall risk.  Knowing where the nurse call button is and how to use it. Use this to call a nursing care provider any time.  Keeping personal items within reach. This includes eyeglasses, phones, and other electronic devices.  Following general safety guidelines when moving around.  Keeping the area around the bed free from clutter.  Removing unnecessary equipment or tubes to reduce the risk of tripping.  Using safety equipment, such as: ? Walkers, crutches, and other walking devices for support. ? Safety rails on the bed. ? Safety straps in the bed. ? A bed that can be lowered and locked to prevent movement. ? Handrails in the bathroom. ? Nonskid socks and shoes. ? Locking mechanisms to secure equipment in place. ? Lifting and transfer equipment.  WHAT CAN I DO TO HELP PREVENT A FALL?  Talk with health care providers about fall prevention.  Have a personalized fall prevention plan in place.  Do not try to move around if you feel off balance or ill.  Change position slowly.  Sit on the side of the bed before standing up.  Sit down and call for help if you feel dizzy or unsteady when standing.  Keep the hospital room clear of clutter.  WHEN SHOULD I ASK FOR HELP? Ask for help whenever you:  Are not sure if you are able to move around safely.  Feel dizzy or unsteady.  Are not comfortable helping your loved one move around or use the bathroom.  If you or a loved one falls, tell the hospital staff. This is important. This information is not intended to replace advice given to you by your health care provider. Make sure you discuss any questions  you have with your health care provider. Document Released: 03/27/2000 Document Revised: 08/27/2015 Document Reviewed: 01/10/2015 Elsevier Interactive Patient Education  2017 [employer]   Intravenous Pyelogram, Care After Refer to this sheet in the next few weeks. These instructions provide you with information about caring for yourself after your procedure. Your health care provider may also give you more specific instructions. Your treatment has been planned according to current medical practices, but problems sometimes occur. Call your health care provider if you have any problems or questions after your procedure. What can I expect after the procedure? After your procedure, it is common to feel weak from not eating or drinking. Follow these instructions at home:  Return to your normal activities as told by your health care provider. Ask your health care provider what activities are safe for you.  Drink enough fluid to keep your urine clear or pale yellow.  It is your responsibility to get the results of your procedure. Ask your health care provider or the department performing the procedure when your results will be ready. Contact a health care provider if:  You start urinating less than you usually do. Get help right away if:  You feel nauseous or you vomit.  You have itching.  You have trouble breathing.  Your throat swells.  You have chest pain.  You have chills or a fever. This information is not intended to replace advice given to you by your health care provider. Make sure you discuss any questions you have with your health care provider. Document Released: 12/19/2014 Document Revised: 09/05/2015 Document Reviewed: 06/14/2014 Elsevier Interactive Patient Education  2018 [employer].   Intravenous Pyelogram An intravenous pyelogram is an X-ray of the urinary tract. The urinary tract is the system through which urine travels. This tract includes the kidneys,  ureters, and bladder. An intravenous pyelogram can help your health care provider to find problems, such as:  Kidney stones.  Bladder stones.  An enlarged prostate.  Tumors.  Tell a health care provider about:  Any allergies you have.  All medicines you are taking, including vitamins, herbs, eye drops, creams, and over-the-counter medicines.  Any problems you or family members have had with anesthetic medicines.  Any blood disorders you have.  Any surgeries you have had.  Any medical conditions you have.  Whether you are pregnant or may be pregnant. What are the risks? Generally, this is a safe procedure. However, problems may occur, including:  Nausea.  An allergic reaction to the dye that is used during the procedure.  What happens before the procedure?  Follow instructions from your health care provider about eating or drinking restrictions.  Ask your health care provider about changing or stopping your regular medicines. This is especially important if you are taking diabetes medicines or blood thinners. What happens during the procedure?  You will put on a hospital gown.  You will  lie down on an exam table.  An IV tube will be inserted into one of your veins.  A contrast dye will be injected through the IV tube. This dye will help your health care provider to see the urinary tract better on the X-rays. When the dye enters your body, you may feel warm or have a strange taste in your mouth. The feeling will not last long.  To make the X-rays clearer, pressure may be applied to your abdomen, or you may be asked to change positions.  You may be asked to empty your bladder before the final X-ray is taken. The procedure may vary among health care providers and hospitals. What happens after the procedure?  You may safely drive home right after the procedure.  You may return to your normal activities right after the procedure. This information is not intended to  replace advice given to you by your health care provider. Make sure you discuss any questions you have with your health care provider. Document Released: 03/27/2000 Document Revised: 09/05/2015 Document Reviewed: 07/23/2014 Elsevier Interactive Patient Education  [employer].

## 2017-12-29 NOTE — Plan of Care (Signed)
  Problem: Health Behavior/Discharge Planning: Goal: Ability to manage health-related needs will improve Outcome: Progressing   Problem: Activity: Goal: Risk for activity intolerance will decrease Outcome: Progressing   Problem: Elimination: Goal: Will not experience complications related to bowel motility Outcome: Progressing   Problem: Safety: Goal: Ability to remain free from injury will improve Outcome: Progressing   

## 2017-12-29 NOTE — Discharge Summary (Signed)
Physician Discharge Summary  [name] MVH:[id number] DOB: 21-Nov-1934 DOA: 12/21/2017  PCP: Shirline Frees, NP  Admit date: 12/21/2017 Discharge date: 12/29/2017  Admitted From: ALF Disposition: SNF  Recommendations for Outpatient Follow-up:  1. Follow up with PCP in 1 week 2. Please obtain BMP/CBC in one week 3. Palliative care consult 4. Recommend hospice consult when discharged from SNF 5. Please follow up on the following pending results: None  Home Health: SNF Equipment/Devices: None  Discharge Condition: Stable CODE STATUS: DNR Diet recommendation: Heart healthy/Carb modified   Brief/Interim Summary:  Admission HPI written by Clydie Braun, MD   HPI: William Aguirre is a 83 y.o. male with medical history significant of HTN, HLD, DM type 2, CAD, UTIs, and dementia; who presented for approximately 10 days of altered mental status.  History mostly obtained from review of records and grandaughter is present at bedside as patient is currently not at his baseline which reportedly is alert and oriented at least to person and place.  Previously his Mini-Mental status exam score was a 25, but over the last year had dropped to 18 when last checked by his neurologist.  Facility staff noted increasing lethargy and confusion which was unusual for the patient.  His last reported fall occurred approximately 9 days ago, and patient complained of right knee pain.  He is mostly wheelchair bound.  There were no report of patient sustaining any trauma to his head or loss of consciousness.  He was seen in the emergency department 7 days ago.  X-ray of the knee imaging showed an effusion without acute fracture. Patient was been seen in the emergency department the following day for being altered, but after initial work-up and fluids was feeling better and requesting discharged back to nursing facility.  Urine at that time did not show clear signs of infection.  At this time patient denies any  complaints.  Granddaughter notes that he chronically has a dry cough and frequently smells of strong urine which he has had multiple urinary tract infections in the past.  Over the last 10 days he had intermittently been refusing to take his medications, eat, or be changed.  En route with EMS vital signs were noted to be stable.  Initial CBG 125.  ED Course: Upon admission to the emergency department patient was noted to have a rectal temperature of 99.9 F.  Significant labs included WBC 31.7(increased from 14.7 on 9/4), BUN 33, creatinine 1.14, lactic acid 1.48, alkaline phosphatase 128, albumin 2.9, and AST 148.  Ammonia and alcohol level within normal limits.  Urinalysis was positive for moderate leukocytes, hemoglobin, few bacteria, white clumps present, and 21-50 WBCs.  Patient was treated empirically with Rocephin and given 1 L normal saline IV fluids.  Blood cultures were obtained and [facility] called to admit for UTI and encephalopathy.    Hospital course:  Urinary tract infection Urine culture significant for pansensitive Proteus. Patient was empirically treated with ceftriaxone which was transitioned to Keflex. He completed a 7 day course.  Acute metabolic encephalopathy Secondary to urinary tract infection. Back to baseline.  Dehydration Given IV fluids. Oral intake improved.  Dementia Continued Aricept  Diabetes mellitus, type 2 Hemoglobin A1C of 8.7%. Sliding scale insulin while inpatient.   Malnutrition Nutrition consulted. Protein supplementation given.  CAD Continued aspirin, Plavix and Lipitor  Depression Continued Zoloft  Essential hypertension Continued bisoprolol and losartan. Losartan increased to 100 mg daily.  Fall Left hip pain. No fracture on imaging.  Discharge Diagnoses:  Principal Problem:   UTI (urinary tract infection) Active Problems:   Diabetes mellitus (HCC)   Essential hypertension   CORONARY ATHEROSCLEROSIS NATIVE CORONARY ARTERY    Acute metabolic encephalopathy   Dehydration   Malnutrition (HCC)   Dementia without behavioral disturbance    Discharge Instructions  Discharge Instructions    Diet - low sodium heart healthy   Complete by:  As directed    Discharge instructions   Complete by:  As directed    1) Please take prescribed medications as instructed. 2)Do CBC and Bmp tests in a week.   Increase activity slowly   Complete by:  As directed      Allergies as of 12/29/2017      Reactions   Hydrocodone-acetaminophen Other (See Comments)    Delusions      Medication List    TAKE these medications   acetaminophen 325 MG tablet Commonly known as:  TYLENOL Take 2 tablets (650 mg total) by mouth every 6 (six) hours as needed for mild pain (or Fever >/= 101).   aspirin 81 MG EC tablet Take 1 tablet (81 mg total) by mouth daily. Swallow whole.   atorvastatin 80 MG tablet Commonly known as:  LIPITOR TAKE 1 TABLET BY MOUTH ONCE DAILY What changed:    how much to take  how to take this  when to take this   bisoprolol 5 MG tablet Commonly known as:  ZEBETA Take 1 tablet (5 mg total) by mouth 2 (two) times daily. What changed:  when to take this   clopidogrel 75 MG tablet Commonly known as:  PLAVIX TAKE 1 TABLET BY MOUTH ONCE DAILY WITH BREAKFAST What changed:  See the new instructions.   cyclobenzaprine 5 MG tablet Commonly known as:  FLEXERIL Take 1 tablet (5 mg total) by mouth 2 (two) times daily as needed for muscle spasms.   donepezil 10 MG tablet Commonly known as:  ARICEPT Take 1 tablet (10 mg total) by mouth at bedtime.   feeding supplement (ENSURE ENLIVE) Liqd Take 237 mLs by mouth 2 (two) times daily between meals.   insulin aspart 100 UNIT/ML injection Commonly known as:  novoLOG Inject 3 Units into the skin 3 (three) times daily with meals. Only if cbg over 300   Insulin Detemir 100 UNIT/ML Pen Commonly known as:  LEVEMIR Inject 10 Units into the skin every  morning. What changed:  how much to take   losartan 100 MG tablet Commonly known as:  COZAAR Take 1 tablet (100 mg total) by mouth daily. What changed:    medication strength  how much to take   ondansetron 4 MG tablet Commonly known as:  ZOFRAN Take 1 tablet (4 mg total) by mouth every 8 (eight) hours as needed for nausea or vomiting.   pantoprazole 40 MG tablet Commonly known as:  PROTONIX Take 1 tablet (40 mg total) by mouth daily.   sertraline 50 MG tablet Commonly known as:  ZOLOFT Take 50 mg by mouth at bedtime.   traMADol 50 MG tablet Commonly known as:  ULTRAM Take 1 tablet (50 mg total) by mouth 2 (two) times daily as needed for severe pain.   TUSSIN DM 10-100 MG/5ML liquid Generic drug:  Dextromethorphan-guaiFENesin Take 5 mLs by mouth every 12 (twelve) hours.       Contact information for follow-up providers    Shirline Frees, NP. Schedule an appointment as soon as possible for a visit in 1 week(s).   Specialty:  Family Medicine Contact information: [address] [place] [address] [phone]            Contact information for after-discharge care    Destination    [facility] .   Service:  Skilled Nursing Contact information: [address] [place] [address] [phone]                 Allergies  Allergen Reactions  . Hydrocodone-Acetaminophen Other (See Comments)     Delusions    Consultations:  None   Procedures/Studies: Dg Chest 2 View  Result Date: 12/21/2017 CLINICAL DATA:  Altered mental status with confusion EXAM: CHEST - 2 VIEW COMPARISON:  December 15, 2017 FINDINGS: There is no edema or consolidation. Heart is upper normal in size with pulmonary vascularity normal. No adenopathy. There is aortic atherosclerosis. There is degenerative change in the thoracic spine and in each shoulder. IMPRESSION: No edema or consolidation. Heart size normal. There is aortic atherosclerosis.  Aortic Atherosclerosis (ICD10-I70.0). Electronically Signed   By: Bretta Bang III M.D.   On: 12/21/2017 19:18   Dg Chest 2 View  Result Date: 12/15/2017 CLINICAL DATA:  Altered mental status EXAM: CHEST - 2 VIEW COMPARISON:  01/06/2017 FINDINGS: Low lung volumes with slight crowding of interstitial lung markings. Chronic elevation of the right hemidiaphragm. Top normal size cardiac silhouette with mild aortic atherosclerosis. No aneurysm is identified. Degenerative changes are present about the [facility] and glenohumeral joints. No alveolar consolidation, effusion or pneumothorax. No overt pulmonary edema. Subsegmental atelectasis is seen in the left mid lung. IMPRESSION: Low lung volumes without active pulmonary disease. Aortic atherosclerosis (ICD10-I70.0) Electronically Signed   By: Tollie Eth M.D.   On: 12/15/2017 17:34   Ct Head Wo Contrast  Result Date: 12/21/2017 CLINICAL DATA:  83 year old male with altered mental status and lethargy for the past 10 days. Subsequent encounter. EXAM: CT HEAD WITHOUT CONTRAST TECHNIQUE: Contiguous axial images were obtained from the base of the skull through the vertex without intravenous contrast. COMPARISON:  12/15/2017 and 01/06/2017 head CT. 01/06/2017 brain MR. FINDINGS: Brain: No intracranial hemorrhage or CT evidence of large acute infarct. Chronic microvascular changes. Moderate global atrophy. No intracranial mass lesion noted on this unenhanced exam. Vascular: Vascular calcifications.  No hyperdense vessel. Skull: Negative Sinuses/Orbits: No acute orbital abnormality. Minimal mucosal thickening inferior left maxillary sinus. Other: Mastoid air cells and middle ear cavities are clear. IMPRESSION: 1. No acute intracranial abnormality. 2. Chronic microvascular changes. 3. Global atrophy. Electronically Signed   By: Lacy Duverney M.D.   On: 12/21/2017 18:25   Ct Head Wo Contrast  Result Date: 12/15/2017 CLINICAL DATA:  Altered mental status EXAM: CT HEAD  WITHOUT CONTRAST TECHNIQUE: Contiguous axial images were obtained from the base of the skull through the vertex without intravenous contrast. COMPARISON:  Head CT 01/06/2017 FINDINGS: Brain: There is no mass, hemorrhage or extra-axial collection. There is generalized atrophy without lobar predilection. There is hypoattenuation of the periventricular white matter, most commonly indicating chronic ischemic microangiopathy. Vascular: Atherosclerotic calcification of the internal carotid arteries at the skull base. No abnormal hyperdensity of the major intracranial arteries or dural venous sinuses. Skull: The visualized skull base, calvarium and extracranial soft tissues are normal. Sinuses/Orbits: No fluid levels or advanced mucosal thickening of the visualized paranasal sinuses. No mastoid or middle ear effusion. The orbits are normal. IMPRESSION: 1. No acute intracranial abnormality. 2. Atrophy and chronic ischemic microangiopathy. Electronically Signed   By: Deatra Robinson M.D.   On: 12/15/2017  14:26   Dg Knee Complete 4 Views Right  Result Date: 12/14/2017 CLINICAL DATA:  Fall 2 days ago.  Pain.  Swelling. EXAM: RIGHT KNEE - COMPLETE 4+ VIEW COMPARISON:  MRI 07/08/2016. FINDINGS: Prominent suprapatellar soft tissue swelling. Prominent knee joint effusion. Tiny corticated bony densities noted adjacent to the patella most likely related old injury and/or degenerative change. No definite acute abnormality identified. Tricompartment degenerative change with chondrocalcinosis. Peripheral vascular calcification IMPRESSION: 1. Prominent suprapatellar soft tissue swelling. Prominent knee joint effusion. Tiny corticated bony densities noted adjacent to the patella most likely related to old injury and/or degenerative change. No definite acute abnormality identified. 2.  Tricompartment degenerative change with chondrocalcinosis. 3.  Peripheral vascular disease. Electronically Signed   By: Maisie Fus  Register   On: 12/14/2017  12:19   Dg Hips Bilat With Pelvis 3-4 Views  Result Date: 12/22/2017 CLINICAL DATA:  Bilateral hip pain.  Unclear injury. EXAM: DG HIP (WITH OR WITHOUT PELVIS) 3-4V BILAT COMPARISON:  Pelvis x-ray dated July 08, 2016. FINDINGS: There is no evidence of hip fracture or dislocation. There is no evidence of arthropathy or other focal bone abnormality. Degenerative changes of the lower lumbar spine. Osteopenia. Soft tissues are unremarkable. Vascular calcifications. IMPRESSION: No acute osseous abnormality. Electronically Signed   By: Obie Dredge M.D.   On: 12/22/2017 11:29      Subjective: No concerns.  Discharge Exam: Vitals:   12/29/17 0507 12/29/17 1348  BP: 137/84 122/75  Pulse: 61 70  Resp: 17 20  Temp: 97.6 F (36.4 C)   SpO2: 100% 95%   Vitals:   12/28/17 1700 12/28/17 2237 12/29/17 0507 12/29/17 1348  BP: (!) 142/70 136/71 137/84 122/75  Pulse: 82 67 61 70  Resp: 16 17 17 20   Temp: 97.9 F (36.6 C) 98 F (36.7 C) 97.6 F (36.4 C)   TempSrc: Oral Oral Oral   SpO2: 97% 97% 100% 95%  Weight:      Height:        General: Pt is alert, awake, not in acute distress Cardiovascular: RRR, S1/S2 +, no rubs, no gallops Respiratory: CTA bilaterally, no wheezing, no rhonchi Abdominal: Soft, NT, ND, bowel sounds + Extremities: no edema, no cyanosis Neuro: alert and oriented to person    The results of significant diagnostics from this hospitalization (including imaging, microbiology, ancillary and laboratory) are listed below for reference.     Microbiology: Recent Results (from the past 240 hour(s))  Blood Cultures (routine x 2)     Status: None   Collection Time: 12/21/17  5:28 PM  Result Value Ref Range Status   Specimen Description   Final    BLOOD LEFT ARM UPPER Performed at [facility], [address] [place], [place] [address]    Special Requests   Final    BOTTLES DRAWN AEROBIC AND ANAEROBIC Blood Culture adequate volume Performed at  [facility], [address], [place], [place] [address]    Culture   Final    NO GROWTH 5 DAYS Performed at [facility], [address], [place], [place] [address]    Report Status 12/26/2017 FINAL  Final  Urine culture     Status: Abnormal   Collection Time: 12/21/17  5:57 PM  Result Value Ref Range Status   Specimen Description   Final    URINE, CLEAN CATCH Performed at [facility], [address] [place], [place] [address]    Special Requests   Final  NONE Performed at [facility], [address], [place], [place] [address]    Culture >=100,000 COLONIES/mL PROTEUS MIRABILIS (A)  Final   Report Status 12/24/2017 FINAL  Final   Organism ID, Bacteria PROTEUS MIRABILIS (A)  Final      Susceptibility   Proteus mirabilis - MIC*    AMPICILLIN <=2 SENSITIVE Sensitive     CEFAZOLIN <=4 SENSITIVE Sensitive     CEFTRIAXONE <=1 SENSITIVE Sensitive     CIPROFLOXACIN >=4 RESISTANT Resistant     GENTAMICIN <=1 SENSITIVE Sensitive     IMIPENEM 4 SENSITIVE Sensitive     NITROFURANTOIN 128 RESISTANT Resistant     TRIMETH/SULFA 40 SENSITIVE Sensitive     AMPICILLIN/SULBACTAM <=2 SENSITIVE Sensitive     PIP/TAZO <=4 SENSITIVE Sensitive     * >=100,000 COLONIES/mL PROTEUS MIRABILIS  Blood Cultures (routine x 2)     Status: None   Collection Time: 12/21/17  7:46 PM  Result Value Ref Range Status   Specimen Description   Final    BLOOD RIGHT HAND Performed at [facility], [address] [place], [place] [address]    Special Requests   Final    BOTTLES DRAWN AEROBIC ONLY Blood Culture results may not be optimal due to an inadequate volume of blood received in culture bottles Performed at [facility], [address] [place], [place] [address]    Culture   Final    NO GROWTH 5 DAYS Performed at [facility], [address], [place], [place] [address]    Report Status  12/26/2017 FINAL  Final  MRSA PCR Screening     Status: None   Collection Time: 12/21/17 10:49 PM  Result Value Ref Range Status   MRSA by PCR NEGATIVE NEGATIVE Final    Comment:        The GeneXpert MRSA Assay (FDA approved for NASAL specimens only), is one component of a comprehensive MRSA colonization surveillance program. It is not intended to diagnose MRSA infection nor to guide or monitor treatment for MRSA infections. Performed at [facility], [address], [place], [place] [address]      Labs: BNP (last 3 results) No results for input(s): BNP in the last 8760 hours. Basic Metabolic Panel: Recent Labs  Lab 12/28/17 0352  NA 138  K 4.2  CL 104  CO2 26  GLUCOSE 218*  BUN 14  CREATININE 1.07  CALCIUM 9.0   Liver Function Tests: No results for input(s): AST, ALT, ALKPHOS, BILITOT, PROT, ALBUMIN in the last 168 hours. No results for input(s): LIPASE, AMYLASE in the last 168 hours. No results for input(s): AMMONIA in the last 168 hours. CBC: Recent Labs  Lab 12/23/17 0405 12/24/17 0412 12/28/17 0352  WBC 16.2* 11.9* 11.0*  NEUTROABS 11.7* 8.4* 5.4  HGB 12.3* 13.1 13.2  HCT 38.2* 40.5 40.3  MCV 85.1 85.1 83.3  PLT 250 278 351   Cardiac Enzymes: No results for input(s): CKTOTAL, CKMB, CKMBINDEX, TROPONINI in the last 168 hours. BNP: Invalid input(s): POCBNP CBG: Recent Labs  Lab 12/28/17 1218 12/28/17 1648 12/28/17 2237 12/29/17 0724 12/29/17 1133  GLUCAP 144* 248* 207* 152* 288*   D-Dimer No results for input(s): DDIMER in the last 72 hours. Hgb A1c No results for input(s): HGBA1C in the last 72 hours. Lipid Profile No results for input(s): CHOL, HDL, LDLCALC, TRIG, CHOLHDL, LDLDIRECT in the last 72 hours. Thyroid function studies No results for input(s): TSH, T4TOTAL, T3FREE,  THYROIDAB in the last 72 hours.  Invalid input(s): FREET3 Anemia work up No results for input(s): VITAMINB12, FOLATE, FERRITIN, TIBC, IRON,  RETICCTPCT in the last 72 hours. Urinalysis    Component Value Date/Time   COLORURINE AMBER (A) 12/21/2017 1757   APPEARANCEUR HAZY (A) 12/21/2017 1757   LABSPEC 1.018 12/21/2017 1757   PHURINE 5.0 12/21/2017 1757   GLUCOSEU NEGATIVE 12/21/2017 1757   HGBUR LARGE (A) 12/21/2017 1757   BILIRUBINUR NEGATIVE 12/21/2017 1757   BILIRUBINUR n 03/26/2015 1025   KETONESUR NEGATIVE 12/21/2017 1757   PROTEINUR 30 (A) 12/21/2017 1757   UROBILINOGEN 0.2 03/26/2015 1025   UROBILINOGEN 0.2 05/29/2013 0216   NITRITE NEGATIVE 12/21/2017 1757   LEUKOCYTESUR MODERATE (A) 12/21/2017 1757   Sepsis Labs Invalid input(s): PROCALCITONIN,  WBC,  LACTICIDVEN Microbiology Recent Results (from the past 240 hour(s))  Blood Cultures (routine x 2)     Status: None   Collection Time: 12/21/17  5:28 PM  Result Value Ref Range Status   Specimen Description   Final    BLOOD LEFT ARM UPPER Performed at [facility], [address], [place], [place] [address]    Special Requests   Final    BOTTLES DRAWN AEROBIC AND ANAEROBIC Blood Culture adequate volume Performed at [facility], [address], [place], [place] [address]    Culture   Final    NO GROWTH 5 DAYS Performed at [facility], [address], [place], [place] [address]    Report Status 12/26/2017 FINAL  Final  Urine culture     Status: Abnormal   Collection Time: 12/21/17  5:57 PM  Result Value Ref Range Status   Specimen Description   Final    URINE, CLEAN CATCH Performed at [facility], [address], [place], [place] [address]    Special Requests   Final    NONE Performed at [facility], [address] [place], [place] [address]    Culture >=100,000 COLONIES/mL PROTEUS MIRABILIS (A)  Final   Report Status 12/24/2017 FINAL  Final   Organism ID, Bacteria PROTEUS MIRABILIS (A)  Final      Susceptibility   Proteus mirabilis - MIC*    AMPICILLIN <=2  SENSITIVE Sensitive     CEFAZOLIN <=4 SENSITIVE Sensitive     CEFTRIAXONE <=1 SENSITIVE Sensitive     CIPROFLOXACIN >=4 RESISTANT Resistant     GENTAMICIN <=1 SENSITIVE Sensitive     IMIPENEM 4 SENSITIVE Sensitive     NITROFURANTOIN 128 RESISTANT Resistant     TRIMETH/SULFA 40 SENSITIVE Sensitive     AMPICILLIN/SULBACTAM <=2 SENSITIVE Sensitive     PIP/TAZO <=4 SENSITIVE Sensitive     * >=100,000 COLONIES/mL PROTEUS MIRABILIS  Blood Cultures (routine x 2)     Status: None   Collection Time: 12/21/17  7:46 PM  Result Value Ref Range Status   Specimen Description   Final    BLOOD RIGHT HAND Performed at [facility], [address] [place], [place] [address]    Special Requests   Final    BOTTLES DRAWN AEROBIC ONLY Blood Culture results may not be optimal due to an inadequate volume of blood received in culture bottles Performed at [facility], [address], [place], [place] [address]    Culture   Final    NO GROWTH 5 DAYS Performed at [facility], [address], [place], [place] [address]    Report Status  12/26/2017 FINAL  Final  MRSA PCR Screening     Status: None   Collection Time: 12/21/17 10:49 PM  Result Value Ref Range Status   MRSA by PCR NEGATIVE NEGATIVE Final    Comment:        The GeneXpert MRSA Assay (FDA approved for NASAL specimens only), is one component of a comprehensive MRSA colonization surveillance program. It is not intended to diagnose MRSA infection nor to guide or monitor treatment for MRSA infections. Performed at [facility], [address] [place], [place] [id number]     SIGNED:   Jacquelin Hawkingalph Nettey, MD [facility] 12/29/2017, 3:40 PM

## 2017-12-29 NOTE — Progress Notes (Signed)
12/27/2017 4:07 PM Patient's daughter text CSW requesting update. CSW texted patient's daughter and informed her that patient[facility] only bed [facility], patient's daughter did not reply.   CSW contacted patient's daughter Loney Loh([name] Lovett [phone]) to discuss patient's discharge planning and SNF option, no answer. CSW left voicemail requesting return phone call.  CSW awaiting return call from patient's daughter to discuss discharge planning further. Patient currently has one SNF option and will need family to make decisions.   Celso SickleKimberly Jameka Ivie, [place] Clinical Social Worker [facility] Cell#: [phone]

## 2017-12-29 NOTE — Clinical Social Work Placement (Signed)
Patient received and accepted bed offer at [facility]. Facility aware of patient's discharge and confirmed bed offer. PTAR contacted, patient's family notified. Patient's RN can call report to [phone] Room 418B, packet complete. CSW signing off, no other needs identified at this time.  CLINICAL SOCIAL WORK PLACEMENT  NOTE  Date:  12/29/2017  Patient Details  Name: William EvertsHenry S Bisch MRN: [id number] Date of Birth: 22-Apr-1934  Clinical Social Work is seeking post-discharge placement for this patient at the Skilled  Nursing Facility level of care (*CSW will initial, date and re-position this form in  chart as items are completed):  Yes   Patient/family provided with [place] Clinical Social Work Department's list of facilities offering this level of care within the geographic area requested by the patient (or if unable, by the patient's family).  Yes   Patient/family informed of their freedom to choose among providers that offer the needed level of care, that participate in Medicare, Medicaid or managed care program needed by the patient, have an available bed and are willing to accept the patient.  Yes   Patient/family informed of [facility]'s ownership interest in [facility] and [facility], as well as of the fact that they are under no obligation to receive care at these facilities.  PASRR submitted to EDS on       PASRR number received on       Existing PASRR number confirmed on 12/24/17     FL2 transmitted to all facilities in geographic area requested by pt/family on 12/24/17     FL2 transmitted to all facilities within larger geographic area on       Patient informed that his/her managed care company has contracts with or will negotiate with certain facilities, including the following:        Yes   Patient/family informed of bed offers received.  [facility]     Physician recommends and patient chooses bed at      Patient to be  transferred to [facility] on 12/29/17.  Patient to be transferred to facility by PTAR     Patient family notified on 12/29/17 of transfer.  Name of family member notified:  Loney LohShannon Lovett     PHYSICIAN       Additional Comment:    _______________________________________________ Antionette PolesKimberly L Moriya Mitchell, LCSW 12/29/2017, 4:21 PM

## 2017-12-29 NOTE — Progress Notes (Signed)
CSW following to assist with discharge planning to SNF.  CSW received call from patient's daughter Loney Loh([name] Lovett) and provided update about SNF option. Patient's daughter reported that she is agreeable to patient discharging to [facility] under medicaid pending status. Patient's daughter reported that patient's granddaughter can do patient's paperwork at SNF.   CS[facility] up with [facility]. Staff member Chantell reported that they are able to accept patient under medicaid pending status.   CSW updated patient's attending MD.   CSW will continue to follow and assist with discharge planning.  Celso SickleKimberly Jobina Maita, [place] Clinical Social Worker [facility] Cell#: [phone]

## 2017-12-29 NOTE — Progress Notes (Signed)
Discharge instructions (including medications) discussed with and copy provided to patient/caregiver. Report given to [place] [facility] with no questions or concerns. AVS given to PTAR.

## 2017-12-30 DIAGNOSIS — M6281 Muscle weakness (generalized): Secondary | ICD-10-CM | POA: Diagnosis not present

## 2017-12-30 DIAGNOSIS — F0280 Dementia in other diseases classified elsewhere without behavioral disturbance: Secondary | ICD-10-CM | POA: Diagnosis not present

## 2017-12-30 DIAGNOSIS — R41841 Cognitive communication deficit: Secondary | ICD-10-CM | POA: Diagnosis not present

## 2017-12-30 DIAGNOSIS — N390 Urinary tract infection, site not specified: Secondary | ICD-10-CM | POA: Diagnosis not present

## 2017-12-31 DIAGNOSIS — R296 Repeated falls: Secondary | ICD-10-CM | POA: Diagnosis not present

## 2017-12-31 DIAGNOSIS — M545 Low back pain: Secondary | ICD-10-CM | POA: Diagnosis not present

## 2017-12-31 DIAGNOSIS — E785 Hyperlipidemia, unspecified: Secondary | ICD-10-CM | POA: Diagnosis not present

## 2017-12-31 DIAGNOSIS — N390 Urinary tract infection, site not specified: Secondary | ICD-10-CM | POA: Diagnosis not present

## 2017-12-31 DIAGNOSIS — M62838 Other muscle spasm: Secondary | ICD-10-CM | POA: Diagnosis not present

## 2017-12-31 DIAGNOSIS — F0280 Dementia in other diseases classified elsewhere without behavioral disturbance: Secondary | ICD-10-CM | POA: Diagnosis not present

## 2017-12-31 DIAGNOSIS — K219 Gastro-esophageal reflux disease without esophagitis: Secondary | ICD-10-CM | POA: Diagnosis not present

## 2017-12-31 DIAGNOSIS — I2510 Atherosclerotic heart disease of native coronary artery without angina pectoris: Secondary | ICD-10-CM | POA: Diagnosis not present

## 2017-12-31 DIAGNOSIS — Z8673 Personal history of transient ischemic attack (TIA), and cerebral infarction without residual deficits: Secondary | ICD-10-CM | POA: Diagnosis not present

## 2017-12-31 DIAGNOSIS — I10 Essential (primary) hypertension: Secondary | ICD-10-CM | POA: Diagnosis not present

## 2017-12-31 DIAGNOSIS — R41841 Cognitive communication deficit: Secondary | ICD-10-CM | POA: Diagnosis not present

## 2017-12-31 DIAGNOSIS — E119 Type 2 diabetes mellitus without complications: Secondary | ICD-10-CM | POA: Diagnosis not present

## 2017-12-31 DIAGNOSIS — M6281 Muscle weakness (generalized): Secondary | ICD-10-CM | POA: Diagnosis not present

## 2018-01-03 DIAGNOSIS — M6281 Muscle weakness (generalized): Secondary | ICD-10-CM | POA: Diagnosis not present

## 2018-01-03 DIAGNOSIS — R41841 Cognitive communication deficit: Secondary | ICD-10-CM | POA: Diagnosis not present

## 2018-01-03 DIAGNOSIS — F0280 Dementia in other diseases classified elsewhere without behavioral disturbance: Secondary | ICD-10-CM | POA: Diagnosis not present

## 2018-01-03 DIAGNOSIS — N390 Urinary tract infection, site not specified: Secondary | ICD-10-CM | POA: Diagnosis not present

## 2018-01-04 DIAGNOSIS — N390 Urinary tract infection, site not specified: Secondary | ICD-10-CM | POA: Diagnosis not present

## 2018-01-04 DIAGNOSIS — F0280 Dementia in other diseases classified elsewhere without behavioral disturbance: Secondary | ICD-10-CM | POA: Diagnosis not present

## 2018-01-04 DIAGNOSIS — M6281 Muscle weakness (generalized): Secondary | ICD-10-CM | POA: Diagnosis not present

## 2018-01-04 DIAGNOSIS — R41841 Cognitive communication deficit: Secondary | ICD-10-CM | POA: Diagnosis not present

## 2018-01-05 DIAGNOSIS — R41841 Cognitive communication deficit: Secondary | ICD-10-CM | POA: Diagnosis not present

## 2018-01-05 DIAGNOSIS — Z794 Long term (current) use of insulin: Secondary | ICD-10-CM | POA: Diagnosis not present

## 2018-01-05 DIAGNOSIS — G9341 Metabolic encephalopathy: Secondary | ICD-10-CM | POA: Diagnosis not present

## 2018-01-05 DIAGNOSIS — N390 Urinary tract infection, site not specified: Secondary | ICD-10-CM | POA: Diagnosis not present

## 2018-01-05 DIAGNOSIS — F338 Other recurrent depressive disorders: Secondary | ICD-10-CM | POA: Diagnosis not present

## 2018-01-05 DIAGNOSIS — M6281 Muscle weakness (generalized): Secondary | ICD-10-CM | POA: Diagnosis not present

## 2018-01-05 DIAGNOSIS — E1159 Type 2 diabetes mellitus with other circulatory complications: Secondary | ICD-10-CM | POA: Diagnosis not present

## 2018-01-05 DIAGNOSIS — F0280 Dementia in other diseases classified elsewhere without behavioral disturbance: Secondary | ICD-10-CM | POA: Diagnosis not present

## 2018-01-06 DIAGNOSIS — R41841 Cognitive communication deficit: Secondary | ICD-10-CM | POA: Diagnosis not present

## 2018-01-06 DIAGNOSIS — F0280 Dementia in other diseases classified elsewhere without behavioral disturbance: Secondary | ICD-10-CM | POA: Diagnosis not present

## 2018-01-06 DIAGNOSIS — M6281 Muscle weakness (generalized): Secondary | ICD-10-CM | POA: Diagnosis not present

## 2018-01-06 DIAGNOSIS — N390 Urinary tract infection, site not specified: Secondary | ICD-10-CM | POA: Diagnosis not present

## 2018-01-07 DIAGNOSIS — M6281 Muscle weakness (generalized): Secondary | ICD-10-CM | POA: Diagnosis not present

## 2018-01-07 DIAGNOSIS — F0280 Dementia in other diseases classified elsewhere without behavioral disturbance: Secondary | ICD-10-CM | POA: Diagnosis not present

## 2018-01-07 DIAGNOSIS — R41841 Cognitive communication deficit: Secondary | ICD-10-CM | POA: Diagnosis not present

## 2018-01-07 DIAGNOSIS — N390 Urinary tract infection, site not specified: Secondary | ICD-10-CM | POA: Diagnosis not present

## 2018-01-08 DIAGNOSIS — N390 Urinary tract infection, site not specified: Secondary | ICD-10-CM | POA: Diagnosis not present

## 2018-01-08 DIAGNOSIS — F0280 Dementia in other diseases classified elsewhere without behavioral disturbance: Secondary | ICD-10-CM | POA: Diagnosis not present

## 2018-01-08 DIAGNOSIS — M6281 Muscle weakness (generalized): Secondary | ICD-10-CM | POA: Diagnosis not present

## 2018-01-08 DIAGNOSIS — R41841 Cognitive communication deficit: Secondary | ICD-10-CM | POA: Diagnosis not present

## 2018-01-10 DIAGNOSIS — F0280 Dementia in other diseases classified elsewhere without behavioral disturbance: Secondary | ICD-10-CM | POA: Diagnosis not present

## 2018-01-10 DIAGNOSIS — I739 Peripheral vascular disease, unspecified: Secondary | ICD-10-CM | POA: Diagnosis not present

## 2018-01-10 DIAGNOSIS — R41841 Cognitive communication deficit: Secondary | ICD-10-CM | POA: Diagnosis not present

## 2018-01-10 DIAGNOSIS — M6281 Muscle weakness (generalized): Secondary | ICD-10-CM | POA: Diagnosis not present

## 2018-01-10 DIAGNOSIS — N390 Urinary tract infection, site not specified: Secondary | ICD-10-CM | POA: Diagnosis not present

## 2018-01-11 DIAGNOSIS — M6281 Muscle weakness (generalized): Secondary | ICD-10-CM | POA: Diagnosis not present

## 2018-01-11 DIAGNOSIS — F0280 Dementia in other diseases classified elsewhere without behavioral disturbance: Secondary | ICD-10-CM | POA: Diagnosis not present

## 2018-01-11 DIAGNOSIS — R41841 Cognitive communication deficit: Secondary | ICD-10-CM | POA: Diagnosis not present

## 2018-01-11 DIAGNOSIS — N390 Urinary tract infection, site not specified: Secondary | ICD-10-CM | POA: Diagnosis not present

## 2018-01-12 DIAGNOSIS — F0280 Dementia in other diseases classified elsewhere without behavioral disturbance: Secondary | ICD-10-CM | POA: Diagnosis not present

## 2018-01-12 DIAGNOSIS — M6281 Muscle weakness (generalized): Secondary | ICD-10-CM | POA: Diagnosis not present

## 2018-01-12 DIAGNOSIS — N390 Urinary tract infection, site not specified: Secondary | ICD-10-CM | POA: Diagnosis not present

## 2018-01-12 DIAGNOSIS — R41841 Cognitive communication deficit: Secondary | ICD-10-CM | POA: Diagnosis not present

## 2018-01-13 DIAGNOSIS — N390 Urinary tract infection, site not specified: Secondary | ICD-10-CM | POA: Diagnosis not present

## 2018-01-13 DIAGNOSIS — M6281 Muscle weakness (generalized): Secondary | ICD-10-CM | POA: Diagnosis not present

## 2018-01-13 DIAGNOSIS — R41841 Cognitive communication deficit: Secondary | ICD-10-CM | POA: Diagnosis not present

## 2018-01-13 DIAGNOSIS — F0280 Dementia in other diseases classified elsewhere without behavioral disturbance: Secondary | ICD-10-CM | POA: Diagnosis not present

## 2018-01-14 DIAGNOSIS — N390 Urinary tract infection, site not specified: Secondary | ICD-10-CM | POA: Diagnosis not present

## 2018-01-14 DIAGNOSIS — R41841 Cognitive communication deficit: Secondary | ICD-10-CM | POA: Diagnosis not present

## 2018-01-14 DIAGNOSIS — F0280 Dementia in other diseases classified elsewhere without behavioral disturbance: Secondary | ICD-10-CM | POA: Diagnosis not present

## 2018-01-14 DIAGNOSIS — M6281 Muscle weakness (generalized): Secondary | ICD-10-CM | POA: Diagnosis not present

## 2018-01-15 DIAGNOSIS — R41841 Cognitive communication deficit: Secondary | ICD-10-CM | POA: Diagnosis not present

## 2018-01-15 DIAGNOSIS — N390 Urinary tract infection, site not specified: Secondary | ICD-10-CM | POA: Diagnosis not present

## 2018-01-15 DIAGNOSIS — F0280 Dementia in other diseases classified elsewhere without behavioral disturbance: Secondary | ICD-10-CM | POA: Diagnosis not present

## 2018-01-15 DIAGNOSIS — M6281 Muscle weakness (generalized): Secondary | ICD-10-CM | POA: Diagnosis not present

## 2018-01-17 DIAGNOSIS — R41841 Cognitive communication deficit: Secondary | ICD-10-CM | POA: Diagnosis not present

## 2018-01-17 DIAGNOSIS — N390 Urinary tract infection, site not specified: Secondary | ICD-10-CM | POA: Diagnosis not present

## 2018-01-17 DIAGNOSIS — F0280 Dementia in other diseases classified elsewhere without behavioral disturbance: Secondary | ICD-10-CM | POA: Diagnosis not present

## 2018-01-17 DIAGNOSIS — M6281 Muscle weakness (generalized): Secondary | ICD-10-CM | POA: Diagnosis not present

## 2018-01-18 DIAGNOSIS — F0280 Dementia in other diseases classified elsewhere without behavioral disturbance: Secondary | ICD-10-CM | POA: Diagnosis not present

## 2018-01-18 DIAGNOSIS — M6281 Muscle weakness (generalized): Secondary | ICD-10-CM | POA: Diagnosis not present

## 2018-01-18 DIAGNOSIS — N390 Urinary tract infection, site not specified: Secondary | ICD-10-CM | POA: Diagnosis not present

## 2018-01-18 DIAGNOSIS — R41841 Cognitive communication deficit: Secondary | ICD-10-CM | POA: Diagnosis not present

## 2018-01-19 DIAGNOSIS — R41841 Cognitive communication deficit: Secondary | ICD-10-CM | POA: Diagnosis not present

## 2018-01-19 DIAGNOSIS — M6281 Muscle weakness (generalized): Secondary | ICD-10-CM | POA: Diagnosis not present

## 2018-01-19 DIAGNOSIS — F0280 Dementia in other diseases classified elsewhere without behavioral disturbance: Secondary | ICD-10-CM | POA: Diagnosis not present

## 2018-01-19 DIAGNOSIS — E441 Mild protein-calorie malnutrition: Secondary | ICD-10-CM | POA: Diagnosis not present

## 2018-01-19 DIAGNOSIS — N390 Urinary tract infection, site not specified: Secondary | ICD-10-CM | POA: Diagnosis not present

## 2018-01-20 DIAGNOSIS — N390 Urinary tract infection, site not specified: Secondary | ICD-10-CM | POA: Diagnosis not present

## 2018-01-20 DIAGNOSIS — F0280 Dementia in other diseases classified elsewhere without behavioral disturbance: Secondary | ICD-10-CM | POA: Diagnosis not present

## 2018-01-20 DIAGNOSIS — M6281 Muscle weakness (generalized): Secondary | ICD-10-CM | POA: Diagnosis not present

## 2018-01-20 DIAGNOSIS — R41841 Cognitive communication deficit: Secondary | ICD-10-CM | POA: Diagnosis not present

## 2018-01-21 DIAGNOSIS — R41841 Cognitive communication deficit: Secondary | ICD-10-CM | POA: Diagnosis not present

## 2018-01-21 DIAGNOSIS — N390 Urinary tract infection, site not specified: Secondary | ICD-10-CM | POA: Diagnosis not present

## 2018-01-21 DIAGNOSIS — F0280 Dementia in other diseases classified elsewhere without behavioral disturbance: Secondary | ICD-10-CM | POA: Diagnosis not present

## 2018-01-21 DIAGNOSIS — M6281 Muscle weakness (generalized): Secondary | ICD-10-CM | POA: Diagnosis not present

## 2018-01-22 DIAGNOSIS — F0280 Dementia in other diseases classified elsewhere without behavioral disturbance: Secondary | ICD-10-CM | POA: Diagnosis not present

## 2018-01-22 DIAGNOSIS — N390 Urinary tract infection, site not specified: Secondary | ICD-10-CM | POA: Diagnosis not present

## 2018-01-22 DIAGNOSIS — R41841 Cognitive communication deficit: Secondary | ICD-10-CM | POA: Diagnosis not present

## 2018-01-22 DIAGNOSIS — M6281 Muscle weakness (generalized): Secondary | ICD-10-CM | POA: Diagnosis not present

## 2018-01-24 DIAGNOSIS — N390 Urinary tract infection, site not specified: Secondary | ICD-10-CM | POA: Diagnosis not present

## 2018-01-24 DIAGNOSIS — R41841 Cognitive communication deficit: Secondary | ICD-10-CM | POA: Diagnosis not present

## 2018-01-24 DIAGNOSIS — F0280 Dementia in other diseases classified elsewhere without behavioral disturbance: Secondary | ICD-10-CM | POA: Diagnosis not present

## 2018-01-24 DIAGNOSIS — R278 Other lack of coordination: Secondary | ICD-10-CM | POA: Diagnosis not present

## 2018-01-24 DIAGNOSIS — M6281 Muscle weakness (generalized): Secondary | ICD-10-CM | POA: Diagnosis not present

## 2018-01-25 DIAGNOSIS — N390 Urinary tract infection, site not specified: Secondary | ICD-10-CM | POA: Diagnosis not present

## 2018-01-25 DIAGNOSIS — R41841 Cognitive communication deficit: Secondary | ICD-10-CM | POA: Diagnosis not present

## 2018-01-25 DIAGNOSIS — F0280 Dementia in other diseases classified elsewhere without behavioral disturbance: Secondary | ICD-10-CM | POA: Diagnosis not present

## 2018-01-25 DIAGNOSIS — M6281 Muscle weakness (generalized): Secondary | ICD-10-CM | POA: Diagnosis not present

## 2018-01-26 DIAGNOSIS — M6281 Muscle weakness (generalized): Secondary | ICD-10-CM | POA: Diagnosis not present

## 2018-01-26 DIAGNOSIS — N390 Urinary tract infection, site not specified: Secondary | ICD-10-CM | POA: Diagnosis not present

## 2018-01-26 DIAGNOSIS — F0280 Dementia in other diseases classified elsewhere without behavioral disturbance: Secondary | ICD-10-CM | POA: Diagnosis not present

## 2018-01-26 DIAGNOSIS — R41841 Cognitive communication deficit: Secondary | ICD-10-CM | POA: Diagnosis not present

## 2018-01-27 DIAGNOSIS — M6281 Muscle weakness (generalized): Secondary | ICD-10-CM | POA: Diagnosis not present

## 2018-01-27 DIAGNOSIS — F0280 Dementia in other diseases classified elsewhere without behavioral disturbance: Secondary | ICD-10-CM | POA: Diagnosis not present

## 2018-01-27 DIAGNOSIS — N390 Urinary tract infection, site not specified: Secondary | ICD-10-CM | POA: Diagnosis not present

## 2018-01-27 DIAGNOSIS — R41841 Cognitive communication deficit: Secondary | ICD-10-CM | POA: Diagnosis not present

## 2018-01-28 DIAGNOSIS — M6281 Muscle weakness (generalized): Secondary | ICD-10-CM | POA: Diagnosis not present

## 2018-01-28 DIAGNOSIS — N390 Urinary tract infection, site not specified: Secondary | ICD-10-CM | POA: Diagnosis not present

## 2018-01-28 DIAGNOSIS — R41841 Cognitive communication deficit: Secondary | ICD-10-CM | POA: Diagnosis not present

## 2018-01-28 DIAGNOSIS — F0280 Dementia in other diseases classified elsewhere without behavioral disturbance: Secondary | ICD-10-CM | POA: Diagnosis not present

## 2018-01-31 DIAGNOSIS — N390 Urinary tract infection, site not specified: Secondary | ICD-10-CM | POA: Diagnosis not present

## 2018-01-31 DIAGNOSIS — R41841 Cognitive communication deficit: Secondary | ICD-10-CM | POA: Diagnosis not present

## 2018-01-31 DIAGNOSIS — F0280 Dementia in other diseases classified elsewhere without behavioral disturbance: Secondary | ICD-10-CM | POA: Diagnosis not present

## 2018-01-31 DIAGNOSIS — M6281 Muscle weakness (generalized): Secondary | ICD-10-CM | POA: Diagnosis not present

## 2018-02-01 DIAGNOSIS — N390 Urinary tract infection, site not specified: Secondary | ICD-10-CM | POA: Diagnosis not present

## 2018-02-01 DIAGNOSIS — R41841 Cognitive communication deficit: Secondary | ICD-10-CM | POA: Diagnosis not present

## 2018-02-01 DIAGNOSIS — F0280 Dementia in other diseases classified elsewhere without behavioral disturbance: Secondary | ICD-10-CM | POA: Diagnosis not present

## 2018-02-01 DIAGNOSIS — M6281 Muscle weakness (generalized): Secondary | ICD-10-CM | POA: Diagnosis not present

## 2018-02-22 DIAGNOSIS — W19XXXA Unspecified fall, initial encounter: Secondary | ICD-10-CM | POA: Diagnosis not present

## 2018-02-22 DIAGNOSIS — M542 Cervicalgia: Secondary | ICD-10-CM | POA: Diagnosis not present

## 2018-02-23 DIAGNOSIS — Z794 Long term (current) use of insulin: Secondary | ICD-10-CM | POA: Diagnosis not present

## 2018-02-23 DIAGNOSIS — F0150 Vascular dementia without behavioral disturbance: Secondary | ICD-10-CM | POA: Diagnosis not present

## 2018-02-23 DIAGNOSIS — R296 Repeated falls: Secondary | ICD-10-CM | POA: Diagnosis not present

## 2018-02-23 DIAGNOSIS — I2510 Atherosclerotic heart disease of native coronary artery without angina pectoris: Secondary | ICD-10-CM | POA: Diagnosis not present

## 2018-02-24 DIAGNOSIS — R278 Other lack of coordination: Secondary | ICD-10-CM | POA: Diagnosis not present

## 2018-02-24 DIAGNOSIS — M6281 Muscle weakness (generalized): Secondary | ICD-10-CM | POA: Diagnosis not present

## 2018-03-02 DIAGNOSIS — E7849 Other hyperlipidemia: Secondary | ICD-10-CM | POA: Diagnosis not present

## 2018-03-02 DIAGNOSIS — E08319 Diabetes mellitus due to underlying condition with unspecified diabetic retinopathy without macular edema: Secondary | ICD-10-CM | POA: Diagnosis not present

## 2018-03-12 DIAGNOSIS — N390 Urinary tract infection, site not specified: Secondary | ICD-10-CM | POA: Diagnosis not present

## 2018-03-12 DIAGNOSIS — F0280 Dementia in other diseases classified elsewhere without behavioral disturbance: Secondary | ICD-10-CM | POA: Diagnosis not present

## 2018-03-12 DIAGNOSIS — R2689 Other abnormalities of gait and mobility: Secondary | ICD-10-CM | POA: Diagnosis not present

## 2018-03-12 DIAGNOSIS — M6281 Muscle weakness (generalized): Secondary | ICD-10-CM | POA: Diagnosis not present

## 2018-03-12 DIAGNOSIS — R2681 Unsteadiness on feet: Secondary | ICD-10-CM | POA: Diagnosis not present

## 2018-03-15 DIAGNOSIS — R296 Repeated falls: Secondary | ICD-10-CM | POA: Diagnosis not present

## 2018-03-15 DIAGNOSIS — R2681 Unsteadiness on feet: Secondary | ICD-10-CM | POA: Diagnosis not present

## 2018-03-16 DIAGNOSIS — R2681 Unsteadiness on feet: Secondary | ICD-10-CM | POA: Diagnosis not present

## 2018-03-16 DIAGNOSIS — R296 Repeated falls: Secondary | ICD-10-CM | POA: Diagnosis not present

## 2018-03-17 DIAGNOSIS — R2681 Unsteadiness on feet: Secondary | ICD-10-CM | POA: Diagnosis not present

## 2018-03-17 DIAGNOSIS — R296 Repeated falls: Secondary | ICD-10-CM | POA: Diagnosis not present

## 2018-03-18 DIAGNOSIS — R296 Repeated falls: Secondary | ICD-10-CM | POA: Diagnosis not present

## 2018-03-18 DIAGNOSIS — R2681 Unsteadiness on feet: Secondary | ICD-10-CM | POA: Diagnosis not present

## 2018-03-20 DIAGNOSIS — R2681 Unsteadiness on feet: Secondary | ICD-10-CM | POA: Diagnosis not present

## 2018-03-20 DIAGNOSIS — R296 Repeated falls: Secondary | ICD-10-CM | POA: Diagnosis not present

## 2018-03-22 DIAGNOSIS — R296 Repeated falls: Secondary | ICD-10-CM | POA: Diagnosis not present

## 2018-03-22 DIAGNOSIS — R2681 Unsteadiness on feet: Secondary | ICD-10-CM | POA: Diagnosis not present

## 2018-03-23 DIAGNOSIS — R2681 Unsteadiness on feet: Secondary | ICD-10-CM | POA: Diagnosis not present

## 2018-03-23 DIAGNOSIS — R296 Repeated falls: Secondary | ICD-10-CM | POA: Diagnosis not present

## 2018-03-24 DIAGNOSIS — R296 Repeated falls: Secondary | ICD-10-CM | POA: Diagnosis not present

## 2018-03-24 DIAGNOSIS — R2681 Unsteadiness on feet: Secondary | ICD-10-CM | POA: Diagnosis not present

## 2018-03-25 DIAGNOSIS — R296 Repeated falls: Secondary | ICD-10-CM | POA: Diagnosis not present

## 2018-03-25 DIAGNOSIS — R2681 Unsteadiness on feet: Secondary | ICD-10-CM | POA: Diagnosis not present

## 2018-03-26 DIAGNOSIS — M6281 Muscle weakness (generalized): Secondary | ICD-10-CM | POA: Diagnosis not present

## 2018-03-26 DIAGNOSIS — R278 Other lack of coordination: Secondary | ICD-10-CM | POA: Diagnosis not present

## 2018-03-28 DIAGNOSIS — I10 Essential (primary) hypertension: Secondary | ICD-10-CM | POA: Diagnosis not present

## 2018-03-28 DIAGNOSIS — E1159 Type 2 diabetes mellitus with other circulatory complications: Secondary | ICD-10-CM | POA: Diagnosis not present

## 2018-03-28 DIAGNOSIS — F338 Other recurrent depressive disorders: Secondary | ICD-10-CM | POA: Diagnosis not present

## 2018-03-28 DIAGNOSIS — R2681 Unsteadiness on feet: Secondary | ICD-10-CM | POA: Diagnosis not present

## 2018-03-28 DIAGNOSIS — R296 Repeated falls: Secondary | ICD-10-CM | POA: Diagnosis not present

## 2018-03-28 DIAGNOSIS — I25118 Atherosclerotic heart disease of native coronary artery with other forms of angina pectoris: Secondary | ICD-10-CM | POA: Diagnosis not present

## 2018-03-29 DIAGNOSIS — R2681 Unsteadiness on feet: Secondary | ICD-10-CM | POA: Diagnosis not present

## 2018-03-29 DIAGNOSIS — R296 Repeated falls: Secondary | ICD-10-CM | POA: Diagnosis not present

## 2018-03-30 DIAGNOSIS — R2681 Unsteadiness on feet: Secondary | ICD-10-CM | POA: Diagnosis not present

## 2018-03-30 DIAGNOSIS — R296 Repeated falls: Secondary | ICD-10-CM | POA: Diagnosis not present

## 2018-04-11 DIAGNOSIS — K219 Gastro-esophageal reflux disease without esophagitis: Secondary | ICD-10-CM | POA: Diagnosis not present

## 2018-04-11 DIAGNOSIS — E1159 Type 2 diabetes mellitus with other circulatory complications: Secondary | ICD-10-CM | POA: Diagnosis not present

## 2018-04-11 DIAGNOSIS — I25118 Atherosclerotic heart disease of native coronary artery with other forms of angina pectoris: Secondary | ICD-10-CM | POA: Diagnosis not present

## 2018-04-11 DIAGNOSIS — I10 Essential (primary) hypertension: Secondary | ICD-10-CM | POA: Diagnosis not present

## 2018-04-14 DIAGNOSIS — E8881 Metabolic syndrome: Secondary | ICD-10-CM | POA: Diagnosis not present

## 2018-04-14 DIAGNOSIS — F0150 Vascular dementia without behavioral disturbance: Secondary | ICD-10-CM | POA: Diagnosis not present

## 2018-04-14 DIAGNOSIS — I10 Essential (primary) hypertension: Secondary | ICD-10-CM | POA: Diagnosis not present

## 2018-04-14 DIAGNOSIS — Z9114 Patient's other noncompliance with medication regimen: Secondary | ICD-10-CM | POA: Diagnosis not present

## 2018-04-19 ENCOUNTER — Telehealth: Payer: Self-pay | Admitting: *Deleted

## 2018-04-19 ENCOUNTER — Ambulatory Visit: Payer: Medicare HMO | Admitting: Adult Health

## 2018-04-19 NOTE — Telephone Encounter (Signed)
Patient was no show for follow up with NP today.  

## 2018-04-19 NOTE — Progress Notes (Deleted)
PATIENT: William Aguirre DOB: 11-02-1934  REASON FOR VISIT: follow up HISTORY FROM: patient  HISTORY OF PRESENT ILLNESS: Today 04/19/18  HISTORY William Aguirre is an 84 year old right-handed black male with a history of a progressive memory disturbance.  The patient has had a significant change in his memory over the last 1 year, in July 2018, he was scoring 26/30 on his Mini-Mental status examination, now he is scoring 16/30.  The patient is residing at [facility], he has had documented mild quadriparesis, he was found to have spinal cord compression on MRI of the cervical spine.  He was referred to neurosurgery, he saw Dr. Lovell Sheehan, but the family did not wish to consider surgery for him.  The patient is using a wheelchair alternating with a walker.  He has trouble getting up from a chair.  He is having weakness in the hands, right greater than left.  He is having fecal and urinary incontinence, he wears adult diapers.  The patient requires assistance with bathing and dressing, he can feed himself.  He is having some delusional thinking that he is having surgery on his neck at any moment.  He is not having significant agitation according to the family.  The patient returns to this office for an evaluation.  He remains on low-dose Aricept, apparently he is tolerating this well.   REVIEW OF SYSTEMS: Out of a complete 14 system review of symptoms, the patient complains only of the following symptoms, and all other reviewed systems are negative.  ALLERGIES: Allergies  Allergen Reactions  . Hydrocodone-Acetaminophen Other (See Comments)     Delusions    HOME MEDICATIONS: Outpatient Medications Prior to Visit  Medication Sig Dispense Refill  . acetaminophen (TYLENOL) 325 MG tablet Take 2 tablets (650 mg total) by mouth every 6 (six) hours as needed for mild pain (or Fever >/= 101).    Marland Kitchen aspirin (SM ASPIRIN ADULT LOW STRENGTH) 81 MG EC tablet Take 1 tablet (81 mg total) by mouth daily. Swallow  whole. 90 tablet 2  . atorvastatin (LIPITOR) 80 MG tablet TAKE 1 TABLET BY MOUTH ONCE DAILY (Patient taking differently: TAKE 80 mg TABLET BY MOUTH ONCE DAILY) 30 tablet 8  . bisoprolol (ZEBETA) 5 MG tablet Take 1 tablet (5 mg total) by mouth 2 (two) times daily. (Patient taking differently: Take 5 mg by mouth daily. ) 60 tablet 3  . clopidogrel (PLAVIX) 75 MG tablet TAKE 1 TABLET BY MOUTH ONCE DAILY WITH BREAKFAST (Patient taking differently: TAKE 75 mg TABLET BY MOUTH ONCE DAILY WITH BREAKFAST) 90 tablet 3  . cyclobenzaprine (FLEXERIL) 5 MG tablet Take 1 tablet (5 mg total) by mouth 2 (two) times daily as needed for muscle spasms. (Patient not taking: Reported on 12/21/2017) 16 tablet 0  . Dextromethorphan-guaiFENesin (TUSSIN DM) 10-100 MG/5ML liquid Take 5 mLs by mouth every 12 (twelve) hours.    Marland Kitchen donepezil (ARICEPT) 10 MG tablet Take 1 tablet (10 mg total) by mouth at bedtime. 90 tablet 3  . insulin aspart (NOVOLOG) 100 UNIT/ML injection Inject 3 Units into the skin 3 (three) times daily with meals. Only if cbg over 300 10 mL 11  . Insulin Detemir (LEVEMIR FLEXTOUCH) 100 UNIT/ML Pen Inject 10 Units into the skin every morning. 20 mL 11  . losartan (COZAAR) 100 MG tablet Take 1 tablet (100 mg total) by mouth daily.    . ondansetron (ZOFRAN) 4 MG tablet Take 1 tablet (4 mg total) by mouth every 8 (eight) hours as needed  for nausea or vomiting. (Patient not taking: Reported on 11/16/2017) 20 tablet 0  . pantoprazole (PROTONIX) 40 MG tablet Take 1 tablet (40 mg total) by mouth daily. 90 tablet 0  . sertraline (ZOLOFT) 50 MG tablet Take 50 mg by mouth at bedtime.    . traMADol (ULTRAM) 50 MG tablet Take 1 tablet (50 mg total) by mouth 2 (two) times daily as needed for severe pain. 5 tablet 0   No facility-administered medications prior to visit.     PAST MEDICAL HISTORY: Past Medical History:  Diagnosis Date  . Cervical myelopathy (HCC) 11/16/2017  . Coronary artery disease    Moderate LAD,  Diagonal, Circumflex disease by cath 2004  . Dementia   . Diabetes mellitus without complication (HCC)   . Fall at home 07/06/2016  . Hyperlipidemia   . Hypertension   . Memory loss     PAST SURGICAL HISTORY: Past Surgical History:  Procedure Laterality Date  . CATARACT EXTRACTION, BILATERAL      FAMILY HISTORY: Family History  Problem Relation Age of Onset  . Other Father        Deceased  . Other Mother        Deceased, 1379  . Other Sister        Deceased  . Cancer Sister        Deceased  . Healthy Son        Living  . Heart disease Son        Deceased, 30 from heart valve dysfunction  . Healthy Daughter     SOCIAL HISTORY: Social History   Socioeconomic History  . Marital status: Married    Spouse name: Not on file  . Number of children: 4  . Years of education: IT trainerCPA  . Highest education level: Not on file  Occupational History  . Occupation: Airline pilotAccountant    Comment: Retired  Engineer, productionocial Needs  . Financial resource strain: Not on file  . Food insecurity:    Worry: Not on file    Inability: Not on file  . Transportation needs:    Medical: Not on file    Non-medical: Not on file  Tobacco Use  . Smoking status: Former Smoker    Years: 3.00    Last attempt to quit: 04/13/1965    Years since quitting: 53.0  . Smokeless tobacco: Never Used  Substance and Sexual Activity  . Alcohol use: No    Alcohol/week: 0.0 standard drinks  . Drug use: No  . Sexual activity: Never  Lifestyle  . Physical activity:    Days per week: Not on file    Minutes per session: Not on file  . Stress: Not on file  Relationships  . Social connections:    Talks on phone: Not on file    Gets together: Not on file    Attends religious service: Not on file    Active member of club or organization: Not on file    Attends meetings of clubs or organizations: Not on file    Relationship status: Not on file  . Intimate partner violence:    Fear of current or ex partner: Not on file     Emotionally abused: Not on file    Physically abused: Not on file    Forced sexual activity: Not on file  Other Topics Concern  . Not on file  Social History Narrative   Lives with wife in a one story home.   11/16/17 [facility],  [address]   Retired IT trainerCPA.   Right-handed   Caffeine: 1 cup per day      PHYSICAL EXAM  There were no vitals filed for this visit. There is no height or weight on file to calculate BMI.  Generalized: Well developed, in no acute distress   Neurological examination  Mentation: Alert oriented to time, place, history taking. Follows all commands speech and language fluent Cranial nerve II-XII: Pupils were equal round reactive to light. Extraocular movements were full, visual field were full on confrontational test. Facial sensation and strength were normal. Uvula tongue midline. Head turning and shoulder shrug  were normal and symmetric. Motor: The motor testing reveals 5 over 5 strength of all 4 extremities. Good symmetric motor tone is noted throughout.  Sensory: Sensory testing is intact to soft touch on all 4 extremities. No evidence of extinction is noted.  Coordination: Cerebellar testing reveals good finger-nose-finger and heel-to-shin bilaterally.  Gait and station: Gait is normal. Tandem gait is normal. Romberg is negative. No drift is seen.  Reflexes: Deep tendon reflexes are symmetric and normal bilaterally.   DIAGNOSTIC DATA (LABS, IMAGING, TESTING) - I reviewed patient records, labs, notes, testing and imaging myself where available.  Lab Results  Component Value Date   WBC 11.0 (H) 12/28/2017   HGB 13.2 12/28/2017   HCT 40.3 12/28/2017   MCV 83.3 12/28/2017   PLT 351 12/28/2017      Component Value Date/Time   NA 138 12/28/2017 0352   K 4.2 12/28/2017 0352   CL 104 12/28/2017 0352   CO2 26 12/28/2017 0352   GLUCOSE 218 (H) 12/28/2017 0352   BUN 14 12/28/2017 0352   CREATININE 1.07 12/28/2017 0352   CALCIUM 9.0  12/28/2017 0352   PROT 7.0 12/22/2017 0402   PROT 7.0 05/04/2016 1542   ALBUMIN 2.6 (L) 12/22/2017 0402   AST 47 (H) 12/22/2017 0402   ALT 38 12/22/2017 0402   ALKPHOS 106 12/22/2017 0402   BILITOT 0.9 12/22/2017 0402   GFRNONAA >60 12/28/2017 0352   GFRAA >60 12/28/2017 0352   Lab Results  Component Value Date   CHOL 158 01/07/2017   HDL 31 (L) 01/07/2017   LDLCALC 97 01/07/2017   TRIG 148 01/07/2017   CHOLHDL 5.1 01/07/2017   Lab Results  Component Value Date   HGBA1C 8.7 08/27/2017   Lab Results  Component Value Date   VITAMINB12 631 05/04/2016   Lab Results  Component Value Date   TSH 1.348 12/21/2017      ASSESSMENT AND PLAN 84 y.o. year old male  has a past medical history of Cervical myelopathy (HCC) (11/16/2017), Coronary artery disease, Dementia, Diabetes mellitus without complication (HCC), Fall at home (07/06/2016), Hyperlipidemia, Hypertension, and Memory loss. here with ***   I spent 15 minutes with the patient. 50% of this time was spent   Butch PennyMegan Kylea Berrong, MSN, NP-C 04/19/2018, 7:49 AM [facility] [address], [address] [place], [place] [address] [phone]

## 2018-04-20 ENCOUNTER — Encounter: Payer: Self-pay | Admitting: Adult Health

## 2018-04-26 DIAGNOSIS — R278 Other lack of coordination: Secondary | ICD-10-CM | POA: Diagnosis not present

## 2018-04-26 DIAGNOSIS — M6281 Muscle weakness (generalized): Secondary | ICD-10-CM | POA: Diagnosis not present

## 2018-04-27 DIAGNOSIS — F0150 Vascular dementia without behavioral disturbance: Secondary | ICD-10-CM | POA: Diagnosis not present

## 2018-04-27 DIAGNOSIS — I10 Essential (primary) hypertension: Secondary | ICD-10-CM | POA: Diagnosis not present

## 2018-04-27 DIAGNOSIS — Z9114 Patient's other noncompliance with medication regimen: Secondary | ICD-10-CM | POA: Diagnosis not present

## 2018-05-05 DIAGNOSIS — R296 Repeated falls: Secondary | ICD-10-CM | POA: Diagnosis not present

## 2018-05-05 DIAGNOSIS — R2681 Unsteadiness on feet: Secondary | ICD-10-CM | POA: Diagnosis not present

## 2018-05-05 DIAGNOSIS — R2689 Other abnormalities of gait and mobility: Secondary | ICD-10-CM | POA: Diagnosis not present

## 2018-05-27 DIAGNOSIS — W19XXXA Unspecified fall, initial encounter: Secondary | ICD-10-CM | POA: Diagnosis not present

## 2018-05-27 DIAGNOSIS — M6281 Muscle weakness (generalized): Secondary | ICD-10-CM | POA: Diagnosis not present

## 2018-05-27 DIAGNOSIS — I2510 Atherosclerotic heart disease of native coronary artery without angina pectoris: Secondary | ICD-10-CM | POA: Diagnosis not present

## 2018-05-27 DIAGNOSIS — R278 Other lack of coordination: Secondary | ICD-10-CM | POA: Diagnosis not present

## 2018-05-27 DIAGNOSIS — N390 Urinary tract infection, site not specified: Secondary | ICD-10-CM | POA: Diagnosis not present

## 2018-06-10 DIAGNOSIS — N390 Urinary tract infection, site not specified: Secondary | ICD-10-CM | POA: Diagnosis not present

## 2018-06-10 DIAGNOSIS — M6281 Muscle weakness (generalized): Secondary | ICD-10-CM | POA: Diagnosis not present

## 2018-06-10 DIAGNOSIS — I2510 Atherosclerotic heart disease of native coronary artery without angina pectoris: Secondary | ICD-10-CM | POA: Diagnosis not present

## 2018-06-10 DIAGNOSIS — W19XXXA Unspecified fall, initial encounter: Secondary | ICD-10-CM | POA: Diagnosis not present

## 2018-06-11 DIAGNOSIS — N390 Urinary tract infection, site not specified: Secondary | ICD-10-CM | POA: Diagnosis not present

## 2018-06-11 DIAGNOSIS — W19XXXA Unspecified fall, initial encounter: Secondary | ICD-10-CM | POA: Diagnosis not present

## 2018-06-11 DIAGNOSIS — M6281 Muscle weakness (generalized): Secondary | ICD-10-CM | POA: Diagnosis not present

## 2018-06-11 DIAGNOSIS — I2510 Atherosclerotic heart disease of native coronary artery without angina pectoris: Secondary | ICD-10-CM | POA: Diagnosis not present

## 2018-06-12 DIAGNOSIS — M25561 Pain in right knee: Secondary | ICD-10-CM | POA: Diagnosis not present

## 2018-06-12 DIAGNOSIS — W19XXXA Unspecified fall, initial encounter: Secondary | ICD-10-CM | POA: Diagnosis not present

## 2018-06-13 DIAGNOSIS — I10 Essential (primary) hypertension: Secondary | ICD-10-CM | POA: Diagnosis not present

## 2018-06-13 DIAGNOSIS — M25561 Pain in right knee: Secondary | ICD-10-CM | POA: Diagnosis not present

## 2018-06-13 DIAGNOSIS — F0151 Vascular dementia with behavioral disturbance: Secondary | ICD-10-CM | POA: Diagnosis not present

## 2018-06-13 DIAGNOSIS — G4089 Other seizures: Secondary | ICD-10-CM | POA: Diagnosis not present

## 2018-06-13 DIAGNOSIS — E1159 Type 2 diabetes mellitus with other circulatory complications: Secondary | ICD-10-CM | POA: Diagnosis not present

## 2018-06-13 DIAGNOSIS — Z9119 Patient's noncompliance with other medical treatment and regimen: Secondary | ICD-10-CM | POA: Diagnosis not present

## 2018-06-14 DIAGNOSIS — I2510 Atherosclerotic heart disease of native coronary artery without angina pectoris: Secondary | ICD-10-CM | POA: Diagnosis not present

## 2018-06-14 DIAGNOSIS — M6281 Muscle weakness (generalized): Secondary | ICD-10-CM | POA: Diagnosis not present

## 2018-06-14 DIAGNOSIS — N390 Urinary tract infection, site not specified: Secondary | ICD-10-CM | POA: Diagnosis not present

## 2018-06-14 DIAGNOSIS — W19XXXA Unspecified fall, initial encounter: Secondary | ICD-10-CM | POA: Diagnosis not present

## 2018-06-15 DIAGNOSIS — I2510 Atherosclerotic heart disease of native coronary artery without angina pectoris: Secondary | ICD-10-CM | POA: Diagnosis not present

## 2018-06-15 DIAGNOSIS — W19XXXA Unspecified fall, initial encounter: Secondary | ICD-10-CM | POA: Diagnosis not present

## 2018-06-15 DIAGNOSIS — N390 Urinary tract infection, site not specified: Secondary | ICD-10-CM | POA: Diagnosis not present

## 2018-06-15 DIAGNOSIS — M6281 Muscle weakness (generalized): Secondary | ICD-10-CM | POA: Diagnosis not present

## 2018-06-16 DIAGNOSIS — W19XXXA Unspecified fall, initial encounter: Secondary | ICD-10-CM | POA: Diagnosis not present

## 2018-06-16 DIAGNOSIS — I2510 Atherosclerotic heart disease of native coronary artery without angina pectoris: Secondary | ICD-10-CM | POA: Diagnosis not present

## 2018-06-16 DIAGNOSIS — M6281 Muscle weakness (generalized): Secondary | ICD-10-CM | POA: Diagnosis not present

## 2018-06-16 DIAGNOSIS — N390 Urinary tract infection, site not specified: Secondary | ICD-10-CM | POA: Diagnosis not present

## 2018-06-17 DIAGNOSIS — N390 Urinary tract infection, site not specified: Secondary | ICD-10-CM | POA: Diagnosis not present

## 2018-06-17 DIAGNOSIS — M6281 Muscle weakness (generalized): Secondary | ICD-10-CM | POA: Diagnosis not present

## 2018-06-17 DIAGNOSIS — W19XXXA Unspecified fall, initial encounter: Secondary | ICD-10-CM | POA: Diagnosis not present

## 2018-06-17 DIAGNOSIS — I2510 Atherosclerotic heart disease of native coronary artery without angina pectoris: Secondary | ICD-10-CM | POA: Diagnosis not present

## 2018-06-18 DIAGNOSIS — M6281 Muscle weakness (generalized): Secondary | ICD-10-CM | POA: Diagnosis not present

## 2018-06-18 DIAGNOSIS — I2510 Atherosclerotic heart disease of native coronary artery without angina pectoris: Secondary | ICD-10-CM | POA: Diagnosis not present

## 2018-06-18 DIAGNOSIS — N390 Urinary tract infection, site not specified: Secondary | ICD-10-CM | POA: Diagnosis not present

## 2018-06-18 DIAGNOSIS — W19XXXA Unspecified fall, initial encounter: Secondary | ICD-10-CM | POA: Diagnosis not present

## 2018-06-19 DIAGNOSIS — W19XXXA Unspecified fall, initial encounter: Secondary | ICD-10-CM | POA: Diagnosis not present

## 2018-06-19 DIAGNOSIS — I2510 Atherosclerotic heart disease of native coronary artery without angina pectoris: Secondary | ICD-10-CM | POA: Diagnosis not present

## 2018-06-19 DIAGNOSIS — M6281 Muscle weakness (generalized): Secondary | ICD-10-CM | POA: Diagnosis not present

## 2018-06-19 DIAGNOSIS — N390 Urinary tract infection, site not specified: Secondary | ICD-10-CM | POA: Diagnosis not present

## 2018-06-20 DIAGNOSIS — W19XXXA Unspecified fall, initial encounter: Secondary | ICD-10-CM | POA: Diagnosis not present

## 2018-06-20 DIAGNOSIS — N390 Urinary tract infection, site not specified: Secondary | ICD-10-CM | POA: Diagnosis not present

## 2018-06-20 DIAGNOSIS — M6281 Muscle weakness (generalized): Secondary | ICD-10-CM | POA: Diagnosis not present

## 2018-06-20 DIAGNOSIS — I2510 Atherosclerotic heart disease of native coronary artery without angina pectoris: Secondary | ICD-10-CM | POA: Diagnosis not present

## 2018-06-21 DIAGNOSIS — I2510 Atherosclerotic heart disease of native coronary artery without angina pectoris: Secondary | ICD-10-CM | POA: Diagnosis not present

## 2018-06-21 DIAGNOSIS — W19XXXA Unspecified fall, initial encounter: Secondary | ICD-10-CM | POA: Diagnosis not present

## 2018-06-21 DIAGNOSIS — M6281 Muscle weakness (generalized): Secondary | ICD-10-CM | POA: Diagnosis not present

## 2018-06-21 DIAGNOSIS — N390 Urinary tract infection, site not specified: Secondary | ICD-10-CM | POA: Diagnosis not present

## 2018-06-22 DIAGNOSIS — W19XXXA Unspecified fall, initial encounter: Secondary | ICD-10-CM | POA: Diagnosis not present

## 2018-06-22 DIAGNOSIS — F0151 Vascular dementia with behavioral disturbance: Secondary | ICD-10-CM | POA: Diagnosis not present

## 2018-06-22 DIAGNOSIS — R2689 Other abnormalities of gait and mobility: Secondary | ICD-10-CM | POA: Diagnosis not present

## 2018-06-22 DIAGNOSIS — I10 Essential (primary) hypertension: Secondary | ICD-10-CM | POA: Diagnosis not present

## 2018-06-22 DIAGNOSIS — N390 Urinary tract infection, site not specified: Secondary | ICD-10-CM | POA: Diagnosis not present

## 2018-06-22 DIAGNOSIS — I7389 Other specified peripheral vascular diseases: Secondary | ICD-10-CM | POA: Diagnosis not present

## 2018-06-22 DIAGNOSIS — Z9114 Patient's other noncompliance with medication regimen: Secondary | ICD-10-CM | POA: Diagnosis not present

## 2018-06-22 DIAGNOSIS — I2510 Atherosclerotic heart disease of native coronary artery without angina pectoris: Secondary | ICD-10-CM | POA: Diagnosis not present

## 2018-06-22 DIAGNOSIS — M6281 Muscle weakness (generalized): Secondary | ICD-10-CM | POA: Diagnosis not present

## 2018-06-22 DIAGNOSIS — E1159 Type 2 diabetes mellitus with other circulatory complications: Secondary | ICD-10-CM | POA: Diagnosis not present

## 2018-06-23 DIAGNOSIS — N390 Urinary tract infection, site not specified: Secondary | ICD-10-CM | POA: Diagnosis not present

## 2018-06-23 DIAGNOSIS — M6281 Muscle weakness (generalized): Secondary | ICD-10-CM | POA: Diagnosis not present

## 2018-06-23 DIAGNOSIS — W19XXXA Unspecified fall, initial encounter: Secondary | ICD-10-CM | POA: Diagnosis not present

## 2018-06-23 DIAGNOSIS — I2510 Atherosclerotic heart disease of native coronary artery without angina pectoris: Secondary | ICD-10-CM | POA: Diagnosis not present

## 2018-06-25 DIAGNOSIS — M6281 Muscle weakness (generalized): Secondary | ICD-10-CM | POA: Diagnosis not present

## 2018-06-25 DIAGNOSIS — R278 Other lack of coordination: Secondary | ICD-10-CM | POA: Diagnosis not present

## 2018-07-01 DIAGNOSIS — W19XXXA Unspecified fall, initial encounter: Secondary | ICD-10-CM | POA: Diagnosis not present

## 2018-07-01 DIAGNOSIS — N390 Urinary tract infection, site not specified: Secondary | ICD-10-CM | POA: Diagnosis not present

## 2018-07-01 DIAGNOSIS — M6281 Muscle weakness (generalized): Secondary | ICD-10-CM | POA: Diagnosis not present

## 2018-07-01 DIAGNOSIS — I2510 Atherosclerotic heart disease of native coronary artery without angina pectoris: Secondary | ICD-10-CM | POA: Diagnosis not present

## 2018-07-02 DIAGNOSIS — W19XXXA Unspecified fall, initial encounter: Secondary | ICD-10-CM | POA: Diagnosis not present

## 2018-07-02 DIAGNOSIS — N390 Urinary tract infection, site not specified: Secondary | ICD-10-CM | POA: Diagnosis not present

## 2018-07-02 DIAGNOSIS — I2510 Atherosclerotic heart disease of native coronary artery without angina pectoris: Secondary | ICD-10-CM | POA: Diagnosis not present

## 2018-07-02 DIAGNOSIS — M6281 Muscle weakness (generalized): Secondary | ICD-10-CM | POA: Diagnosis not present

## 2018-07-05 DIAGNOSIS — M6281 Muscle weakness (generalized): Secondary | ICD-10-CM | POA: Diagnosis not present

## 2018-07-05 DIAGNOSIS — N390 Urinary tract infection, site not specified: Secondary | ICD-10-CM | POA: Diagnosis not present

## 2018-07-05 DIAGNOSIS — I2510 Atherosclerotic heart disease of native coronary artery without angina pectoris: Secondary | ICD-10-CM | POA: Diagnosis not present

## 2018-07-05 DIAGNOSIS — W19XXXA Unspecified fall, initial encounter: Secondary | ICD-10-CM | POA: Diagnosis not present

## 2018-07-06 DIAGNOSIS — I2510 Atherosclerotic heart disease of native coronary artery without angina pectoris: Secondary | ICD-10-CM | POA: Diagnosis not present

## 2018-07-06 DIAGNOSIS — W19XXXA Unspecified fall, initial encounter: Secondary | ICD-10-CM | POA: Diagnosis not present

## 2018-07-06 DIAGNOSIS — N390 Urinary tract infection, site not specified: Secondary | ICD-10-CM | POA: Diagnosis not present

## 2018-07-06 DIAGNOSIS — M6281 Muscle weakness (generalized): Secondary | ICD-10-CM | POA: Diagnosis not present

## 2018-07-26 DIAGNOSIS — R278 Other lack of coordination: Secondary | ICD-10-CM | POA: Diagnosis not present

## 2018-07-26 DIAGNOSIS — M6281 Muscle weakness (generalized): Secondary | ICD-10-CM | POA: Diagnosis not present

## 2018-08-01 DIAGNOSIS — I10 Essential (primary) hypertension: Secondary | ICD-10-CM | POA: Diagnosis not present

## 2018-08-01 DIAGNOSIS — E118 Type 2 diabetes mellitus with unspecified complications: Secondary | ICD-10-CM | POA: Diagnosis not present

## 2018-08-01 DIAGNOSIS — Z9114 Patient's other noncompliance with medication regimen: Secondary | ICD-10-CM | POA: Diagnosis not present

## 2018-08-01 DIAGNOSIS — I2510 Atherosclerotic heart disease of native coronary artery without angina pectoris: Secondary | ICD-10-CM | POA: Diagnosis not present

## 2018-08-03 DIAGNOSIS — E441 Mild protein-calorie malnutrition: Secondary | ICD-10-CM | POA: Diagnosis not present

## 2018-08-03 DIAGNOSIS — D72820 Lymphocytosis (symptomatic): Secondary | ICD-10-CM | POA: Diagnosis not present

## 2018-08-03 DIAGNOSIS — E0811 Diabetes mellitus due to underlying condition with ketoacidosis with coma: Secondary | ICD-10-CM | POA: Diagnosis not present

## 2018-08-11 DIAGNOSIS — M6281 Muscle weakness (generalized): Secondary | ICD-10-CM | POA: Diagnosis not present

## 2018-08-11 DIAGNOSIS — N390 Urinary tract infection, site not specified: Secondary | ICD-10-CM | POA: Diagnosis not present

## 2018-08-11 DIAGNOSIS — F0390 Unspecified dementia without behavioral disturbance: Secondary | ICD-10-CM | POA: Diagnosis not present

## 2018-08-17 DIAGNOSIS — R41841 Cognitive communication deficit: Secondary | ICD-10-CM | POA: Diagnosis not present

## 2018-08-17 DIAGNOSIS — E119 Type 2 diabetes mellitus without complications: Secondary | ICD-10-CM | POA: Diagnosis not present

## 2018-08-17 DIAGNOSIS — G9341 Metabolic encephalopathy: Secondary | ICD-10-CM | POA: Diagnosis not present

## 2018-08-17 DIAGNOSIS — M6281 Muscle weakness (generalized): Secondary | ICD-10-CM | POA: Diagnosis not present

## 2018-08-17 DIAGNOSIS — F0390 Unspecified dementia without behavioral disturbance: Secondary | ICD-10-CM | POA: Diagnosis not present

## 2018-08-17 DIAGNOSIS — I2510 Atherosclerotic heart disease of native coronary artery without angina pectoris: Secondary | ICD-10-CM | POA: Diagnosis not present

## 2018-08-22 DIAGNOSIS — I10 Essential (primary) hypertension: Secondary | ICD-10-CM | POA: Diagnosis not present

## 2018-08-22 DIAGNOSIS — I2510 Atherosclerotic heart disease of native coronary artery without angina pectoris: Secondary | ICD-10-CM | POA: Diagnosis not present

## 2018-08-22 DIAGNOSIS — E7849 Other hyperlipidemia: Secondary | ICD-10-CM | POA: Diagnosis not present

## 2018-08-22 DIAGNOSIS — E1159 Type 2 diabetes mellitus with other circulatory complications: Secondary | ICD-10-CM | POA: Diagnosis not present

## 2018-08-23 DIAGNOSIS — F0390 Unspecified dementia without behavioral disturbance: Secondary | ICD-10-CM | POA: Diagnosis not present

## 2018-08-23 DIAGNOSIS — M6281 Muscle weakness (generalized): Secondary | ICD-10-CM | POA: Diagnosis not present

## 2018-08-23 DIAGNOSIS — G9341 Metabolic encephalopathy: Secondary | ICD-10-CM | POA: Diagnosis not present

## 2018-08-23 DIAGNOSIS — E119 Type 2 diabetes mellitus without complications: Secondary | ICD-10-CM | POA: Diagnosis not present

## 2018-08-23 DIAGNOSIS — I2510 Atherosclerotic heart disease of native coronary artery without angina pectoris: Secondary | ICD-10-CM | POA: Diagnosis not present

## 2018-08-23 DIAGNOSIS — R41841 Cognitive communication deficit: Secondary | ICD-10-CM | POA: Diagnosis not present

## 2018-08-25 DIAGNOSIS — M6281 Muscle weakness (generalized): Secondary | ICD-10-CM | POA: Diagnosis not present

## 2018-08-25 DIAGNOSIS — R278 Other lack of coordination: Secondary | ICD-10-CM | POA: Diagnosis not present

## 2018-08-26 DIAGNOSIS — E7849 Other hyperlipidemia: Secondary | ICD-10-CM | POA: Diagnosis not present

## 2018-08-26 DIAGNOSIS — E1159 Type 2 diabetes mellitus with other circulatory complications: Secondary | ICD-10-CM | POA: Diagnosis not present

## 2018-08-26 DIAGNOSIS — Z9119 Patient's noncompliance with other medical treatment and regimen: Secondary | ICD-10-CM | POA: Diagnosis not present

## 2018-08-26 DIAGNOSIS — F0151 Vascular dementia with behavioral disturbance: Secondary | ICD-10-CM | POA: Diagnosis not present

## 2018-09-02 DIAGNOSIS — Z20828 Contact with and (suspected) exposure to other viral communicable diseases: Secondary | ICD-10-CM | POA: Diagnosis not present

## 2018-09-02 DIAGNOSIS — Z1383 Encounter for screening for respiratory disorder NEC: Secondary | ICD-10-CM | POA: Diagnosis not present

## 2018-09-29 DIAGNOSIS — I10 Essential (primary) hypertension: Secondary | ICD-10-CM | POA: Diagnosis not present

## 2018-09-29 DIAGNOSIS — F0150 Vascular dementia without behavioral disturbance: Secondary | ICD-10-CM | POA: Diagnosis not present

## 2018-09-29 DIAGNOSIS — F338 Other recurrent depressive disorders: Secondary | ICD-10-CM | POA: Diagnosis not present

## 2018-09-29 DIAGNOSIS — E1159 Type 2 diabetes mellitus with other circulatory complications: Secondary | ICD-10-CM | POA: Diagnosis not present

## 2018-09-29 DIAGNOSIS — I7389 Other specified peripheral vascular diseases: Secondary | ICD-10-CM | POA: Diagnosis not present

## 2018-10-10 DIAGNOSIS — Z9114 Patient's other noncompliance with medication regimen: Secondary | ICD-10-CM | POA: Diagnosis not present

## 2018-10-10 DIAGNOSIS — E1159 Type 2 diabetes mellitus with other circulatory complications: Secondary | ICD-10-CM | POA: Diagnosis not present

## 2018-10-10 DIAGNOSIS — F0151 Vascular dementia with behavioral disturbance: Secondary | ICD-10-CM | POA: Diagnosis not present

## 2018-10-10 DIAGNOSIS — E8881 Metabolic syndrome: Secondary | ICD-10-CM | POA: Diagnosis not present

## 2018-12-22 DIAGNOSIS — I2510 Atherosclerotic heart disease of native coronary artery without angina pectoris: Secondary | ICD-10-CM | POA: Diagnosis not present

## 2018-12-22 DIAGNOSIS — M6281 Muscle weakness (generalized): Secondary | ICD-10-CM | POA: Diagnosis not present

## 2018-12-27 ENCOUNTER — Telehealth: Payer: Self-pay

## 2018-12-27 NOTE — Telephone Encounter (Signed)
Received notification from [place] requesting Dr. Loanne Drilling complete DWO/PA for [facility] testing supplies. Per Dr. Loanne Drilling, unable to complete without an appt. Routing this message to the front desk for scheduling purposes.

## 2018-12-27 NOTE — Telephone Encounter (Signed)
Patient's daughter states patient now living at [facility] and believes that the facility is providing physician care but she will call the facility to find out for sure. If facility is not taking over full medical care-daughter will call our office to schedule appointment.

## 2019-01-27 DIAGNOSIS — M6281 Muscle weakness (generalized): Secondary | ICD-10-CM | POA: Diagnosis not present

## 2019-01-27 DIAGNOSIS — Z23 Encounter for immunization: Secondary | ICD-10-CM | POA: Diagnosis not present

## 2019-02-28 ENCOUNTER — Telehealth: Payer: Self-pay

## 2019-02-28 NOTE — Telephone Encounter (Signed)
error 

## 2019-06-26 IMAGING — CT CT HEAD W/O CM
3 series · 15 of 47 positions shown, 18 images · non-contrast
Comparison: 12/15/2017 and 01/06/2017 head CT. 01/06/2017 brain MR.

CLINICAL DATA: 83-year-old male with altered mental status and
lethargy for the past 10 days. Subsequent encounter.

EXAM:
CT HEAD WITHOUT CONTRAST
TECHNIQUE: Contiguous axial images were obtained from the base of the skull
through the vertex without intravenous contrast.

[Series 2: head wo · axial · 0.48mm/px · z∈[-204,-79]mm · 9 of 31 slices shown, 12 images]
[im 3/31  brain]
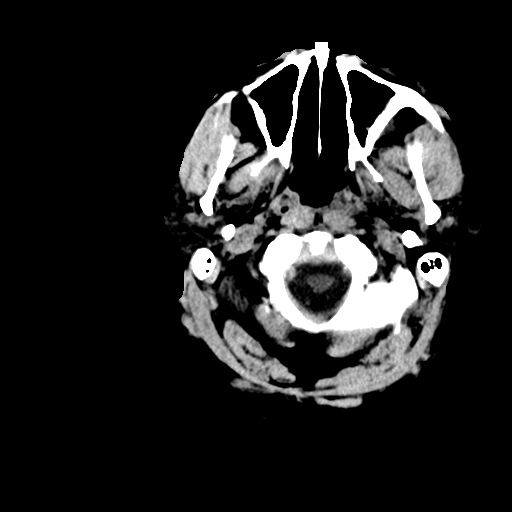
[im 3/31  bone]
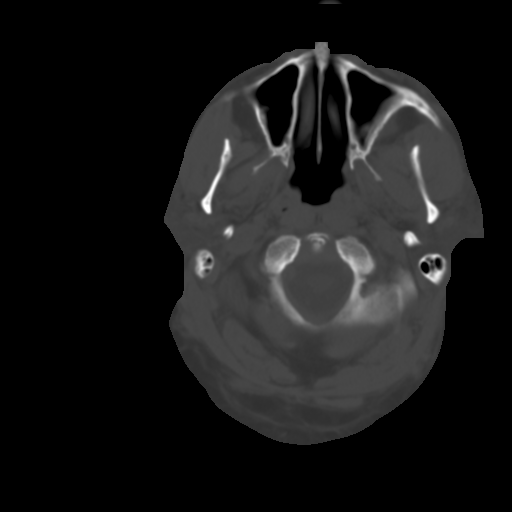
[im 6/31  brain]
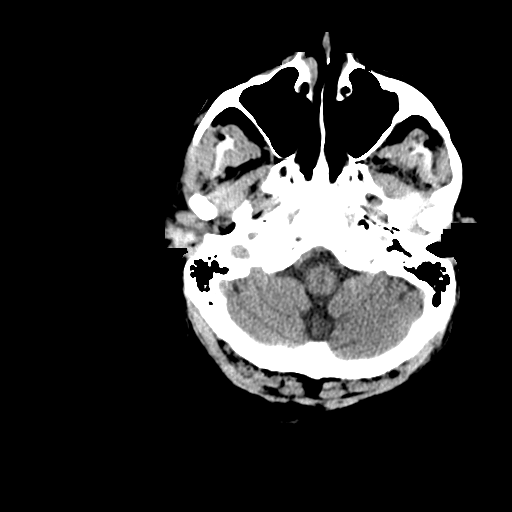
[im 9/31  brain]
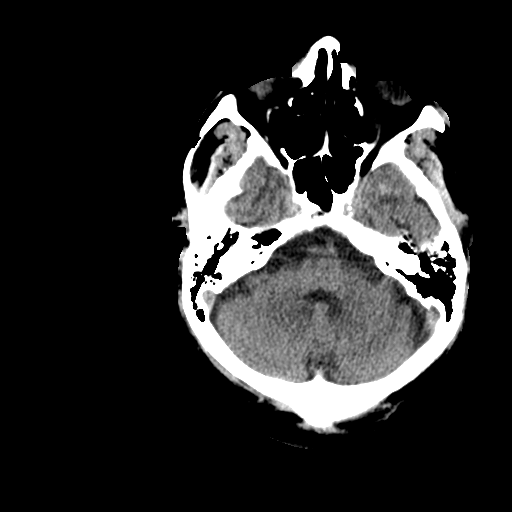
[im 12/31  brain]
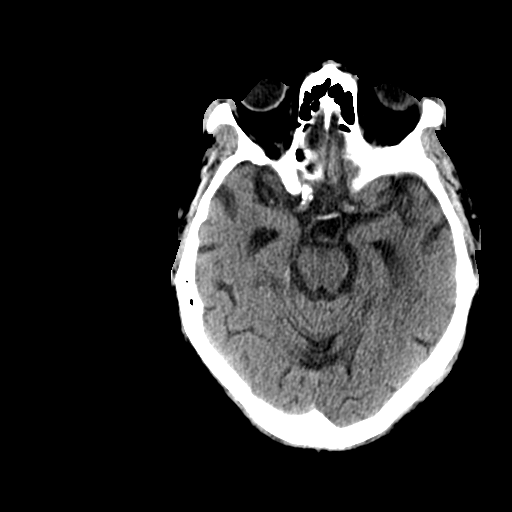
[im 16/31  brain]
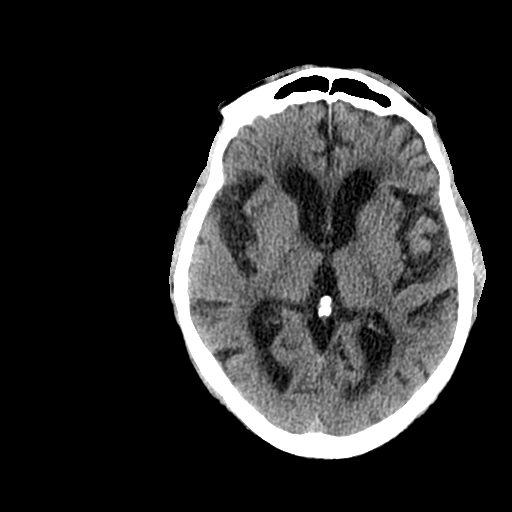
[im 16/31  bone]
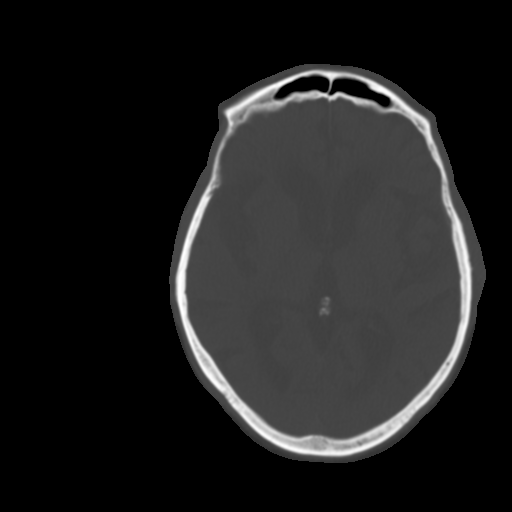
[im 19/31  brain]
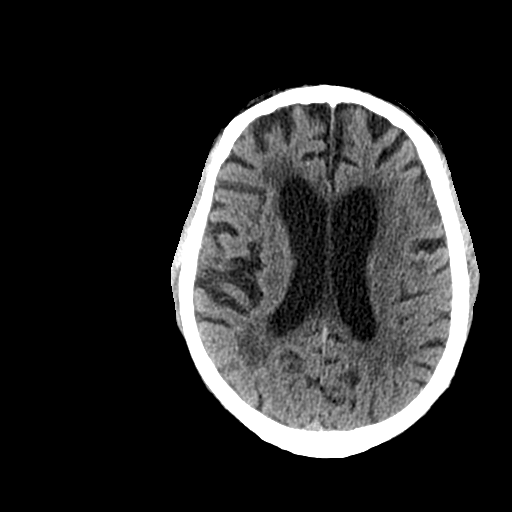
[im 22/31  brain]
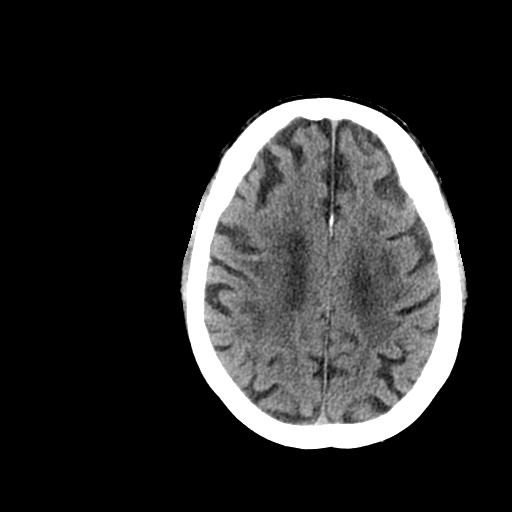
[im 25/31  brain]
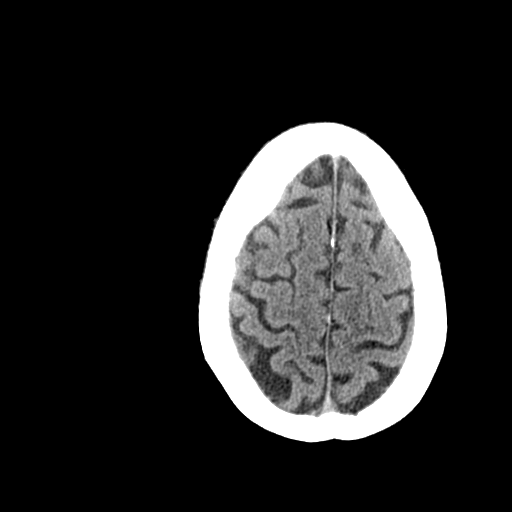
[im 28/31  brain]
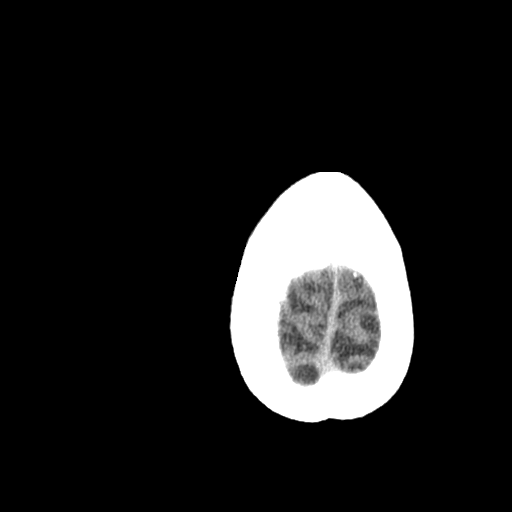
[im 28/31  bone]
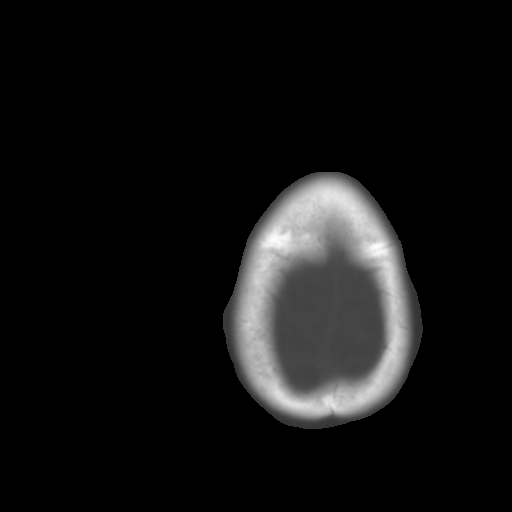

[Series 4: coronal soft tissue · coronal · 0.29mm/px · 3 of 72 slices shown]
[im 24/72  brain]
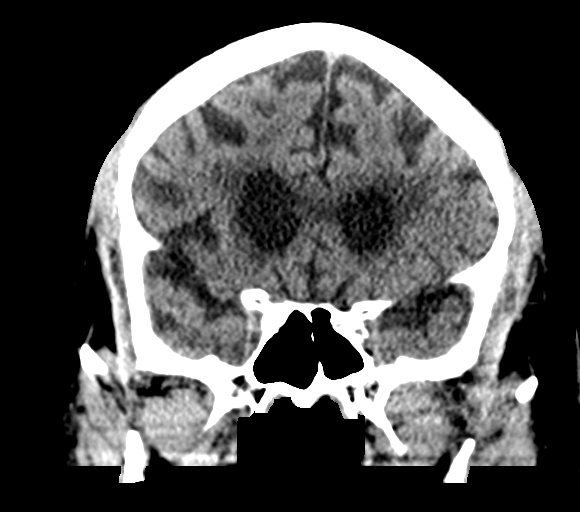
[im 32/72  brain]
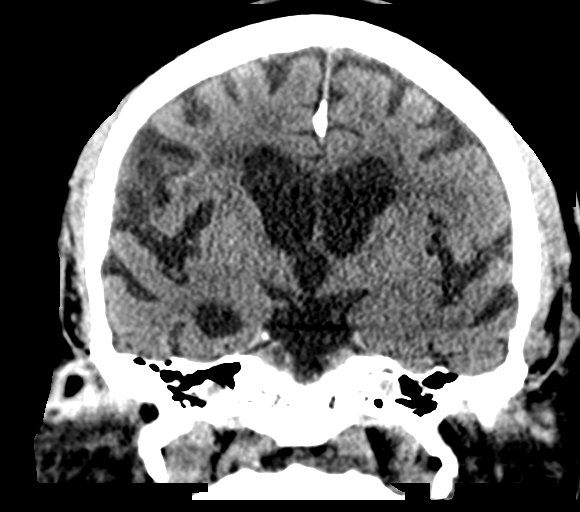
[im 40/72  brain]
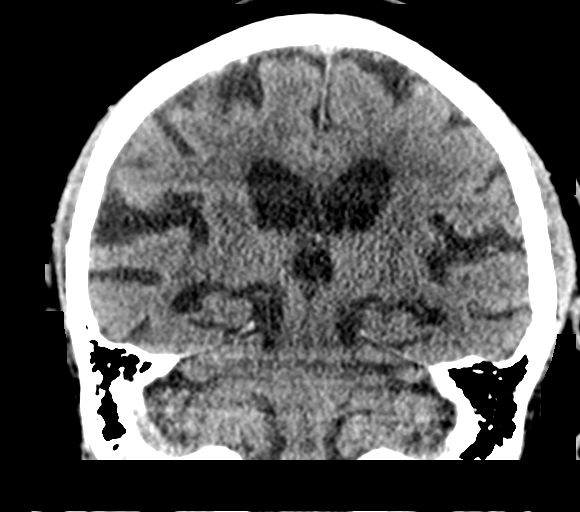

[Series 5: sagittal soft tissue · sagittal · 0.32mm/px · 3 of 58 slices shown]
[im 20/58  brain]
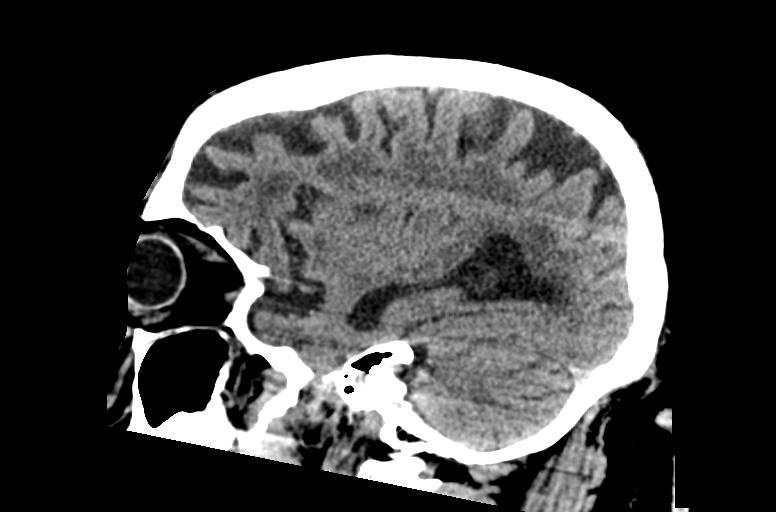
[im 29/58  brain]
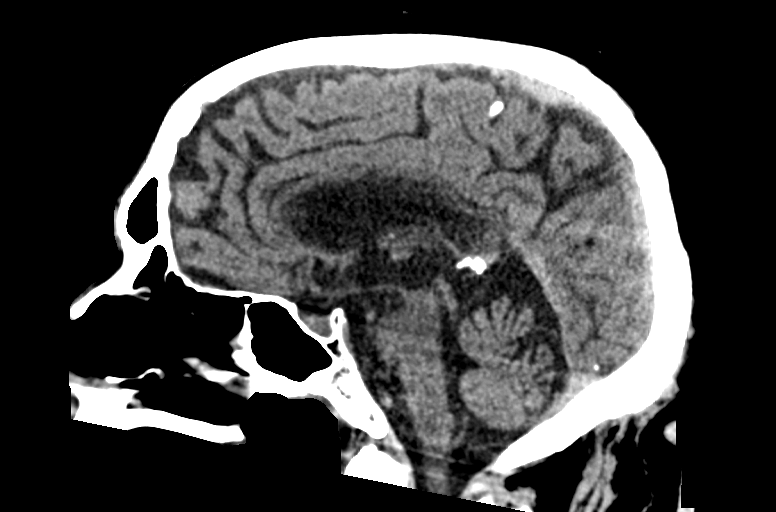
[im 39/58  brain]
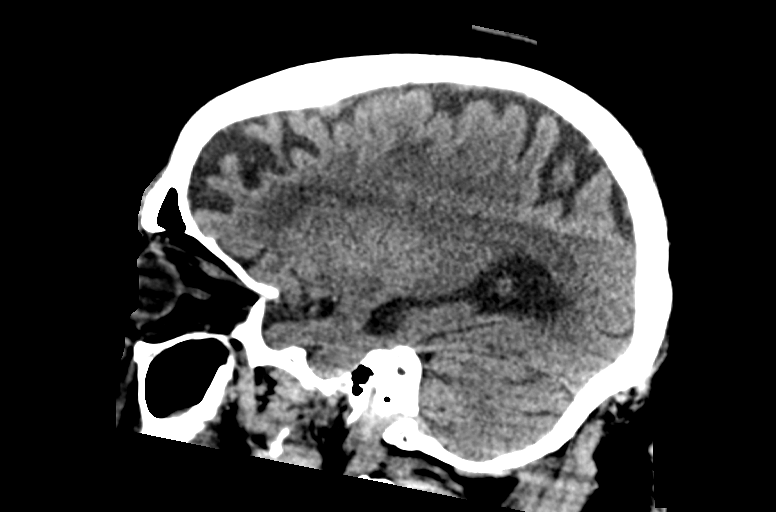

[15 of 47 positions shown; findings below may reference images not displayed]

FINDINGS: Brain: No intracranial hemorrhage or CT evidence of large acute
infarct. Chronic microvascular changes. Moderate global atrophy. No
intracranial mass lesion noted on this unenhanced exam.

Vascular: Vascular calcifications.  No hyperdense vessel.

Skull: Negative

Sinuses/Orbits: No acute orbital abnormality. Minimal mucosal
thickening inferior left maxillary sinus.

Other: Mastoid air cells and middle ear cavities are clear.
IMPRESSION: 1. No acute intracranial abnormality.
2. Chronic microvascular changes.
3. Global atrophy.

## 2019-06-26 IMAGING — CR DG CHEST 2V
2 series · 2 of 2 positions shown · non-contrast
Comparison: December 15, 2017

CLINICAL DATA: Altered mental status with confusion

EXAM:
CHEST - 2 VIEW

[w chest lat]
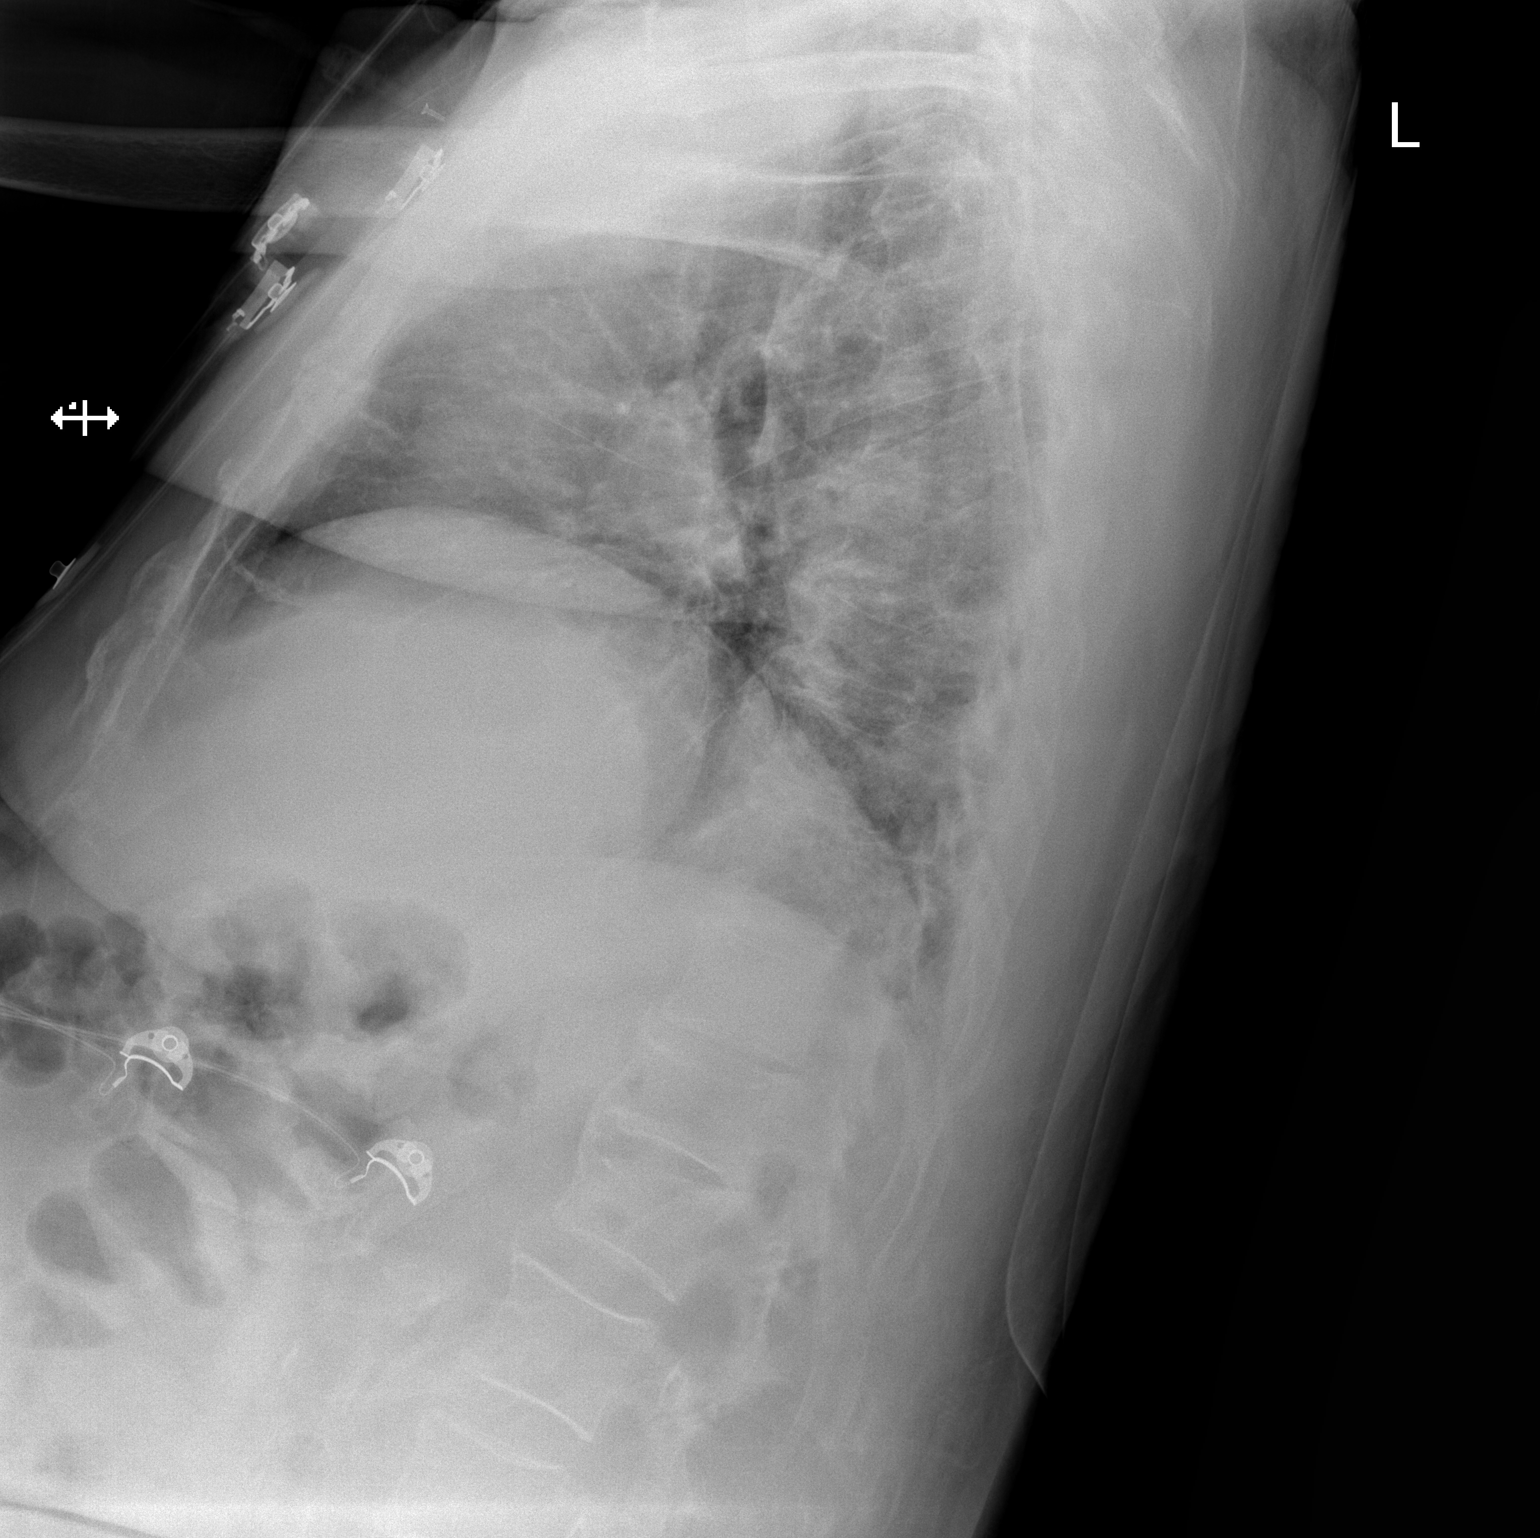

[x chest ap]
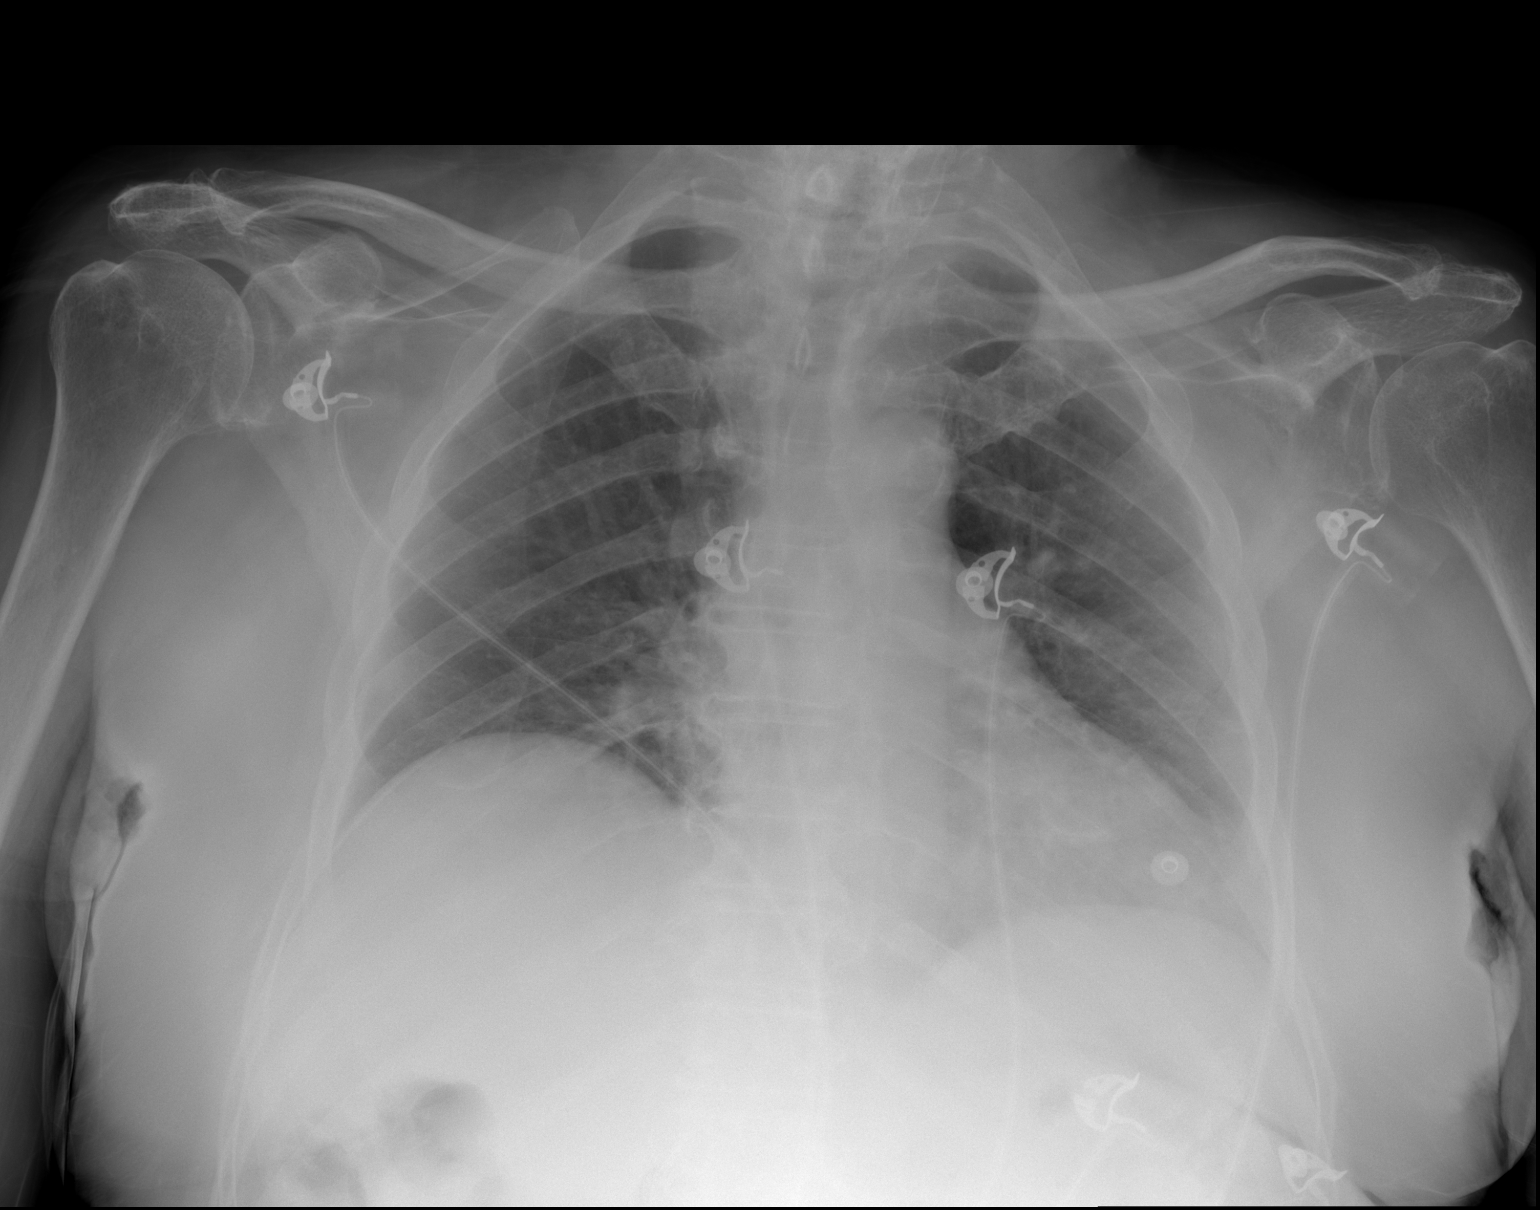

[2 of 2 positions shown; findings below may reference images not displayed]

FINDINGS: There is no edema or consolidation. Heart is upper normal in size
with pulmonary vascularity normal. No adenopathy. There is aortic
atherosclerosis. There is degenerative change in the thoracic spine
and in each shoulder.
IMPRESSION: No edema or consolidation. Heart size normal. There is aortic
atherosclerosis.

Aortic Atherosclerosis ([id number].X).

## 2019-06-28 ENCOUNTER — Telehealth: Payer: Self-pay | Admitting: Adult Health

## 2019-06-28 NOTE — Telephone Encounter (Addendum)
William Aguirre stated that the pt passed on 2022/08/05 03/17/2024and buried him March 13th. The funeral service will be held virtually tomorrow at [facility]. She wanted to let his PCP know and that she will bring the [place] by.

## 2019-07-13 DEATH — deceased
